# Patient Record
Sex: Female | Born: 1945
Health system: Southern US, Community
[De-identification: ages and names within clinical notes are randomized; demographics above are authoritative.]

## PROBLEM LIST (undated history)

## (undated) DIAGNOSIS — F0390 Unspecified dementia without behavioral disturbance: Secondary | ICD-10-CM

## (undated) DIAGNOSIS — R42 Dizziness and giddiness: Secondary | ICD-10-CM

## (undated) DIAGNOSIS — F419 Anxiety disorder, unspecified: Secondary | ICD-10-CM

## (undated) DIAGNOSIS — E7800 Pure hypercholesterolemia, unspecified: Secondary | ICD-10-CM

## (undated) HISTORY — DX: Unspecified dementia, unspecified severity, without behavioral disturbance, psychotic disturbance, mood disturbance, and anxiety: F03.90

## (undated) HISTORY — PX: TOTAL KNEE ARTHROPLASTY: SHX125

## (undated) HISTORY — PX: OTHER SURGICAL HISTORY: SHX169

## (undated) HISTORY — DX: Anxiety disorder, unspecified: F41.9

---

## 1998-02-16 ENCOUNTER — Emergency Department (HOSPITAL_COMMUNITY): Admission: EM | Admit: 1998-02-16 | Discharge: 1998-02-16 | Payer: Self-pay | Admitting: Emergency Medicine

## 1998-03-20 ENCOUNTER — Emergency Department (HOSPITAL_COMMUNITY): Admission: EM | Admit: 1998-03-20 | Discharge: 1998-03-20 | Payer: Self-pay | Admitting: Emergency Medicine

## 1999-12-11 ENCOUNTER — Emergency Department (HOSPITAL_COMMUNITY): Admission: EM | Admit: 1999-12-11 | Discharge: 1999-12-11 | Payer: Self-pay | Admitting: Emergency Medicine

## 2000-07-17 ENCOUNTER — Encounter: Payer: Self-pay | Admitting: Family Medicine

## 2000-07-17 ENCOUNTER — Encounter: Admission: RE | Admit: 2000-07-17 | Discharge: 2000-07-17 | Payer: Self-pay | Admitting: Family Medicine

## 2001-09-03 ENCOUNTER — Other Ambulatory Visit: Admission: RE | Admit: 2001-09-03 | Discharge: 2001-09-03 | Payer: Self-pay | Admitting: Family Medicine

## 2001-09-11 ENCOUNTER — Encounter: Payer: Self-pay | Admitting: Family Medicine

## 2001-09-11 ENCOUNTER — Encounter: Admission: RE | Admit: 2001-09-11 | Discharge: 2001-09-11 | Payer: Self-pay | Admitting: Family Medicine

## 2002-10-09 ENCOUNTER — Encounter: Admission: RE | Admit: 2002-10-09 | Discharge: 2002-10-09 | Payer: Self-pay | Admitting: Family Medicine

## 2002-10-09 ENCOUNTER — Encounter: Payer: Self-pay | Admitting: Family Medicine

## 2003-08-13 ENCOUNTER — Encounter: Admission: RE | Admit: 2003-08-13 | Discharge: 2003-08-13 | Payer: Self-pay | Admitting: Family Medicine

## 2003-10-17 ENCOUNTER — Encounter: Admission: RE | Admit: 2003-10-17 | Discharge: 2003-10-17 | Payer: Self-pay | Admitting: Family Medicine

## 2004-03-11 ENCOUNTER — Emergency Department (HOSPITAL_COMMUNITY): Admission: EM | Admit: 2004-03-11 | Discharge: 2004-03-11 | Payer: Self-pay | Admitting: Family Medicine

## 2004-10-01 ENCOUNTER — Other Ambulatory Visit: Admission: RE | Admit: 2004-10-01 | Discharge: 2004-10-01 | Payer: Self-pay | Admitting: Family Medicine

## 2004-10-25 ENCOUNTER — Encounter: Admission: RE | Admit: 2004-10-25 | Discharge: 2004-10-25 | Payer: Self-pay | Admitting: Family Medicine

## 2005-10-17 ENCOUNTER — Encounter: Admission: RE | Admit: 2005-10-17 | Discharge: 2005-10-17 | Payer: Self-pay | Admitting: Family Medicine

## 2005-10-31 ENCOUNTER — Encounter: Admission: RE | Admit: 2005-10-31 | Discharge: 2005-10-31 | Payer: Self-pay | Admitting: Family Medicine

## 2005-11-04 ENCOUNTER — Encounter: Admission: RE | Admit: 2005-11-04 | Discharge: 2005-11-04 | Payer: Self-pay | Admitting: Family Medicine

## 2005-11-14 ENCOUNTER — Encounter: Admission: RE | Admit: 2005-11-14 | Discharge: 2005-11-14 | Payer: Self-pay | Admitting: Family Medicine

## 2006-03-23 ENCOUNTER — Emergency Department (HOSPITAL_COMMUNITY): Admission: EM | Admit: 2006-03-23 | Discharge: 2006-03-23 | Payer: Self-pay | Admitting: Emergency Medicine

## 2006-11-13 ENCOUNTER — Encounter: Admission: RE | Admit: 2006-11-13 | Discharge: 2006-11-13 | Payer: Self-pay | Admitting: Family Medicine

## 2007-11-14 ENCOUNTER — Encounter: Admission: RE | Admit: 2007-11-14 | Discharge: 2007-11-14 | Payer: Self-pay | Admitting: Family Medicine

## 2008-05-08 ENCOUNTER — Encounter: Admission: RE | Admit: 2008-05-08 | Discharge: 2008-05-08 | Payer: Self-pay | Admitting: Family Medicine

## 2008-06-11 ENCOUNTER — Ambulatory Visit (HOSPITAL_COMMUNITY): Admission: RE | Admit: 2008-06-11 | Discharge: 2008-06-11 | Payer: Self-pay | Admitting: Gastroenterology

## 2008-10-20 ENCOUNTER — Other Ambulatory Visit: Admission: RE | Admit: 2008-10-20 | Discharge: 2008-10-20 | Payer: Self-pay | Admitting: Family Medicine

## 2008-11-24 ENCOUNTER — Emergency Department (HOSPITAL_COMMUNITY): Admission: EM | Admit: 2008-11-24 | Discharge: 2008-11-24 | Payer: Self-pay | Admitting: Emergency Medicine

## 2008-12-19 ENCOUNTER — Encounter: Admission: RE | Admit: 2008-12-19 | Discharge: 2008-12-19 | Payer: Self-pay | Admitting: Family Medicine

## 2009-03-03 ENCOUNTER — Encounter: Admission: RE | Admit: 2009-03-03 | Discharge: 2009-03-03 | Payer: Self-pay | Admitting: Family Medicine

## 2009-10-18 ENCOUNTER — Emergency Department (HOSPITAL_COMMUNITY): Admission: EM | Admit: 2009-10-18 | Discharge: 2009-10-18 | Payer: Self-pay | Admitting: Family Medicine

## 2009-10-21 ENCOUNTER — Other Ambulatory Visit: Admission: RE | Admit: 2009-10-21 | Discharge: 2009-10-21 | Payer: Self-pay | Admitting: Family Medicine

## 2010-02-05 ENCOUNTER — Inpatient Hospital Stay (HOSPITAL_COMMUNITY): Admission: RE | Admit: 2010-02-05 | Discharge: 2010-02-08 | Payer: Self-pay | Admitting: Orthopaedic Surgery

## 2010-05-19 ENCOUNTER — Encounter: Admission: RE | Admit: 2010-05-19 | Discharge: 2010-05-19 | Payer: Self-pay | Admitting: Family Medicine

## 2010-09-05 ENCOUNTER — Encounter: Payer: Self-pay | Admitting: Family Medicine

## 2010-09-05 ENCOUNTER — Encounter: Payer: Self-pay | Admitting: Gastroenterology

## 2010-10-31 LAB — BASIC METABOLIC PANEL
BUN: 11 mg/dL (ref 6–23)
BUN: 13 mg/dL (ref 6–23)
CO2: 23 mEq/L (ref 19–32)
CO2: 27 mEq/L (ref 19–32)
Calcium: 8.4 mg/dL (ref 8.4–10.5)
Calcium: 9.1 mg/dL (ref 8.4–10.5)
Chloride: 101 mEq/L (ref 96–112)
Chloride: 105 mEq/L (ref 96–112)
Creatinine, Ser: 1.25 mg/dL — ABNORMAL HIGH (ref 0.4–1.2)
Creatinine, Ser: 1.33 mg/dL — ABNORMAL HIGH (ref 0.4–1.2)
GFR calc Af Amer: 49 mL/min — ABNORMAL LOW (ref >60)
GFR calc Af Amer: 52 mL/min — ABNORMAL LOW (ref >60)
GFR calc non Af Amer: 40 mL/min — ABNORMAL LOW (ref >60)
GFR calc non Af Amer: 43 mL/min — ABNORMAL LOW (ref >60)
Glucose, Bld: 122 mg/dL — ABNORMAL HIGH (ref 70–99)
Glucose, Bld: 161 mg/dL — ABNORMAL HIGH (ref 70–99)
Potassium: 4.2 mEq/L (ref 3.5–5.1)
Potassium: 4.2 mEq/L (ref 3.5–5.1)
Sodium: 133 mEq/L — ABNORMAL LOW (ref 135–145)
Sodium: 137 mEq/L (ref 135–145)

## 2010-10-31 LAB — URINALYSIS, ROUTINE W REFLEX MICROSCOPIC
Bilirubin Urine: NEGATIVE
Glucose, UA: NEGATIVE mg/dL
Hgb urine dipstick: NEGATIVE
Ketones, ur: NEGATIVE mg/dL
Nitrite: NEGATIVE
Protein, ur: NEGATIVE mg/dL
Specific Gravity, Urine: 1.007 (ref 1.005–1.030)
Urobilinogen, UA: 0.2 mg/dL (ref 0.0–1.0)
pH: 7 (ref 5.0–8.0)

## 2010-10-31 LAB — COMPREHENSIVE METABOLIC PANEL
ALT: 30 U/L (ref 0–35)
AST: 26 U/L (ref 0–37)
Albumin: 4 g/dL (ref 3.5–5.2)
Alkaline Phosphatase: 80 U/L (ref 39–117)
BUN: 12 mg/dL (ref 6–23)
CO2: 31 mEq/L (ref 19–32)
Calcium: 10 mg/dL (ref 8.4–10.5)
Chloride: 107 mEq/L (ref 96–112)
Creatinine, Ser: 0.81 mg/dL (ref 0.4–1.2)
GFR calc Af Amer: >60 mL/min (ref >60)
GFR calc non Af Amer: >60 mL/min (ref >60)
Glucose, Bld: 94 mg/dL (ref 70–99)
Potassium: 4.1 mEq/L (ref 3.5–5.1)
Sodium: 141 mEq/L (ref 135–145)
Total Bilirubin: 0.6 mg/dL (ref 0.3–1.2)
Total Protein: 7.1 g/dL (ref 6.0–8.3)

## 2010-10-31 LAB — PROTIME-INR
INR: 0.92 (ref 0.00–1.49)
INR: 1.14 (ref 0.00–1.49)
INR: 1.34 (ref 0.00–1.49)
INR: 1.6 — ABNORMAL HIGH (ref 0.00–1.49)
Prothrombin Time: 12.3 seconds (ref 11.6–15.2)
Prothrombin Time: 14.5 seconds (ref 11.6–15.2)
Prothrombin Time: 16.5 seconds — ABNORMAL HIGH (ref 11.6–15.2)
Prothrombin Time: 18.9 seconds — ABNORMAL HIGH (ref 11.6–15.2)

## 2010-10-31 LAB — CBC
HCT: 27.4 % — ABNORMAL LOW (ref 36.0–46.0)
HCT: 32.3 % — ABNORMAL LOW (ref 36.0–46.0)
HCT: 41.5 % (ref 36.0–46.0)
Hemoglobin: 11.1 g/dL — ABNORMAL LOW (ref 12.0–15.0)
Hemoglobin: 14.2 g/dL (ref 12.0–15.0)
Hemoglobin: 9.4 g/dL — ABNORMAL LOW (ref 12.0–15.0)
MCH: 31.9 pg (ref 26.0–34.0)
MCH: 32.1 pg (ref 26.0–34.0)
MCHC: 34.2 g/dL (ref 30.0–36.0)
MCHC: 34.3 g/dL (ref 30.0–36.0)
MCHC: 34.5 g/dL (ref 30.0–36.0)
MCV: 92.2 fL (ref 78.0–100.0)
MCV: 93 fL (ref 78.0–100.0)
MCV: 93.2 fL (ref 78.0–100.0)
Platelets: 174 10*3/uL (ref 150–400)
Platelets: 224 10*3/uL (ref 150–400)
Platelets: 271 10*3/uL (ref 150–400)
RBC: 2.95 MIL/uL — ABNORMAL LOW (ref 3.87–5.11)
RBC: 3.47 MIL/uL — ABNORMAL LOW (ref 3.87–5.11)
RBC: 4.51 MIL/uL (ref 3.87–5.11)
RDW: 13 % (ref 11.5–15.5)
RDW: 13.2 % (ref 11.5–15.5)
RDW: 13.5 % (ref 11.5–15.5)
WBC: 10.4 10*3/uL (ref 4.0–10.5)
WBC: 11.5 10*3/uL — ABNORMAL HIGH (ref 4.0–10.5)
WBC: 5.4 10*3/uL (ref 4.0–10.5)

## 2010-10-31 LAB — DIFFERENTIAL
Basophils Absolute: 0 10*3/uL (ref 0.0–0.1)
Basophils Relative: 0 % (ref 0–1)
Eosinophils Absolute: 0.1 10*3/uL (ref 0.0–0.7)
Eosinophils Relative: 2 % (ref 0–5)
Lymphocytes Relative: 39 % (ref 12–46)
Lymphs Abs: 2.1 10*3/uL (ref 0.7–4.0)
Monocytes Absolute: 0.5 10*3/uL (ref 0.1–1.0)
Monocytes Relative: 9 % (ref 3–12)
Neutro Abs: 2.6 10*3/uL (ref 1.7–7.7)
Neutrophils Relative %: 49 % (ref 43–77)

## 2010-10-31 LAB — URINE MICROSCOPIC-ADD ON

## 2010-10-31 LAB — TYPE AND SCREEN
ABO/RH(D): A POS
Antibody Screen: NEGATIVE

## 2010-10-31 LAB — SURGICAL PCR SCREEN
MRSA, PCR: NEGATIVE
Staphylococcus aureus: POSITIVE — AB

## 2010-10-31 LAB — ABO/RH: ABO/RH(D): A POS

## 2010-11-24 LAB — COMPREHENSIVE METABOLIC PANEL
ALT: 31 U/L (ref 0–35)
AST: 24 U/L (ref 0–37)
Albumin: 3.8 g/dL (ref 3.5–5.2)
Alkaline Phosphatase: 56 U/L (ref 39–117)
BUN: 12 mg/dL (ref 6–23)
CO2: 27 mEq/L (ref 19–32)
Calcium: 9.5 mg/dL (ref 8.4–10.5)
Chloride: 106 mEq/L (ref 96–112)
Creatinine, Ser: 0.79 mg/dL (ref 0.4–1.2)
GFR calc Af Amer: >60 mL/min (ref >60)
GFR calc non Af Amer: >60 mL/min (ref >60)
Glucose, Bld: 104 mg/dL — ABNORMAL HIGH (ref 70–99)
Potassium: 3.3 mEq/L — ABNORMAL LOW (ref 3.5–5.1)
Sodium: 139 mEq/L (ref 135–145)
Total Bilirubin: 0.5 mg/dL (ref 0.3–1.2)
Total Protein: 6.8 g/dL (ref 6.0–8.3)

## 2010-11-24 LAB — URINALYSIS, ROUTINE W REFLEX MICROSCOPIC
Bilirubin Urine: NEGATIVE
Glucose, UA: NEGATIVE mg/dL
Hgb urine dipstick: NEGATIVE
Ketones, ur: NEGATIVE mg/dL
Nitrite: NEGATIVE
Protein, ur: NEGATIVE mg/dL
Specific Gravity, Urine: 1.028 (ref 1.005–1.030)
Urobilinogen, UA: 1 mg/dL (ref 0.0–1.0)
pH: 6 (ref 5.0–8.0)

## 2010-11-24 LAB — CBC
HCT: 38.5 % (ref 36.0–46.0)
Hemoglobin: 13.1 g/dL (ref 12.0–15.0)
MCHC: 34 g/dL (ref 30.0–36.0)
MCV: 93 fL (ref 78.0–100.0)
Platelets: 250 10*3/uL (ref 150–400)
RBC: 4.14 MIL/uL (ref 3.87–5.11)
RDW: 13 % (ref 11.5–15.5)
WBC: 9.2 10*3/uL (ref 4.0–10.5)

## 2010-11-24 LAB — DIFFERENTIAL
Basophils Absolute: 0 10*3/uL (ref 0.0–0.1)
Basophils Relative: 0 % (ref 0–1)
Eosinophils Absolute: 0.1 10*3/uL (ref 0.0–0.7)
Eosinophils Relative: 1 % (ref 0–5)
Lymphocytes Relative: 17 % (ref 12–46)
Lymphs Abs: 1.5 10*3/uL (ref 0.7–4.0)
Monocytes Absolute: 0.6 10*3/uL (ref 0.1–1.0)
Monocytes Relative: 6 % (ref 3–12)
Neutro Abs: 6.9 10*3/uL (ref 1.7–7.7)
Neutrophils Relative %: 76 % (ref 43–77)

## 2010-11-24 LAB — LIPASE, BLOOD: Lipase: 38 U/L (ref 11–59)

## 2011-01-11 ENCOUNTER — Emergency Department (HOSPITAL_COMMUNITY)
Admission: EM | Admit: 2011-01-11 | Discharge: 2011-01-11 | Disposition: A | Discharge status: Home / Self Care | Payer: Medicare Other | Attending: Emergency Medicine | Admitting: Emergency Medicine

## 2011-01-11 DIAGNOSIS — R0989 Other specified symptoms and signs involving the circulatory and respiratory systems: Secondary | ICD-10-CM | POA: Insufficient documentation

## 2011-01-11 DIAGNOSIS — R0609 Other forms of dyspnea: Secondary | ICD-10-CM | POA: Insufficient documentation

## 2011-01-11 DIAGNOSIS — F341 Dysthymic disorder: Secondary | ICD-10-CM | POA: Insufficient documentation

## 2011-01-11 DIAGNOSIS — R42 Dizziness and giddiness: Secondary | ICD-10-CM | POA: Insufficient documentation

## 2011-01-11 DIAGNOSIS — F410 Panic disorder [episodic paroxysmal anxiety] without agoraphobia: Secondary | ICD-10-CM | POA: Insufficient documentation

## 2011-01-11 DIAGNOSIS — K219 Gastro-esophageal reflux disease without esophagitis: Secondary | ICD-10-CM | POA: Insufficient documentation

## 2011-01-11 LAB — CK TOTAL AND CKMB (NOT AT ARMC)
CK, MB: 1.5 ng/mL (ref 0.3–4.0)
Relative Index: INVALID (ref 0.0–2.5)
Total CK: 61 U/L (ref 7–177)

## 2011-01-11 LAB — TROPONIN I: Troponin I: <0.3 ng/mL (ref <0.30)

## 2011-04-29 ENCOUNTER — Other Ambulatory Visit: Payer: Self-pay | Admitting: Family Medicine

## 2011-04-29 DIAGNOSIS — Z1231 Encounter for screening mammogram for malignant neoplasm of breast: Secondary | ICD-10-CM

## 2011-05-23 ENCOUNTER — Ambulatory Visit
Admission: RE | Admit: 2011-05-23 | Discharge: 2011-05-23 | Disposition: A | Discharge status: Home / Self Care | Payer: Medicare Other | Source: Ambulatory Visit | Attending: Family Medicine | Admitting: Family Medicine

## 2011-05-23 DIAGNOSIS — Z1231 Encounter for screening mammogram for malignant neoplasm of breast: Secondary | ICD-10-CM

## 2011-05-31 ENCOUNTER — Other Ambulatory Visit: Payer: Self-pay | Admitting: Family Medicine

## 2011-05-31 DIAGNOSIS — R928 Other abnormal and inconclusive findings on diagnostic imaging of breast: Secondary | ICD-10-CM

## 2011-06-07 ENCOUNTER — Ambulatory Visit
Admission: RE | Admit: 2011-06-07 | Discharge: 2011-06-07 | Disposition: A | Discharge status: Home / Self Care | Payer: Medicare Other | Source: Ambulatory Visit | Attending: Family Medicine | Admitting: Family Medicine

## 2011-06-07 DIAGNOSIS — R928 Other abnormal and inconclusive findings on diagnostic imaging of breast: Secondary | ICD-10-CM

## 2011-06-10 ENCOUNTER — Other Ambulatory Visit: Payer: Medicare Other

## 2012-03-22 ENCOUNTER — Encounter (HOSPITAL_COMMUNITY): Payer: Self-pay | Admitting: Emergency Medicine

## 2012-03-22 ENCOUNTER — Emergency Department (HOSPITAL_COMMUNITY)
Admission: EM | Admit: 2012-03-22 | Discharge: 2012-03-23 | Disposition: A | Discharge status: Home / Self Care | Payer: Medicare Other | Attending: Emergency Medicine | Admitting: Emergency Medicine

## 2012-03-22 DIAGNOSIS — R42 Dizziness and giddiness: Secondary | ICD-10-CM | POA: Insufficient documentation

## 2012-03-22 DIAGNOSIS — R1110 Vomiting, unspecified: Secondary | ICD-10-CM | POA: Insufficient documentation

## 2012-03-22 DIAGNOSIS — Z880 Allergy status to penicillin: Secondary | ICD-10-CM | POA: Insufficient documentation

## 2012-03-22 DIAGNOSIS — Z885 Allergy status to narcotic agent status: Secondary | ICD-10-CM | POA: Insufficient documentation

## 2012-03-22 HISTORY — DX: Dizziness and giddiness: R42

## 2012-03-22 HISTORY — DX: Pure hypercholesterolemia, unspecified: E78.00

## 2012-03-22 MED ORDER — ONDANSETRON HCL 4 MG/2ML IJ SOLN
INTRAMUSCULAR | Status: AC
  Administered 2012-03-22: 4 mg via INTRAVENOUS
  Filled 2012-03-22: qty 2

## 2012-03-22 MED ORDER — ONDANSETRON HCL 4 MG/2ML IJ SOLN
4.0000 mg | Freq: Once | INTRAMUSCULAR | Status: AC
  Administered 2012-03-22: 4 mg via INTRAVENOUS

## 2012-03-22 MED ORDER — ONDANSETRON HCL 4 MG/2ML IJ SOLN
INTRAMUSCULAR | Status: AC
  Administered 2012-03-22
  Filled 2012-03-22: qty 2

## 2012-03-22 NOTE — ED Notes (Signed)
Per EMS pt is c/o dizziness and vomiting  Pt states she woke up that way  Pt has hx of vertigo  Denies pain anywhere

## 2012-03-23 LAB — POCT I-STAT, CHEM 8
BUN: 15 mg/dL (ref 6–23)
Calcium, Ion: 1.29 mmol/L (ref 1.13–1.30)
Chloride: 108 mEq/L (ref 96–112)
Creatinine, Ser: 0.9 mg/dL (ref 0.50–1.10)
Glucose, Bld: 126 mg/dL — ABNORMAL HIGH (ref 70–99)
HCT: 36 % (ref 36.0–46.0)
Hemoglobin: 12.2 g/dL (ref 12.0–15.0)
Potassium: 3.4 mEq/L — ABNORMAL LOW (ref 3.5–5.1)
Sodium: 142 mEq/L (ref 135–145)
TCO2: 21 mmol/L (ref 0–100)

## 2012-03-23 LAB — CBC
HCT: 35.6 % — ABNORMAL LOW (ref 36.0–46.0)
Hemoglobin: 12.2 g/dL (ref 12.0–15.0)
MCH: 30.7 pg (ref 26.0–34.0)
MCHC: 34.3 g/dL (ref 30.0–36.0)
MCV: 89.7 fL (ref 78.0–100.0)
Platelets: 229 10*3/uL (ref 150–400)
RBC: 3.97 MIL/uL (ref 3.87–5.11)
RDW: 13.1 % (ref 11.5–15.5)
WBC: 8 10*3/uL (ref 4.0–10.5)

## 2012-03-23 MED ORDER — MECLIZINE HCL 25 MG PO TABS
25.0000 mg | ORAL_TABLET | Freq: Once | ORAL | Status: AC
  Administered 2012-03-23: 25 mg via ORAL
  Filled 2012-03-23: qty 1

## 2012-03-23 MED ORDER — LORAZEPAM 2 MG/ML IJ SOLN
1.0000 mg | Freq: Once | INTRAMUSCULAR | Status: AC
  Administered 2012-03-23: 1 mg via INTRAVENOUS
  Filled 2012-03-23: qty 1

## 2012-03-23 MED ORDER — POTASSIUM CHLORIDE CRYS ER 20 MEQ PO TBCR
20.0000 meq | EXTENDED_RELEASE_TABLET | Freq: Once | ORAL | Status: AC
  Administered 2012-03-23: 20 meq via ORAL
  Filled 2012-03-23: qty 1

## 2012-03-23 MED ORDER — MECLIZINE HCL 25 MG PO TABS: 25.0000 mg | ORAL_TABLET | Freq: Four times a day (QID) | ORAL | Status: AC

## 2012-03-23 MED ORDER — SODIUM CHLORIDE 0.9 % IV SOLN
INTRAVENOUS | Status: DC
  Administered 2012-03-23 (×2): via INTRAVENOUS

## 2012-03-23 MED ORDER — ONDANSETRON HCL 4 MG/2ML IJ SOLN
4.0000 mg | Freq: Once | INTRAMUSCULAR | Status: AC
  Administered 2012-03-23: 4 mg via INTRAVENOUS
  Filled 2012-03-23: qty 2

## 2012-03-23 NOTE — ED Provider Notes (Signed)
History     CSN: [id number]  Arrival date & time 03/22/12  2316   First MD Initiated Contact with Patient 03/23/12 0004      Chief Complaint  Patient presents with  . Dizziness  . Emesis    (Consider location/radiation/quality/duration/timing/severity/associated sxs/prior treatment) HPI  Hx per PT, got up to use the restroom and had sudden onset severe dizzieness and associated N/V. No HA. No weakness or numbness. No F/C. No recent illness, no neck pain. H/o same a few months ago and Dx with vertigo. Non smoker, no h/o HTN and no h/o CVA. No known alleviating symptoms.  Past Medical History  Diagnosis Date  . Vertigo   . High cholesterol     No past surgical history on file.  No family history on file.  History  Substance Use Topics  . Smoking status: Not on file  . Smokeless tobacco: Not on file  . Alcohol Use:     OB History    Grav Para Term Preterm Abortions TAB SAB Ect Mult Living                  Review of Systems  Constitutional: Negative for fever and chills.  HENT: Negative for neck pain and neck stiffness.   Eyes: Negative for pain.  Respiratory: Negative for shortness of breath.   Cardiovascular: Negative for chest pain.  Gastrointestinal: Positive for nausea and vomiting. Negative for abdominal pain.  Genitourinary: Negative for dysuria.  Musculoskeletal: Negative for back pain.  Skin: Negative for rash.  Neurological: Positive for dizziness. Negative for headaches.  All other systems reviewed and are negative.    Allergies  Azithromycin; Codeine; and Penicillins  Home Medications   Current Outpatient Rx  Name Route Sig Dispense Refill  . ALPRAZOLAM 0.5 MG PO TABS Oral Take 1 mg by mouth at bedtime as needed. For sleep    . LOVASTATIN 40 MG PO TABS Oral Take 80 mg by mouth daily.      BP 128/75  Pulse 74  Temp 98.4 F (36.9 C) (Oral)  Resp 21  SpO2 97%  Physical Exam  Constitutional: She is oriented to person, place, and time. She  appears well-developed and well-nourished.  HENT:  Head: Normocephalic and atraumatic.  Eyes: Conjunctivae and EOM are normal. Pupils are equal, round, and reactive to light.  Neck: Full passive range of motion without pain. Neck supple. No thyromegaly present.       No meningismus  Cardiovascular: Normal rate, regular rhythm, S1 normal, S2 normal and intact distal pulses.   Pulmonary/Chest: Effort normal and breath sounds normal.  Abdominal: Soft. Bowel sounds are normal. There is no tenderness. There is no CVA tenderness.  Musculoskeletal: Normal range of motion.  Neurological: She is alert and oriented to person, place, and time. She has normal strength and normal reflexes. No cranial nerve deficit or sensory deficit. She displays a negative Romberg sign. GCS eye subscore is 4. GCS verbal subscore is 5. GCS motor subscore is 6.       Lateral nystagmus present  Skin: Skin is warm and dry. No rash noted. No cyanosis. Nails show no clubbing.  Psychiatric: She has a normal mood and affect. Her speech is normal and behavior is normal.    ED Course  Procedures (including critical care time)  Results for orders placed during the hospital encounter of 03/22/12  CBC      Component Value Range   WBC 8.0  4.0 - 10.5 K/uL  RBC 3.97  3.87 - 5.11 MIL/uL   Hemoglobin 12.2  12.0 - 15.0 g/dL   HCT 16.1 (*) 09.6 - 04.5 %   MCV 89.7  78.0 - 100.0 fL   MCH 30.7  26.0 - 34.0 pg   MCHC 34.3  30.0 - 36.0 g/dL   RDW 40.9  81.1 - 91.4 %   Platelets 229  150 - 400 K/uL  POCT I-STAT, CHEM 8      Component Value Range   Sodium 142  135 - 145 mEq/L   Potassium 3.4 (*) 3.5 - 5.1 mEq/L   Chloride 108  96 - 112 mEq/L   BUN 15  6 - 23 mg/dL   Creatinine, Ser 7.82  0.50 - 1.10 mg/dL   Glucose, Bld 956 (*) 70 - 99 mg/dL   Calcium, Ion 2.13  0.86 - 1.30 mmol/L   TCO2 21  0 - 100 mmol/L   Hemoglobin 12.2  12.0 - 15.0 g/dL   HCT 57.8  46.9 - 62.9 %   IVFs. IV ativan and zofran.  antivert PO.   2:32 AM  recheck significantly better, tolerates POs, ambulates to the bathroom. No further symptoms, repeat exam normal neuro exam.   PT requesting to be discharged home, feels much better. Given normal neuro exam and symptoms resolved, and no risk factors for stroke, do not feel PT requires admit for the same at this time. She has close PCP follow up, is a reliable historian and agree to all d/c and f/u instructions, as well as, strict return precautions.  MDM   Nursing notes reviewed, VS reviewed. IVFs and medications and improved condition with serial evaluations.         Sunnie Nielsen, MD 03/23/12 [phone]

## 2012-03-23 NOTE — ED Notes (Signed)
Pt ambulated without difficulty, able to tolerate PO fluids.

## 2012-04-14 ENCOUNTER — Emergency Department (HOSPITAL_COMMUNITY): Payer: Medicare Other

## 2012-04-14 ENCOUNTER — Emergency Department (HOSPITAL_COMMUNITY)
Admission: EM | Admit: 2012-04-14 | Discharge: 2012-04-14 | Disposition: A | Discharge status: Home / Self Care | Payer: Medicare Other | Attending: Emergency Medicine | Admitting: Emergency Medicine

## 2012-04-14 ENCOUNTER — Encounter (HOSPITAL_COMMUNITY): Payer: Self-pay | Admitting: *Deleted

## 2012-04-14 DIAGNOSIS — G479 Sleep disorder, unspecified: Secondary | ICD-10-CM | POA: Insufficient documentation

## 2012-04-14 DIAGNOSIS — R0981 Nasal congestion: Secondary | ICD-10-CM

## 2012-04-14 DIAGNOSIS — E7800 Pure hypercholesterolemia, unspecified: Secondary | ICD-10-CM | POA: Insufficient documentation

## 2012-04-14 DIAGNOSIS — J3489 Other specified disorders of nose and nasal sinuses: Secondary | ICD-10-CM | POA: Insufficient documentation

## 2012-04-14 DIAGNOSIS — R1110 Vomiting, unspecified: Secondary | ICD-10-CM | POA: Insufficient documentation

## 2012-04-14 DIAGNOSIS — R0982 Postnasal drip: Secondary | ICD-10-CM | POA: Insufficient documentation

## 2012-04-14 LAB — CBC
HCT: 38.7 % (ref 36.0–46.0)
Hemoglobin: 13.4 g/dL (ref 12.0–15.0)
MCH: 31 pg (ref 26.0–34.0)
MCHC: 34.6 g/dL (ref 30.0–36.0)
MCV: 89.6 fL (ref 78.0–100.0)
Platelets: 310 10*3/uL (ref 150–400)
RBC: 4.32 MIL/uL (ref 3.87–5.11)
RDW: 13 % (ref 11.5–15.5)
WBC: 6.3 10*3/uL (ref 4.0–10.5)

## 2012-04-14 LAB — BASIC METABOLIC PANEL
BUN: 12 mg/dL (ref 6–23)
CO2: 23 mEq/L (ref 19–32)
Calcium: 9.6 mg/dL (ref 8.4–10.5)
Chloride: 106 mEq/L (ref 96–112)
Creatinine, Ser: 0.94 mg/dL (ref 0.50–1.10)
GFR calc Af Amer: 72 mL/min — ABNORMAL LOW (ref >90)
GFR calc non Af Amer: 62 mL/min — ABNORMAL LOW (ref >90)
Glucose, Bld: 102 mg/dL — ABNORMAL HIGH (ref 70–99)
Potassium: 3.6 mEq/L (ref 3.5–5.1)
Sodium: 140 mEq/L (ref 135–145)

## 2012-04-14 MED ORDER — LORAZEPAM 2 MG/ML IJ SOLN
1.0000 mg | Freq: Once | INTRAMUSCULAR | Status: AC
  Administered 2012-04-14: 1 mg via INTRAVENOUS
  Filled 2012-04-14: qty 1

## 2012-04-14 MED ORDER — OXYMETAZOLINE HCL 0.05 % NA SOLN
1.0000 | Freq: Once | NASAL | Status: AC
  Administered 2012-04-14: 1 via NASAL
  Filled 2012-04-14: qty 15

## 2012-04-14 MED ORDER — CETIRIZINE HCL 5 MG/5ML PO SYRP
5.0000 mg | ORAL_SOLUTION | Freq: Once | ORAL | Status: AC
  Administered 2012-04-14: 5 mg via ORAL
  Filled 2012-04-14: qty 5

## 2012-04-14 MED ORDER — MECLIZINE HCL 25 MG PO TABS
ORAL_TABLET | ORAL | Status: AC
  Filled 2012-04-14: qty 1

## 2012-04-14 MED ORDER — CETIRIZINE-PSEUDOEPHEDRINE ER 5-120 MG PO TB12: 1.0000 | ORAL_TABLET | Freq: Every day | ORAL | Status: AC

## 2012-04-14 MED ORDER — MECLIZINE HCL 25 MG PO TABS
50.0000 mg | ORAL_TABLET | Freq: Once | ORAL | Status: AC
  Administered 2012-04-14: 50 mg via ORAL
  Filled 2012-04-14: qty 1

## 2012-04-14 MED ORDER — SODIUM CHLORIDE 0.9 % IV SOLN
INTRAVENOUS | Status: DC
  Administered 2012-04-14 (×2): via INTRAVENOUS

## 2012-04-14 MED ORDER — OXYMETAZOLINE HCL 0.05 % NA SOLN: 2.0000 | Freq: Two times a day (BID) | NASAL | Status: AC

## 2012-04-14 MED ORDER — PROMETHAZINE HCL 25 MG PO TABS: 25.0000 mg | ORAL_TABLET | Freq: Four times a day (QID) | ORAL | Status: DC | PRN

## 2012-04-14 NOTE — ED Provider Notes (Signed)
History     CSN: [id number]  Arrival date & time 04/14/12  0411   First MD Initiated Contact with Patient 04/14/12 0502      Chief Complaint  Patient presents with  . Nausea  . Emesis    (Consider location/radiation/quality/duration/timing/severity/associated sxs/prior treatment) HPI HX per PT. Congestion associated vomiting, worse at night time, wakes up gaging and ends up vomiting.  Evaluated here about 3 weeks ago and at that time having dizzieness and vertigo.  That has resolved and now has these new symptoms, she is taking nasal steroids, was prescribed doxycycline but does not think it is working. No h/o allergies, no sick contacts. No cough or SOB. No weakness or numbness. Having trouble sleeping 2/2 symptoms, is scheduled to see ENT in 4 days.  Past Medical History  Diagnosis Date  . Vertigo   . High cholesterol     No past surgical history on file.  No family history on file.  History  Substance Use Topics  . Smoking status: Never Smoker   . Smokeless tobacco: Never Used  . Alcohol Use: Yes    OB History    Grav Para Term Preterm Abortions TAB SAB Ect Mult Living                  Review of Systems  Constitutional: Negative for fever and chills.  HENT: Positive for congestion and postnasal drip. Negative for sneezing, neck pain, neck stiffness and sinus pressure.   Eyes: Negative for pain.  Respiratory: Negative for shortness of breath.   Cardiovascular: Negative for chest pain.  Gastrointestinal: Positive for vomiting. Negative for abdominal pain.  Genitourinary: Negative for dysuria.  Musculoskeletal: Negative for back pain.  Skin: Negative for rash.  Neurological: Negative for headaches.  All other systems reviewed and are negative.    Allergies  Azithromycin; Codeine; Doxycycline hyclate; Penicillins; and Trazodone and nefazodone  Home Medications   Current Outpatient Rx  Name Route Sig Dispense Refill  . LOVASTATIN 40 MG PO TABS Oral Take 80  mg by mouth daily.    Marland Kitchen ONDANSETRON HCL 8 MG PO TABS Oral Take 8 mg by mouth every 8 (eight) hours as needed. For nausea      BP 138/108  Pulse 101  Temp 98.5 F (36.9 C) (Oral)  Resp 20  SpO2 96%  Physical Exam  Constitutional: She is oriented to person, place, and time. She appears well-developed and well-nourished.  HENT:  Head: Normocephalic and atraumatic.       Nasal congestion and PND. No sinus tenderness.   Eyes: Conjunctivae and EOM are normal. Pupils are equal, round, and reactive to light.  Neck: Neck supple. Tracheal deviation present.  Cardiovascular: Normal rate, regular rhythm, S1 normal, S2 normal and normal pulses.     No systolic murmur is present   No diastolic murmur is present  Pulses:      Radial pulses are 2+ on the right side, and 2+ on the left side.  Pulmonary/Chest: Effort normal and breath sounds normal. No stridor. She has no wheezes. She has no rhonchi. She has no rales. She exhibits no tenderness.  Abdominal: Soft. Normal appearance and bowel sounds are normal. There is no tenderness. There is no CVA tenderness and negative Murphy's sign.  Musculoskeletal:       BLE:s Calves nontender, no cords or erythema, negative Homans sign  Neurological: She is alert and oriented to person, place, and time. She has normal strength. No cranial nerve deficit or sensory  deficit. GCS eye subscore is 4. GCS verbal subscore is 5. GCS motor subscore is 6.  Skin: Skin is warm and dry. No rash noted. She is not diaphoretic.  Psychiatric: Her speech is normal.       Cooperative and appropriate    ED Course  Procedures (including critical care time)  Results for orders placed during the hospital encounter of 04/14/12  CBC      Component Value Range   WBC 6.3  4.0 - 10.5 K/uL   RBC 4.32  3.87 - 5.11 MIL/uL   Hemoglobin 13.4  12.0 - 15.0 g/dL   HCT 16.1  09.6 - 04.5 %   MCV 89.6  78.0 - 100.0 fL   MCH 31.0  26.0 - 34.0 pg   MCHC 34.6  30.0 - 36.0 g/dL   RDW 40.9   81.1 - 91.4 %   Platelets 310  150 - 400 K/uL  BASIC METABOLIC PANEL      Component Value Range   Sodium 140  135 - 145 mEq/L   Potassium 3.6  3.5 - 5.1 mEq/L   Chloride 106  96 - 112 mEq/L   CO2 23  19 - 32 mEq/L   Glucose, Bld 102 (*) 70 - 99 mg/dL   BUN 12  6 - 23 mg/dL   Creatinine, Ser 7.82  0.50 - 1.10 mg/dL   Calcium 9.6  8.4 - 95.6 mg/dL   GFR calc non Af Amer 62 (*) >90 mL/min   GFR calc Af Amer 72 (*) >90 mL/min   Ct Head Wo Contrast  04/14/2012  *RADIOLOGY REPORT*  Clinical Data: Nausea.  Emesis.  CT HEAD WITHOUT CONTRAST  Technique:  Contiguous axial images were obtained from the base of the skull through the vertex without contrast.  Comparison: 12/19/2008  Findings: The brain has a normal appearance without evidence for hemorrhage, infarction, hydrocephalus, or mass lesion.  There is no extra axial fluid collection.  The skull and paranasal sinuses are normal.  IMPRESSION: Negative exam.   Original Report Authenticated By: Rosealee Albee, M.D.      Presentation suggests allergies - initialy had vertigo now congestion, PND and emesis. No longer dizzy. No sinusitis on CT scan. Labs reviewed no leukocytosis or electrolyte abnormality.   Zyrtec and afrin provided - plan ENT follow up as scheduled.   MDM   VS and nursing notes reviewed. Old records reviewed. Labs and imaging reviewed as above. Medications and Rx provided.         Sunnie Nielsen, MD 04/14/12 [phone]

## 2012-04-14 NOTE — ED Notes (Signed)
Patient transported to CT 

## 2012-04-14 NOTE — ED Notes (Signed)
Pt c/o persistent n/v for three and half weeks.

## 2012-05-04 ENCOUNTER — Other Ambulatory Visit: Payer: Self-pay | Admitting: Family Medicine

## 2012-05-04 DIAGNOSIS — R1011 Right upper quadrant pain: Secondary | ICD-10-CM

## 2012-05-08 ENCOUNTER — Other Ambulatory Visit: Payer: Self-pay | Admitting: Family Medicine

## 2012-05-08 DIAGNOSIS — Z1231 Encounter for screening mammogram for malignant neoplasm of breast: Secondary | ICD-10-CM

## 2012-05-14 ENCOUNTER — Ambulatory Visit
Admission: RE | Admit: 2012-05-14 | Discharge: 2012-05-14 | Disposition: A | Discharge status: Home / Self Care | Payer: Medicare Other | Source: Ambulatory Visit | Attending: Family Medicine | Admitting: Family Medicine

## 2012-05-14 DIAGNOSIS — R1011 Right upper quadrant pain: Secondary | ICD-10-CM

## 2012-05-21 ENCOUNTER — Ambulatory Visit: Payer: Medicare Other

## 2012-05-25 ENCOUNTER — Ambulatory Visit: Payer: Medicare Other

## 2012-05-25 ENCOUNTER — Ambulatory Visit
Admission: RE | Admit: 2012-05-25 | Discharge: 2012-05-25 | Disposition: A | Discharge status: Home / Self Care | Payer: Medicare Other | Source: Ambulatory Visit | Attending: Family Medicine | Admitting: Family Medicine

## 2012-05-25 DIAGNOSIS — Z1231 Encounter for screening mammogram for malignant neoplasm of breast: Secondary | ICD-10-CM

## 2013-05-13 ENCOUNTER — Other Ambulatory Visit: Payer: Self-pay

## 2013-05-13 DIAGNOSIS — Z1231 Encounter for screening mammogram for malignant neoplasm of breast: Secondary | ICD-10-CM

## 2013-05-30 ENCOUNTER — Ambulatory Visit
Admission: RE | Admit: 2013-05-30 | Discharge: 2013-05-30 | Disposition: A | Discharge status: Home / Self Care | Payer: Medicare Other | Source: Ambulatory Visit

## 2013-05-30 DIAGNOSIS — Z1231 Encounter for screening mammogram for malignant neoplasm of breast: Secondary | ICD-10-CM

## 2013-06-04 ENCOUNTER — Other Ambulatory Visit: Payer: Self-pay | Admitting: Family Medicine

## 2013-06-04 DIAGNOSIS — R928 Other abnormal and inconclusive findings on diagnostic imaging of breast: Secondary | ICD-10-CM

## 2013-06-12 ENCOUNTER — Other Ambulatory Visit: Payer: Self-pay | Admitting: Gastroenterology

## 2013-06-19 ENCOUNTER — Ambulatory Visit
Admission: RE | Admit: 2013-06-19 | Discharge: 2013-06-19 | Disposition: A | Discharge status: Home / Self Care | Payer: Medicare Other | Source: Ambulatory Visit | Attending: Family Medicine | Admitting: Family Medicine

## 2013-06-19 DIAGNOSIS — R928 Other abnormal and inconclusive findings on diagnostic imaging of breast: Secondary | ICD-10-CM

## 2013-08-04 ENCOUNTER — Encounter (HOSPITAL_COMMUNITY): Payer: Self-pay | Admitting: Emergency Medicine

## 2013-08-04 ENCOUNTER — Emergency Department (HOSPITAL_COMMUNITY)
Admission: EM | Admit: 2013-08-04 | Discharge: 2013-08-04 | Disposition: A | Discharge status: Home / Self Care | Payer: Medicare Other | Attending: Emergency Medicine | Admitting: Emergency Medicine

## 2013-08-04 DIAGNOSIS — H8110 Benign paroxysmal vertigo, unspecified ear: Secondary | ICD-10-CM

## 2013-08-04 DIAGNOSIS — E7800 Pure hypercholesterolemia, unspecified: Secondary | ICD-10-CM | POA: Insufficient documentation

## 2013-08-04 DIAGNOSIS — Z880 Allergy status to penicillin: Secondary | ICD-10-CM | POA: Insufficient documentation

## 2013-08-04 DIAGNOSIS — R110 Nausea: Secondary | ICD-10-CM | POA: Insufficient documentation

## 2013-08-04 DIAGNOSIS — Z79899 Other long term (current) drug therapy: Secondary | ICD-10-CM | POA: Insufficient documentation

## 2013-08-04 DIAGNOSIS — R279 Unspecified lack of coordination: Secondary | ICD-10-CM | POA: Insufficient documentation

## 2013-08-04 LAB — BASIC METABOLIC PANEL
BUN: 13 mg/dL (ref 6–23)
CO2: 25 mEq/L (ref 19–32)
Calcium: 9.6 mg/dL (ref 8.4–10.5)
Chloride: 109 mEq/L (ref 96–112)
Creatinine, Ser: 0.84 mg/dL (ref 0.50–1.10)
GFR calc Af Amer: 82 mL/min — ABNORMAL LOW (ref >90)
GFR calc non Af Amer: 70 mL/min — ABNORMAL LOW (ref >90)
Glucose, Bld: 105 mg/dL — ABNORMAL HIGH (ref 70–99)
Potassium: 3.9 mEq/L (ref 3.5–5.1)
Sodium: 141 mEq/L (ref 135–145)

## 2013-08-04 LAB — CBC WITH DIFFERENTIAL/PLATELET
Basophils Absolute: 0 10*3/uL (ref 0.0–0.1)
Basophils Relative: 0 % (ref 0–1)
Eosinophils Absolute: 0 10*3/uL (ref 0.0–0.7)
Eosinophils Relative: 0 % (ref 0–5)
HCT: 36.6 % (ref 36.0–46.0)
Hemoglobin: 12.4 g/dL (ref 12.0–15.0)
Lymphocytes Relative: 10 % — ABNORMAL LOW (ref 12–46)
Lymphs Abs: 0.9 10*3/uL (ref 0.7–4.0)
MCH: 30.5 pg (ref 26.0–34.0)
MCHC: 33.9 g/dL (ref 30.0–36.0)
MCV: 89.9 fL (ref 78.0–100.0)
Monocytes Absolute: 0.5 10*3/uL (ref 0.1–1.0)
Monocytes Relative: 6 % (ref 3–12)
Neutro Abs: 7.3 10*3/uL (ref 1.7–7.7)
Neutrophils Relative %: 84 % — ABNORMAL HIGH (ref 43–77)
Platelets: 269 10*3/uL (ref 150–400)
RBC: 4.07 MIL/uL (ref 3.87–5.11)
RDW: 13.4 % (ref 11.5–15.5)
WBC: 8.8 10*3/uL (ref 4.0–10.5)

## 2013-08-04 MED ORDER — ONDANSETRON HCL 8 MG PO TABS: 8.0000 mg | ORAL_TABLET | Freq: Three times a day (TID) | ORAL | Status: DC | PRN

## 2013-08-04 MED ORDER — SODIUM CHLORIDE 0.9 % IV BOLUS (SEPSIS)
500.0000 mL | Freq: Once | INTRAVENOUS | Status: AC
  Administered 2013-08-04: 500 mL via INTRAVENOUS

## 2013-08-04 MED ORDER — LORAZEPAM 2 MG/ML IJ SOLN
0.5000 mg | Freq: Once | INTRAMUSCULAR | Status: DC
  Filled 2013-08-04: qty 1

## 2013-08-04 NOTE — ED Provider Notes (Signed)
CSN: [id number]     Arrival date & time 08/04/13  0319 History   First MD Initiated Contact with Patient 08/04/13 0430     Chief Complaint  Patient presents with  . Dizziness  . Nausea   (Consider location/radiation/quality/duration/timing/severity/associated sxs/prior Treatment) HPI History provided by pt.   Pt woke between 1 and 2am today w/ severe, room-spinning dizziness.  Associated w/ ataxia and N/V.  Resolved spontaneously in ~1hr.  Sx occurred at rest.  Has had similar and more severe episodes in the past.  Has medication for dizziness and nausea at home but her husband was unable to find it.  No recent illness.  No recent head trauma.  No recent medication changes.  H/o high cholesterol only.  Past Medical History  Diagnosis Date  . Vertigo   . High cholesterol    Past Surgical History  Procedure Laterality Date  . Total knee arthroplasty Right    No family history on file. History  Substance Use Topics  . Smoking status: Never Smoker   . Smokeless tobacco: Never Used  . Alcohol Use: Yes   OB History   Grav Para Term Preterm Abortions TAB SAB Ect Mult Living                 Review of Systems  All other systems reviewed and are negative.    Allergies  Allegra; Azithromycin; Codeine; Doxycycline hyclate; Penicillins; Trazodone and nefazodone; and Zyrtec  Home Medications   Current Outpatient Rx  Name  Route  Sig  Dispense  Refill  . ALPRAZolam (XANAX) 0.5 MG tablet   Oral   Take 0.5 mg by mouth at bedtime as needed for sleep.         Marland Kitchen lovastatin (MEVACOR) 40 MG tablet   Oral   Take 80 mg by mouth every evening.           BP 144/78  Pulse 79  Temp(Src) 97.7 F (36.5 C) (Oral)  Resp 16  SpO2 96% Physical Exam  Nursing note and vitals reviewed. Constitutional: She is oriented to person, place, and time. She appears well-developed and well-nourished. No distress.  HENT:  Head: Normocephalic and atraumatic.  No cerumen impaction.  Nml TM  bilaterally  Eyes:  Normal appearance  Neck: Normal range of motion.  Cardiovascular: Normal rate, regular rhythm and intact distal pulses.   Pulmonary/Chest: Effort normal and breath sounds normal.  Musculoskeletal: Normal range of motion.  Neurological: She is alert and oriented to person, place, and time. She displays no tremor. No sensory deficit. She displays a negative Romberg sign. Coordination normal.  CN 3-12 intact.  5/5 and equal upper and lower extremity strength.  No past pointing.  No pronator drift.  No nystagmus.  No dizziness reported at rest.  Nml gait but reports dizziness w/ ambulation.  Resolves w/in 20 seconds of sitting down.   Skin: Skin is warm and dry. No rash noted.  Psychiatric: She has a normal mood and affect. Her behavior is normal.    ED Course  Procedures (including critical care time) Labs Review Labs Reviewed  CBC WITH DIFFERENTIAL - Abnormal; Notable for the following:    Neutrophils Relative % 84 (*)    Lymphocytes Relative 10 (*)    All other components within normal limits  BASIC METABOLIC PANEL - Abnormal; Notable for the following:    Glucose, Bld 105 (*)    GFR calc non Af Amer 70 (*)    GFR calc Af Amer 82 (*)  All other components within normal limits   Imaging Review No results found.  EKG Interpretation   None       MDM   1. Benign paroxysmal positional vertigo    67yo F presents w/ constant dizziness, vomiting and ataxia that started upon waking to use restroom this morning and resolved spontaneously in ~1hr.   H/o vertigo and these sx are typical, maybe even less severe.  Currently asx.  On exam, afebrile, NAD, mildly dehydrated, nml ears, no focal neuro deficits.  Pt became dizzy upon standing and it resolved w/in 20 seconds of rest.  History and exam most consistent w/ BPPV.  Most recent episode >51yr ago.  Labs pending.  Pt to receive NS bolus and 0.5mg  IV ativan for nausea and dizziness. 5:49 AM   Pt refused the  ativan because she believes it causes her to hallucinate.  She has received her fluids, has ambulated back and forth to restroom independently, and is requesting discharge.  Labs unremarkable.  Discussed case w/ Dr. Norlene Campbell and she is comfortable with me sending her home.  She has medication for dizziness and I have prescribed zofran.  Return precautions discussed.  6:14 AM    Otilio Miu, PA-C 08/04/13 [phone]

## 2013-08-04 NOTE — ED Provider Notes (Signed)
Medical screening examination/treatment/procedure(s) were performed by non-physician practitioner and as supervising physician I was immediately available for consultation/collaboration.    Dmya Long M Wilberta Dorvil, MD 08/04/13 0714 

## 2013-08-04 NOTE — ED Notes (Signed)
Per EMS pt found sitting in recliner c/o vertigo episode.  Pt stated she has not had any issues w/ vertigo in years and does not take any medications for it.   Denies pain, VSS.  +nausea 4 IV zofran given en route.

## 2013-11-18 ENCOUNTER — Other Ambulatory Visit: Payer: Self-pay | Admitting: Family Medicine

## 2013-11-18 DIAGNOSIS — N6489 Other specified disorders of breast: Secondary | ICD-10-CM

## 2013-11-18 DIAGNOSIS — M810 Age-related osteoporosis without current pathological fracture: Secondary | ICD-10-CM

## 2013-12-25 ENCOUNTER — Encounter (INDEPENDENT_AMBULATORY_CARE_PROVIDER_SITE_OTHER): Payer: Self-pay

## 2013-12-25 ENCOUNTER — Ambulatory Visit
Admission: RE | Admit: 2013-12-25 | Discharge: 2013-12-25 | Disposition: A | Discharge status: Home / Self Care | Payer: Medicare Other | Source: Ambulatory Visit | Attending: Family Medicine | Admitting: Family Medicine

## 2013-12-25 ENCOUNTER — Other Ambulatory Visit: Payer: Self-pay | Admitting: Family Medicine

## 2013-12-25 DIAGNOSIS — M810 Age-related osteoporosis without current pathological fracture: Secondary | ICD-10-CM

## 2013-12-25 DIAGNOSIS — M8580 Other specified disorders of bone density and structure, unspecified site: Secondary | ICD-10-CM

## 2013-12-25 DIAGNOSIS — N6489 Other specified disorders of breast: Secondary | ICD-10-CM

## 2014-03-31 ENCOUNTER — Other Ambulatory Visit: Payer: Self-pay | Admitting: Dermatology

## 2014-05-30 ENCOUNTER — Other Ambulatory Visit: Payer: Self-pay | Admitting: Family Medicine

## 2014-05-30 DIAGNOSIS — N6489 Other specified disorders of breast: Secondary | ICD-10-CM

## 2014-07-03 ENCOUNTER — Encounter (INDEPENDENT_AMBULATORY_CARE_PROVIDER_SITE_OTHER): Payer: Self-pay

## 2014-07-03 ENCOUNTER — Ambulatory Visit
Admission: RE | Admit: 2014-07-03 | Discharge: 2014-07-03 | Disposition: A | Discharge status: Home / Self Care | Payer: Medicare Other | Source: Ambulatory Visit | Attending: Family Medicine | Admitting: Family Medicine

## 2014-07-03 DIAGNOSIS — N6489 Other specified disorders of breast: Secondary | ICD-10-CM

## 2015-06-01 ENCOUNTER — Other Ambulatory Visit: Payer: Self-pay

## 2015-06-01 DIAGNOSIS — Z1231 Encounter for screening mammogram for malignant neoplasm of breast: Secondary | ICD-10-CM

## 2015-07-06 ENCOUNTER — Ambulatory Visit
Admission: RE | Admit: 2015-07-06 | Discharge: 2015-07-06 | Disposition: A | Discharge status: Home / Self Care | Payer: PPO | Source: Ambulatory Visit

## 2015-07-06 DIAGNOSIS — Z1231 Encounter for screening mammogram for malignant neoplasm of breast: Secondary | ICD-10-CM

## 2015-08-15 ENCOUNTER — Emergency Department (HOSPITAL_COMMUNITY)
Admission: EM | Admit: 2015-08-15 | Discharge: 2015-08-15 | Disposition: A | Discharge status: Home / Self Care | Payer: PPO | Attending: Emergency Medicine | Admitting: Emergency Medicine

## 2015-08-15 ENCOUNTER — Encounter (HOSPITAL_COMMUNITY): Payer: Self-pay | Admitting: *Deleted

## 2015-08-15 ENCOUNTER — Emergency Department (HOSPITAL_COMMUNITY): Payer: PPO

## 2015-08-15 DIAGNOSIS — Y9389 Activity, other specified: Secondary | ICD-10-CM | POA: Diagnosis not present

## 2015-08-15 DIAGNOSIS — Z880 Allergy status to penicillin: Secondary | ICD-10-CM | POA: Insufficient documentation

## 2015-08-15 DIAGNOSIS — W228XXA Striking against or struck by other objects, initial encounter: Secondary | ICD-10-CM | POA: Diagnosis not present

## 2015-08-15 DIAGNOSIS — Y92009 Unspecified place in unspecified non-institutional (private) residence as the place of occurrence of the external cause: Secondary | ICD-10-CM | POA: Insufficient documentation

## 2015-08-15 DIAGNOSIS — Z79899 Other long term (current) drug therapy: Secondary | ICD-10-CM | POA: Diagnosis not present

## 2015-08-15 DIAGNOSIS — E7800 Pure hypercholesterolemia, unspecified: Secondary | ICD-10-CM | POA: Insufficient documentation

## 2015-08-15 DIAGNOSIS — S6991XA Unspecified injury of right wrist, hand and finger(s), initial encounter: Secondary | ICD-10-CM | POA: Diagnosis not present

## 2015-08-15 DIAGNOSIS — Y998 Other external cause status: Secondary | ICD-10-CM | POA: Insufficient documentation

## 2015-08-15 DIAGNOSIS — M79641 Pain in right hand: Secondary | ICD-10-CM

## 2015-08-15 NOTE — ED Notes (Signed)
Pt ambulated to room. Pt placed in gown.

## 2015-08-15 NOTE — ED Provider Notes (Signed)
CSN: [id number]     Arrival date & time 08/15/15  1212 History  By signing my name below, I, Wendy Watts, attest that this documentation has been prepared under the direction and in the presence of [facility], PA-C. Electronically Signed: Starleen Watts ED Scribe. 08/15/2015. 12:56 PM.    Chief Complaint  Patient presents with  . Hand Injury   The history is provided by the patient and medical records. No language interpreter was used.   HPI Comments: Wendy Watts is a 69 y.o. female who presents to the Emergency Department complaining of constant, moderate dorsal right hand pain and redness onset yesterday.  The patient states she was telling someone to leave her house and forcefully thrust her hand through the door, impacting the dorsal surface on the hand. Denies numbness/tingling.  Past Medical History  Diagnosis Date  . Vertigo   . High cholesterol    Past Surgical History  Procedure Laterality Date  . Total knee arthroplasty Right    History reviewed. No pertinent family history. Social History  Substance Use Topics  . Smoking status: Never Smoker   . Smokeless tobacco: Never Used  . Alcohol Use: Yes   OB History    No data available     Review of Systems 10 Systems reviewed and all are negative for acute change except as noted in the HPI.   Allergies  Allegra; Ativan; Azithromycin; Codeine; Doxycycline hyclate; Penicillins; Trazodone and nefazodone; and Zyrtec  Home Medications   Prior to Admission medications   Medication Sig Start Date End Date Taking? Authorizing Provider  ALPRAZolam Duanne Moron) 0.5 MG tablet Take 0.5 mg by mouth at bedtime as needed for sleep.    Historical Provider, MD  lovastatin (MEVACOR) 40 MG tablet Take 80 mg by mouth every evening.     Historical Provider, MD  ondansetron (ZOFRAN) 8 MG tablet Take 1 tablet (8 mg total) by mouth every 8 (eight) hours as needed for nausea or vomiting. 08/04/13   Gertha Calkin, PA-C   BP  137/103 mmHg  Pulse 94  Temp(Src) 97.9 F (36.6 C) (Oral)  Resp 18  Ht 5' 5.25" (1.657 m)  Wt 70.761 kg  BMI 25.77 kg/m2  SpO2 100% Physical Exam  Constitutional: She is oriented to person, place, and time. She appears well-developed and well-nourished. No distress.  HENT:  Head: Normocephalic and atraumatic.  Cardiovascular: Normal rate, regular rhythm and normal heart sounds.  Exam reveals no gallop and no friction rub.   No murmur heard. Pulmonary/Chest: Effort normal and breath sounds normal. No respiratory distress. She has no wheezes. She has no rales.  Musculoskeletal:  Hand with erythema over middle (3rd) finger at MCP with notable swelling.  TTP over 3rd MCP and proximal phalanx  Full ROM 5/5 muscle strength  Neurological: She is alert and oriented to person, place, and time.  Sensory and motor of RUE intact.   Skin: Skin is warm and dry.  Psychiatric: She has a normal mood and affect. Her behavior is normal.  Nursing note and vitals reviewed.   ED Course  Procedures (including critical care time)  DIAGNOSTIC STUDIES: Oxygen Saturation is 100% on RA, normal by my interpretation.    COORDINATION OF CARE:  12:59 PM Discussed treatment plan with patient at bedside.  Patient acknowledges and agrees with plan.    Labs Review Labs Reviewed - No data to display  Imaging Review Dg Hand Complete Right  08/15/2015  CLINICAL DATA:  Initial encounter for the  right hand injury last night after hitting it on a door. Swelling in the third MCP joint. EXAM: RIGHT HAND - COMPLETE 3+ VIEW COMPARISON:  None. FINDINGS: No evidence of fracture. No subluxation or dislocation. Degenerative changes are noted in the first carpometacarpal joint. Bones are diffusely demineralized. IMPRESSION: No acute bony findings. Electronically Signed   By: Misty Stanley M.D.   On: 08/15/2015 13:54   I have personally reviewed and evaluated these images and lab results as part of my medical  decision-making.   EKG Interpretation None      MDM   Final diagnoses:  Pain of right hand    Wendy Watts presents with right hand pain, erythema, and swelling.   Imaging: Hand x-ray with no acute abnormalities  A&P: Hand pain  - Finger splint, ice to affected area 3-4x/day  - PCP follow up  Patient seen by and discussed with Dr. Ashok Cordia who agrees with treatment plan.   I personally performed the services described in this documentation, which was scribed in my presence. The recorded information has been reviewed and is accurate.  Ozella Almond Ward, PA-C 08/15/15 1418  Lajean Saver, MD 08/15/15 [phone]

## 2015-08-15 NOTE — Discharge Instructions (Signed)
1. Medications: continue usual home medications, tylenol as needed for pain 2. Treatment: rest, ice affected area (see instructions below)  3. Follow Up: Please follow up with your primary doctor in 3 days for discussion of your diagnoses and further evaluation after today's visit; Please return to the ER for numbness/tingling, new or worsening symptoms, any additional concerns.    COLD THERAPY DIRECTIONS:  Ice or gel packs can be used to reduce both pain and swelling. Ice is the most helpful within the first 24 to 48 hours after an injury or flareup from overusing a muscle or joint.  Ice is effective, has very few side effects, and is safe for most people to use.   If you expose your skin to cold temperatures for too long or without the proper protection, you can damage your skin or nerves. Watch for signs of skin damage due to cold.   HOME CARE INSTRUCTIONS  Follow these tips to use ice and cold packs safely.  Place a dry or damp towel between the ice and skin. A damp towel will cool the skin more quickly, so you may need to shorten the time that the ice is used.  For a more rapid response, add gentle compression to the ice.  Ice for no more than 10 to 20 minutes at a time. The bonier the area you are icing, the less time it will take to get the benefits of ice.  Check your skin after 5 minutes to make sure there are no signs of a poor response to cold or skin damage.  Rest 20 minutes or more in between uses.  Once your skin is numb, you can end your treatment. You can test numbness by very lightly touching your skin. The touch should be so light that you do not see the skin dimple from the pressure of your fingertip. When using ice, most people will feel these normal sensations in this order: cold, burning, aching, and numbness.  Do not use ice on someone who cannot communicate their responses to pain, such as small children or people with dementia.

## 2015-08-15 NOTE — ED Notes (Signed)
T reports hand injury occurred while someone entered her house without permission. Pt has pain and swelling to Middle finger Rt hand that extends to hand. Pt reports she hit the hand on door while trying to get the person out of her house.

## 2015-08-15 NOTE — ED Notes (Signed)
Declined W/C at D/C and was escorted to lobby by RN. 

## 2015-12-28 DIAGNOSIS — H524 Presbyopia: Secondary | ICD-10-CM | POA: Diagnosis not present

## 2015-12-28 DIAGNOSIS — Z961 Presence of intraocular lens: Secondary | ICD-10-CM | POA: Diagnosis not present

## 2015-12-28 DIAGNOSIS — H04123 Dry eye syndrome of bilateral lacrimal glands: Secondary | ICD-10-CM | POA: Diagnosis not present

## 2015-12-28 DIAGNOSIS — H5202 Hypermetropia, left eye: Secondary | ICD-10-CM | POA: Diagnosis not present

## 2015-12-28 DIAGNOSIS — H5211 Myopia, right eye: Secondary | ICD-10-CM | POA: Diagnosis not present

## 2015-12-28 DIAGNOSIS — H52223 Regular astigmatism, bilateral: Secondary | ICD-10-CM | POA: Diagnosis not present

## 2016-03-03 DIAGNOSIS — E559 Vitamin D deficiency, unspecified: Secondary | ICD-10-CM | POA: Diagnosis not present

## 2016-03-03 DIAGNOSIS — Z0000 Encounter for general adult medical examination without abnormal findings: Secondary | ICD-10-CM | POA: Diagnosis not present

## 2016-03-03 DIAGNOSIS — F411 Generalized anxiety disorder: Secondary | ICD-10-CM | POA: Diagnosis not present

## 2016-03-03 DIAGNOSIS — Z79899 Other long term (current) drug therapy: Secondary | ICD-10-CM | POA: Diagnosis not present

## 2016-03-03 DIAGNOSIS — J309 Allergic rhinitis, unspecified: Secondary | ICD-10-CM | POA: Diagnosis not present

## 2016-03-03 DIAGNOSIS — E785 Hyperlipidemia, unspecified: Secondary | ICD-10-CM | POA: Diagnosis not present

## 2016-03-24 DIAGNOSIS — D1801 Hemangioma of skin and subcutaneous tissue: Secondary | ICD-10-CM | POA: Diagnosis not present

## 2016-03-24 DIAGNOSIS — D2261 Melanocytic nevi of right upper limb, including shoulder: Secondary | ICD-10-CM | POA: Diagnosis not present

## 2016-03-24 DIAGNOSIS — C44612 Basal cell carcinoma of skin of right upper limb, including shoulder: Secondary | ICD-10-CM | POA: Diagnosis not present

## 2016-03-24 DIAGNOSIS — D2262 Melanocytic nevi of left upper limb, including shoulder: Secondary | ICD-10-CM | POA: Diagnosis not present

## 2016-03-24 DIAGNOSIS — L821 Other seborrheic keratosis: Secondary | ICD-10-CM | POA: Diagnosis not present

## 2016-03-24 DIAGNOSIS — L814 Other melanin hyperpigmentation: Secondary | ICD-10-CM | POA: Diagnosis not present

## 2016-03-24 DIAGNOSIS — C44519 Basal cell carcinoma of skin of other part of trunk: Secondary | ICD-10-CM | POA: Diagnosis not present

## 2016-03-24 DIAGNOSIS — Z85828 Personal history of other malignant neoplasm of skin: Secondary | ICD-10-CM | POA: Diagnosis not present

## 2016-03-24 DIAGNOSIS — D225 Melanocytic nevi of trunk: Secondary | ICD-10-CM | POA: Diagnosis not present

## 2016-03-24 DIAGNOSIS — L918 Other hypertrophic disorders of the skin: Secondary | ICD-10-CM | POA: Diagnosis not present

## 2016-05-31 DIAGNOSIS — F339 Major depressive disorder, recurrent, unspecified: Secondary | ICD-10-CM | POA: Diagnosis not present

## 2016-05-31 DIAGNOSIS — R42 Dizziness and giddiness: Secondary | ICD-10-CM | POA: Diagnosis not present

## 2016-05-31 DIAGNOSIS — F411 Generalized anxiety disorder: Secondary | ICD-10-CM | POA: Diagnosis not present

## 2016-06-01 ENCOUNTER — Other Ambulatory Visit: Payer: Self-pay | Admitting: Family Medicine

## 2016-06-01 ENCOUNTER — Other Ambulatory Visit: Payer: Self-pay | Admitting: Obstetrics and Gynecology

## 2016-06-01 DIAGNOSIS — Z1231 Encounter for screening mammogram for malignant neoplasm of breast: Secondary | ICD-10-CM

## 2016-07-06 ENCOUNTER — Ambulatory Visit: Payer: PPO

## 2016-07-14 DIAGNOSIS — F411 Generalized anxiety disorder: Secondary | ICD-10-CM | POA: Diagnosis not present

## 2016-07-14 DIAGNOSIS — R6884 Jaw pain: Secondary | ICD-10-CM | POA: Diagnosis not present

## 2016-08-25 ENCOUNTER — Encounter (INDEPENDENT_AMBULATORY_CARE_PROVIDER_SITE_OTHER): Payer: Self-pay

## 2016-08-25 ENCOUNTER — Ambulatory Visit (INDEPENDENT_AMBULATORY_CARE_PROVIDER_SITE_OTHER): Payer: PPO | Admitting: Family

## 2016-08-25 ENCOUNTER — Ambulatory Visit (INDEPENDENT_AMBULATORY_CARE_PROVIDER_SITE_OTHER): Payer: PPO

## 2016-08-25 VITALS — Ht 65.0 in | Wt 156.0 lb

## 2016-08-25 DIAGNOSIS — M25561 Pain in right knee: Secondary | ICD-10-CM

## 2016-08-25 NOTE — Progress Notes (Signed)
   Office Visit Note   Patient: Wendy Watts           Date of Birth: 1946-02-04           MRN: [id number] Visit Date: 08/25/2016              Requested by: [name], MD [place], [place] [address] PCP: Melinda Crutch, MD  No chief complaint on file.   HPI: Patient states that she fell on Tuesday and her knee turned at a 90 degree angle. Patient complains of anterior knee pain. She states that she had a knee replacement about 3-4 years ago and with the injury she is afraid that she caused damage.  She does have tenderness with ambulation. "I like to shag and want to make sure that I can do this" Pamella Pert, RMA  The patient is a 71 year old woman who presents today for evaluation of right knee pain. She states that she fell 2 days ago and asked her knee all the way back. Fell into a squatting position. Did not land on her knee. Complaining of anterior knee pain today. Pain aggravated by ambulation. She is several years out from total knee arthroplasty right knee. States she is concerned she may have damaged her hardware. Seeking reassurance that she will be able to return to activities as previous to the fall.    Assessment & Plan: Visit Diagnoses: No diagnosis found.  Plan: Reassurance provided. Recommended that she use ibuprofen or Aleve as needed for pain. May use ice. Discussed that this will gradually improve. Activities as tolerated. May follow up in the office if symptoms fail to improve.  Follow-Up Instructions: No Follow-up on file.   Exam: Patient is alert and oriented. No adenopathy. Well-dressed. Normal affect. Respirations easy. Antalgic gait.  Right Knee Exam   Tenderness  Right knee tenderness location: Globally tender.  Range of Motion  Extension: normal  Flexion: normal   Muscle Strength   The patient has normal right knee strength.  Tests  McMurray:  Medial - negative Lateral - negative  Other  Swelling: mild Other tests: no  effusion present       Imaging: No results found.  Orders:  No orders of the defined types were placed in this encounter.  No orders of the defined types were placed in this encounter.    Procedures: No procedures performed  Clinical Data: No additional findings.  Subjective: Review of Systems  Constitutional: Negative for chills and fever.  Musculoskeletal: Positive for arthralgias and gait problem. Negative for joint swelling.  Skin: Negative for wound.    Objective: Vital Signs: There were no vitals taken for this visit.  Specialty Comments:  No specialty comments available.  PMFS History: There are no active problems to display for this patient.  Past Medical History:  Diagnosis Date  . High cholesterol   . Vertigo     No family history on file.  Past Surgical History:  Procedure Laterality Date  . TOTAL KNEE ARTHROPLASTY Right    Social History   Occupational History  . Not on file.   Social History Main Topics  . Smoking status: Never Smoker  . Smokeless tobacco: Never Used  . Alcohol use Yes  . Drug use: No  . Sexual activity: Not on file

## 2016-09-07 ENCOUNTER — Ambulatory Visit
Admission: RE | Admit: 2016-09-07 | Discharge: 2016-09-07 | Disposition: A | Discharge status: Home / Self Care | Payer: PPO | Source: Ambulatory Visit | Attending: Internal Medicine | Admitting: Internal Medicine

## 2016-09-07 DIAGNOSIS — F411 Generalized anxiety disorder: Secondary | ICD-10-CM | POA: Diagnosis not present

## 2016-09-07 DIAGNOSIS — Z1231 Encounter for screening mammogram for malignant neoplasm of breast: Secondary | ICD-10-CM

## 2016-09-07 DIAGNOSIS — S63509A Unspecified sprain of unspecified wrist, initial encounter: Secondary | ICD-10-CM | POA: Diagnosis not present

## 2016-09-08 ENCOUNTER — Ambulatory Visit
Admission: RE | Admit: 2016-09-08 | Discharge: 2016-09-08 | Disposition: A | Discharge status: Home / Self Care | Payer: PPO | Source: Ambulatory Visit | Attending: Family Medicine | Admitting: Family Medicine

## 2016-09-08 ENCOUNTER — Other Ambulatory Visit: Payer: Self-pay | Admitting: Family Medicine

## 2016-09-08 DIAGNOSIS — M25531 Pain in right wrist: Secondary | ICD-10-CM | POA: Diagnosis not present

## 2016-09-08 DIAGNOSIS — R52 Pain, unspecified: Secondary | ICD-10-CM

## 2016-09-21 ENCOUNTER — Emergency Department (HOSPITAL_COMMUNITY)
Admission: EM | Admit: 2016-09-21 | Discharge: 2016-09-21 | Disposition: A | Discharge status: Home / Self Care | Payer: PPO | Attending: Emergency Medicine | Admitting: Emergency Medicine

## 2016-09-21 ENCOUNTER — Emergency Department (HOSPITAL_COMMUNITY): Payer: PPO

## 2016-09-21 ENCOUNTER — Encounter (HOSPITAL_COMMUNITY): Payer: Self-pay | Admitting: Emergency Medicine

## 2016-09-21 DIAGNOSIS — Z96651 Presence of right artificial knee joint: Secondary | ICD-10-CM | POA: Insufficient documentation

## 2016-09-21 DIAGNOSIS — R1032 Left lower quadrant pain: Secondary | ICD-10-CM | POA: Diagnosis not present

## 2016-09-21 DIAGNOSIS — R112 Nausea with vomiting, unspecified: Secondary | ICD-10-CM | POA: Diagnosis not present

## 2016-09-21 DIAGNOSIS — Z79899 Other long term (current) drug therapy: Secondary | ICD-10-CM | POA: Diagnosis not present

## 2016-09-21 LAB — CBC WITH DIFFERENTIAL/PLATELET
Basophils Absolute: 0 10*3/uL (ref 0.0–0.1)
Basophils Relative: 1 %
Eosinophils Absolute: 0.1 10*3/uL (ref 0.0–0.7)
Eosinophils Relative: 2 %
HCT: 39.5 % (ref 36.0–46.0)
Hemoglobin: 13.1 g/dL (ref 12.0–15.0)
Lymphocytes Relative: 12 %
Lymphs Abs: 1 10*3/uL (ref 0.7–4.0)
MCH: 29.2 pg (ref 26.0–34.0)
MCHC: 33.2 g/dL (ref 30.0–36.0)
MCV: 88.2 fL (ref 78.0–100.0)
Monocytes Absolute: 0.6 10*3/uL (ref 0.1–1.0)
Monocytes Relative: 7 %
Neutro Abs: 6.2 10*3/uL (ref 1.7–7.7)
Neutrophils Relative %: 78 %
Platelets: 296 10*3/uL (ref 150–400)
RBC: 4.48 MIL/uL (ref 3.87–5.11)
RDW: 13.8 % (ref 11.5–15.5)
WBC: 7.9 10*3/uL (ref 4.0–10.5)

## 2016-09-21 LAB — COMPREHENSIVE METABOLIC PANEL
ALT: 20 U/L (ref 14–54)
AST: 20 U/L (ref 15–41)
Albumin: 4 g/dL (ref 3.5–5.0)
Alkaline Phosphatase: 69 U/L (ref 38–126)
Anion gap: 6 (ref 5–15)
BUN: 13 mg/dL (ref 6–20)
CO2: 25 mmol/L (ref 22–32)
Calcium: 9.8 mg/dL (ref 8.9–10.3)
Chloride: 110 mmol/L (ref 101–111)
Creatinine, Ser: 0.9 mg/dL (ref 0.44–1.00)
GFR calc Af Amer: >60 mL/min (ref >60)
GFR calc non Af Amer: >60 mL/min (ref >60)
Glucose, Bld: 112 mg/dL — ABNORMAL HIGH (ref 65–99)
Potassium: 3.9 mmol/L (ref 3.5–5.1)
Sodium: 141 mmol/L (ref 135–145)
Total Bilirubin: 0.4 mg/dL (ref 0.3–1.2)
Total Protein: 7.4 g/dL (ref 6.5–8.1)

## 2016-09-21 LAB — URINALYSIS, ROUTINE W REFLEX MICROSCOPIC
Bilirubin Urine: NEGATIVE
Glucose, UA: NEGATIVE mg/dL
Ketones, ur: NEGATIVE mg/dL
Nitrite: NEGATIVE
Protein, ur: 100 mg/dL — AB
Specific Gravity, Urine: 1.02 (ref 1.005–1.030)
pH: 5 (ref 5.0–8.0)

## 2016-09-21 LAB — LIPASE, BLOOD: Lipase: 43 U/L (ref 11–51)

## 2016-09-21 MED ORDER — ONDANSETRON 4 MG PO TBDP: 4.0000 mg | ORAL_TABLET | Freq: Three times a day (TID) | ORAL | 0 refills | Status: AC | PRN

## 2016-09-21 MED ORDER — ONDANSETRON 4 MG PO TBDP
4.0000 mg | ORAL_TABLET | Freq: Once | ORAL | Status: AC | PRN
  Administered 2016-09-21: 4 mg via ORAL
  Filled 2016-09-21: qty 1

## 2016-09-21 MED ORDER — IOPAMIDOL (ISOVUE-300) INJECTION 61%
30.0000 mL | Freq: Once | INTRAVENOUS | Status: AC | PRN
  Administered 2016-09-21: 30 mL via ORAL

## 2016-09-21 MED ORDER — CEPHALEXIN 500 MG PO CAPS: 500.0000 mg | ORAL_CAPSULE | Freq: Four times a day (QID) | ORAL | 0 refills | Status: AC

## 2016-09-21 MED ORDER — IOPAMIDOL (ISOVUE-300) INJECTION 61%
INTRAVENOUS | Status: AC
  Administered 2016-09-21: 30 mL via ORAL
  Filled 2016-09-21: qty 30

## 2016-09-21 MED ORDER — CEPHALEXIN 500 MG PO CAPS
500.0000 mg | ORAL_CAPSULE | Freq: Once | ORAL | Status: AC
  Administered 2016-09-21: 500 mg via ORAL
  Filled 2016-09-21: qty 1

## 2016-09-21 MED ORDER — ONDANSETRON HCL 4 MG/2ML IJ SOLN
4.0000 mg | Freq: Once | INTRAMUSCULAR | Status: AC
  Administered 2016-09-21: 4 mg via INTRAVENOUS
  Filled 2016-09-21: qty 2

## 2016-09-21 MED ORDER — SODIUM CHLORIDE 0.9 % IV BOLUS (SEPSIS)
1000.0000 mL | Freq: Once | INTRAVENOUS | Status: AC
  Administered 2016-09-21: 1000 mL via INTRAVENOUS

## 2016-09-21 NOTE — ED Provider Notes (Signed)
[facility] DEPT Provider Note   CSN: [id number] Arrival date & time: 09/21/16  0335     History   Chief Complaint Chief Complaint  Patient presents with  . Emesis  . Abdominal Pain    HPI Wendy Watts is a 71 y.o. female.  The history is provided by the patient.  Abdominal Pain   This is a new problem. The current episode started 3 to 5 hours ago. The problem occurs constantly. The problem has been gradually improving. The pain is located in the LLQ. The quality of the pain is aching. The pain is moderate. Associated symptoms include nausea and vomiting. Nothing aggravates the symptoms. Nothing relieves the symptoms.    Past Medical History:  Diagnosis Date  . High cholesterol   . Vertigo     There are no active problems to display for this patient.   Past Surgical History:  Procedure Laterality Date  . TOTAL KNEE ARTHROPLASTY Right     OB History    No data available       Home Medications    Prior to Admission medications   Medication Sig Start Date End Date Taking? Authorizing Provider  ALPRAZolam Duanne Moron) 0.5 MG tablet Take 0.5 mg by mouth at bedtime as needed for sleep.   Yes Historical Provider, MD  fluticasone (FLONASE) 50 MCG/ACT nasal spray Place 2 sprays into both nostrils daily as needed for allergies or rhinitis.   Yes Historical Provider, MD  lovastatin (MEVACOR) 40 MG tablet Take 80 mg by mouth every evening.    Yes Historical Provider, MD  cephALEXin (KEFLEX) 500 MG capsule Take 1 capsule (500 mg total) by mouth 4 (four) times daily. 09/21/16 09/28/16  Fatima Blank, MD  ondansetron (ZOFRAN ODT) 4 MG disintegrating tablet Take 1 tablet (4 mg total) by mouth every 8 (eight) hours as needed for nausea or vomiting. 09/21/16 09/24/16  Fatima Blank, MD    Family History History reviewed. No pertinent family history.  Social History Social History  Substance Use Topics  . Smoking status: Never Smoker  . Smokeless tobacco: Never Used   . Alcohol use Yes     Allergies   Allegra [fexofenadine hcl]; Ativan [lorazepam]; Azithromycin; Codeine; Doxycycline hyclate; Penicillins; Trazodone and nefazodone; and Zyrtec [cetirizine]   Review of Systems Review of Systems  Gastrointestinal: Positive for abdominal pain, nausea and vomiting.   Ten systems are reviewed and are negative for acute change except as noted in the HPI   Physical Exam Updated Vital Signs BP 133/68   Pulse 76   Temp 98.2 F (36.8 C) (Oral)   Resp 18   Ht 5\' 5"  (1.651 m)   Wt 152 lb (68.9 kg)   SpO2 97%   BMI 25.29 kg/m   Physical Exam  Constitutional: She is oriented to person, place, and time. She appears well-developed and well-nourished. No distress.  HENT:  Head: Normocephalic and atraumatic.  Nose: Nose normal.  Eyes: Conjunctivae and EOM are normal. Pupils are equal, round, and reactive to light. Right eye exhibits no discharge. Left eye exhibits no discharge. No scleral icterus.  Neck: Normal range of motion. Neck supple.  Cardiovascular: Normal rate and regular rhythm.  Exam reveals no gallop and no friction rub.   No murmur heard. Pulmonary/Chest: Effort normal and breath sounds normal. No stridor. No respiratory distress. She has no rales.  Abdominal: Soft. She exhibits no distension. There is tenderness in the left lower quadrant. There is no rigidity, no rebound, no  guarding and no CVA tenderness.  Musculoskeletal: She exhibits no edema or tenderness.  Neurological: She is alert and oriented to person, place, and time.  Skin: Skin is warm and dry. No rash noted. She is not diaphoretic. No erythema.  Psychiatric: She has a normal mood and affect.  Vitals reviewed.    ED Treatments / Results  Labs (all labs ordered are listed, but only abnormal results are displayed) Labs Reviewed  COMPREHENSIVE METABOLIC PANEL - Abnormal; Notable for the following:       Result Value   Glucose, Bld 112 (*)    All other components within  normal limits  URINALYSIS, ROUTINE W REFLEX MICROSCOPIC - Abnormal; Notable for the following:    Color, Urine AMBER (*)    APPearance CLOUDY (*)    Hgb urine dipstick LARGE (*)    Protein, ur 100 (*)    Leukocytes, UA MODERATE (*)    Bacteria, UA RARE (*)    Squamous Epithelial / LPF 6-30 (*)    All other components within normal limits  LIPASE, BLOOD  CBC WITH DIFFERENTIAL/PLATELET    EKG  EKG Interpretation None       Radiology Ct Abdomen Pelvis Wo Contrast  Result Date: 09/21/2016 CLINICAL DATA:  Left lower abdominal pain. EXAM: CT ABDOMEN AND PELVIS WITHOUT CONTRAST TECHNIQUE: Multidetector CT imaging of the abdomen and pelvis was performed following the standard protocol without IV contrast. COMPARISON:  None. FINDINGS: Lower chest: No acute abnormality. Hepatobiliary: No focal liver abnormality is seen. No gallstones, gallbladder wall thickening, or biliary dilatation. Pancreas: Unremarkable. No pancreatic ductal dilatation or surrounding inflammatory changes. Spleen: Normal in size without focal abnormality. Adrenals/Urinary Tract: Adrenal glands are unremarkable. Kidneys are normal, without renal calculi, focal lesion, or hydronephrosis. Incidental note made of an extrarenal pelvis bilaterally. Bladder is unremarkable. Stomach/Bowel: Stomach is within normal limits. Appendix appears normal. No evidence of bowel wall thickening, distention, or inflammatory changes. No pneumoperitoneum, pneumatosis or portal venous gas. Scattered diverticular disease of the descending and sigmoid colon. Vascular/Lymphatic: Normal caliber abdominal aorta. Mild atherosclerosis of the abdominal aorta. No lymphadenopathy. Reproductive: Status post hysterectomy. No adnexal masses. Other: No abdominal wall hernia or abnormality. No abdominopelvic ascites. Musculoskeletal: No acute or significant osseous findings. Small amount of fluid in the right ileus iliopsoas bursa consistent with mild iliopsoas bursitis.  IMPRESSION: 1. No acute abdominal or pelvic pathology. 2. Mild right iliopsoas bursitis. Electronically Signed   By: Kathreen Devoid   On: 09/21/2016 10:34    Procedures Procedures (including critical care time)  Medications Ordered in ED Medications  cephALEXin (KEFLEX) capsule 500 mg (not administered)  ondansetron (ZOFRAN-ODT) disintegrating tablet 4 mg (4 mg Oral Given 09/21/16 0648)  ondansetron (ZOFRAN) injection 4 mg (4 mg Intravenous Given 09/21/16 0946)  sodium chloride 0.9 % bolus 1,000 mL (1,000 mLs Intravenous New Bag/Given 09/21/16 0938)  iopamidol (ISOVUE-300) 61 % injection 30 mL (30 mLs Oral Contrast Given 09/21/16 0845)     Initial Impression / Assessment and Plan / ED Course  I have reviewed the triage vital signs and the nursing notes.  Pertinent labs & imaging results that were available during my care of the patient were reviewed by me and considered in my medical decision making (see chart for details).     Labs grossly reassuring without evidence of leukocytosis however exam was concerning for possible diverticulitis. CT scan revealed no evidence of intra-abdominal inflammation/infectious process. UA did note moderate leukocytes with hematuria. No stones noted on CT scan thus  we'll treat for possible urinary tract infection/pyelonephritis. Patient provided with Zofran in the emergency department and was able to tolerate by mouth. First dose of Keflex given in the emergency department. She was monitored for one hour following administration  (due to her penicillin allergy) without allergic reaction.  The patient is safe for discharge with strict return precautions.   Final Clinical Impressions(s) / ED Diagnoses   Final diagnoses:  Left lower quadrant pain  Non-intractable vomiting with nausea, unspecified vomiting type   Disposition: Discharge  Condition: Good  I have discussed the results, Dx and Tx plan with the patient who expressed understanding and agree(s) with  the plan. Discharge instructions discussed at great length. The patient was given strict return precautions who verbalized understanding of the instructions. No further questions at time of discharge.    New Prescriptions   CEPHALEXIN (KEFLEX) 500 MG CAPSULE    Take 1 capsule (500 mg total) by mouth 4 (four) times daily.   ONDANSETRON (ZOFRAN ODT) 4 MG DISINTEGRATING TABLET    Take 1 tablet (4 mg total) by mouth every 8 (eight) hours as needed for nausea or vomiting.    Follow Up: [name], MD [place] [id number] [phone]  Schedule an appointment as soon as possible for a visit in 2 days For close follow up to assess for possible UTI      Fatima Blank, MD 09/21/16 1148

## 2016-09-21 NOTE — ED Notes (Signed)
Bed: WA01 Expected date:  Expected time:  Means of arrival:  Comments: 

## 2016-09-21 NOTE — ED Triage Notes (Signed)
Patient complaining of nausea, vomiting, and left lower abdominal pain. Patient states the symptoms started around 2 am.

## 2016-09-23 DIAGNOSIS — N3001 Acute cystitis with hematuria: Secondary | ICD-10-CM | POA: Diagnosis not present

## 2016-10-11 DIAGNOSIS — N12 Tubulo-interstitial nephritis, not specified as acute or chronic: Secondary | ICD-10-CM | POA: Diagnosis not present

## 2016-10-11 DIAGNOSIS — Z09 Encounter for follow-up examination after completed treatment for conditions other than malignant neoplasm: Secondary | ICD-10-CM | POA: Diagnosis not present

## 2016-11-24 ENCOUNTER — Ambulatory Visit (INDEPENDENT_AMBULATORY_CARE_PROVIDER_SITE_OTHER): Payer: PPO

## 2016-11-24 ENCOUNTER — Ambulatory Visit (INDEPENDENT_AMBULATORY_CARE_PROVIDER_SITE_OTHER): Payer: PPO | Admitting: Orthopedic Surgery

## 2016-11-24 DIAGNOSIS — M542 Cervicalgia: Secondary | ICD-10-CM | POA: Diagnosis not present

## 2016-11-24 MED ORDER — NABUMETONE 750 MG PO TABS: 750.0000 mg | ORAL_TABLET | Freq: Two times a day (BID) | ORAL | 3 refills | Status: AC | PRN

## 2016-11-24 NOTE — Progress Notes (Signed)
   Office Visit Note   Patient: Wendy Watts           Date of Birth: 28-Jan-1946           MRN: [id number] Visit Date: 11/24/2016              Requested by: [name], MD [place], [place] [address] PCP: Melinda Crutch, MD  No chief complaint on file.     HPI: Patient is a 71 year old woman who recently had a ground-level all and recently has been having increasing pain medial scapular borders bilaterally right worse than left. Patient states that it takes her breath away that it is so painful. Patient denies any weakness in her upper extremities or any radicular pain in her upper extremities.  Assessment & Plan: Visit Diagnoses:  1. Neck pain on right side     Plan: Patient states that prednisone makes her swell we will: A prescription for Relafen twice a day recommended thermal care heating pads for the neck or heating pad.  Follow-Up Instructions: Return in about 4 weeks (around 12/22/2016).   Ortho Exam  Patient is alert, oriented, no adenopathy, well-dressed, normal affect, normal respiratory effort. Examination patient has a normal gait. She is tender to palpation of the medial scapular border bilaterally. She has no pain to palpation of the spinous processes. The thoracic outlet is minimally tender to palpation. She has no focal motor weakness or any motor groups of both upper extremities. Patient does have decreased range of motion of her cervical spine with flexion rotation and extension.  Imaging: Xr Cervical Spine 2 Or 3 Views  Result Date: 11/24/2016 Two-view radiographs of the cervical spine shows advanced arthritis at the level of C3 C4 C5. There is anterior bony spurs with joint space collapse through these 2 levels with lesser involvement of the other levels of the cervical spine.   Labs: No results found for: HGBA1C, ESRSEDRATE, CRP, LABURIC, REPTSTATUS, GRAMSTAIN, CULT, LABORGA  Orders:  Orders Placed This Encounter  Procedures  . XR  Cervical Spine 2 or 3 views   Meds ordered this encounter  Medications  . nabumetone (RELAFEN) 750 MG tablet    Sig: Take 1 tablet (750 mg total) by mouth 2 (two) times daily as needed for mild pain or moderate pain. with food    Dispense:  60 tablet    Refill:  3     Procedures: No procedures performed  Clinical Data: No additional findings.  ROS:  All other systems negative, except as noted in the HPI. Review of Systems  Objective: Vital Signs: There were no vitals taken for this visit.  Specialty Comments:  No specialty comments available.  PMFS History: There are no active problems to display for this patient.  Past Medical History:  Diagnosis Date  . High cholesterol   . Vertigo     No family history on file.  Past Surgical History:  Procedure Laterality Date  . TOTAL KNEE ARTHROPLASTY Right    Social History   Occupational History  . Not on file.   Social History Main Topics  . Smoking status: Never Smoker  . Smokeless tobacco: Never Used  . Alcohol use Yes  . Drug use: No  . Sexual activity: Not on file

## 2016-12-22 ENCOUNTER — Ambulatory Visit (INDEPENDENT_AMBULATORY_CARE_PROVIDER_SITE_OTHER): Payer: PPO | Admitting: Orthopedic Surgery

## 2016-12-29 DIAGNOSIS — R319 Hematuria, unspecified: Secondary | ICD-10-CM | POA: Diagnosis not present

## 2017-01-04 DIAGNOSIS — R319 Hematuria, unspecified: Secondary | ICD-10-CM | POA: Diagnosis not present

## 2017-02-22 DIAGNOSIS — R6884 Jaw pain: Secondary | ICD-10-CM | POA: Diagnosis not present

## 2017-03-13 ENCOUNTER — Ambulatory Visit (INDEPENDENT_AMBULATORY_CARE_PROVIDER_SITE_OTHER): Payer: PPO | Admitting: Orthopedic Surgery

## 2017-03-13 ENCOUNTER — Encounter (INDEPENDENT_AMBULATORY_CARE_PROVIDER_SITE_OTHER): Payer: Self-pay | Admitting: Orthopedic Surgery

## 2017-03-13 ENCOUNTER — Ambulatory Visit (INDEPENDENT_AMBULATORY_CARE_PROVIDER_SITE_OTHER): Payer: PPO

## 2017-03-13 DIAGNOSIS — M79661 Pain in right lower leg: Secondary | ICD-10-CM

## 2017-03-13 DIAGNOSIS — M25571 Pain in right ankle and joints of right foot: Secondary | ICD-10-CM | POA: Diagnosis not present

## 2017-03-13 NOTE — Progress Notes (Signed)
   Office Visit Note   Patient: Wendy Watts           Date of Birth: 1946/04/10           MRN: [id number] Visit Date: 03/13/2017              Requested by: [name], [place], [place] [address] PCP: [name], MD  Chief Complaint  Patient presents with  . Right Foot - Pain  . Right Leg - Pain      HPI: Patient is a 71 year old woman who states that she was walking in flip-flops that she caught her flip-flop on the floor tripped and fell sustaining injury to the right foot and right leg. She complains of bruising swelling over the right leg as well as bruising swelling over the right third toe.  Assessment & Plan: Visit Diagnoses:  1. Pain in right lower leg   2. Pain in right ankle and joints of right foot     Plan: We will place a postoperative shoe to unload pressure on the third toe she will increase her activities as tolerated.  Follow-Up Instructions: Return if symptoms worsen or fail to improve.   Ortho Exam  Patient is alert, oriented, no adenopathy, well-dressed, normal affect, normal respiratory effort. Examination patient has an antalgic gait she has a good dorsalis pedis and posterior tibial pulse. She has blistering dorsally over the third toe due to the tripping episode she has swelling in the foot and ankle. There is no skin breakdown and no signs of infection there is no angular deformity to the toe radiographically and clinically there is no fracture. Examination she has ecchymosis bruising and swelling over the distal third of the right tibia. The tibia is stable no evidence of fracture. There is no evidence of a compartment syndrome. Her calf is soft.  Imaging: Xr Foot Complete Right  Result Date: 03/13/2017 Two-view radiographs the right foot shows no evidence of a fracture no Lisfranc dislocation.  Xr Tibia/fibula Right  Result Date: 03/13/2017 Two-view radiographs of the right tibia and fibula show stable total knee  arthroplasty no evidence of fracture of the tibia or fibula.   Labs: No results found for: HGBA1C, ESRSEDRATE, CRP, LABURIC, REPTSTATUS, GRAMSTAIN, CULT, LABORGA  Orders:  Orders Placed This Encounter  Procedures  . XR Foot Complete Right  . XR Tibia/Fibula Right   No orders of the defined types were placed in this encounter.    Procedures: No procedures performed  Clinical Data: No additional findings.  ROS:  All other systems negative, except as noted in the HPI. Review of Systems  Objective: Vital Signs: There were no vitals taken for this visit.  Specialty Comments:  No specialty comments available.  PMFS History: There are no active problems to display for this patient.  Past Medical History:  Diagnosis Date  . High cholesterol   . Vertigo     History reviewed. No pertinent family history.  Past Surgical History:  Procedure Laterality Date  . TOTAL KNEE ARTHROPLASTY Right    Social History   Occupational History  . Not on file.   Social History Main Topics  . Smoking status: Never Smoker  . Smokeless tobacco: Never Used  . Alcohol use Yes  . Drug use: No  . Sexual activity: Not on file

## 2017-03-28 DIAGNOSIS — Z79899 Other long term (current) drug therapy: Secondary | ICD-10-CM | POA: Diagnosis not present

## 2017-03-28 DIAGNOSIS — E559 Vitamin D deficiency, unspecified: Secondary | ICD-10-CM | POA: Diagnosis not present

## 2017-03-28 DIAGNOSIS — E785 Hyperlipidemia, unspecified: Secondary | ICD-10-CM | POA: Diagnosis not present

## 2017-04-05 DIAGNOSIS — J309 Allergic rhinitis, unspecified: Secondary | ICD-10-CM | POA: Diagnosis not present

## 2017-04-05 DIAGNOSIS — E559 Vitamin D deficiency, unspecified: Secondary | ICD-10-CM | POA: Diagnosis not present

## 2017-04-05 DIAGNOSIS — G43909 Migraine, unspecified, not intractable, without status migrainosus: Secondary | ICD-10-CM | POA: Diagnosis not present

## 2017-04-05 DIAGNOSIS — F411 Generalized anxiety disorder: Secondary | ICD-10-CM | POA: Diagnosis not present

## 2017-04-05 DIAGNOSIS — E785 Hyperlipidemia, unspecified: Secondary | ICD-10-CM | POA: Diagnosis not present

## 2017-04-05 DIAGNOSIS — Z0000 Encounter for general adult medical examination without abnormal findings: Secondary | ICD-10-CM | POA: Diagnosis not present

## 2017-04-05 DIAGNOSIS — F339 Major depressive disorder, recurrent, unspecified: Secondary | ICD-10-CM | POA: Diagnosis not present

## 2017-04-05 DIAGNOSIS — Z23 Encounter for immunization: Secondary | ICD-10-CM | POA: Diagnosis not present

## 2017-04-12 ENCOUNTER — Encounter (INDEPENDENT_AMBULATORY_CARE_PROVIDER_SITE_OTHER): Payer: Self-pay | Admitting: Family

## 2017-04-12 ENCOUNTER — Ambulatory Visit (INDEPENDENT_AMBULATORY_CARE_PROVIDER_SITE_OTHER): Payer: PPO | Admitting: Family

## 2017-04-12 ENCOUNTER — Other Ambulatory Visit (INDEPENDENT_AMBULATORY_CARE_PROVIDER_SITE_OTHER): Payer: Self-pay | Admitting: Family

## 2017-04-12 DIAGNOSIS — M545 Low back pain, unspecified: Secondary | ICD-10-CM

## 2017-04-12 DIAGNOSIS — M461 Sacroiliitis, not elsewhere classified: Secondary | ICD-10-CM

## 2017-04-12 MED ORDER — NAPROXEN 500 MG PO TABS
500.0000 mg | ORAL_TABLET | Freq: Two times a day (BID) | ORAL | 0 refills | Status: DC
Start: 2017-04-12 — End: 2017-04-12

## 2017-04-12 NOTE — Progress Notes (Signed)
Office Visit Note   Patient: Wendy Watts           Date of Birth: [birth year]           MRN: [id number] Visit Date: 04/12/2017              Requested by: [name], [place], [place] [address] PCP: [name], MD  Chief Complaint  Patient presents with  . Lower Back - Pain      HPI: The patient is a 71 year old woman who presents today complaining of right-sided low back pain. She states that this been ongoing for a couple weeks has been worsening. Denies any numbness tingling or shooting pain down either extremity no weakness no reflex symptoms. She has some midline as well as right sided pain. She does have a history of sciatica. An MRI last 2007 as below.  States her pain is not like when she's had sciatica in the past   MRI LUMBAR SPINE WITHOUT CONTRAST:  Technique: Multiplanar and multiecho pulse sequences of the lumbar spine, to include the lower thoracic and upper sacral regions, were obtained according to standard protocol without IV contrast.  Comparison: None.  Findings:  Five lumbar type vertebral bodies are assumed. Lumbar alignment is anatomic. There are no suspicious bone marrow signal abnormalities.   Conus medullaris extends to the L1-2 disk space and appears normal.   T11-12: Mild disk bulge.  T12-L1: Mild disk bulge and tiny central annular rent. No disk protrusion, spinal stenosis, or nerve root encroachment.  L1-2: Normal interspace.  L2-3: Normal interspace.  L3-4: Mild disk bulge with shallow left foraminal disk protrusion. There is possible encroachment on the left L3 nerve root, but no nerve root displacement or compression. Central canal is adequately patent.  L4-5: Mild disk bulge and bilateral facet hypertrophy. There is a small foraminal annular rent on the left without disk protrusion or L4 nerve root encroachment.  L5-S1: Mild facet hypertrophy. No spinal stenosis or nerve root encroachment.  IMPRESSION:  1. Mild  degenerative changes of the lumbar spine, as described, most advanced at L3-4. There is a small foraminal disk protrusion on the left at L3-4 with possible resulting left L3 nerve root encroachment.  2. Central canal is widely patent at all levels, and there is no other evidence of nerve root encroachment.  3. Distal thoracic cord and conus medullaris appear normal.  Assessment & Plan: Visit Diagnoses:  1. Acute right-sided low back pain without sciatica   2. Sacroiliitis, not elsewhere classified ([place])     Plan: Offered prednisone taper patient declined. She states it makes her swell. We'll trial a course of anti-inflammatories if no improvement may consider referral to [facility]. We will likely need to update radiographs prior.  Follow-Up Instructions: Return in about 3 weeks (around 05/03/2017).   Back Exam   Tenderness  The patient is experiencing tenderness in the lumbar and sacroiliac.  Range of Motion  The patient has normal back ROM.  Muscle Strength  The patient has normal back strength.  Tests  Straight leg raise right: negative Straight leg raise left: negative  Other  Erythema: no back redness      Patient is alert, oriented, no adenopathy, well-dressed, normal affect, normal respiratory effort.  Imaging: No results found. No images are attached to the encounter.  Labs: No results found for: HGBA1C, ESRSEDRATE, CRP, LABURIC, REPTSTATUS, GRAMSTAIN, CULT, LABORGA  Orders:  No orders of the defined types were placed  in this encounter.  Meds ordered this encounter  Medications  . naproxen (NAPROSYN) 500 MG tablet    Sig: Take 1 tablet (500 mg total) by mouth 2 (two) times daily with a meal. With meals    Dispense:  60 tablet    Refill:  0     Procedures: No procedures performed  Clinical Data: No additional findings.  ROS:  All other systems negative, except as noted in the HPI. Review of Systems  Constitutional: Negative for chills and fever.    Musculoskeletal: Positive for arthralgias and back pain. Negative for myalgias.  Neurological: Negative for weakness and numbness.    Objective: Vital Signs: There were no vitals taken for this visit.  Specialty Comments:  No specialty comments available.  PMFS History: There are no active problems to display for this patient.  Past Medical History:  Diagnosis Date  . High cholesterol   . Vertigo     No family history on file.  Past Surgical History:  Procedure Laterality Date  . TOTAL KNEE ARTHROPLASTY Right    Social History   Occupational History  . Not on file.   Social History Main Topics  . Smoking status: Never Smoker  . Smokeless tobacco: Never Used  . Alcohol use Yes  . Drug use: No  . Sexual activity: Not on file

## 2017-05-03 ENCOUNTER — Ambulatory Visit (INDEPENDENT_AMBULATORY_CARE_PROVIDER_SITE_OTHER): Payer: PPO | Admitting: Family

## 2017-05-04 ENCOUNTER — Encounter (INDEPENDENT_AMBULATORY_CARE_PROVIDER_SITE_OTHER): Payer: Self-pay | Admitting: Family

## 2017-05-04 ENCOUNTER — Ambulatory Visit (INDEPENDENT_AMBULATORY_CARE_PROVIDER_SITE_OTHER): Payer: PPO

## 2017-05-04 ENCOUNTER — Ambulatory Visit (INDEPENDENT_AMBULATORY_CARE_PROVIDER_SITE_OTHER): Payer: PPO | Admitting: Family

## 2017-05-04 DIAGNOSIS — M546 Pain in thoracic spine: Secondary | ICD-10-CM

## 2017-05-04 NOTE — Progress Notes (Signed)
   Office Visit Note   Patient: Wendy Watts           Date of Birth: Mar 11, 1946           MRN: [id number] Visit Date: 05/04/2017              Requested by: [name], [place], [place] [address] PCP: [name], MD  Chief Complaint  Patient presents with  . Lower Back - Follow-up      HPI: The patient is a 71 year old woman who presents today in follow-up for sciatic nerve pain with radicular symptoms on the left. States this has resolved today complains of some upper mid back pain feels pulling and muscular pain around to her ribs. She has been taking naproxen for her lower back states that she doesn't  feel herself complains of some nausea.  No numbness, tingling, radicular symptoms.   Assessment & Plan: Visit Diagnoses:  1. Pain in thoracic spine     Plan: Recommended physical therapy to work on spine strengthening and postural strengthening patient to go to the physical therapy location of her choice. She'll stop the naproxen. Follow-up in office as needed.  Follow-Up Instructions: No Follow-up on file.   Back Exam   Tenderness  The patient is experiencing tenderness in the thoracic (soft tissue tenderness as well).  Range of Motion  The patient has normal back ROM.  Tests  Straight leg raise right: negative Straight leg raise left: negative  Other  Gait: normal       Patient is alert, oriented, no adenopathy, well-dressed, normal affect, normal respiratory effort.   Imaging: No results found. No images are attached to the encounter.  Labs: No results found for: HGBA1C, ESRSEDRATE, CRP, LABURIC, REPTSTATUS, GRAMSTAIN, CULT, LABORGA  Orders:  Orders Placed This Encounter  Procedures  . XR Thoracic Spine 2 View   No orders of the defined types were placed in this encounter.    Procedures: No procedures performed  Clinical Data: No additional findings.  ROS:  All other systems negative, except as noted in  the HPI. Review of Systems  Constitutional: Negative for chills and fever.  Musculoskeletal: Positive for back pain.  Neurological: Negative for weakness and numbness.    Objective: Vital Signs: There were no vitals taken for this visit.  Specialty Comments:  No specialty comments available.  PMFS History: There are no active problems to display for this patient.  Past Medical History:  Diagnosis Date  . High cholesterol   . Vertigo     History reviewed. No pertinent family history.  Past Surgical History:  Procedure Laterality Date  . TOTAL KNEE ARTHROPLASTY Right    Social History   Occupational History  . Not on file.   Social History Main Topics  . Smoking status: Never Smoker  . Smokeless tobacco: Never Used  . Alcohol use Yes  . Drug use: No  . Sexual activity: Not on file

## 2017-05-07 ENCOUNTER — Emergency Department (HOSPITAL_COMMUNITY)
Admission: EM | Admit: 2017-05-07 | Discharge: 2017-05-07 | Discharge status: Against Medical Advice | Payer: PPO | Attending: Emergency Medicine | Admitting: Emergency Medicine

## 2017-05-07 ENCOUNTER — Encounter (HOSPITAL_COMMUNITY): Payer: Self-pay | Admitting: Emergency Medicine

## 2017-05-07 DIAGNOSIS — R319 Hematuria, unspecified: Secondary | ICD-10-CM | POA: Diagnosis present

## 2017-05-07 DIAGNOSIS — Z5321 Procedure and treatment not carried out due to patient leaving prior to being seen by health care provider: Secondary | ICD-10-CM | POA: Insufficient documentation

## 2017-05-07 LAB — URINALYSIS, ROUTINE W REFLEX MICROSCOPIC
Bilirubin Urine: NEGATIVE
Glucose, UA: NEGATIVE mg/dL
Ketones, ur: NEGATIVE mg/dL
Leukocytes, UA: NEGATIVE
Nitrite: NEGATIVE
Protein, ur: NEGATIVE mg/dL
Specific Gravity, Urine: <1.005 — ABNORMAL LOW (ref 1.005–1.030)
pH: 7 (ref 5.0–8.0)

## 2017-05-07 LAB — URINALYSIS, MICROSCOPIC (REFLEX)
Bacteria, UA: NONE SEEN
WBC, UA: NONE SEEN WBC/hpf (ref 0–5)

## 2017-05-07 NOTE — ED Triage Notes (Signed)
Pt reports she began to have blood in her urine earlier today. Denies any pain or dysuria

## 2017-05-29 DIAGNOSIS — C4441 Basal cell carcinoma of skin of scalp and neck: Secondary | ICD-10-CM | POA: Diagnosis not present

## 2017-05-29 DIAGNOSIS — D225 Melanocytic nevi of trunk: Secondary | ICD-10-CM | POA: Diagnosis not present

## 2017-05-29 DIAGNOSIS — L814 Other melanin hyperpigmentation: Secondary | ICD-10-CM | POA: Diagnosis not present

## 2017-05-29 DIAGNOSIS — D1801 Hemangioma of skin and subcutaneous tissue: Secondary | ICD-10-CM | POA: Diagnosis not present

## 2017-05-29 DIAGNOSIS — Z85828 Personal history of other malignant neoplasm of skin: Secondary | ICD-10-CM | POA: Diagnosis not present

## 2017-05-29 DIAGNOSIS — D692 Other nonthrombocytopenic purpura: Secondary | ICD-10-CM | POA: Diagnosis not present

## 2017-05-29 DIAGNOSIS — L821 Other seborrheic keratosis: Secondary | ICD-10-CM | POA: Diagnosis not present

## 2017-06-05 DIAGNOSIS — L821 Other seborrheic keratosis: Secondary | ICD-10-CM | POA: Diagnosis not present

## 2017-06-05 DIAGNOSIS — Z85828 Personal history of other malignant neoplasm of skin: Secondary | ICD-10-CM | POA: Diagnosis not present

## 2017-06-05 DIAGNOSIS — C4441 Basal cell carcinoma of skin of scalp and neck: Secondary | ICD-10-CM | POA: Diagnosis not present

## 2017-09-15 ENCOUNTER — Other Ambulatory Visit: Payer: Self-pay | Admitting: Family Medicine

## 2017-09-15 ENCOUNTER — Ambulatory Visit (INDEPENDENT_AMBULATORY_CARE_PROVIDER_SITE_OTHER): Payer: PPO | Admitting: Family

## 2017-09-15 DIAGNOSIS — Z1231 Encounter for screening mammogram for malignant neoplasm of breast: Secondary | ICD-10-CM

## 2017-09-20 ENCOUNTER — Ambulatory Visit (INDEPENDENT_AMBULATORY_CARE_PROVIDER_SITE_OTHER): Payer: PPO

## 2017-09-20 ENCOUNTER — Ambulatory Visit (INDEPENDENT_AMBULATORY_CARE_PROVIDER_SITE_OTHER): Payer: PPO | Admitting: Family

## 2017-09-20 ENCOUNTER — Encounter (INDEPENDENT_AMBULATORY_CARE_PROVIDER_SITE_OTHER): Payer: Self-pay | Admitting: Family

## 2017-09-20 VITALS — Ht 65.0 in | Wt 148.0 lb

## 2017-09-20 DIAGNOSIS — M19031 Primary osteoarthritis, right wrist: Secondary | ICD-10-CM | POA: Diagnosis not present

## 2017-09-20 DIAGNOSIS — M25531 Pain in right wrist: Secondary | ICD-10-CM

## 2017-09-20 NOTE — Progress Notes (Signed)
   Office Visit Note   Patient: Wendy Watts           Date of Birth: [birth year]           MRN: [id number] Visit Date: 09/20/2017              Requested by: [name], [place], [place] [address] PCP: [name], MD  Chief Complaint  Patient presents with  . Right Wrist - Pain      HPI: The patient is a 72 year old woman who presented complaining of right wrist pain.  She points to the ulnar styloid as tender area she is concerned for knobbiness to her wrist and some swelling.  She states she fell several months ago is wondering if she broke something back pain.  No swelling no redness no difficulty with activities of daily living.  Does relate she has tried some topical anti-inflammatory cream from a friend which was helpful  Assessment & Plan: Visit Diagnoses:  1. Pain in right wrist     Plan: Reassurance provided.  Did offer a wrist splint.  Patient declined at this time.  Offered anti-inflammatory cream given her a sample of [place].  Discussed that she may get Aspercreme over-the-counter and try this.  Follow-Up Instructions: No Follow-up on file.   Right Hand Exam  Right hand exam is normal.  Tenderness  Right hand tenderness location: Very mild tenderness of her ulnar styloid, most tender at the [facility] joint and to.  Range of Motion  The patient has normal right wrist ROM.   Muscle Strength  The patient has normal right wrist strength.  Other  Erythema: absent Sensation: normal Pulse: present      Patient is alert, oriented, no adenopathy, well-dressed, normal affect, normal respiratory effort. No bony abnormality.  Bilateral wrists are symmetric  Imaging: No results found. No images are attached to the encounter.  Labs: No results found for: HGBA1C, ESRSEDRATE, CRP, LABURIC, REPTSTATUS, GRAMSTAIN, CULT, LABORGA  @LABSALLVALUES (HGBA1)@  Body mass index is 24.63 kg/m.  Orders:  Orders Placed This Encounter    Procedures  . XR Wrist 2 Views Right   No orders of the defined types were placed in this encounter.    Procedures: No procedures performed  Clinical Data: No additional findings.  ROS:  All other systems negative, except as noted in the HPI. Review of Systems  Constitutional: Negative for chills and fever.  Musculoskeletal: Positive for arthralgias. Negative for joint swelling and myalgias.    Objective: Vital Signs: Ht 5\' 5"  (1.651 m)   Wt 148 lb (67.1 kg)   BMI 24.63 kg/m   Specialty Comments:  No specialty comments available.  PMFS History: There are no active problems to display for this patient.  Past Medical History:  Diagnosis Date  . High cholesterol   . Vertigo     No family history on file.  Past Surgical History:  Procedure Laterality Date  . TOTAL KNEE ARTHROPLASTY Right    Social History   Occupational History  . Not on file  Tobacco Use  . Smoking status: Never Smoker  . Smokeless tobacco: Never Used  Substance and Sexual Activity  . Alcohol use: Yes  . Drug use: No  . Sexual activity: Not on file

## 2017-10-10 DIAGNOSIS — F411 Generalized anxiety disorder: Secondary | ICD-10-CM | POA: Diagnosis not present

## 2017-10-16 ENCOUNTER — Ambulatory Visit
Admission: RE | Admit: 2017-10-16 | Discharge: 2017-10-16 | Disposition: A | Discharge status: Home / Self Care | Payer: PPO | Source: Ambulatory Visit | Attending: Family Medicine | Admitting: Family Medicine

## 2017-10-16 DIAGNOSIS — Z1231 Encounter for screening mammogram for malignant neoplasm of breast: Secondary | ICD-10-CM | POA: Diagnosis not present

## 2017-10-16 DIAGNOSIS — L218 Other seborrheic dermatitis: Secondary | ICD-10-CM | POA: Diagnosis not present

## 2017-10-16 DIAGNOSIS — Z85828 Personal history of other malignant neoplasm of skin: Secondary | ICD-10-CM | POA: Diagnosis not present

## 2017-10-16 DIAGNOSIS — L821 Other seborrheic keratosis: Secondary | ICD-10-CM | POA: Diagnosis not present

## 2017-10-16 DIAGNOSIS — L738 Other specified follicular disorders: Secondary | ICD-10-CM | POA: Diagnosis not present

## 2018-01-04 DIAGNOSIS — F339 Major depressive disorder, recurrent, unspecified: Secondary | ICD-10-CM | POA: Diagnosis not present

## 2018-01-04 DIAGNOSIS — R5383 Other fatigue: Secondary | ICD-10-CM | POA: Diagnosis not present

## 2018-03-02 DIAGNOSIS — M26609 Unspecified temporomandibular joint disorder, unspecified side: Secondary | ICD-10-CM | POA: Diagnosis not present

## 2018-03-02 DIAGNOSIS — R5383 Other fatigue: Secondary | ICD-10-CM | POA: Diagnosis not present

## 2018-03-02 DIAGNOSIS — H6983 Other specified disorders of Eustachian tube, bilateral: Secondary | ICD-10-CM | POA: Diagnosis not present

## 2018-03-02 DIAGNOSIS — F411 Generalized anxiety disorder: Secondary | ICD-10-CM | POA: Diagnosis not present

## 2018-03-02 DIAGNOSIS — J309 Allergic rhinitis, unspecified: Secondary | ICD-10-CM | POA: Diagnosis not present

## 2018-04-30 DIAGNOSIS — J309 Allergic rhinitis, unspecified: Secondary | ICD-10-CM | POA: Diagnosis not present

## 2018-04-30 DIAGNOSIS — F411 Generalized anxiety disorder: Secondary | ICD-10-CM | POA: Diagnosis not present

## 2018-04-30 DIAGNOSIS — F339 Major depressive disorder, recurrent, unspecified: Secondary | ICD-10-CM | POA: Diagnosis not present

## 2018-09-06 DIAGNOSIS — Z86010 Personal history of colonic polyps: Secondary | ICD-10-CM | POA: Diagnosis not present

## 2018-09-06 DIAGNOSIS — K5730 Diverticulosis of large intestine without perforation or abscess without bleeding: Secondary | ICD-10-CM | POA: Diagnosis not present

## 2018-09-07 ENCOUNTER — Other Ambulatory Visit: Payer: Self-pay | Admitting: Family Medicine

## 2018-09-07 DIAGNOSIS — Z1231 Encounter for screening mammogram for malignant neoplasm of breast: Secondary | ICD-10-CM

## 2018-11-12 ENCOUNTER — Inpatient Hospital Stay: Admission: RE | Admit: 2018-11-12 | Payer: PPO | Source: Ambulatory Visit

## 2018-11-14 DIAGNOSIS — H6980 Other specified disorders of Eustachian tube, unspecified ear: Secondary | ICD-10-CM | POA: Diagnosis not present

## 2018-11-14 DIAGNOSIS — J309 Allergic rhinitis, unspecified: Secondary | ICD-10-CM | POA: Diagnosis not present

## 2018-12-11 ENCOUNTER — Ambulatory Visit: Payer: PPO

## 2019-01-29 ENCOUNTER — Ambulatory Visit: Payer: PPO

## 2019-04-04 DIAGNOSIS — F0390 Unspecified dementia without behavioral disturbance: Secondary | ICD-10-CM | POA: Diagnosis not present

## 2019-05-02 DIAGNOSIS — F0390 Unspecified dementia without behavioral disturbance: Secondary | ICD-10-CM | POA: Diagnosis not present

## 2019-05-02 DIAGNOSIS — Z131 Encounter for screening for diabetes mellitus: Secondary | ICD-10-CM | POA: Diagnosis not present

## 2019-05-02 DIAGNOSIS — Z0000 Encounter for general adult medical examination without abnormal findings: Secondary | ICD-10-CM | POA: Diagnosis not present

## 2019-05-02 DIAGNOSIS — M899 Disorder of bone, unspecified: Secondary | ICD-10-CM | POA: Diagnosis not present

## 2019-05-02 DIAGNOSIS — R944 Abnormal results of kidney function studies: Secondary | ICD-10-CM | POA: Diagnosis not present

## 2019-05-02 DIAGNOSIS — J309 Allergic rhinitis, unspecified: Secondary | ICD-10-CM | POA: Diagnosis not present

## 2019-05-02 DIAGNOSIS — E559 Vitamin D deficiency, unspecified: Secondary | ICD-10-CM | POA: Diagnosis not present

## 2019-05-02 DIAGNOSIS — E785 Hyperlipidemia, unspecified: Secondary | ICD-10-CM | POA: Diagnosis not present

## 2019-05-08 ENCOUNTER — Encounter: Payer: Self-pay | Admitting: Neurology

## 2019-05-08 ENCOUNTER — Telehealth: Payer: Self-pay | Admitting: Neurology

## 2019-05-08 ENCOUNTER — Other Ambulatory Visit: Payer: Self-pay | Admitting: Neurology

## 2019-05-08 ENCOUNTER — Ambulatory Visit (INDEPENDENT_AMBULATORY_CARE_PROVIDER_SITE_OTHER): Payer: PPO | Admitting: Neurology

## 2019-05-08 ENCOUNTER — Encounter: Payer: Self-pay | Admitting: *Deleted

## 2019-05-08 ENCOUNTER — Other Ambulatory Visit: Payer: Self-pay

## 2019-05-08 VITALS — BP 134/80 | HR 68 | Temp 97.7°F | Ht 64.0 in | Wt 147.0 lb

## 2019-05-08 DIAGNOSIS — R410 Disorientation, unspecified: Secondary | ICD-10-CM

## 2019-05-08 DIAGNOSIS — F0390 Unspecified dementia without behavioral disturbance: Secondary | ICD-10-CM | POA: Insufficient documentation

## 2019-05-08 DIAGNOSIS — F03918 Unspecified dementia, unspecified severity, with other behavioral disturbance: Secondary | ICD-10-CM | POA: Insufficient documentation

## 2019-05-08 DIAGNOSIS — R4189 Other symptoms and signs involving cognitive functions and awareness: Secondary | ICD-10-CM | POA: Diagnosis not present

## 2019-05-08 DIAGNOSIS — G309 Alzheimer's disease, unspecified: Secondary | ICD-10-CM

## 2019-05-08 MED ORDER — DONEPEZIL HCL 5 MG PO TABS: 5.0000 mg | ORAL_TABLET | Freq: Every day | ORAL | 6 refills | Status: DC

## 2019-05-08 MED ORDER — DONEPEZIL HCL 5 MG PO TABS: 5.0000 mg | ORAL_TABLET | Freq: Every day | ORAL | 0 refills | Status: DC

## 2019-05-08 NOTE — Telephone Encounter (Signed)
health team order sent to GI. No auth they will reach out to the patient to schedule.  

## 2019-05-08 NOTE — Patient Instructions (Addendum)
MR of the brain  Blood work    Dementia Dementia is a condition that affects the way the brain functions. It often affects memory and thinking. Usually, dementia gets worse with time and cannot be reversed (progressive dementia). There are many types of dementia, including:  Alzheimer's disease. This type is the most common.  Vascular dementia. This type may happen as the result of a stroke.  Lewy body dementia. This type may happen to people who have Parkinson's disease.  Frontotemporal dementia. This type is caused by damage to nerve cells (neurons) in certain parts of the brain. Some people may be affected by more than one type of dementia. This is called mixed dementia. What are the causes? Dementia is caused by damage to cells in the brain. The area of the brain and the types of cells damaged determine the type of dementia. Usually, this damage is irreversible or cannot be undone. Some examples of irreversible causes include:  Conditions that affect the blood vessels of the brain, such as diabetes, heart disease, or blood vessel disease.  Genetic mutations. In some cases, changes in the brain may be caused by another condition and can be reversed or slowed. Some examples of reversible causes include:  Injury to the brain.  Certain medicines.  Infection, such as meningitis.  Metabolic problems, such as vitamin B12 deficiency or thyroid disease.  Pressure on the brain, such as from a tumor or blood clot. What are the signs or symptoms? Symptoms of dementia depend on the type of dementia. Common signs of dementia include problems with remembering, thinking, problem solving, decision making, and communicating. These signs develop slowly or get worse with time. This may include:  Problems remembering things.  Having trouble taking a bath or putting clothes on.  Forgetting appointments.  Forgetting to pay bills.  Difficulty planning and preparing meals.  Having trouble  speaking.  Getting lost easily. How is this diagnosed? This condition is diagnosed by a specialist (neurologist). It is diagnosed based on the history of your symptoms, your medical history, a physical exam, and tests. Tests may include:  Tests to evaluate brain function, such as memory tests, cognitive tests, and other tests.  Lab tests, such as blood or urine tests.  Imaging tests, such as a CT scan, a PET scan, or an MRI.  Genetic testing. This may be done if other family members have a diagnosis of certain types of dementia. Your health care provider will talk with you and your family, friends, or caregivers about your history and symptoms. How is this treated?  Treatment for this condition depends on the cause of the dementia. Progressive dementias, such as Alzheimer's disease, cannot be cured, but there may be treatments that help to manage symptoms. Treatment might involve taking medicines that may help to:  Control the dementia.  Slow down the progression of the dementia.  Manage symptoms. In some cases, treating the cause of your dementia can improve symptoms, reverse symptoms, or slow down how quickly your dementia becomes worse. Your health care provider can direct you to support groups, organizations, and other health care providers who can help with decisions about your care. Follow these instructions at home: Medicines  Take over-the-counter and prescription medicines only as told by your health care provider.  Use a pill organizer or pill reminder to help you manage your medicines.  Avoid taking medicines that can affect thinking, such as pain medicines or sleeping medicines. Lifestyle  Make healthy lifestyle choices. ? Be physically  active as told by your health care provider. ? Do not use any products that contain nicotine or tobacco, such as cigarettes, e-cigarettes, and chewing tobacco. If you need help quitting, ask your health care provider. ? Do not drink  alcohol. ? Practice stress-management techniques when you get stressed. ? Spend time with other people.  Make sure to get quality sleep. These tips can help you get a good night's rest: ? Avoid napping during the day. ? Keep your sleeping area dark and cool. ? Avoid exercising during the few hours before you go to bed. ? Avoid caffeine products in the evening. Eating and drinking  Drink enough fluid to keep your urine pale yellow.  Eat a healthy diet. General instructions   Work with your health care provider to determine what you need help with and what your safety needs are.  Talk with your health care provider about whether it is safe for you to drive.  If you were given a bracelet that identifies you as a person with memory loss or tracks your location, make sure to wear it at all times.  Work with your family to make important decisions, such as advance directives, medical power of attorney, or a living will.  Keep all follow-up visits as told by your health care provider. This is important. Where to find more information  Alzheimer's Association: CapitalMile.co.nz  National Institute on Aging: DVDEnthusiasts.nl  World Health Organization: RoleLink.com.br Contact a health care provider if:  You have any new or worsening symptoms.  You have problems with choking or swallowing. Get help right away if:  You feel depressed or sad, or feel that you want to harm yourself.  Your family members become concerned for your safety. If you ever feel like you may hurt yourself or others, or have thoughts about taking your own life, get help right away. You can go to your nearest emergency department or call:  Your local emergency services (911 in the [place]  A suicide crisis helpline, such as the [facility] at 346-013-2172. This is open 24 hours a day. Summary  Dementia is a condition that affects the way the brain functions. Dementia often affects  memory and thinking.  Usually, dementia gets worse with time and cannot be reversed (progressive dementia).  Treatment for this condition depends on the cause of the dementia.  Work with your health care provider to determine what you need help with and what your safety needs are.  Your health care provider can direct you to support groups, organizations, and other health care providers who can help with decisions about your care. This information is not intended to replace advice given to you by your health care provider. Make sure you discuss any questions you have with your health care provider. Document Released: 01/25/2001 Document Revised: 10/16/2018 Document Reviewed: 10/16/2018 Elsevier Patient Education  2020 [employer].

## 2019-05-08 NOTE — Progress Notes (Signed)
[id number]    Provider:  Dr Jaynee Eagles Requesting Provider: [name], MD Primary Care Provider:  [name], MD  CC:  Cognitive changes  HPI:  Wendy Watts is a 73 y.o. female here as requested by [name], MD for cognitive changes so significant they are disrupting her financial affairs.  Past medical history migraine, hyperlipidemia, dementia most consistent with Alzheimer's type, anxiety, high risk medication use, osteopenia, depression anxety. Nice patient here today with daughter. Daughter knew memory problems but was not that aware until the car was repossessed after her son took all her money and then discovered many bills not paid or paid incorrectly, check not dated, house bill not paid in eight months, bounced checks due to money missing. MMSE 15/30. She lives in [place], She lives with her husband of 36 years, have lived in the house since the kids were little. No falls at home. Several years of memory/cognotive changes, slowly progressive. Patient says she can cook at home, it takes her longer to cook these days, she fixed chili beans the other night, but daughter says patient doesn't cook a lot and she goes to daughter's house to cook. They are trying to get their house out of foreclosure,  Arrested 2 weeks ago for stealing, house in foreclosure, wife was supposed to be paying the bills and had debits setup, wasn't aware, son hid statements and changed phone numbers. When the daughter found out, son was arrested 2 weeks ago, manipulated mother. Son is back living in the house and they refuse to evict him. No memory problems with husband. Patient does not think she has cognitive issues. Daughter thought it was just mother getting older, she sees them once or twice a week so we didn't notice, she was doing things like forgetting where she was sitting, would forget things, short-term memory progressively getting worse, she doesn't drive, husband  drives. Repetitive conversations, telling same stories int he same day. No known family history.   Reviewed notes, labs and imaging from outside physicians, which showed:  I reviewed Dr. Alan Ripper notes.  Daughters concerned for dementia.  Progressive symptoms, disrupting her life, causing financial difficulties.  Daughter is very concerned about dementia, given history [name] believes this is most concerning for Alzheimer's type.  Last labs were collected Jan 04, 2018 which includes normal CBC, TSH 0.92.  Review of Systems: Patient complains of symptoms per HPI as well as the following symptoms: memory changes. Pertinent negatives and positives per HPI. All others negative.   Social History   Socioeconomic History   Marital status: Married    Spouse name: Not on file   Number of children: 2   Years of education: Not on file   Highest education level: High school graduate  Occupational History   Not on file  Social Needs   Financial resource strain: Not on file   Food insecurity    Worry: Not on file    Inability: Not on file   Transportation needs    Medical: Not on file    Non-medical: Not on file  Tobacco Use   Smoking status: Never Smoker   Smokeless tobacco: Never Used  Substance and Sexual Activity   Alcohol use: Never    Frequency: Never   Drug use: Never   Sexual activity: Not on file  Lifestyle   Physical activity    Days per week: Not on file    Minutes per session: Not on file  Stress: Not on file  Relationships   Social connections    Talks on phone: Not on file    Gets together: Not on file    Attends religious service: Not on file    Active member of club or organization: Not on file    Attends meetings of clubs or organizations: Not on file    Relationship status: Not on file   Intimate partner violence    Fear of current or ex partner: Not on file    Emotionally abused: Not on file    Physically abused: Not on file    Forced  sexual activity: Not on file  Other Topics Concern   Not on file  Social History Narrative   [place] at home with husband and son    Family History  Problem Relation Age of Onset   Dementia Neg Hx    Alzheimer's disease Neg Hx     Past Medical History:  Diagnosis Date   Anxiety    Dementia ([place])    High cholesterol    Vertigo     Patient Active Problem List   Diagnosis Date Noted   Dementia without behavioral disturbance ([place]) 05/08/2019    Past Surgical History:  Procedure Laterality Date   HYSTERECTOMY     TOTAL KNEE ARTHROPLASTY Right     Current Outpatient Medications  Medication Sig Dispense Refill   fluticasone (FLONASE) 50 MCG/ACT nasal spray Place 2 sprays into both nostrils daily as needed for allergies or rhinitis.     Loratadine (CLARITIN PO) Take by mouth.     Multiple Vitamin (MULTIVITAMIN PO) Take by mouth.     ALPRAZolam (XANAX) 0.5 MG tablet Take 0.5 mg by mouth at bedtime as needed for sleep.     buPROPion (WELLBUTRIN XL) 150 MG 24 hr tablet TK 1 T PO ONCE A DAY IN THE MORNING  4   donepezil (ARICEPT) 5 MG tablet Take 1 tablet (5 mg total) by mouth at bedtime. 30 tablet 6   lovastatin (MEVACOR) 40 MG tablet Take 80 mg by mouth every evening.      naproxen (NAPROSYN) 500 MG tablet TAKE 1 TABLET(500 MG) BY MOUTH TWICE DAILY WITH A MEAL (Patient not taking: Reported on 05/08/2019) 180 tablet 0   ondansetron (ZOFRAN-ODT) 4 MG disintegrating tablet DIS 1 T ON THE TONGUE Q 8 H PRF NAUSEA OR VOM  1   No current facility-administered medications for this visit.     Allergies as of 05/08/2019 - Review Complete 05/08/2019  Allergen Reaction Noted   Allegra [fexofenadine hcl] Other (See Comments) 08/04/2013   Ativan [lorazepam] Other (See Comments) 08/04/2013   Azithromycin Nausea And Vomiting 03/22/2012   Codeine Hives 03/22/2012   Doxycycline hyclate Nausea Only 04/14/2012   Penicillins Hives 03/22/2012   Trazodone and nefazodone  Nausea Only 04/14/2012   Zyrtec [cetirizine] Other (See Comments) 08/04/2013    Vitals: BP 134/80 (BP Location: Right Arm, Patient Position: Sitting)    Pulse 68    Temp 97.7 F (36.5 C) Comment: daughter 97.8   Ht 5\' 4"  (1.626 m)    Wt 147 lb (66.7 kg)    BMI 25.23 kg/m  Last Weight:  Wt Readings from Last 1 Encounters:  05/08/19 147 lb (66.7 kg)   Last Height:   Ht Readings from Last 1 Encounters:  05/08/19 5\' 4"  (1.626 m)     Physical exam: Exam: Gen: NAD, conversant, well nourised, well groomed  CV: RRR, no MRG. No Carotid Bruits. No peripheral edema, warm, nontender Eyes: Conjunctivae clear without exudates or hemorrhage  Neuro: Detailed Neurologic Exam  Speech:    Speech is normal; fluent and spontaneous with normal comprehension.  Cognition: MMSE - Mini Mental State Exam 05/08/2019  Orientation to time 1  Orientation to Place 3  Registration 3  Attention/ Calculation 0  Recall 2  Language- name 2 objects 2  Language- repeat 0  Language- follow 3 step command 3  Language- read & follow direction 1  Write a sentence 0  Copy design 0  Total score 15       The patient is oriented to person, place    recent and remote memory impaired;     language fluent;     Impaired attention, concentration, fund of knowledge Cranial Nerves:    The pupils are equal, round, and reactive to light. The fundi are flat. Visual fields are full to finger confrontation. Extraocular movements are intact. Trigeminal sensation is intact and the muscles of mastication are normal. The face is symmetric. The palate elevates in the midline. Hearing intact. Voice is normal. Shoulder shrug is normal. The tongue has normal motion without fasciculations.   Coordination:    Normal finger to nose and heel to shin.   Gait:    Normal Native Gait  Motor Observation:    No asymmetry, no atrophy, and no involuntary movements noted. Tone:    Normal muscle tone.    Posture:     Posture is normal. normal erect    Strength:    Strength is V/V in the upper and lower limbs.      Sensation: intact to LT     Reflex Exam:  DTR's:    Deep tendon reflexes in the upper and lower extremities are symmetrical bilaterally.   Toes:    The toes are equivocal bilaterally.   Clonus:    Clonus is absent.    Assessment/Plan:  Really lovely 73 year old with dementia, likely of the Alzheimer's type. Discussed with daughter at length, disease is progressive, sill start Aricept at low dose, f/u 6 months. Wrote her a letter for bank, house is in default. Will evaluate for other causes of dementia.   Orders Placed This Encounter  Procedures   MR BRAIN W WO CONTRAST   B12 and Folate Panel   Methylmalonic acid, serum   CBC   Comprehensive metabolic panel   TSH   Vitamin B1   RPR   Homocysteine   Meds ordered this encounter  Medications   DISCONTD: donepezil (ARICEPT) 5 MG tablet    Sig: Take 1 tablet (5 mg total) by mouth at bedtime.    Dispense:  30 tablet    Refill:  0   donepezil (ARICEPT) 5 MG tablet    Sig: Take 1 tablet (5 mg total) by mouth at bedtime.    Dispense:  30 tablet    Refill:  6   Today's history and physical demonstrated very substantial and measurable cognitive losses consistent with dementia. Based on the prior experiences in the community and the substantial degree of impairment it is clear that she does not have the capacity to make informed and appropriate decisions on her healthcare and finances. I do recommend that she lives in a structured setting or with someone caring for her. It is also clear that patient does not comprehend the degree of cognitive losses or the risks that she has been in with her cognitive  deficits. She needs someone to manage her financial affairs and someone who can look out for the best interests of patient who is suffering from substantial cognitive impairment due to dementia.   Cc: [name],  MD  Sarina Ill, MD  [facility] [address] [place], [place] [address]  Phone [phone] Fax [phone]

## 2019-05-19 LAB — CBC
Hematocrit: 41.4 % (ref 34.0–46.6)
Hemoglobin: 13.7 g/dL (ref 11.1–15.9)
MCH: 30.4 pg (ref 26.6–33.0)
MCHC: 33.1 g/dL (ref 31.5–35.7)
MCV: 92 fL (ref 79–97)
Platelets: 357 10*3/uL (ref 150–450)
RBC: 4.51 x10E6/uL (ref 3.77–5.28)
RDW: 12.5 % (ref 11.7–15.4)
WBC: 6.1 10*3/uL (ref 3.4–10.8)

## 2019-05-19 LAB — METHYLMALONIC ACID, SERUM: Methylmalonic Acid: 288 nmol/L (ref 0–378)

## 2019-05-19 LAB — COMPREHENSIVE METABOLIC PANEL
ALT: 9 IU/L (ref 0–32)
AST: 13 IU/L (ref 0–40)
Albumin/Globulin Ratio: 1.5 (ref 1.2–2.2)
Albumin: 4.1 g/dL (ref 3.7–4.7)
Alkaline Phosphatase: 91 IU/L (ref 39–117)
BUN/Creatinine Ratio: 12 (ref 12–28)
BUN: 11 mg/dL (ref 8–27)
Bilirubin Total: 0.6 mg/dL (ref 0.0–1.2)
CO2: 24 mmol/L (ref 20–29)
Calcium: 10.3 mg/dL (ref 8.7–10.3)
Chloride: 106 mmol/L (ref 96–106)
Creatinine, Ser: 0.91 mg/dL (ref 0.57–1.00)
GFR calc Af Amer: 72 mL/min/{1.73_m2} (ref >59)
GFR calc non Af Amer: 63 mL/min/{1.73_m2} (ref >59)
Globulin, Total: 2.7 g/dL (ref 1.5–4.5)
Glucose: 97 mg/dL (ref 65–99)
Potassium: 4.5 mmol/L (ref 3.5–5.2)
Sodium: 142 mmol/L (ref 134–144)
Total Protein: 6.8 g/dL (ref 6.0–8.5)

## 2019-05-19 LAB — HOMOCYSTEINE: Homocysteine: 14.8 umol/L (ref 0.0–19.2)

## 2019-05-19 LAB — B12 AND FOLATE PANEL
Folate: 12.9 ng/mL (ref >3.0)
Vitamin B-12: 287 pg/mL (ref 232–1245)

## 2019-05-19 LAB — VITAMIN B1: Thiamine: 118.7 nmol/L (ref 66.5–200.0)

## 2019-05-19 LAB — TSH: TSH: 0.92 u[IU]/mL (ref 0.450–4.500)

## 2019-05-19 LAB — RPR: RPR Ser Ql: NONREACTIVE

## 2019-05-21 ENCOUNTER — Telehealth: Payer: Self-pay | Admitting: *Deleted

## 2019-05-21 NOTE — Telephone Encounter (Signed)
Noted thanks °

## 2019-05-21 NOTE — Telephone Encounter (Signed)
Pt called with her husband, there message from [place] was relayed to pt.  Pt was encouraged to come to the office and complete a DPR so that whoever she would like for [name] to be able to relay information to could be spoken to.  Pt okayed and her appointment information re: her MRI was confirmed with husband

## 2019-05-21 NOTE — Telephone Encounter (Signed)
-----   Message from Melvenia Beam, MD sent at 05/19/2019  1:56 PM EDT ----- Lab work is normal thanks

## 2019-05-21 NOTE — Telephone Encounter (Signed)
Called pt on home #, vm not setup. Called pt on mobile #, LVM asking for call back.   When the pt calls back, please let her know her lab work is normal.

## 2019-05-22 ENCOUNTER — Telehealth: Payer: Self-pay | Admitting: *Deleted

## 2019-05-22 NOTE — Telephone Encounter (Signed)
-----   Message from Melvenia Beam, MD sent at 05/19/2019  1:56 PM EDT ----- Lab work is normal thanks

## 2019-05-22 NOTE — Telephone Encounter (Signed)
Error

## 2019-05-29 ENCOUNTER — Ambulatory Visit
Admission: RE | Admit: 2019-05-29 | Discharge: 2019-05-29 | Disposition: A | Discharge status: Home / Self Care | Payer: PPO | Source: Ambulatory Visit | Attending: Neurology | Admitting: Neurology

## 2019-05-29 ENCOUNTER — Other Ambulatory Visit: Payer: Self-pay

## 2019-05-29 DIAGNOSIS — R4189 Other symptoms and signs involving cognitive functions and awareness: Secondary | ICD-10-CM

## 2019-05-29 DIAGNOSIS — F0390 Unspecified dementia without behavioral disturbance: Secondary | ICD-10-CM | POA: Diagnosis not present

## 2019-05-29 DIAGNOSIS — R410 Disorientation, unspecified: Secondary | ICD-10-CM | POA: Diagnosis not present

## 2019-05-29 DIAGNOSIS — G309 Alzheimer's disease, unspecified: Secondary | ICD-10-CM

## 2019-05-29 MED ORDER — GADOBENATE DIMEGLUMINE 529 MG/ML IV SOLN
13.0000 mL | Freq: Once | INTRAVENOUS | Status: AC | PRN
  Administered 2019-05-29: 13 mL via INTRAVENOUS

## 2019-06-02 ENCOUNTER — Telehealth: Payer: Self-pay | Admitting: Neurology

## 2019-06-02 NOTE — Telephone Encounter (Signed)
See result note, MRI with medial temporal atrophy suspicious for alzheimer dementia. If she can tolerate, may try increasing aricept

## 2019-06-04 NOTE — Telephone Encounter (Signed)
I spoke with pt's husband Abe People and discuss MRI brain results and recommendation to increase Aricept to 10 mg QHS if tolerating well with no s/e. He stated he hasn't seen pt take any medications. She takes no regular medications, only a vitamin and stated other meds were stopped by her doctor. He will call [facility] to pickup the Aricept. I asked for him to call us back in 2 weeks to discuss the aricept dose, sooner if needed. He verbalized ppreciation. He understands pt is to take 5 mg daily at bedtime.

## 2019-06-04 NOTE — Telephone Encounter (Signed)
Notes recorded by Melvenia Beam, MD on 06/02/2019 at 7:22 PM EDT  MRI of the brain shows atrophy very suspicious for Alzheimer's dementia. I would suggest increasing Aricept to 10mg  if she is not having any side effects to the 5mg  dose thanks

## 2019-06-04 NOTE — Telephone Encounter (Signed)
I spoke with Jefferey Pica (on DPR), pt's husband. He was on his way home. I told him I would call back later today to discuss results. He verbalized appreciation. He also said he brought an updated DPR form last week that has the daughter on it.

## 2019-06-06 NOTE — Telephone Encounter (Signed)
Pt's daughter Isaias Sakai, not on DPR called stating that her father does not remember what was said to him and she is calling to get the results. Please advise.

## 2019-06-10 NOTE — Telephone Encounter (Signed)
I spoke with pt's husband and advised unfortunately we do not have a DPR on file allowing [name] to discuss with Wendy Watts. He stated he had brought a DPR that pt signed. I was not able to locate it at the time of the call. I advised him I would check with other staff tomorrow and call him back. He offered to bring pt to sign another DPR if needed. He stated he had no questions at this time. He confirmed pt started the Aricept on Wed 10/21 and has been taking it every night. He verbalized appreciation for the call.

## 2019-06-11 ENCOUNTER — Other Ambulatory Visit: Payer: Self-pay | Admitting: *Deleted

## 2019-06-11 MED ORDER — DONEPEZIL HCL 10 MG PO TABS: 10.0000 mg | ORAL_TABLET | Freq: Every day | ORAL | 5 refills | Status: DC

## 2019-06-11 NOTE — Telephone Encounter (Signed)
I spoke with Abe People (on DPR) and advised the new DPR had been scanned into the chart. He denied pt having any side effects to the  I also spoke with Dr. Jaynee Eagles and she advised pt can go ahead and increase the Donepezil to 10 mg daily at bedtime and we will send in new script. He understands to have the pt take two 5 mg tablets until next refill which will be a 10 mg tablet. He was advised to call back if he has any concerns or pt develops s/e. He stated the daughter purchased some vitamins for the pt. He is not home to provide details but will call back later. He verbalized appreciation for the call.

## 2019-06-11 NOTE — Telephone Encounter (Signed)
Updated DPR is in the chart.

## 2019-06-11 NOTE — Telephone Encounter (Signed)
Order for Donepezil 10 mg QHS #30 refills 5 placed per v.o. Dr. Jaynee Eagles.

## 2019-09-23 ENCOUNTER — Telehealth: Payer: Self-pay

## 2019-09-23 NOTE — Telephone Encounter (Signed)
Patient daughter called stating that the patient is more agitated/mood swings and believe it could be linked to the donepezil and would like to FU with someone.  Please follow up

## 2019-09-24 NOTE — Telephone Encounter (Signed)
I returned Shea's (on [place]) call. She stated the patient seems to be doing well on aricept, no s/e, however she notes patient is basically either angry or in tears. Each morning she calls [name] and is angry at pt's husband. [name] says it's the same story every morning and also reports the pt's husband takes good care of her. Pt is more alert in the mornings but as the day goes on she gets worse. I discussed with [name] that behavior changes can certainly be seen in dementia however if her mother has an acute confusion or change in behavior to call PCP to r/o things like UTI or other changes in lab values. I advised if she has any stroke-like symptoms including but not limited to weakness, slurred speech, severe headache, balance issue, etc to call 911. She asked about medication and I let her know there may be medications needed for behavior and this can be discussed with provider at office visit. She noted patient has h/o antidepressant use. I offered a sooner appointment if she would like. She prefers to discuss with pt's husband, keep a record of the mood swings and keep appt as is for now. We discussed she could try to divert patients attention when she gets fixated on something. [name] will call for sooner appt if needed.

## 2019-09-25 NOTE — Telephone Encounter (Signed)
Called Shea back and LVM on mobile (ok per DPR) advising of message from Amy NP. I asked for a call back if she would like to have a sooner appt or she can see PCP to consider the Escitalopram or Sertraline. Otherwise will plan to see her at the next scheduled appt. Left office number in message.

## 2019-09-25 NOTE — Telephone Encounter (Signed)
Please let her know that anxiety and depression is very common with dementia patients. I would suggest that they talk to PCP regarding possibility of starting a medication to treat anxiety/depression. Escitalopram or sertraline are both good options to consider. I am happy to see her back in our office too!

## 2019-10-15 ENCOUNTER — Other Ambulatory Visit: Payer: Self-pay

## 2019-10-15 ENCOUNTER — Ambulatory Visit
Admission: RE | Admit: 2019-10-15 | Discharge: 2019-10-15 | Disposition: A | Discharge status: Home / Self Care | Payer: PPO | Source: Ambulatory Visit | Attending: Family Medicine | Admitting: Family Medicine

## 2019-10-15 DIAGNOSIS — Z1231 Encounter for screening mammogram for malignant neoplasm of breast: Secondary | ICD-10-CM

## 2019-10-21 ENCOUNTER — Ambulatory Visit
Admission: RE | Admit: 2019-10-21 | Discharge: 2019-10-21 | Disposition: A | Discharge status: Home / Self Care | Payer: PPO | Source: Ambulatory Visit | Attending: Family Medicine | Admitting: Family Medicine

## 2019-10-21 ENCOUNTER — Other Ambulatory Visit: Payer: Self-pay

## 2019-10-21 DIAGNOSIS — Z1231 Encounter for screening mammogram for malignant neoplasm of breast: Secondary | ICD-10-CM | POA: Diagnosis not present

## 2019-11-05 NOTE — Progress Notes (Addendum)
Wendy Watts: Wendy Watts DOB: 1946-03-23  REASON FOR VISIT: follow up HISTORY FROM: Wendy Watts  Chief Complaint  Wendy Watts presents with  . Follow-up    Rm1. with daughter. 80mo f/u. Doing well     HISTORY OF PRESENT ILLNESS: Today 11/06/19 Wendy Watts is a 74 y.o. female here today for follow up for dementia. Wendy Watts has tolerated Aricept 10mg  daily. Wendy Watts feels that it has helped. Her daughter is with her today and is not certain. Wendy Watts requires assistance with ADL's.  Her daughter will lay out her close but Wendy Watts is able to put garments on herself.  Her husband assist her with getting in and out of the bathtub.  Wendy Watts is able to wash herself.  Her husband helps with medications. Husband and daughter help with finances. Wendy Watts does not drive. Wendy Watts did have a fall in October.. Wendy Watts stumbled up set of stairs. Wendy Watts did hit her head but reports only bruising. Wendy Watts has been followed closely by PCP. Wendy Watts was recently started on Buspar. Wendy Watts does feel that it has helped some.  Her son is currently awaiting trial for theft.  Her daughter mentions today that Wendy Watts may have history of bipolar disorder.  There is been a strained relationship between mother and daughter for years.  HISTORY: (copied from [name] note on 05/08/2019)  HPI:  Wendy Watts is a 74 y.o. female here as requested by [name], MD for cognitive changes so significant they are disrupting her financial affairs.  Past medical history migraine, hyperlipidemia, dementia most consistent with Alzheimer's type, anxiety, high risk medication use, osteopenia, depression anxety. Wendy Watts Wendy Watts here today with daughter. Daughter knew memory problems but was not that aware until the car was repossessed after her son took all her money and then discovered many bills not paid or paid incorrectly, check not dated, house bill not paid in eight months, bounced checks due to money missing. MMSE 15/30. Wendy Watts lives in [place], Wendy Watts lives with her husband of 30  years, have lived in the house since the kids were little. No falls at home. Several years of memory/cognotive changes, slowly progressive. Wendy Watts says Wendy Watts can cook at home, it takes her longer to cook these days, Wendy Watts fixed chili beans the other night, but daughter says Wendy Watts doesn't cook a lot and Wendy Watts goes to daughter's house to cook. They are trying to get their house out of foreclosure,  Arrested 2 weeks ago for stealing, house in foreclosure, wife was supposed to be paying the bills and had debits setup, wasn't aware, son hid statements and changed phone numbers. When the daughter found out, son was arrested 2 weeks ago, manipulated mother. Son is back living in the house and they refuse to evict him. No memory problems with husband. Wendy Watts does not think Wendy Watts has cognitive issues. Daughter thought it was just mother getting older, Wendy Watts sees them once or twice a week so we didn't notice, Wendy Watts was doing things like forgetting where Wendy Watts was sitting, would forget things, short-term memory progressively getting worse, Wendy Watts doesn't drive, husband drives. Repetitive conversations, telling same stories int he same day. No known family history.   Reviewed notes, labs and imaging from outside physicians, which showed:  I reviewed Dr. Alan Ripper notes.  Daughters concerned for dementia.  Progressive symptoms, disrupting her life, causing financial difficulties.  Daughter is very concerned about dementia, given history [name] believes this is most concerning for Alzheimer's type.  Last labs were collected Jan 04, 2018 which includes normal CBC, TSH 0.92.   REVIEW OF SYSTEMS: Out of a complete 14 system review of symptoms, the Wendy Watts complains only of the following symptoms, memory loss, anxiety and all other reviewed systems are negative.    ALLERGIES: Allergies  Allergen Reactions  . Allegra [Fexofenadine Hcl] Other (See Comments)    "takes me to another world"  . Ativan [Lorazepam] Other (See  Comments)    Hallucinations  . Azithromycin Nausea And Vomiting  . Codeine Hives  . Doxycycline Hyclate Nausea Only  . Penicillins Hives    Has Wendy Watts had a PCN reaction causing immediate rash, facial/tongue/throat swelling, SOB or lightheadedness with hypotension: no Has Wendy Watts had a PCN reaction causing severe rash involving mucus membranes or skin necrosis: no Has Wendy Watts had a PCN reaction that required hospitalization: no Has Wendy Watts had a PCN reaction occurring within the last 10 years: no If all of the above answers are "NO", then may proceed with Cephalosporin use.   . Trazodone And Nefazodone Nausea Only  . Zyrtec [Cetirizine] Other (See Comments)    "takes me to another world"    HOME MEDICATIONS: Outpatient Medications Prior to Visit  Medication Sig Dispense Refill  . busPIRone (BUSPAR) 5 MG tablet Take 5 mg by mouth 2 (two) times daily.    Marland Kitchen donepezil (ARICEPT) 10 MG tablet Take 1 tablet (10 mg total) by mouth at bedtime. 30 tablet 5  . fluticasone (FLONASE) 50 MCG/ACT nasal spray Place 2 sprays into both nostrils daily as needed for allergies or rhinitis.    . Loratadine (CLARITIN PO) Take by mouth.    . lovastatin (MEVACOR) 40 MG tablet Take 80 mg by mouth every evening.     . Multiple Vitamin (MULTIVITAMIN PO) Take by mouth.    . naproxen (NAPROSYN) 500 MG tablet TAKE 1 TABLET(500 MG) BY MOUTH TWICE DAILY WITH A MEAL (Wendy Watts not taking: Reported on 05/08/2019) 180 tablet 0  . ondansetron (ZOFRAN-ODT) 4 MG disintegrating tablet DIS 1 T ON THE TONGUE Q 8 H PRF NAUSEA OR VOM  1  . ALPRAZolam (XANAX) 0.5 MG tablet Take 0.5 mg by mouth at bedtime as needed for sleep.    Marland Kitchen buPROPion (WELLBUTRIN XL) 150 MG 24 hr tablet TK 1 T PO ONCE A DAY IN THE MORNING  4   No facility-administered medications prior to visit.    PAST MEDICAL HISTORY: Past Medical History:  Diagnosis Date  . Anxiety   . Dementia ([place])   . High cholesterol   . Vertigo     PAST SURGICAL  HISTORY: Past Surgical History:  Procedure Laterality Date  . HYSTERECTOMY    . TOTAL KNEE ARTHROPLASTY Right     FAMILY HISTORY: Family History  Problem Relation Age of Onset  . Dementia Neg Hx   . Alzheimer's disease Neg Hx     SOCIAL HISTORY: Social History   Socioeconomic History  . Marital status: Married    Spouse name: Not on file  . Number of children: 2  . Years of education: Not on file  . Highest education level: High school graduate  Occupational History  . Not on file  Tobacco Use  . Smoking status: Never Smoker  . Smokeless tobacco: Never Used  Substance and Sexual Activity  . Alcohol use: Never  . Drug use: Never  . Sexual activity: Not on file  Other Topics Concern  . Not on file  Social History Narrative   LIves at home with husband and  son   Social Determinants of Radio broadcast assistant Strain:   . Difficulty of Paying Living Expenses:   Food Insecurity:   . Worried About Charity fundraiser in the Last Year:   . Arboriculturist in the Last Year:   Transportation Needs:   . Film/video editor (Medical):   Marland Kitchen Lack of Transportation (Non-Medical):   Physical Activity:   . Days of Exercise per Week:   . Minutes of Exercise per Session:   Stress:   . Feeling of Stress :   Social Connections:   . Frequency of Communication with Friends and Family:   . Frequency of Social Gatherings with Friends and Family:   . Attends Religious Services:   . Active Member of Clubs or Organizations:   . Attends Archivist Meetings:   Marland Kitchen Marital Status:   Intimate Partner Violence:   . Fear of Current or Ex-Partner:   . Emotionally Abused:   Marland Kitchen Physically Abused:   . Sexually Abused:       PHYSICAL EXAM  Vitals:   11/06/19 1014  BP: 136/78  Pulse: 68  Temp: (!) 96.9 F (36.1 C)  SpO2: 98%  Weight: 150 lb 12.8 oz (68.4 kg)   Body mass index is 25.88 kg/m.  Generalized: Well developed, in no acute distress  Cardiology: normal  rate and rhythm, no murmur noted Neurological examination  Mentation: Alert, emotional throughout visit, easily distracted. Thoughts are random around during conversation.  Wendy Watts is oriented to time, place, some history taking. Wendy Watts is easily distracted. Follows all commands speech and language fluent Cranial nerve II-XII: Pupils were equal round reactive to light. Extraocular movements were full, visual field were full  Motor: The motor testing reveals 5 over 5 strength of all 4 extremities. Good symmetric motor tone is noted throughout.  Gait and station: Gait is normal.    DIAGNOSTIC DATA (LABS, IMAGING, TESTING) - I reviewed Wendy Watts records, labs, notes, testing and imaging myself where available.  MMSE - Mini Mental State Exam 11/06/2019 05/08/2019  Orientation to time 3 1  Orientation to Place 4 3  Registration 3 3  Attention/ Calculation 0 0  Recall 1 2  Language- name 2 objects 2 2  Language- repeat 1 0  Language- follow 3 step command 3 3  Language- read & follow direction 1 1  Write a sentence 1 0  Copy design 0 0  Total score 19 15     Lab Results  Component Value Date   WBC 6.1 05/08/2019   HGB 13.7 05/08/2019   HCT 41.4 05/08/2019   MCV 92 05/08/2019   PLT 357 05/08/2019      Component Value Date/Time   NA 142 05/08/2019 1156   K 4.5 05/08/2019 1156   CL 106 05/08/2019 1156   CO2 24 05/08/2019 1156   GLUCOSE 97 05/08/2019 1156   GLUCOSE 112 (H) 09/21/2016 0424   BUN 11 05/08/2019 1156   CREATININE 0.91 05/08/2019 1156   CALCIUM 10.3 05/08/2019 1156   PROT 6.8 05/08/2019 1156   ALBUMIN 4.1 05/08/2019 1156   AST 13 05/08/2019 1156   ALT 9 05/08/2019 1156   ALKPHOS 91 05/08/2019 1156   BILITOT 0.6 05/08/2019 1156   GFRNONAA 63 05/08/2019 1156   GFRAA 72 05/08/2019 1156   No results found for: CHOL, HDL, LDLCALC, LDLDIRECT, TRIG, CHOLHDL No results found for: HGBA1C Lab Results  Component Value Date   VITAMINB12 287 05/08/2019   Lab  Results   Component Value Date   TSH 0.920 05/08/2019    ASSESSMENT AND PLAN 74 y.o. year old female  has a past medical history of Anxiety, Dementia ([place]), High cholesterol, and Vertigo. here with     ICD-10-CM   1. Dementia without behavioral disturbance, unspecified dementia type ([place])  F03.90     Amelie has tolerated Aricept 10 mg daily without obvious adverse effects.  It is uncertain whether there have been any significant changes in memory since last being seen in September.  We will add Namenda 5 mg twice daily.  Her daughter was instructed to start medication once daily for about a week and then increase to twice daily dosing.  Wendy Watts will continue close follow-up with [name], PCP, for anxiety management.  I have discussed with her daughter the option of considering a geriatric psychiatrist referral in the future if needed.  Fall precautions reviewed.  Wendy Watts was advised to stay well-hydrated.  Regular exercise encouraged.  Wendy Watts will follow-up with me in 3 months, sooner if needed.  Wendy Watts and her daughter verbalize understanding and agreement with this plan.   No orders of the defined types were placed in this encounter.    Meds ordered this encounter  Medications  . memantine (NAMENDA) 5 MG tablet    Sig: Take 1 tablet (5 mg total) by mouth 2 (two) times daily.    Dispense:  180 tablet    Refill:  3    Order Specific Question:   Supervising Provider    Answer:   Melvenia Beam [id number]      I spent 30 minutes with the Wendy Watts. 50% of this time was spent counseling and educating Wendy Watts on plan of care and medications.    Debbora Presto, FNP-C 11/06/2019, 10:30 AM [facility] [address], [place], [place] [address] [phone]  Made any corrections needed, and agree with history, physical, neuro exam,assessment and plan as stated.     Sarina Ill, MD [facility]

## 2019-11-05 NOTE — Patient Instructions (Addendum)
Continue Aricept 10mg  daily   We will add Namenda (memantine) 5mg  twice daily. Start with 5mg  daily for 1 week then increase to twice daily dosing.   Stay active. Stay well hydrated. Follow up closely with PCP   Follow up in 3 months    Memantine Tablets What is this medicine? MEMANTINE (MEM an teen) is used to treat dementia caused by Alzheimer's disease. This medicine may be used for other purposes; ask your health care provider or pharmacist if you have questions. COMMON BRAND NAME(S): Namenda What should I tell my health care provider before I take this medicine? They need to know if you have any of these conditions:  difficulty passing urine  kidney disease  liver disease  seizures  an unusual or allergic reaction to memantine, other medicines, foods, dyes, or preservatives  pregnant or trying to get pregnant  breast-feeding How should I use this medicine? Take this medicine by mouth with a glass of water. Follow the directions on the prescription label. You may take this medicine with or without food. Take your doses at regular intervals. Do not take your medicine more often than directed. Continue to take your medicine even if you feel better. Do not stop taking except on the advice of your doctor or health care professional. Talk to your pediatrician regarding the use of this medicine in children. Special care may be needed. Overdosage: If you think you have taken too much of this medicine contact a poison control center or emergency room at once. NOTE: This medicine is only for you. Do not share this medicine with others. What if I miss a dose? If you miss a dose, take it as soon as you can. If it is almost time for your next dose, take only that dose. Do not take double or extra doses. If you do not take your medicine for several days, contact your health care provider. Your dose may need to be changed. What may interact with this  medicine?  acetazolamide  amantadine  cimetidine  dextromethorphan  dofetilide  hydrochlorothiazide  ketamine  metformin  methazolamide  quinidine  ranitidine  sodium bicarbonate  triamterene This list may not describe all possible interactions. Give your health care provider a list of all the medicines, herbs, non-prescription drugs, or dietary supplements you use. Also tell them if you smoke, drink alcohol, or use illegal drugs. Some items may interact with your medicine. What should I watch for while using this medicine? Visit your doctor or health care professional for regular checks on your progress. Check with your doctor or health care professional if there is no improvement in your symptoms or if they get worse. You may get drowsy or dizzy. Do not drive, use machinery, or do anything that needs mental alertness until you know how this drug affects you. Do not stand or sit up quickly, especially if you are an older patient. This reduces the risk of dizzy or fainting spells. Alcohol can make you more drowsy and dizzy. Avoid alcoholic drinks. What side effects may I notice from receiving this medicine? Side effects that you should report to your doctor or health care professional as soon as possible:  allergic reactions like skin rash, itching or hives, swelling of the face, lips, or tongue  agitation or a feeling of restlessness  depressed mood  dizziness  hallucinations  redness, blistering, peeling or loosening of the skin, including inside the mouth  seizures  vomiting Side effects that usually do not require medical  attention (report to your doctor or health care professional if they continue or are bothersome):  constipation  diarrhea  headache  nausea  trouble sleeping This list may not describe all possible side effects. Call your doctor for medical advice about side effects. You may report side effects to FDA at 1-800-FDA-1088. Where should I  keep my medicine? Keep out of the reach of children. Store at room temperature between 15 degrees and 30 degrees C (59 degrees and 86 degrees F). Throw away any unused medicine after the expiration date. NOTE: This sheet is a summary. It may not cover all possible information. If you have questions about this medicine, talk to your doctor, pharmacist, or health care provider.  2020 Elsevier/Gold Standard (2013-05-20 14:10:42)   Memory Compensation Strategies  1. Use "WARM" strategy.  W= write it down  A= associate it  R= repeat it  M= make a mental note  2.   You can keep a Social worker.  Use a 3-ring notebook with sections for the following: calendar, important names and phone numbers,  medications, doctors' names/phone numbers, lists/reminders, and a section to journal what you did  each day.   3.    Use a calendar to write appointments down.  4.    Write yourself a schedule for the day.  This can be placed on the calendar or in a separate section of the Memory Notebook.  Keeping a  regular schedule can help memory.  5.    Use medication organizer with sections for each day or morning/evening pills.  You may need help loading it  6.    Keep a basket, or pegboard by the door.  Place items that you need to take out with you in the basket or on the pegboard.  You may also want to  include a message board for reminders.  7.    Use sticky notes.  Place sticky notes with reminders in a place where the task is performed.  For example: " turn off the  stove" placed by the stove, "lock the door" placed on the door at eye level, " take your medications" on  the bathroom mirror or by the place where you normally take your medications.  8.    Use alarms/timers.  Use while cooking to remind yourself to check on food or as a reminder to take your medicine, or as a  reminder to make a call, or as a reminder to perform another task, etc.   Dementia Caregiver Guide Dementia is a term used  to describe a number of symptoms that affect memory and thinking. The most common symptoms include:  Memory loss.  Trouble with language and communication.  Trouble concentrating.  Poor judgment.  Problems with reasoning.  Child-like behavior and language.  Extreme anxiety.  Angry outbursts.  Wandering from home or public places. Dementia usually gets worse slowly over time. In the early stages, people with dementia can stay independent and safe with some help. In later stages, they need help with daily tasks such as dressing, grooming, and using the bathroom. How to help the person with dementia cope Dementia can be frightening and confusing. Here are some tips to help the person with dementia cope with changes caused by the disease. General tips  Keep the person on track with his or her routine.  Try to identify areas where the person may need help.  Be supportive, patient, calm, and encouraging.  Gently remind the person that adjusting to changes  takes time.  Help with the tasks that the person has asked for help with.  Keep the person involved in daily tasks and decisions as much as possible.  Encourage conversation, but try not to get frustrated or harried if the person struggles to find words or does not seem to appreciate your help. Communication tips  When the person is talking or seems frustrated, make eye contact and hold the person's hand.  Ask specific questions that need yes or no answers.  Use simple words, short sentences, and a calm voice. Only give one direction at a time.  When offering choices, limit them to just 1 or 2.  Avoid correcting the person in a negative way.  If the person is struggling to find the right words, gently try to help him or her. How to recognize symptoms of stress Symptoms of stress in caregivers include:  Feeling frustrated or angry with the person with dementia.  Denying that the person has dementia or that his or her  symptoms will not improve.  Feeling hopeless and unappreciated.  Difficulty sleeping.  Difficulty concentrating.  Feeling anxious, irritable, or depressed.  Developing stress-related health problems.  Feeling like you have too little time for your own life. Follow these instructions at home:   Make sure that you and the person you are caring for: ? Get regular sleep. ? Exercise regularly. ? Eat regular, nutritious meals. ? Drink enough fluid to keep your urine clear or pale yellow. ? Take over-the-counter and prescription medicines only as told by your health care providers. ? Attend all scheduled health care appointments.  Join a support group with others who are caregivers.  Ask about respite care resources so that you can have a regular break from the stress of caregiving.  Look for signs of stress in yourself and in the person you are caring for. If you notice signs of stress, take steps to manage it.  Consider any safety risks and take steps to avoid them.  Organize medications in a pill box for each day of the week.  Create a plan to handle any legal or financial matters. Get legal or financial advice if needed.  Keep a calendar in a central location to remind the person of appointments or other activities. Tips for reducing the risk of injury  Keep floors clear of clutter. Remove rugs, magazine racks, and floor lamps.  Keep hallways well lit, especially at night.  Put a handrail and nonslip mat in the bathtub or shower.  Put childproof locks on cabinets that contain dangerous items, such as medicines, alcohol, guns, toxic cleaning items, sharp tools or utensils, matches, and lighters.  Put the locks in places where the person cannot see or reach them easily. This will help ensure that the person does not wander out of the house and get lost.  Be prepared for emergencies. Keep a list of emergency phone numbers and addresses in a convenient area.  Remove car  keys and lock garage doors so that the person does not try to get in the car and drive.  Have the person wear a bracelet that tracks locations and identifies the person as having memory problems. This should be worn at all times for safety. Where to find support: Many individuals and organizations offer support. These include:  Support groups for people with dementia and for caregivers.  Counselors or therapists.  Home health care services.  Adult day care centers. Where to find more information Alzheimer's Association: [employer] Contact  a health care provider if:  The person's health is rapidly getting worse.  You are no longer able to care for the person.  Caring for the person is affecting your physical and emotional health.  The person threatens himself or herself, you, or anyone else. Summary  Dementia is a term used to describe a number of symptoms that affect memory and thinking.  Dementia usually gets worse slowly over time.  Take steps to reduce the person's risk of injury, and to plan for future care.  Caregivers need support, relief from caregiving, and time for their own lives. This information is not intended to replace advice given to you by your health care provider. Make sure you discuss any questions you have with your health care provider. Document Revised: 07/14/2017 Document Reviewed: 07/05/2016 Elsevier Patient Education  2020 [employer].

## 2019-11-06 ENCOUNTER — Encounter: Payer: Self-pay | Admitting: Family Medicine

## 2019-11-06 ENCOUNTER — Other Ambulatory Visit: Payer: Self-pay

## 2019-11-06 ENCOUNTER — Ambulatory Visit: Payer: PPO | Admitting: Family Medicine

## 2019-11-06 VITALS — BP 136/78 | HR 68 | Temp 96.9°F | Wt 150.8 lb

## 2019-11-06 DIAGNOSIS — F0390 Unspecified dementia without behavioral disturbance: Secondary | ICD-10-CM | POA: Diagnosis not present

## 2019-11-06 DIAGNOSIS — E559 Vitamin D deficiency, unspecified: Secondary | ICD-10-CM | POA: Diagnosis not present

## 2019-11-06 DIAGNOSIS — R451 Restlessness and agitation: Secondary | ICD-10-CM | POA: Diagnosis not present

## 2019-11-06 DIAGNOSIS — R410 Disorientation, unspecified: Secondary | ICD-10-CM | POA: Diagnosis not present

## 2019-11-06 DIAGNOSIS — J309 Allergic rhinitis, unspecified: Secondary | ICD-10-CM | POA: Diagnosis not present

## 2019-11-06 DIAGNOSIS — F411 Generalized anxiety disorder: Secondary | ICD-10-CM | POA: Diagnosis not present

## 2019-11-06 MED ORDER — MEMANTINE HCL 5 MG PO TABS: 5.0000 mg | ORAL_TABLET | Freq: Two times a day (BID) | ORAL | 3 refills | Status: DC

## 2019-11-08 ENCOUNTER — Ambulatory Visit: Payer: PPO | Attending: Internal Medicine

## 2019-11-08 DIAGNOSIS — Z23 Encounter for immunization: Secondary | ICD-10-CM

## 2019-11-08 NOTE — Progress Notes (Signed)
   Covid-19 Vaccination Clinic  Name:  Wendy Watts    MRN: [id number] DOB: 1946-07-21  11/08/2019  [name] was observed post Covid-19 immunization for 30 minutes based on pre-vaccination screening without incident. She was provided with Vaccine Information Sheet and instruction to access the V-Safe system.   [name] was instructed to call 911 with any severe reactions post vaccine: Marland Kitchen Difficulty breathing  . Swelling of face and throat  . A fast heartbeat  . A bad rash all over body  . Dizziness and weakness   Immunizations Administered    Name Date Dose VIS Date Route   Pfizer COVID-19 Vaccine 11/08/2019  2:10 PM 0.3 mL 07/26/2019 Intramuscular   Manufacturer: [place]   Lot: [id number]   [place]: [id number]

## 2019-12-03 ENCOUNTER — Ambulatory Visit: Payer: PPO | Attending: Internal Medicine

## 2019-12-03 DIAGNOSIS — Z23 Encounter for immunization: Secondary | ICD-10-CM

## 2019-12-03 NOTE — Progress Notes (Signed)
   Covid-19 Vaccination Clinic  Name:  Wendy Watts    MRN: [id number] DOB: [birth year]  12/03/2019  [name] was observed post Covid-19 immunization for 15 minutes without incident. She was provided with Vaccine Information Sheet and instruction to access the V-Safe system.   [name] was instructed to call 911 with any severe reactions post vaccine: Marland Kitchen Difficulty breathing  . Swelling of face and throat  . A fast heartbeat  . A bad rash all over body  . Dizziness and weakness   Immunizations Administered    Name Date Dose VIS Date Route   Pfizer COVID-19 Vaccine 12/03/2019  1:57 PM 0.3 mL 10/09/2018 Intramuscular   Manufacturer: [place]   Lot: [id number]   [place]: [id number]

## 2019-12-05 ENCOUNTER — Other Ambulatory Visit: Payer: Self-pay | Admitting: Neurology

## 2020-01-06 ENCOUNTER — Other Ambulatory Visit: Payer: Self-pay | Admitting: Neurology

## 2020-02-06 ENCOUNTER — Ambulatory Visit: Payer: PPO | Admitting: Family Medicine

## 2020-02-06 ENCOUNTER — Encounter: Payer: Self-pay | Admitting: Family Medicine

## 2020-02-06 ENCOUNTER — Other Ambulatory Visit: Payer: Self-pay

## 2020-02-06 VITALS — BP 127/71 | HR 73 | Wt 151.0 lb

## 2020-02-06 DIAGNOSIS — G309 Alzheimer's disease, unspecified: Secondary | ICD-10-CM | POA: Diagnosis not present

## 2020-02-06 MED ORDER — DONEPEZIL HCL 10 MG PO TABS: ORAL_TABLET | ORAL | 3 refills | Status: DC

## 2020-02-06 MED ORDER — MEMANTINE HCL 10 MG PO TABS: 10.0000 mg | ORAL_TABLET | Freq: Two times a day (BID) | ORAL | 3 refills | Status: DC

## 2020-02-06 NOTE — Patient Instructions (Signed)
We will increase Namenda to 10mg  twice daily. Continue Aricept 10mg  daily.   Stay active. Eat health and drink plenty of water.   Continue close follow up with Dr Harrington Challenger as well.   We will see you back in 6 months   Memory Compensation Strategies  1. Use "WARM" strategy.  W= write it down  A= associate it  R= repeat it  M= make a mental note  2.   You can keep a Social worker.  Use a 3-ring notebook with sections for the following: calendar, important names and phone numbers,  medications, doctors' names/phone numbers, lists/reminders, and a section to journal what you did  each day.   3.    Use a calendar to write appointments down.  4.    Write yourself a schedule for the day.  This can be placed on the calendar or in a separate section of the Memory Notebook.  Keeping a  regular schedule can help memory.  5.    Use medication organizer with sections for each day or morning/evening pills.  You may need help loading it  6.    Keep a basket, or pegboard by the door.  Place items that you need to take out with you in the basket or on the pegboard.  You may also want to  include a message board for reminders.  7.    Use sticky notes.  Place sticky notes with reminders in a place where the task is performed.  For example: " turn off the  stove" placed by the stove, "lock the door" placed on the door at eye level, " take your medications" on  the bathroom mirror or by the place where you normally take your medications.  8.    Use alarms/timers.  Use while cooking to remind yourself to check on food or as a reminder to take your medicine, or as a  reminder to make a call, or as a reminder to perform another task, etc.   Dementia Caregiver Guide Dementia is a term used to describe a number of symptoms that affect memory and thinking. The most common symptoms include:  Memory loss.  Trouble with language and communication.  Trouble concentrating.  Poor judgment.  Problems  with reasoning.  Child-like behavior and language.  Extreme anxiety.  Angry outbursts.  Wandering from home or public places. Dementia usually gets worse slowly over time. In the early stages, people with dementia can stay independent and safe with some help. In later stages, they need help with daily tasks such as dressing, grooming, and using the bathroom. How to help the person with dementia cope Dementia can be frightening and confusing. Here are some tips to help the person with dementia cope with changes caused by the disease. General tips  Keep the person on track with his or her routine.  Try to identify areas where the person may need help.  Be supportive, patient, calm, and encouraging.  Gently remind the person that adjusting to changes takes time.  Help with the tasks that the person has asked for help with.  Keep the person involved in daily tasks and decisions as much as possible.  Encourage conversation, but try not to get frustrated or harried if the person struggles to find words or does not seem to appreciate your help. Communication tips  When the person is talking or seems frustrated, make eye contact and hold the person's hand.  Ask specific questions that need yes or no  answers.  Use simple words, short sentences, and a calm voice. Only give one direction at a time.  When offering choices, limit them to just 1 or 2.  Avoid correcting the person in a negative way.  If the person is struggling to find the right words, gently try to help him or her. How to recognize symptoms of stress Symptoms of stress in caregivers include:  Feeling frustrated or angry with the person with dementia.  Denying that the person has dementia or that his or her symptoms will not improve.  Feeling hopeless and unappreciated.  Difficulty sleeping.  Difficulty concentrating.  Feeling anxious, irritable, or depressed.  Developing stress-related health  problems.  Feeling like you have too little time for your own life. Follow these instructions at home:   Make sure that you and the person you are caring for: ? Get regular sleep. ? Exercise regularly. ? Eat regular, nutritious meals. ? Drink enough fluid to keep your urine clear or pale yellow. ? Take over-the-counter and prescription medicines only as told by your health care providers. ? Attend all scheduled health care appointments.  Join a support group with others who are caregivers.  Ask about respite care resources so that you can have a regular break from the stress of caregiving.  Look for signs of stress in yourself and in the person you are caring for. If you notice signs of stress, take steps to manage it.  Consider any safety risks and take steps to avoid them.  Organize medications in a pill box for each day of the week.  Create a plan to handle any legal or financial matters. Get legal or financial advice if needed.  Keep a calendar in a central location to remind the person of appointments or other activities. Tips for reducing the risk of injury  Keep floors clear of clutter. Remove rugs, magazine racks, and floor lamps.  Keep hallways well lit, especially at night.  Put a handrail and nonslip mat in the bathtub or shower.  Put childproof locks on cabinets that contain dangerous items, such as medicines, alcohol, guns, toxic cleaning items, sharp tools or utensils, matches, and lighters.  Put the locks in places where the person cannot see or reach them easily. This will help ensure that the person does not wander out of the house and get lost.  Be prepared for emergencies. Keep a list of emergency phone numbers and addresses in a convenient area.  Remove car keys and lock garage doors so that the person does not try to get in the car and drive.  Have the person wear a bracelet that tracks locations and identifies the person as having memory problems. This  should be worn at all times for safety. Where to find support: Many individuals and organizations offer support. These include:  Support groups for people with dementia and for caregivers.  Counselors or therapists.  Home health care services.  Adult day care centers. Where to find more information Alzheimer's Association: [employer] Contact a health care provider if:  The person's health is rapidly getting worse.  You are no longer able to care for the person.  Caring for the person is affecting your physical and emotional health.  The person threatens himself or herself, you, or anyone else. Summary  Dementia is a term used to describe a number of symptoms that affect memory and thinking.  Dementia usually gets worse slowly over time.  Take steps to reduce the person's risk of injury,  and to plan for future care.  Caregivers need support, relief from caregiving, and time for their own lives. This information is not intended to replace advice given to you by your health care provider. Make sure you discuss any questions you have with your health care provider. Document Revised: 07/14/2017 Document Reviewed: 07/05/2016 Elsevier Patient Education  2020 [employer].

## 2020-02-06 NOTE — Progress Notes (Addendum)
PATIENT: Wendy Watts DOB: [birth year]  REASON FOR VISIT: follow up HISTORY FROM: patient  Chief Complaint  Patient presents with  . Follow-up    Rm 1 here for on memory.     HISTORY OF PRESENT ILLNESS: Today 02/06/20 Wendy Watts is a 74 y.o. female here today for follow up. She has continued Aricept 10mg  daily. Namenda 5mg  BID was added in 10/2019. She has tolerated it well. Memory seems stable. Her husband is with her today and aids in history. She continues to perform ADL's independently. She may need some assistance with getting in and out of the shower. Her daughter lays out her clothes. She does not drive. She reports being active. She likes to dance. No falls. Her husband reports that they are active and get out of the house every day.    HISTORY: (copied from my note on 11/06/2019)  Wendy Watts is a 74 y.o. female here today for follow up for dementia. She has tolerated Aricept 10mg  daily. Wendy Watts feels that it has helped. Her daughter is with her today and is not certain. She requires assistance with ADL's.  Her daughter will lay out her close but she is able to put garments on herself.  Her husband assist her with getting in and out of the bathtub.  Wendy Watts is able to wash herself.  Her husband helps with medications. Husband and daughter help with finances. She does not drive. She did have a fall in October.. She stumbled up set of stairs. She did hit her head but reports only bruising. She has been followed closely by PCP. She was recently started on Buspar. She does feel that it has helped some.  Her son is currently awaiting trial for theft.  Her daughter mentions today that patient may have history of bipolar disorder.  There is been a strained relationship between mother and daughter for years.  HISTORY: (copied from [name] note on 05/08/2019)  FFM:Wendy Watts a 74 y.o.femalehere as requested by Wendy Watts, MDfor cognitive changes so significant they  are disrupting her financial affairs. Past medical history migraine, hyperlipidemia, dementia most consistent with Alzheimer's type, anxiety, high risk medication use, osteopenia, depression anxety.Nice patient here today with daughter. Daughter knew memory problems but was not that aware until the car was repossessed after her son took all her money and then discovered many bills not paid or paid incorrectly, check not dated, house bill not paid in eight months, bounced checks due to money missing. MMSE 15/30. She lives in [place], She lives with her husband of 81 years, have lived in the house since the kids were little. No falls at home. Several years of memory/cognotive changes, slowly progressive. Patient says she can cook at home, it takes her longer to cook these days, she fixed chili beans the other night, but daughter says patient doesn't cook a lot and she goes to daughter's house to cook. They are trying to get their house out of foreclosure, Arrested 2 weeks ago for stealing, house in foreclosure, wife was supposed to be paying the bills and had debits setup, wasn't aware, son hid statements and changed phone numbers. When the daughter found out, son was arrested 2 weeks ago, manipulated mother. Son is back living in the house and they refuse to evict him. No memory problems with husband. Patient does not think she has cognitive issues. Daughter thought it was just mother getting older, she sees them once or twice  a week so we didn't notice, she was doing things like forgetting where she was sitting, would forget things, short-term memory progressively getting worse, she doesn't drive, husband drives. Repetitive conversations, telling same stories int he same day. No known family history.   Reviewed notes, labs and imaging from outside physicians, which showed:  I reviewed Dr. Alan Ripper notes. Daughters concerned for dementia. Progressive symptoms, disrupting her life, causing financial  difficulties. Daughter is very concerned about dementia, given history [name] believes this is most concerning for Alzheimer's type.  Last labs were collected Jan 04, 2018 which includes normal CBC, TSH 0.92.   REVIEW OF SYSTEMS: Out of a complete 14 system review of symptoms, the patient complains only of the following symptoms, memory loss, right knee pain and all other reviewed systems are negative.  ALLERGIES: Allergies  Allergen Reactions  . Allegra [Fexofenadine Hcl] Other (See Comments)    "takes me to another world"  . Ativan [Lorazepam] Other (See Comments)    Hallucinations  . Azithromycin Nausea And Vomiting  . Codeine Hives  . Doxycycline Hyclate Nausea Only  . Penicillins Hives    Has patient had a PCN reaction causing immediate rash, facial/tongue/throat swelling, SOB or lightheadedness with hypotension: no Has patient had a PCN reaction causing severe rash involving mucus membranes or skin necrosis: no Has patient had a PCN reaction that required hospitalization: no Has patient had a PCN reaction occurring within the last 10 years: no If all of the above answers are "NO", then may proceed with Cephalosporin use.   . Trazodone And Nefazodone Nausea Only  . Zyrtec [Cetirizine] Other (See Comments)    "takes me to another world"    HOME MEDICATIONS: Outpatient Medications Prior to Visit  Medication Sig Dispense Refill  . busPIRone (BUSPAR) 5 MG tablet Take 5 mg by mouth 2 (two) times daily.    . fluticasone (FLONASE) 50 MCG/ACT nasal spray Place 2 sprays into both nostrils daily as needed for allergies or rhinitis.    . Loratadine (CLARITIN PO) Take by mouth.    . lovastatin (MEVACOR) 40 MG tablet Take 80 mg by mouth every evening.     . Multiple Vitamin (MULTIVITAMIN PO) Take by mouth.    . naproxen (NAPROSYN) 500 MG tablet TAKE 1 TABLET(500 MG) BY MOUTH TWICE DAILY WITH A MEAL 180 tablet 0  . ondansetron (ZOFRAN-ODT) 4 MG disintegrating tablet DIS 1 T ON THE  TONGUE Q 8 H PRF NAUSEA OR VOM  1  . donepezil (ARICEPT) 10 MG tablet TAKE 1 TABLET(10 MG) BY MOUTH AT BEDTIME 30 tablet 1  . memantine (NAMENDA) 5 MG tablet Take 1 tablet (5 mg total) by mouth 2 (two) times daily. 180 tablet 3   No facility-administered medications prior to visit.    PAST MEDICAL HISTORY: Past Medical History:  Diagnosis Date  . Anxiety   . Dementia ([place])   . High cholesterol   . Vertigo     PAST SURGICAL HISTORY: Past Surgical History:  Procedure Laterality Date  . HYSTERECTOMY    . TOTAL KNEE ARTHROPLASTY Right     FAMILY HISTORY: Family History  Problem Relation Age of Onset  . Dementia Neg Hx   . Alzheimer's disease Neg Hx     SOCIAL HISTORY: Social History   Socioeconomic History  . Marital status: Married    Spouse name: Not on file  . Number of children: 2  . Years of education: Not on file  . Highest education level: High  school graduate  Occupational History  . Not on file  Tobacco Use  . Smoking status: Never Smoker  . Smokeless tobacco: Never Used  Substance and Sexual Activity  . Alcohol use: Never  . Drug use: Never  . Sexual activity: Not on file  Other Topics Concern  . Not on file  Social History Narrative   LIves at home with husband and son   Social Determinants of Health   Financial Resource Strain:   . Difficulty of Paying Living Expenses:   Food Insecurity:   . Worried About Charity fundraiser in the Last Year:   . Arboriculturist in the Last Year:   Transportation Needs:   . Film/video editor (Medical):   Marland Kitchen Lack of Transportation (Non-Medical):   Physical Activity:   . Days of Exercise per Week:   . Minutes of Exercise per Session:   Stress:   . Feeling of Stress :   Social Connections:   . Frequency of Communication with Friends and Family:   . Frequency of Social Gatherings with Friends and Family:   . Attends Religious Services:   . Active Member of Clubs or Organizations:   . Attends Theatre manager Meetings:   Marland Kitchen Marital Status:   Intimate Partner Violence:   . Fear of Current or Ex-Partner:   . Emotionally Abused:   Marland Kitchen Physically Abused:   . Sexually Abused:       PHYSICAL EXAM  Vitals:   02/06/20 0951  BP: 127/71  Pulse: 73  Weight: 151 lb (68.5 kg)   Body mass index is 25.92 kg/m.  Generalized: Well developed, in no acute distress  Cardiology: normal rate and rhythm, no murmur noted Respiratory: clear to auscultation bilaterally  Neurological examination  Mentation: Alert, not oriented to time, but is oriented to place and some history taking. Follows all commands speech and language fluent Cranial nerve II-XII: Pupils were equal round reactive to light. Extraocular movements were full, visual field were full  Motor: The motor testing reveals 5 over 5 strength of all 4 extremities. Good symmetric motor tone is noted throughout. .  Coordination: not tested today   Gait and station: Gait is normal.  DIAGNOSTIC DATA (LABS, IMAGING, TESTING) - I reviewed patient records, labs, notes, testing and imaging myself where available.  MMSE - Mini Mental State Exam 02/06/2020 11/06/2019 05/08/2019  Orientation to time 0 3 1  Orientation to Place 3 4 3   Registration 3 3 3   Attention/ Calculation 0 0 0  Recall 2 1 2   Language- name 2 objects 2 2 2   Language- repeat 0 1 0  Language- follow 3 step command 3 3 3   Language- read & follow direction 1 1 1   Write a sentence 1 1 0  Copy design 1 0 0  Total score 16 19 15      Lab Results  Component Value Date   WBC 6.1 05/08/2019   HGB 13.7 05/08/2019   HCT 41.4 05/08/2019   MCV 92 05/08/2019   PLT 357 05/08/2019      Component Value Date/Time   NA 142 05/08/2019 1156   K 4.5 05/08/2019 1156   CL 106 05/08/2019 1156   CO2 24 05/08/2019 1156   GLUCOSE 97 05/08/2019 1156   GLUCOSE 112 (H) 09/21/2016 0424   BUN 11 05/08/2019 1156   CREATININE 0.91 05/08/2019 1156   CALCIUM 10.3 05/08/2019 1156   PROT 6.8  05/08/2019 1156   ALBUMIN 4.1  05/08/2019 1156   AST 13 05/08/2019 1156   ALT 9 05/08/2019 1156   ALKPHOS 91 05/08/2019 1156   BILITOT 0.6 05/08/2019 1156   GFRNONAA 63 05/08/2019 1156   GFRAA 72 05/08/2019 1156   No results found for: CHOL, HDL, LDLCALC, LDLDIRECT, TRIG, CHOLHDL No results found for: HGBA1C Lab Results  Component Value Date   VITAMINB12 287 05/08/2019   Lab Results  Component Value Date   TSH 0.920 05/08/2019     ASSESSMENT AND PLAN 74 y.o. year old female  has a past medical history of Anxiety, Dementia ([place]), High cholesterol, and Vertigo. here with     ICD-10-CM   1. Alzheimer's disease, unspecified (CODE) ([place])  G30.9     Rosebud Poles is doing well today. Her husband feels that memory is stable. She has tolerated Namenda. We will increase dose to 10mg  BID. She will continue Aricept. She was encouraged to continue an active lifestyle. Healthy diet and regular exercise encouraged. Safety precautions reviewed. She will continue close follow up with Dr Harrington Challenger. She will return to see [name] in 6 months. She and her husband verbalize understanding and agreement with this plan.    No orders of the defined types were placed in this encounter.    Meds ordered this encounter  Medications  . donepezil (ARICEPT) 10 MG tablet    Sig: TAKE 1 TABLET(10 MG) BY MOUTH AT BEDTIME    Dispense:  90 tablet    Refill:  3    Order Specific Question:   Supervising Provider    Answer:   Melvenia Beam [id number]  . memantine (NAMENDA) 10 MG tablet    Sig: Take 1 tablet (10 mg total) by mouth 2 (two) times daily.    Dispense:  180 tablet    Refill:  3    Order Specific Question:   Supervising Provider    Answer:   Melvenia Beam [id number]      I spent 15 minutes with the patient. 50% of this time was spent counseling and educating patient on plan of care and medications.    Debbora Presto, FNP-C 02/06/2020, 10:27 AM [facility] [address] [place],  [place] [address] [phone]  Made any corrections needed, and agree with history, physical, neuro exam,assessment and plan as stated.     Sarina Ill, MD [facility]

## 2020-05-04 DIAGNOSIS — F411 Generalized anxiety disorder: Secondary | ICD-10-CM | POA: Diagnosis not present

## 2020-05-04 DIAGNOSIS — Z0000 Encounter for general adult medical examination without abnormal findings: Secondary | ICD-10-CM | POA: Diagnosis not present

## 2020-05-04 DIAGNOSIS — Z79899 Other long term (current) drug therapy: Secondary | ICD-10-CM | POA: Diagnosis not present

## 2020-05-04 DIAGNOSIS — E559 Vitamin D deficiency, unspecified: Secondary | ICD-10-CM | POA: Diagnosis not present

## 2020-05-04 DIAGNOSIS — Z23 Encounter for immunization: Secondary | ICD-10-CM | POA: Diagnosis not present

## 2020-05-04 DIAGNOSIS — R634 Abnormal weight loss: Secondary | ICD-10-CM | POA: Diagnosis not present

## 2020-05-04 DIAGNOSIS — E785 Hyperlipidemia, unspecified: Secondary | ICD-10-CM | POA: Diagnosis not present

## 2020-07-28 ENCOUNTER — Other Ambulatory Visit: Payer: Self-pay

## 2020-07-28 ENCOUNTER — Ambulatory Visit: Payer: PPO | Admitting: Family Medicine

## 2020-07-28 ENCOUNTER — Encounter: Payer: Self-pay | Admitting: Family Medicine

## 2020-07-28 VITALS — BP 124/69 | HR 69 | Ht 64.0 in | Wt 136.0 lb

## 2020-07-28 DIAGNOSIS — G309 Alzheimer's disease, unspecified: Secondary | ICD-10-CM

## 2020-07-28 NOTE — Patient Instructions (Addendum)
Below is our plan:  We will continue Aricept 10mg  daily and Namenda 10mg  twice daily. Continue to be active.   Please make sure you are staying well hydrated. I recommend 50-60 ounces daily. Well balanced diet and regular exercise encouraged.    Please continue follow up with care team as directed.   Follow up in 6-12 months   You may receive a survey regarding today's visit. I encourage you to leave honest feed back as I do use this information to improve patient care. Thank you for seeing me today!     Alzheimer Disease Caregiver Guide  Alzheimer disease causes a person to lose the ability to remember things and make decisions. A person who has Alzheimer disease may not be able to take care of himself or herself. He or she may need help with simple tasks. The tips below can help you care for the person. What kind of changes does this condition cause? This condition makes a person:  Forget things.  Feel confused.  Act differently.  Have different moods. These things get worse with time. Tips to help with symptoms  Be calm and patient.  Respond with a simple, short answer.  Avoid correcting the person in a negative way.  Try not to take things personally, even if the person forgets your name.  Do not argue with the person. This may make the person more upset. Tips to lessen frustration  Make appointments and do daily tasks when the person is at his or her best.  Take your time. Simple tasks may take longer. Allow plenty of time to complete tasks.  Limit choices for the person.  Involve the person in what you are doing.  Keep a daily routine.  Avoid new or crowded places, if possible.  Use simple words, short sentences, and a calm voice. Only give one direction at a time.  Buy clothes and shoes that are easy to put on and take off.  Organize medicines in a pillbox for each day of the week.  Keep a calendar in a central location to remind the person of  meetings or other activities.  Let people help if they offer. Take a break when needed. Tips to prevent injury  Keep floors clear. Remove rugs, magazine racks, and floor lamps.  Keep hallways well-lit.  Put a handrail and non-slip mat in the bathtub or shower.  Put childproof locks on cabinets that have dangerous items in them. These items include medicine, alcohol, guns, toxic cleaning items, sharp tools, matches, and lighters.  Put locks on doors where the person cannot see or reach them. This helps the person to not wander out of the house and get lost.  Be prepared for emergencies. Keep a list of emergency phone numbers and addresses close by.  Bracelets may be worn that track location and identify the person as having memory problems. This should be worn at all times for safety. Tips for the future  Discuss financial and legal planning early. People with this disease have trouble managing their money as the disease gets worse. Get help from a professional.  Talk about advance directives, safety, and daily care. Take these steps: ? Create a living will and choose a power of attorney. This is someone who can make decisions for the person with Alzheimer disease when he or she can no longer do so. ? Discuss driving safety and when to stop driving. The person's doctor can help with this. ? If the person lives alone,  make sure he or she is safe. Some people need extra help at home. Other people need more care at a nursing home or care center. Where to find support You can find support by joining a support group near you. Some benefits of joining a support group include:  Learning ways to manage stress.  Sharing experiences with others.  Getting emotional comfort and support.  Learning about caregiving as the disease progresses.  Knowing what community resources are available and making use of them. Where to find more information  Alzheimer's Association: [employer] Contact a  doctor if:  The person has a fever.  The person has a sudden behavior change that does not get better with calming strategies.  The person is not able to take care of himself or herself at home.  The person threatens you or anyone else, including himself or herself.  You are no longer able to care for the person. Summary  Alzheimer disease causes a person to forget things and to be confused.  A person who has this condition may not be able to take care of himself or herself.  Take steps to keep the person from getting hurt. Plan for future care.  You can find support by joining a support group near you. This information is not intended to replace advice given to you by your health care provider. Make sure you discuss any questions you have with your health care provider. Document Revised: 11/20/2018 Document Reviewed: 07/27/2017 Elsevier Patient Education  [place].   Memory Compensation Strategies  1. Use "WARM" strategy.  W= write it down  A= associate it  R= repeat it  M= make a mental note  2.   You can keep a Social worker.  Use a 3-ring notebook with sections for the following: calendar, important names and phone numbers,  medications, doctors' names/phone numbers, lists/reminders, and a section to journal what you did  each day.   3.    Use a calendar to write appointments down.  4.    Write yourself a schedule for the day.  This can be placed on the calendar or in a separate section of the Memory Notebook.  Keeping a  regular schedule can help memory.  5.    Use medication organizer with sections for each day or morning/evening pills.  You may need help loading it  6.    Keep a basket, or pegboard by the door.  Place items that you need to take out with you in the basket or on the pegboard.  You may also want to  include a message board for reminders.  7.    Use sticky notes.  Place sticky notes with reminders in a place where the task is performed.   For example: " turn off the  stove" placed by the stove, "lock the door" placed on the door at eye level, " take your medications" on  the bathroom mirror or by the place where you normally take your medications.  8.    Use alarms/timers.  Use while cooking to remind yourself to check on food or as a reminder to take your medicine, or as a  reminder to make a call, or as a reminder to perform another task, etc.

## 2020-07-28 NOTE — Progress Notes (Addendum)
Chief Complaint  Patient presents with  . Follow-up    RM 2  . Alzheimer's     HISTORY OF PRESENT ILLNESS: Today 07/28/20  Wendy Watts is a 74 y.o. female here today for follow up for AD. We increased memantine to 10mg  BID and continued Aricept at last visit.  She has tolerated medications well.  No obvious adverse effects.  She feels that memory is fairly stable.  Her husband is with her today and agrees.  No significant changes.  No behavioral changes.  She continues to perform ADLs with minimal assistance.  She does not drive.   HISTORY (copied from previous note)  Wendy Watts is a 74 y.o. female here today for follow up. She has continued Aricept 10mg  daily. Namenda 5mg  BID was added in 10/2019. She has tolerated it well. Memory seems stable. Her husband is with her today and aids in history. She continues to perform ADL's independently. She may need some assistance with getting in and out of the shower. Her daughter lays out her clothes. She does not drive. She reports being active. She likes to dance. No falls. Her husband reports that they are active and get out of the house every day.    HISTORY: (copied from my note on 11/06/2019)  Wendy Watts a 74 y.o.femalehere today for follow up for dementia. She has tolerated Aricept 10mg  daily.Mrs Giebler feels that it has helped. Her daughter is with her today and is not certain. She requires assistance with ADL's.Her daughter will lay out her close but she is able to put garments on herself. Her husband assist her with getting in and out of the bathtub. [name] is able to wash herself. Her husband helps with medications. Husband and daughter help with finances. She does not drive. She did have a fall in October.. She stumbled up set of stairs. She did hit her head but reports only bruising. She has been followed closely by PCP. She was recently started on Buspar. She does feel that it has helped some.Her son is currently  awaiting trial for theft. Her daughter mentions today that patient may have history of bipolar disorder. There is been a strained relationship between mother and daughter for years.   REVIEW OF SYSTEMS: Out of a complete 14 system review of symptoms, the patient complains only of the following symptoms, memory loss and all other reviewed systems are negative.   ALLERGIES: Allergies  Allergen Reactions  . Allegra [Fexofenadine Hcl] Other (See Comments)    "takes me to another world"  . Ativan [Lorazepam] Other (See Comments)    Hallucinations  . Azithromycin Nausea And Vomiting  . Codeine Hives  . Doxycycline Hyclate Nausea Only  . Penicillins Hives    Has patient had a PCN reaction causing immediate rash, facial/tongue/throat swelling, SOB or lightheadedness with hypotension: no Has patient had a PCN reaction causing severe rash involving mucus membranes or skin necrosis: no Has patient had a PCN reaction that required hospitalization: no Has patient had a PCN reaction occurring within the last 10 years: no If all of the above answers are "NO", then may proceed with Cephalosporin use.   . Trazodone And Nefazodone Nausea Only  . Zyrtec [Cetirizine] Other (See Comments)    "takes me to another world"     HOME MEDICATIONS: Outpatient Medications Prior to Visit  Medication Sig Dispense Refill  . busPIRone (BUSPAR) 5 MG tablet Take 5 mg by mouth 2 (two) times daily.    Marland Kitchen  donepezil (ARICEPT) 10 MG tablet TAKE 1 TABLET(10 MG) BY MOUTH AT BEDTIME 90 tablet 3  . fluticasone (FLONASE) 50 MCG/ACT nasal spray Place 2 sprays into both nostrils daily as needed for allergies or rhinitis.    . memantine (NAMENDA) 10 MG tablet Take 1 tablet (10 mg total) by mouth 2 (two) times daily. 180 tablet 3  . Loratadine (CLARITIN PO) Take by mouth.    . lovastatin (MEVACOR) 40 MG tablet Take 80 mg by mouth every evening.     . Multiple Vitamin (MULTIVITAMIN PO) Take by mouth.    . naproxen (NAPROSYN)  500 MG tablet TAKE 1 TABLET(500 MG) BY MOUTH TWICE DAILY WITH A MEAL 180 tablet 0  . ondansetron (ZOFRAN-ODT) 4 MG disintegrating tablet DIS 1 T ON THE TONGUE Q 8 H PRF NAUSEA OR VOM  1   No facility-administered medications prior to visit.     PAST MEDICAL HISTORY: Past Medical History:  Diagnosis Date  . Anxiety   . Dementia ([place])   . High cholesterol   . Vertigo      PAST SURGICAL HISTORY: Past Surgical History:  Procedure Laterality Date  . HYSTERECTOMY    . TOTAL KNEE ARTHROPLASTY Right      FAMILY HISTORY: Family History  Problem Relation Age of Onset  . Dementia Neg Hx   . Alzheimer's disease Neg Hx      SOCIAL HISTORY: Social History   Socioeconomic History  . Marital status: Married    Spouse name: Not on file  . Number of children: 2  . Years of education: Not on file  . Highest education level: High school graduate  Occupational History  . Not on file  Tobacco Use  . Smoking status: Never Smoker  . Smokeless tobacco: Never Used  Substance and Sexual Activity  . Alcohol use: Never  . Drug use: Never  . Sexual activity: Not on file  Other Topics Concern  . Not on file  Social History Narrative   [place] at home with husband and son   Social Determinants of Health   Financial Resource Strain: Not on file  Food Insecurity: Not on file  Transportation Needs: Not on file  Physical Activity: Not on file  Stress: Not on file  Social Connections: Not on file  Intimate Partner Violence: Not on file      PHYSICAL EXAM  Vitals:   07/28/20 1045  BP: 124/69  Pulse: 69  Weight: 136 lb (61.7 kg)  Height: 5\' 4"  (1.626 m)   Body mass index is 23.34 kg/m.   Generalized: Well developed, in no acute distress  Cardiology: normal rate and rhythm, no murmur auscultated  Respiratory: clear to auscultation bilaterally    Neurological examination  Mentation: Alert, she is not oriented to time, but able to tell me place, aid in some history taking.  Follows all commands speech and language fluent Cranial nerve II-XII: Pupils were equal round reactive to light. Extraocular movements were full, visual field were full on confrontational test. Facial sensation and strength were normal. Uvula tongue midline. Head turning and shoulder shrug  were normal and symmetric. Motor: The motor testing reveals 5 over 5 strength of all 4 extremities. Good symmetric motor tone is noted throughout.  Coordination: Cerebellar testing reveals good finger-nose-finger and heel-to-shin bilaterally.  Gait and station: Gait is normal.     DIAGNOSTIC DATA (LABS, IMAGING, TESTING) - I reviewed patient records, labs, notes, testing and imaging myself where available.  Lab Results  Component  Value Date   WBC 6.1 05/08/2019   HGB 13.7 05/08/2019   HCT 41.4 05/08/2019   MCV 92 05/08/2019   PLT 357 05/08/2019      Component Value Date/Time   NA 142 05/08/2019 1156   K 4.5 05/08/2019 1156   CL 106 05/08/2019 1156   CO2 24 05/08/2019 1156   GLUCOSE 97 05/08/2019 1156   GLUCOSE 112 (H) 09/21/2016 0424   BUN 11 05/08/2019 1156   CREATININE 0.91 05/08/2019 1156   CALCIUM 10.3 05/08/2019 1156   PROT 6.8 05/08/2019 1156   ALBUMIN 4.1 05/08/2019 1156   AST 13 05/08/2019 1156   ALT 9 05/08/2019 1156   ALKPHOS 91 05/08/2019 1156   BILITOT 0.6 05/08/2019 1156   GFRNONAA 63 05/08/2019 1156   GFRAA 72 05/08/2019 1156   No results found for: CHOL, HDL, LDLCALC, LDLDIRECT, TRIG, CHOLHDL No results found for: HGBA1C Lab Results  Component Value Date   VITAMINB12 287 05/08/2019   Lab Results  Component Value Date   TSH 0.920 05/08/2019    MMSE - Mini Mental State Exam 07/28/2020 02/06/2020 11/06/2019  Orientation to time 0 0 3  Orientation to Place 4 3 4   Registration 3 3 3   Attention/ Calculation 0 0 0  Recall 1 2 1   Language- name 2 objects 2 2 2   Language- repeat 1 0 1  Language- follow 3 step command 2 3 3   Language- read & follow direction 1 1 1    Write a sentence 1 1 1   Copy design 0 1 0  Total score 15 16 19      ASSESSMENT AND PLAN  74 y.o. year old female  has a past medical history of Anxiety, Dementia ([place]), High cholesterol, and Vertigo. here with   Alzheimer's disease, unspecified (CODE) ([place])  Dystany is doing well, today.  Memory seems to be fairly stable.  MMSE 16 of 30, last 15 of 30. She has tolerated Aricept and Namenda.  We will continue current treatment plan.  She was encouraged to continue regular physical and mental activity.  Memory compensation strategies reviewed.  She will follow-up with me in 6 to 12 months.  She and her husband both verbalized understanding and agreement with this plan.  No orders of the defined types were placed in this encounter.     I spent 20 minutes of face-to-face and non-face-to-face time with patient.  This included previsit chart review, lab review, study review, order entry, electronic health record documentation, patient education.    Debbora Presto, MSN, FNP-C 07/28/2020, 11:11 AM  [facility] [address] [place], [place] [address] [phone]  Made any corrections needed, and agree with history, physical, neuro exam,assessment and plan as stated.     Sarina Ill, MD [facility]

## 2020-09-15 ENCOUNTER — Other Ambulatory Visit: Payer: Self-pay | Admitting: Family Medicine

## 2020-09-15 DIAGNOSIS — Z1231 Encounter for screening mammogram for malignant neoplasm of breast: Secondary | ICD-10-CM

## 2020-11-05 ENCOUNTER — Ambulatory Visit: Payer: PPO

## 2020-12-25 ENCOUNTER — Ambulatory Visit
Admission: RE | Admit: 2020-12-25 | Discharge: 2020-12-25 | Disposition: A | Discharge status: Home / Self Care | Payer: PPO | Source: Ambulatory Visit | Attending: Family Medicine | Admitting: Family Medicine

## 2020-12-25 ENCOUNTER — Other Ambulatory Visit: Payer: Self-pay

## 2020-12-25 DIAGNOSIS — Z1231 Encounter for screening mammogram for malignant neoplasm of breast: Secondary | ICD-10-CM | POA: Diagnosis not present

## 2021-01-25 DIAGNOSIS — K648 Other hemorrhoids: Secondary | ICD-10-CM | POA: Diagnosis not present

## 2021-01-25 DIAGNOSIS — R350 Frequency of micturition: Secondary | ICD-10-CM | POA: Diagnosis not present

## 2021-02-11 ENCOUNTER — Telehealth: Payer: Self-pay | Admitting: Family Medicine

## 2021-02-11 NOTE — Telephone Encounter (Signed)
Pt's husband, [facility] request refill donepezil (ARICEPT) 10 MG tablet at [facility] #[id number]

## 2021-02-12 ENCOUNTER — Telehealth: Payer: Self-pay | Admitting: Family Medicine

## 2021-02-12 ENCOUNTER — Other Ambulatory Visit: Payer: Self-pay | Admitting: Neurology

## 2021-02-12 NOTE — Telephone Encounter (Signed)
Pt's husband, [facility] request refill memantine (NAMENDA) 10 MG tablet at [facility] #[id number]

## 2021-02-16 MED ORDER — MEMANTINE HCL 10 MG PO TABS: 10.0000 mg | ORAL_TABLET | Freq: Two times a day (BID) | ORAL | 3 refills | Status: DC

## 2021-02-16 NOTE — Telephone Encounter (Signed)
Pt's husband came in today to get a refill on his wife's donepezil (Adonepezil (ARICEPT) 10 MG tabletRICEPT) 10 MG tablet. He has called a few times about this but the pharmacy states they haven't received it.

## 2021-02-16 NOTE — Addendum Note (Signed)
Addended by: Brandon Melnick on: 02/16/2021 09:06 AM   Modules accepted: Orders

## 2021-02-17 MED ORDER — DONEPEZIL HCL 10 MG PO TABS: ORAL_TABLET | ORAL | 3 refills | Status: DC

## 2021-02-17 NOTE — Addendum Note (Signed)
Addended by: Roberts Gaudy L on: 02/17/2021 11:12 AM   Modules accepted: Orders

## 2021-02-17 NOTE — Telephone Encounter (Signed)
Husband stopped in office. Requesting refill be sent. Pharmacy does not have. I e-scribed. Ubaldo Glassing let husband know I sent in refill.

## 2021-03-28 ENCOUNTER — Inpatient Hospital Stay (HOSPITAL_COMMUNITY): Payer: PPO | Admitting: Certified Registered Nurse Anesthetist

## 2021-03-28 ENCOUNTER — Encounter (HOSPITAL_COMMUNITY)
Admission: EM | Disposition: A | Discharge status: Skilled Nursing Facility | Payer: Self-pay | Source: Home / Self Care | Attending: Internal Medicine

## 2021-03-28 ENCOUNTER — Inpatient Hospital Stay (HOSPITAL_COMMUNITY): Payer: PPO

## 2021-03-28 ENCOUNTER — Encounter (HOSPITAL_COMMUNITY): Payer: Self-pay | Admitting: Internal Medicine

## 2021-03-28 ENCOUNTER — Inpatient Hospital Stay (HOSPITAL_COMMUNITY)
Admission: EM | Admit: 2021-03-28 | Discharge: 2021-03-30 | DRG: 481 | Disposition: A | Discharge status: Skilled Nursing Facility | Payer: PPO | Attending: Internal Medicine | Admitting: Internal Medicine

## 2021-03-28 ENCOUNTER — Emergency Department (HOSPITAL_COMMUNITY): Payer: PPO

## 2021-03-28 DIAGNOSIS — R2689 Other abnormalities of gait and mobility: Secondary | ICD-10-CM | POA: Diagnosis not present

## 2021-03-28 DIAGNOSIS — D62 Acute posthemorrhagic anemia: Secondary | ICD-10-CM | POA: Diagnosis not present

## 2021-03-28 DIAGNOSIS — M6281 Muscle weakness (generalized): Secondary | ICD-10-CM | POA: Diagnosis not present

## 2021-03-28 DIAGNOSIS — Z20822 Contact with and (suspected) exposure to covid-19: Secondary | ICD-10-CM | POA: Diagnosis present

## 2021-03-28 DIAGNOSIS — Z888 Allergy status to other drugs, medicaments and biological substances status: Secondary | ICD-10-CM | POA: Diagnosis not present

## 2021-03-28 DIAGNOSIS — E7800 Pure hypercholesterolemia, unspecified: Secondary | ICD-10-CM | POA: Diagnosis not present

## 2021-03-28 DIAGNOSIS — R41841 Cognitive communication deficit: Secondary | ICD-10-CM | POA: Diagnosis not present

## 2021-03-28 DIAGNOSIS — Z9889 Other specified postprocedural states: Secondary | ICD-10-CM | POA: Diagnosis not present

## 2021-03-28 DIAGNOSIS — Z886 Allergy status to analgesic agent status: Secondary | ICD-10-CM | POA: Diagnosis not present

## 2021-03-28 DIAGNOSIS — D72829 Elevated white blood cell count, unspecified: Secondary | ICD-10-CM | POA: Diagnosis present

## 2021-03-28 DIAGNOSIS — S72001A Fracture of unspecified part of neck of right femur, initial encounter for closed fracture: Secondary | ICD-10-CM | POA: Diagnosis not present

## 2021-03-28 DIAGNOSIS — Z880 Allergy status to penicillin: Secondary | ICD-10-CM | POA: Diagnosis not present

## 2021-03-28 DIAGNOSIS — F419 Anxiety disorder, unspecified: Secondary | ICD-10-CM | POA: Diagnosis not present

## 2021-03-28 DIAGNOSIS — Z743 Need for continuous supervision: Secondary | ICD-10-CM | POA: Diagnosis not present

## 2021-03-28 DIAGNOSIS — R278 Other lack of coordination: Secondary | ICD-10-CM | POA: Diagnosis not present

## 2021-03-28 DIAGNOSIS — M25572 Pain in left ankle and joints of left foot: Secondary | ICD-10-CM | POA: Diagnosis not present

## 2021-03-28 DIAGNOSIS — S7290XA Unspecified fracture of unspecified femur, initial encounter for closed fracture: Secondary | ICD-10-CM

## 2021-03-28 DIAGNOSIS — Z9181 History of falling: Secondary | ICD-10-CM | POA: Diagnosis not present

## 2021-03-28 DIAGNOSIS — W1809XA Striking against other object with subsequent fall, initial encounter: Secondary | ICD-10-CM | POA: Diagnosis present

## 2021-03-28 DIAGNOSIS — Z96651 Presence of right artificial knee joint: Secondary | ICD-10-CM | POA: Diagnosis not present

## 2021-03-28 DIAGNOSIS — R457 State of emotional shock and stress, unspecified: Secondary | ICD-10-CM | POA: Diagnosis not present

## 2021-03-28 DIAGNOSIS — R9431 Abnormal electrocardiogram [ECG] [EKG]: Secondary | ICD-10-CM | POA: Diagnosis not present

## 2021-03-28 DIAGNOSIS — S72141A Displaced intertrochanteric fracture of right femur, initial encounter for closed fracture: Secondary | ICD-10-CM | POA: Diagnosis not present

## 2021-03-28 DIAGNOSIS — Z881 Allergy status to other antibiotic agents status: Secondary | ICD-10-CM | POA: Diagnosis not present

## 2021-03-28 DIAGNOSIS — R5381 Other malaise: Secondary | ICD-10-CM | POA: Diagnosis not present

## 2021-03-28 DIAGNOSIS — Z885 Allergy status to narcotic agent status: Secondary | ICD-10-CM

## 2021-03-28 DIAGNOSIS — Z79899 Other long term (current) drug therapy: Secondary | ICD-10-CM

## 2021-03-28 DIAGNOSIS — M25551 Pain in right hip: Secondary | ICD-10-CM | POA: Diagnosis not present

## 2021-03-28 DIAGNOSIS — W19XXXA Unspecified fall, initial encounter: Secondary | ICD-10-CM | POA: Diagnosis not present

## 2021-03-28 DIAGNOSIS — S72009A Fracture of unspecified part of neck of unspecified femur, initial encounter for closed fracture: Secondary | ICD-10-CM | POA: Diagnosis present

## 2021-03-28 DIAGNOSIS — Z471 Aftercare following joint replacement surgery: Secondary | ICD-10-CM | POA: Diagnosis not present

## 2021-03-28 DIAGNOSIS — F0390 Unspecified dementia without behavioral disturbance: Secondary | ICD-10-CM | POA: Diagnosis not present

## 2021-03-28 DIAGNOSIS — I10 Essential (primary) hypertension: Secondary | ICD-10-CM | POA: Diagnosis not present

## 2021-03-28 DIAGNOSIS — R4182 Altered mental status, unspecified: Secondary | ICD-10-CM | POA: Diagnosis not present

## 2021-03-28 DIAGNOSIS — Z8781 Personal history of (healed) traumatic fracture: Secondary | ICD-10-CM

## 2021-03-28 DIAGNOSIS — S72001S Fracture of unspecified part of neck of right femur, sequela: Secondary | ICD-10-CM | POA: Diagnosis not present

## 2021-03-28 DIAGNOSIS — R2681 Unsteadiness on feet: Secondary | ICD-10-CM | POA: Diagnosis not present

## 2021-03-28 DIAGNOSIS — F03918 Unspecified dementia, unspecified severity, with other behavioral disturbance: Secondary | ICD-10-CM | POA: Diagnosis present

## 2021-03-28 HISTORY — PX: INTRAMEDULLARY (IM) NAIL INTERTROCHANTERIC: SHX5875

## 2021-03-28 LAB — COMPREHENSIVE METABOLIC PANEL
ALT: 15 U/L (ref 0–44)
AST: 16 U/L (ref 15–41)
Albumin: 3.4 g/dL — ABNORMAL LOW (ref 3.5–5.0)
Alkaline Phosphatase: 66 U/L (ref 38–126)
Anion gap: 9 (ref 5–15)
BUN: 15 mg/dL (ref 8–23)
CO2: 24 mmol/L (ref 22–32)
Calcium: 9.3 mg/dL (ref 8.9–10.3)
Chloride: 108 mmol/L (ref 98–111)
Creatinine, Ser: 0.85 mg/dL (ref 0.44–1.00)
GFR, Estimated: >60 mL/min (ref >60)
Glucose, Bld: 158 mg/dL — ABNORMAL HIGH (ref 70–99)
Potassium: 3.7 mmol/L (ref 3.5–5.1)
Sodium: 141 mmol/L (ref 135–145)
Total Bilirubin: 0.9 mg/dL (ref 0.3–1.2)
Total Protein: 6.3 g/dL — ABNORMAL LOW (ref 6.5–8.1)

## 2021-03-28 LAB — CBC WITH DIFFERENTIAL/PLATELET
Abs Immature Granulocytes: 0.05 10*3/uL (ref 0.00–0.07)
Basophils Absolute: 0.1 10*3/uL (ref 0.0–0.1)
Basophils Relative: 0 %
Eosinophils Absolute: 0.1 10*3/uL (ref 0.0–0.5)
Eosinophils Relative: 0 %
HCT: 40.5 % (ref 36.0–46.0)
Hemoglobin: 13.3 g/dL (ref 12.0–15.0)
Immature Granulocytes: 0 %
Lymphocytes Relative: 7 %
Lymphs Abs: 1 10*3/uL (ref 0.7–4.0)
MCH: 31.1 pg (ref 26.0–34.0)
MCHC: 32.8 g/dL (ref 30.0–36.0)
MCV: 94.6 fL (ref 80.0–100.0)
Monocytes Absolute: 0.6 10*3/uL (ref 0.1–1.0)
Monocytes Relative: 4 %
Neutro Abs: 13.6 10*3/uL — ABNORMAL HIGH (ref 1.7–7.7)
Neutrophils Relative %: 89 %
Platelets: 324 10*3/uL (ref 150–400)
RBC: 4.28 MIL/uL (ref 3.87–5.11)
RDW: 13.2 % (ref 11.5–15.5)
WBC: 15.3 10*3/uL — ABNORMAL HIGH (ref 4.0–10.5)
nRBC: 0 % (ref 0.0–0.2)

## 2021-03-28 LAB — RESP PANEL BY RT-PCR (FLU A&B, COVID) ARPGX2
Influenza A by PCR: NEGATIVE
Influenza B by PCR: NEGATIVE
SARS Coronavirus 2 by RT PCR: NEGATIVE

## 2021-03-28 LAB — TYPE AND SCREEN
ABO/RH(D): A POS
Antibody Screen: NEGATIVE

## 2021-03-28 LAB — TSH: TSH: 0.562 u[IU]/mL (ref 0.350–4.500)

## 2021-03-28 SURGERY — FIXATION, FRACTURE, INTERTROCHANTERIC, WITH INTRAMEDULLARY ROD
Anesthesia: Spinal | Site: Hip | Laterality: Right

## 2021-03-28 MED ORDER — ACETAMINOPHEN 10 MG/ML IV SOLN
INTRAVENOUS | Status: DC | PRN
  Administered 2021-03-28: 1000 mg via INTRAVENOUS

## 2021-03-28 MED ORDER — MORPHINE SULFATE (PF) 4 MG/ML IV SOLN
INTRAVENOUS | Status: DC | PRN
  Administered 2021-03-28: 8 mg

## 2021-03-28 MED ORDER — PHENYLEPHRINE HCL-NACL 20-0.9 MG/250ML-% IV SOLN
INTRAVENOUS | Status: DC | PRN
  Administered 2021-03-28: 40 ug/min via INTRAVENOUS

## 2021-03-28 MED ORDER — TRANEXAMIC ACID-NACL 1000-0.7 MG/100ML-% IV SOLN
1000.0000 mg | INTRAVENOUS | Status: AC
  Administered 2021-03-28: 1000 mg via INTRAVENOUS

## 2021-03-28 MED ORDER — CEFAZOLIN SODIUM-DEXTROSE 2-3 GM-%(50ML) IV SOLR
INTRAVENOUS | Status: DC | PRN
  Administered 2021-03-28: 2 g via INTRAVENOUS

## 2021-03-28 MED ORDER — ISOPROPYL ALCOHOL 70 % SOLN
Status: AC
  Filled 2021-03-28: qty 480

## 2021-03-28 MED ORDER — SODIUM CHLORIDE 0.9 % IV SOLN: INTRAVENOUS | Status: DC

## 2021-03-28 MED ORDER — LACTATED RINGERS IV SOLN: INTRAVENOUS | Status: DC | PRN

## 2021-03-28 MED ORDER — TRANEXAMIC ACID-NACL 1000-0.7 MG/100ML-% IV SOLN
INTRAVENOUS | Status: AC
  Filled 2021-03-28: qty 100

## 2021-03-28 MED ORDER — METHOCARBAMOL 500 MG PO TABS
500.0000 mg | ORAL_TABLET | Freq: Four times a day (QID) | ORAL | Status: DC | PRN
  Administered 2021-03-29 (×2): 500 mg via ORAL

## 2021-03-28 MED ORDER — POVIDONE-IODINE 10 % EX SWAB: 2.0000 "application " | Freq: Once | CUTANEOUS | Status: DC

## 2021-03-28 MED ORDER — POLYETHYLENE GLYCOL 3350 17 G PO PACK: 17.0000 g | PACK | Freq: Every day | ORAL | Status: DC | PRN

## 2021-03-28 MED ORDER — ONDANSETRON HCL 4 MG/2ML IJ SOLN
INTRAMUSCULAR | Status: DC | PRN
  Administered 2021-03-28: 4 mg via INTRAVENOUS

## 2021-03-28 MED ORDER — KETAMINE HCL 10 MG/ML IJ SOLN
INTRAMUSCULAR | Status: AC
  Filled 2021-03-28: qty 1

## 2021-03-28 MED ORDER — PROPOFOL 10 MG/ML IV BOLUS
INTRAVENOUS | Status: AC
  Filled 2021-03-28: qty 20

## 2021-03-28 MED ORDER — ONDANSETRON HCL 4 MG/2ML IJ SOLN
4.0000 mg | Freq: Once | INTRAMUSCULAR | Status: AC
  Administered 2021-03-28: 4 mg via INTRAVENOUS
  Filled 2021-03-28: qty 2

## 2021-03-28 MED ORDER — CHLORHEXIDINE GLUCONATE 4 % EX LIQD: 60.0000 mL | Freq: Once | CUTANEOUS | Status: DC

## 2021-03-28 MED ORDER — PROPOFOL 10 MG/ML IV BOLUS
INTRAVENOUS | Status: DC | PRN
  Administered 2021-03-28: 30 mg via INTRAVENOUS
  Administered 2021-03-28: 20 mg via INTRAVENOUS

## 2021-03-28 MED ORDER — TRAMADOL HCL 50 MG PO TABS
50.0000 mg | ORAL_TABLET | Freq: Four times a day (QID) | ORAL | Status: DC | PRN
Start: 2021-03-28 — End: 2021-03-31
  Administered 2021-03-29 – 2021-03-30 (×4): 50 mg via ORAL
  Filled 2021-03-28 (×4): qty 1

## 2021-03-28 MED ORDER — DOCUSATE SODIUM 100 MG PO CAPS
100.0000 mg | ORAL_CAPSULE | Freq: Two times a day (BID) | ORAL | Status: DC
  Administered 2021-03-30: 100 mg via ORAL
  Filled 2021-03-28 (×4): qty 1

## 2021-03-28 MED ORDER — PROPOFOL 500 MG/50ML IV EMUL
INTRAVENOUS | Status: DC | PRN
  Administered 2021-03-28: 75 ug/kg/min via INTRAVENOUS

## 2021-03-28 MED ORDER — ENOXAPARIN SODIUM 40 MG/0.4ML IJ SOSY
40.0000 mg | PREFILLED_SYRINGE | INTRAMUSCULAR | Status: DC
  Administered 2021-03-29 – 2021-03-30 (×2): 40 mg via SUBCUTANEOUS
  Filled 2021-03-28 (×2): qty 0.4

## 2021-03-28 MED ORDER — KETAMINE HCL 10 MG/ML IJ SOLN
INTRAMUSCULAR | Status: DC | PRN
  Administered 2021-03-28: 20 mg via INTRAVENOUS

## 2021-03-28 MED ORDER — EPHEDRINE SULFATE-NACL 50-0.9 MG/10ML-% IV SOSY
PREFILLED_SYRINGE | INTRAVENOUS | Status: DC | PRN
  Administered 2021-03-28 (×2): 10 mg via INTRAVENOUS
  Administered 2021-03-28: 5 mg via INTRAVENOUS

## 2021-03-28 MED ORDER — VANCOMYCIN HCL IN DEXTROSE 1-5 GM/200ML-% IV SOLN: 1000.0000 mg | INTRAVENOUS | Status: AC

## 2021-03-28 MED ORDER — ALBUMIN HUMAN 5 % IV SOLN
INTRAVENOUS | Status: AC
  Filled 2021-03-28: qty 250

## 2021-03-28 MED ORDER — FENTANYL CITRATE (PF) 100 MCG/2ML IJ SOLN
50.0000 ug | Freq: Once | INTRAMUSCULAR | Status: AC
  Administered 2021-03-28: 50 ug via INTRAVENOUS
  Filled 2021-03-28: qty 2

## 2021-03-28 MED ORDER — PHENYLEPHRINE HCL-NACL 20-0.9 MG/250ML-% IV SOLN
INTRAVENOUS | Status: AC
  Filled 2021-03-28: qty 250

## 2021-03-28 MED ORDER — ISOPROPYL ALCOHOL 70 % SOLN
Status: DC | PRN
  Administered 2021-03-28: 1 via TOPICAL

## 2021-03-28 MED ORDER — FENTANYL CITRATE (PF) 100 MCG/2ML IJ SOLN
INTRAMUSCULAR | Status: DC | PRN
  Administered 2021-03-28 (×2): 50 ug via INTRAVENOUS

## 2021-03-28 MED ORDER — HYDROMORPHONE HCL 1 MG/ML IJ SOLN
0.5000 mg | INTRAMUSCULAR | Status: DC | PRN
  Administered 2021-03-28 – 2021-03-29 (×2): 0.5 mg via INTRAVENOUS
  Filled 2021-03-28: qty 1
  Filled 2021-03-28: qty 0.5

## 2021-03-28 MED ORDER — METHOCARBAMOL 500 MG PO TABS
500.0000 mg | ORAL_TABLET | Freq: Three times a day (TID) | ORAL | Status: DC | PRN
  Administered 2021-03-30: 500 mg via ORAL
  Filled 2021-03-28 (×3): qty 1

## 2021-03-28 MED ORDER — PHENYLEPHRINE HCL (PRESSORS) 10 MG/ML IV SOLN
INTRAVENOUS | Status: AC
  Filled 2021-03-28: qty 1

## 2021-03-28 MED ORDER — FENTANYL CITRATE (PF) 100 MCG/2ML IJ SOLN
INTRAMUSCULAR | Status: AC
  Filled 2021-03-28: qty 2

## 2021-03-28 MED ORDER — CEFAZOLIN SODIUM-DEXTROSE 2-4 GM/100ML-% IV SOLN
INTRAVENOUS | Status: AC
  Filled 2021-03-28: qty 100

## 2021-03-28 MED ORDER — BUPIVACAINE HCL (PF) 0.25 % IJ SOLN
INTRAMUSCULAR | Status: DC | PRN
  Administered 2021-03-28: 30 mL

## 2021-03-28 MED ORDER — METHOCARBAMOL 500 MG IVPB - SIMPLE MED
500.0000 mg | Freq: Four times a day (QID) | INTRAVENOUS | Status: DC | PRN
  Administered 2021-03-29: 500 mg via INTRAVENOUS
  Filled 2021-03-28: qty 50

## 2021-03-28 MED ORDER — VANCOMYCIN HCL IN DEXTROSE 1-5 GM/200ML-% IV SOLN
INTRAVENOUS | Status: AC
  Filled 2021-03-28: qty 200

## 2021-03-28 MED ORDER — BUPIVACAINE IN DEXTROSE 0.75-8.25 % IT SOLN
INTRATHECAL | Status: DC | PRN
  Administered 2021-03-28: 1.6 mL via INTRATHECAL

## 2021-03-28 MED ORDER — DEXAMETHASONE SODIUM PHOSPHATE 10 MG/ML IJ SOLN
INTRAMUSCULAR | Status: DC | PRN
  Administered 2021-03-28: 5 mg via INTRAVENOUS

## 2021-03-28 MED ORDER — MORPHINE SULFATE (PF) 4 MG/ML IV SOLN
INTRAVENOUS | Status: AC
  Filled 2021-03-28: qty 2

## 2021-03-28 MED ORDER — PHENYLEPHRINE 40 MCG/ML (10ML) SYRINGE FOR IV PUSH (FOR BLOOD PRESSURE SUPPORT)
PREFILLED_SYRINGE | INTRAVENOUS | Status: DC | PRN
  Administered 2021-03-28 (×2): 120 ug via INTRAVENOUS
  Administered 2021-03-28: 80 ug via INTRAVENOUS
  Administered 2021-03-28: 160 ug via INTRAVENOUS
  Administered 2021-03-28: 120 ug via INTRAVENOUS

## 2021-03-28 MED ORDER — ACETAMINOPHEN 10 MG/ML IV SOLN
INTRAVENOUS | Status: AC
  Filled 2021-03-28: qty 100

## 2021-03-28 MED ORDER — TRANEXAMIC ACID-NACL 1000-0.7 MG/100ML-% IV SOLN
1000.0000 mg | Freq: Once | INTRAVENOUS | Status: AC
  Administered 2021-03-29: 1000 mg via INTRAVENOUS

## 2021-03-28 MED ORDER — METHOCARBAMOL 1000 MG/10ML IJ SOLN
500.0000 mg | Freq: Four times a day (QID) | INTRAVENOUS | Status: DC | PRN
  Filled 2021-03-28: qty 5

## 2021-03-28 MED ORDER — ALBUMIN HUMAN 5 % IV SOLN
12.5000 g | Freq: Once | INTRAVENOUS | Status: AC
  Administered 2021-03-28: 12.5 g via INTRAVENOUS

## 2021-03-28 MED ORDER — CLONIDINE HCL (ANALGESIA) 100 MCG/ML EP SOLN
EPIDURAL | Status: DC | PRN
  Administered 2021-03-28: 1 mL via INTRA_ARTICULAR

## 2021-03-28 MED ORDER — BUPIVACAINE HCL 0.25 % IJ SOLN
INTRAMUSCULAR | Status: AC
  Filled 2021-03-28: qty 1

## 2021-03-28 SURGICAL SUPPLY — 45 items
BAG COUNTER SPONGE SURGICOUNT (BAG) IMPLANT
BAG SURGICOUNT SPONGE COUNTING (BAG)
BIT DRILL INTERTAN LAG SCREW (BIT) ×3 IMPLANT
BLADE SURG 15 STRL LF DISP TIS (BLADE) ×1 IMPLANT
BLADE SURG 15 STRL SS (BLADE) ×3
BNDG GAUZE ELAST 4 BULKY (GAUZE/BANDAGES/DRESSINGS) ×3 IMPLANT
COVER PERINEAL POST (MISCELLANEOUS) ×3 IMPLANT
COVER SURGICAL LIGHT HANDLE (MISCELLANEOUS) ×3 IMPLANT
DRAPE INCISE IOBAN 66X45 STRL (DRAPES) ×3 IMPLANT
DRAPE STERI IOBAN 125X83 (DRAPES) ×3 IMPLANT
DRESSING AQUACEL AG SP 3.5X4 (GAUZE/BANDAGES/DRESSINGS) ×2 IMPLANT
DRESSING AQUACEL AG SP 3.5X6 (GAUZE/BANDAGES/DRESSINGS) ×1 IMPLANT
DRSG AQUACEL AG ADV 3.5X 4 (GAUZE/BANDAGES/DRESSINGS) ×6 IMPLANT
DRSG AQUACEL AG SP 3.5X4 (GAUZE/BANDAGES/DRESSINGS) ×6
DRSG AQUACEL AG SP 3.5X6 (GAUZE/BANDAGES/DRESSINGS) ×3
DURAPREP 26ML APPLICATOR (WOUND CARE) ×3 IMPLANT
ELECT REM PT RETURN 15FT ADLT (MISCELLANEOUS) ×3 IMPLANT
GAUZE SPONGE 4X4 12PLY STRL (GAUZE/BANDAGES/DRESSINGS) ×3 IMPLANT
GAUZE XEROFORM 1X8 LF (GAUZE/BANDAGES/DRESSINGS) ×3 IMPLANT
GLOVE SRG 8 PF TXTR STRL LF DI (GLOVE) ×1 IMPLANT
GLOVE SURG ORTHO LTX SZ8 (GLOVE) ×3 IMPLANT
GLOVE SURG UNDER POLY LF SZ8 (GLOVE) ×3
GOWN STRL REUS W/TWL LRG LVL3 (GOWN DISPOSABLE) ×3 IMPLANT
GUIDE PIN 3.2X343 (PIN) ×2
GUIDE PIN 3.2X343MM (PIN) ×6
KIT BASIN OR (CUSTOM PROCEDURE TRAY) ×3 IMPLANT
KIT TURNOVER KIT A (KITS) ×3 IMPLANT
MANIFOLD NEPTUNE II (INSTRUMENTS) ×3 IMPLANT
MARKER SKIN DUAL TIP RULER LAB (MISCELLANEOUS) ×3 IMPLANT
NAIL TRIGEN 10MMX36CM-125 RT (Nail) ×3 IMPLANT
NS IRRIG 1000ML POUR BTL (IV SOLUTION) ×3 IMPLANT
PACK GENERAL/GYN (CUSTOM PROCEDURE TRAY) ×3 IMPLANT
PENCIL SMOKE EVACUATOR (MISCELLANEOUS) IMPLANT
PIN GUIDE 3.2X343MM (PIN) ×2 IMPLANT
SCREW LAG COMPR KIT 95/90 (Screw) ×3 IMPLANT
SOL PREP POV-IOD 4OZ 10% (MISCELLANEOUS) ×3 IMPLANT
SPONGE T-LAP 4X18 ~~LOC~~+RFID (SPONGE) ×3 IMPLANT
STAPLER VISISTAT 35W (STAPLE) ×3 IMPLANT
SUT ETHILON 3 0 PS 1 (SUTURE) ×6 IMPLANT
SUT VIC AB 1 CT1 27 (SUTURE) ×6
SUT VIC AB 1 CT1 27XBRD ANBCTR (SUTURE) ×2 IMPLANT
SUT VIC AB 2-0 CT1 27 (SUTURE) ×6
SUT VIC AB 2-0 CT1 TAPERPNT 27 (SUTURE) ×2 IMPLANT
SUT VICRYL 0 UR6 27IN ABS (SUTURE) ×6 IMPLANT
TOWEL OR 17X26 10 PK STRL BLUE (TOWEL DISPOSABLE) ×3 IMPLANT

## 2021-03-28 NOTE — ED Triage Notes (Signed)
Pt to ED via EMS from home c/o fall with right hip pian. Obvious shortening and rotation right leg. No LOC. Pt did not hit head. Mechanical fall, tripped on rug. Not on blood thinners. Hx dementia, per family at the home pt orientation at baseline. 100 Fentanyl given by EMS. #18 R hand. Last VS: 160/74, hr 76, RR 24, CBG 123. 98%ra, cbg 123.

## 2021-03-28 NOTE — Transfer of Care (Signed)
Immediate Anesthesia Transfer of Care Note  Patient: Wendy Watts  Procedure(s) Performed: INTRAMEDULLARY (IM) NAIL INTERTROCHANTRIC (Right: Hip)  Patient Location: PACU  Anesthesia Type:Spinal  Level of Consciousness: sedated, patient cooperative and responds to stimulation  Airway & Oxygen Therapy: Patient Spontanous Breathing and Patient connected to face mask oxygen  Post-op Assessment: Report given to RN, Post -op Vital signs reviewed and stable and Phenylprine drip restated MDA at bedside  Post vital signs: Reviewed and stable  Last Vitals:  Vitals Value Taken Time  BP    Temp    Pulse    Resp    SpO2      Last Pain:  Vitals:   03/28/21 1346  TempSrc:   PainSc: 10-Worst pain ever         Complications: No notable events documented.

## 2021-03-28 NOTE — ED Notes (Signed)
Pt transported to xray 

## 2021-03-28 NOTE — Anesthesia Postprocedure Evaluation (Signed)
Anesthesia Post Note  Patient: Gisell Nuernberger Petronio  Procedure(s) Performed: INTRAMEDULLARY (IM) NAIL INTERTROCHANTRIC (Right: Hip)     Patient location during evaluation: PACU Anesthesia Type: Spinal Level of consciousness: oriented, awake and alert and awake Pain management: pain level controlled Vital Signs Assessment: post-procedure vital signs reviewed and stable Respiratory status: spontaneous breathing, respiratory function stable and nonlabored ventilation Cardiovascular status: blood pressure returned to baseline, stable and bradycardic Postop Assessment: no headache, no backache, no apparent nausea or vomiting, spinal receding and patient able to bend at knees Anesthetic complications: no   No notable events documented.  Last Vitals:  Vitals:   03/28/21 2315 03/28/21 2320  BP: (!) 98/57 (!) 105/51  Pulse: 82 (!) 57  Resp: 15 10  Temp:    SpO2: 98% 98%    Last Pain:  Vitals:   03/28/21 2230  TempSrc:   PainSc: 0-No pain                 Catalina Gravel

## 2021-03-28 NOTE — ED Notes (Signed)
Pt transported to PACU 

## 2021-03-28 NOTE — Anesthesia Procedure Notes (Signed)
Spinal  Patient location during procedure: OR Start time: 03/28/2021 7:48 PM End time: 03/28/2021 7:54 PM Reason for block: surgical anesthesia Staffing Performed: anesthesiologist  Anesthesiologist: Catalina Gravel, MD Preanesthetic Checklist Completed: patient identified, IV checked, risks and benefits discussed, surgical consent, monitors and equipment checked, pre-op evaluation and timeout performed Spinal Block Patient position: right lateral decubitus Prep: DuraPrep and site prepped and draped Patient monitoring: continuous pulse ox and blood pressure Approach: midline Location: L3-4 Injection technique: single-shot Needle Needle type: Pencan  Needle gauge: 24 G Assessment Sensory level: T8 Events: CSF return Additional Notes Functioning IV was confirmed and monitors were applied. Sterile prep and drape, including hand hygiene, mask and sterile gloves were used. The patient was positioned and the spine was prepped. The skin was anesthetized with lidocaine.  Free flow of clear CSF was obtained prior to injecting local anesthetic into the CSF.  The spinal needle aspirated freely following injection.  The needle was carefully withdrawn.  The patient tolerated the procedure well. Consent was obtained prior to procedure with all questions answered and concerns addressed. Risks including but not limited to bleeding, infection, nerve damage, paralysis, failed block, inadequate analgesia, allergic reaction, high spinal, itching and headache were discussed and the patient wished to proceed.   Hoy Morn, MD

## 2021-03-28 NOTE — ED Provider Notes (Signed)
[facility] DEPT Provider Note   CSN: [id number] Arrival date & time: 03/28/21  1109     History Chief Complaint  Patient presents with   Hip Pain    right   Fall    Wendy Watts is a 75 y.o. female.  HPI She presents for evaluation of injury that occurred this morning.  She came by EMS from her home.  She is unable to specify what happened.  EMS report a mechanical fall, after she tripped on a rug.  He apparently got this information from a family member who was at the home.  Patient was treated with fentanyl during transport.  They placed a cervical collar on her.  Level 5 caveat-altered mental status    Past Medical History:  Diagnosis Date   Anxiety    Dementia ([place])    High cholesterol    Vertigo     Patient Active Problem List   Diagnosis Date Noted   Dementia without behavioral disturbance ([place]) 05/08/2019    Past Surgical History:  Procedure Laterality Date   HYSTERECTOMY     TOTAL KNEE ARTHROPLASTY Right      OB History   No obstetric history on file.     Family History  Problem Relation Age of Onset   Dementia Neg Hx    Alzheimer's disease Neg Hx     Social History   Tobacco Use   Smoking status: Never   Smokeless tobacco: Never  Substance Use Topics   Alcohol use: Never   Drug use: Never    Home Medications Prior to Admission medications   Medication Sig Start Date End Date Taking? Authorizing Provider  busPIRone (BUSPAR) 10 MG tablet Take 10 mg by mouth 2 (two) times daily. 02/11/21  Yes [provider]  donepezil (ARICEPT) 10 MG tablet TAKE 1 TABLET(10 MG) BY MOUTH AT BEDTIME Patient taking differently: Take 10 mg by mouth at bedtime. 02/17/21  Yes Lomax, Amy, NP  fluticasone (FLONASE) 50 MCG/ACT nasal spray Place 2 sprays into both nostrils daily as needed for allergies or rhinitis.   Yes [provider]  memantine (NAMENDA) 10 MG tablet Take 1 tablet (10 mg total) by mouth 2 (two) times  daily. 02/16/21  Yes Lomax, Amy, NP  VITAMIN D PO Take 1 tablet by mouth daily.   Yes [provider]    Allergies    Allegra [fexofenadine hcl], Ativan [lorazepam], Azithromycin, Codeine, Doxycycline hyclate, Penicillins, Trazodone and nefazodone, and Zyrtec [cetirizine]  Review of Systems   Review of Systems  Unable to perform ROS: Mental status change   Physical Exam Updated Vital Signs BP 100/74   Pulse 65   Temp 98.2 F (36.8 C) (Oral)   Resp 16   Ht '5\' 5"'$  (1.651 m)   Wt 64.4 kg   SpO2 99%   BMI 23.63 kg/m   Physical Exam Vitals and nursing note reviewed.  Constitutional:      Appearance: She is well-developed. She is not ill-appearing.  HENT:     Head: Normocephalic and atraumatic.     Right Ear: External ear normal.     Left Ear: External ear normal.  Eyes:     Conjunctiva/sclera: Conjunctivae normal.     Pupils: Pupils are equal, round, and reactive to light.  Neck:     Trachea: Phonation normal.  Cardiovascular:     Rate and Rhythm: Normal rate and regular rhythm.     Heart sounds: Normal heart sounds.  Pulmonary:  Effort: Pulmonary effort is normal.     Breath sounds: Normal breath sounds.  Abdominal:     Palpations: Abdomen is soft.     Tenderness: There is no abdominal tenderness.  Musculoskeletal:        General: Normal range of motion.     Cervical back: Normal range of motion and neck supple.  Skin:    General: Skin is warm and dry.  Neurological:     Mental Status: She is alert and oriented to person, place, and time.     Cranial Nerves: No cranial nerve deficit.     Sensory: No sensory deficit.     Motor: No abnormal muscle tone.     Coordination: Coordination normal.  Psychiatric:        Behavior: Behavior normal.        Thought Content: Thought content normal.        Judgment: Judgment normal.    ED Results / Procedures / Treatments   Labs (all labs ordered are listed, but only abnormal results are displayed) Labs Reviewed   COMPREHENSIVE METABOLIC PANEL - Abnormal; Notable for the following components:      Result Value   Glucose, Bld 158 (*)    Total Protein 6.3 (*)    Albumin 3.4 (*)    All other components within normal limits  CBC WITH DIFFERENTIAL/PLATELET - Abnormal; Notable for the following components:   WBC 15.3 (*)    Neutro Abs 13.6 (*)    All other components within normal limits  SARS CORONAVIRUS 2 (TAT 6-24 HRS)  URINALYSIS, ROUTINE W REFLEX MICROSCOPIC    EKG EKG Interpretation  Date/Time:  Sunday March 28 2021 12:15:20 EDT Ventricular Rate:  64 PR Interval:  174 QRS Duration: 98 QT Interval:  438 QTC Calculation: 452 R Axis:   50 Text Interpretation: Sinus rhythm Low voltage, precordial leads Nonspecific T abnormalities, diffuse leads since last tracing no significant change Confirmed by Daleen Bo [phone]) on 03/28/2021 2:26:50 PM  Radiology DG Chest 1 View  Result Date: 03/28/2021 CLINICAL DATA:  Fall with RIGHT hip pain. EXAM: CHEST  1 VIEW COMPARISON:  Chest x-ray dated 03/20/2014. FINDINGS: The heart size and mediastinal contours are within normal limits. Both lungs are clear. The visualized skeletal structures are unremarkable. IMPRESSION: No active disease. Electronically Signed   By: Franki Cabot M.D.   On: 03/28/2021 13:52   DG Hip Unilat With Pelvis 2-3 Views Right  Result Date: 03/28/2021 CLINICAL DATA:  Fall with RIGHT hip pain. EXAM: DG HIP (WITH OR WITHOUT PELVIS) 2-3V RIGHT COMPARISON:  None. FINDINGS: Markedly displaced/comminuted fracture of the RIGHT femoral neck, centered at the trochanters, with 90 degree angulation deformity at the fracture site. Femoral head remains grossly well positioned relative to the acetabulum. Remainder of the osseous pelvis appears intact and normally aligned. IMPRESSION: Markedly displaced/comminuted fracture of the RIGHT femoral neck, intertrochanteric, with 90 degree angulation deformity at the fracture site. Electronically Signed    By: Franki Cabot M.D.   On: 03/28/2021 13:51    Procedures Procedures   Medications Ordered in ED Medications  fentaNYL (SUBLIMAZE) injection 50 mcg (50 mcg Intravenous Given 03/28/21 1257)  ondansetron (ZOFRAN) injection 4 mg (4 mg Intravenous Given 03/28/21 1253)    ED Course  I have reviewed the triage vital signs and the nursing notes.  Pertinent labs & imaging results that were available during my care of the patient were reviewed by me and considered in my medical decision making (see chart  for details).  Clinical Course as of 03/28/21 1509  Sun Mar 28, 2021  1442 Case discussed with Dr. Marlou Sa, on-call for orthopedics who plans on surgical repair later this evening around 7 PM.  He states patient can be admitted to [facility]. [EW]    Clinical Course User Index [EW] Daleen Bo, MD   MDM Rules/Calculators/A&P                            Patient Vitals for the past 24 hrs:  BP Temp Temp src Pulse Resp SpO2 Height Weight  03/28/21 1436 100/74 -- -- 65 16 99 % -- --  03/28/21 1415 118/62 -- -- 66 (!) 21 99 % -- --  03/28/21 1358 (!) 118/56 -- -- 66 14 98 % -- --  03/28/21 1300 137/70 -- -- 62 14 99 % -- --  03/28/21 1245 138/80 -- -- 74 11 100 % -- --  03/28/21 1215 134/71 -- -- 68 13 99 % -- --  03/28/21 1126 -- -- -- -- -- -- '5\' 5"'$  (1.651 m) 64.4 kg  03/28/21 1124 (!) 150/73 98.2 F (36.8 C) Oral 70 16 97 % -- --    2:45 PM-she is comfortable has no further complaints- reevaluation with update and discussion. After initial assessment and treatment, an updated evaluation reveals questions answered. Daleen Bo   Medical Decision Making:  This patient is presenting for evaluation of mechanical fall, which does require a range of treatment options, and is a complaint that involves a high risk of morbidity and mortality. The differential diagnoses include fracture, contusion. I decided to review old records, and in summary elderly female with dementia who  fell after tripping on a rug.  I received additional historical information from husband at bedside.  Clinical Laboratory Tests Ordered, included CBC, Metabolic panel, and COVID test . Review indicates COVID pending, white count slightly elevated, glucose high, total protein low, albumin low. Radiologic Tests Ordered, included right hip and chest x-ray.  I independently Visualized: Radiographic images, which show normal except right hip intertrochanteric fracture with angulation   Critical Interventions-clinical evaluation, laboratory testing, medication treatment, radiography, observation reassessment  After These Interventions, the Patient was reevaluated and was found with hip fracture requiring surgical intervention.  Consulted orthopedics and hospitalist.  CRITICAL CARE-no Performed by: Daleen Bo  Nursing Notes Reviewed/ Care Coordinated Applicable Imaging Reviewed Interpretation of Laboratory Data incorporated into ED treatment   3:00 PM-Consult complete with hospitalist. Patient case explained and discussed.  She agrees to admit patient for further evaluation and treatment. Call ended at 3:08 PM  Final Clinical Impression(s) / ED Diagnoses Final diagnoses:  Fall, initial encounter  Closed fracture of right hip, initial encounter [facility])    Rx / DC Orders ED Discharge Orders     None        Daleen Bo, MD 03/28/21 1511

## 2021-03-28 NOTE — H&P (Signed)
PREOPERATIVE H&P  Chief Complaint: "My right hip hurts"  HPI: Wendy Watts is a 75 y.o. female who presents for evaluation of right hip pain. It has been present for 1 day and has been worsening. She has failed conservative measures. Pain is rated as moderate.  She sustained a fall this morning after tripping over a rug.  Sustained a right hip intertrochanteric fracture.  No other joint complaints.  She does have history of right knee arthroplasty.  No prior hip surgery.  Walks with no walker or cane at baseline.  Does have history of dementia.  No blood thinners.  Past Medical History:  Diagnosis Date   Anxiety    Dementia ([place])    High cholesterol    Vertigo    Past Surgical History:  Procedure Laterality Date   HYSTERECTOMY     TOTAL KNEE ARTHROPLASTY Right    Social History   Socioeconomic History   Marital status: Married    Spouse name: Not on file   Number of children: 2   Years of education: Not on file   Highest education level: High school graduate  Occupational History   Not on file  Tobacco Use   Smoking status: Never   Smokeless tobacco: Never  Substance and Sexual Activity   Alcohol use: Never   Drug use: Never   Sexual activity: Not on file  Other Topics Concern   Not on file  Social History Narrative   LIves at home with husband and son   Social Determinants of Health   Financial Resource Strain: Not on file  Food Insecurity: Not on file  Transportation Needs: Not on file  Physical Activity: Not on file  Stress: Not on file  Social Connections: Not on file   Family History  Problem Relation Age of Onset   Dementia Neg Hx    Alzheimer's disease Neg Hx    Allergies  Allergen Reactions   Allegra [Fexofenadine Hcl] Other (See Comments)    "takes me to another world"   Ativan [Lorazepam] Other (See Comments)    Hallucinations   Azithromycin Nausea And Vomiting   Codeine Hives   Doxycycline Hyclate Nausea Only   Penicillins Hives    Has  patient had a PCN reaction causing immediate rash, facial/tongue/throat swelling, SOB or lightheadedness with hypotension: no Has patient had a PCN reaction causing severe rash involving mucus membranes or skin necrosis: no Has patient had a PCN reaction that required hospitalization: no Has patient had a PCN reaction occurring within the last 10 years: no If all of the above answers are "NO", then may proceed with Cephalosporin use.    Trazodone And Nefazodone Nausea Only   Zyrtec [Cetirizine] Other (See Comments)    "takes me to another world"   Prior to Admission medications   Medication Sig Start Date End Date Taking? Authorizing Provider  busPIRone (BUSPAR) 10 MG tablet Take 10 mg by mouth 2 (two) times daily. 02/11/21  Yes [provider]  donepezil (ARICEPT) 10 MG tablet TAKE 1 TABLET(10 MG) BY MOUTH AT BEDTIME Patient taking differently: Take 10 mg by mouth at bedtime. 02/17/21  Yes Lomax, Amy, NP  fluticasone (FLONASE) 50 MCG/ACT nasal spray Place 2 sprays into both nostrils daily as needed for allergies or rhinitis.   Yes [provider]  memantine (NAMENDA) 10 MG tablet Take 1 tablet (10 mg total) by mouth 2 (two) times daily. 02/16/21  Yes Lomax, Amy, NP  VITAMIN D PO Take 1 tablet  by mouth daily.   Yes [provider]     Positive ROS: Right hip pain  All other systems have been reviewed and were otherwise negative with the exception of those mentioned in the HPI and as above.  Physical Exam: Vitals:   03/28/21 1815 03/28/21 1930  BP: 99/66 137/79  Pulse: 78 78  Resp: 18 12  Temp:  98.5 F (36.9 C)  SpO2: 97% 99%    General: Alert, no acute distress Cardiovascular: No pedal edema Respiratory: No cyanosis, no use of accessory musculature GI: No organomegaly, abdomen is soft and non-tender Skin: No lesions in the area of chief complaint Neurologic: Sensation intact distally Psychiatric: Patient is competent for consent with normal mood and  affect Lymphatic: No axillary or cervical lymphadenopathy  MUSCULOSKELETAL: Ortho exam demonstrates right lower extremity with external rotation and shortening.  Palpable DP pulse of bilateral lower extremities.  No calf or tibial shaft tenderness bilaterally.  No knee effusion noted.  No pain with ankle range of motion or tenderness through the bilateral ankle joints.  She has increased pain with logroll of the right hip but no pain with logroll of the left hip.  Localizes all of her pain to the right groin.  No pain with range of motion or palpation to bilateral hands, wrist, elbow, shoulders.  Assessment/Plan: RIGHT HIP FRACTURE Plan for Procedure(s): INTRAMEDULLARY (IM) NAIL INTERTROCHANTRIC  The risks benefits and alternatives were discussed with the patient including but not limited to the risks of nonoperative treatment, versus surgical intervention including infection, bleeding, nerve injury, malunion, nonunion, hardware prominence, hardware failure, need for hardware removal, blood clots, cardiopulmonary complications, morbidity, mortality, among others, and they were willing to proceed.  Predicted outcome is good, although there may be at least a six to nine month expected recovery.  Anticipate initial restricted weightbearing period of 2 to 4 weeks with progression based on radiographs.  She will follow-up with Dr. Marlou Sa in clinic but likely will need discharge to skilled nursing facility after she is medically stable.  She will work with PT in the meantime.  Donella Stade, PA-C 03/28/2021 7:40 PM

## 2021-03-28 NOTE — Consult Note (Signed)
ORTHOPAEDIC CONSULTATION  REQUESTING PHYSICIAN: Clance Boll, MD  Chief Complaint: "my right hip hurts"  HPI: Wendy Watts is a 75 y.o. female who presents with right hip pain. Tripped on rug at home and fell.  was transported via EMS.  Walks with no cane or walker at baseline. No blood thinner medications.  Lives at home with husband.  No history of prior hip surgery but she does report prior right knee arthroplasty..  She does have some baseline dementia.  Past Medical History:  Diagnosis Date   Anxiety    Dementia ([place])    High cholesterol    Vertigo    Past Surgical History:  Procedure Laterality Date   HYSTERECTOMY     TOTAL KNEE ARTHROPLASTY Right    Social History   Socioeconomic History   Marital status: Married    Spouse name: Not on file   Number of children: 2   Years of education: Not on file   Highest education level: High school graduate  Occupational History   Not on file  Tobacco Use   Smoking status: Never   Smokeless tobacco: Never  Substance and Sexual Activity   Alcohol use: Never   Drug use: Never   Sexual activity: Not on file  Other Topics Concern   Not on file  Social History Narrative   LIves at home with husband and son   Social Determinants of Health   Financial Resource Strain: Not on file  Food Insecurity: Not on file  Transportation Needs: Not on file  Physical Activity: Not on file  Stress: Not on file  Social Connections: Not on file   Family History  Problem Relation Age of Onset   Dementia Neg Hx    Alzheimer's disease Neg Hx    - negative except otherwise stated in the family history section Allergies  Allergen Reactions   Allegra [Fexofenadine Hcl] Other (See Comments)    "takes me to another world"   Ativan [Lorazepam] Other (See Comments)    Hallucinations   Azithromycin Nausea And Vomiting   Codeine Hives   Doxycycline Hyclate Nausea Only   Penicillins Hives    Has patient had a PCN reaction causing  immediate rash, facial/tongue/throat swelling, SOB or lightheadedness with hypotension: no Has patient had a PCN reaction causing severe rash involving mucus membranes or skin necrosis: no Has patient had a PCN reaction that required hospitalization: no Has patient had a PCN reaction occurring within the last 10 years: no If all of the above answers are "NO", then may proceed with Cephalosporin use.    Trazodone And Nefazodone Nausea Only   Zyrtec [Cetirizine] Other (See Comments)    "takes me to another world"   Prior to Admission medications   Medication Sig Start Date End Date Taking? Authorizing Provider  busPIRone (BUSPAR) 10 MG tablet Take 10 mg by mouth 2 (two) times daily. 02/11/21  Yes [provider]  donepezil (ARICEPT) 10 MG tablet TAKE 1 TABLET(10 MG) BY MOUTH AT BEDTIME Patient taking differently: Take 10 mg by mouth at bedtime. 02/17/21  Yes Lomax, Amy, NP  fluticasone (FLONASE) 50 MCG/ACT nasal spray Place 2 sprays into both nostrils daily as needed for allergies or rhinitis.   Yes [provider]  memantine (NAMENDA) 10 MG tablet Take 1 tablet (10 mg total) by mouth 2 (two) times daily. 02/16/21  Yes Lomax, Amy, NP  VITAMIN D PO Take 1 tablet by mouth daily.   Yes [provider]  DG Chest 1 View  Result Date: 03/28/2021 CLINICAL DATA:  Fall with RIGHT hip pain. EXAM: CHEST  1 VIEW COMPARISON:  Chest x-ray dated 03/20/2014. FINDINGS: The heart size and mediastinal contours are within normal limits. Both lungs are clear. The visualized skeletal structures are unremarkable. IMPRESSION: No active disease. Electronically Signed   By: Franki Cabot M.D.   On: 03/28/2021 13:52   DG Hip Unilat With Pelvis 2-3 Views Right  Result Date: 03/28/2021 CLINICAL DATA:  Fall with RIGHT hip pain. EXAM: DG HIP (WITH OR WITHOUT PELVIS) 2-3V RIGHT COMPARISON:  None. FINDINGS: Markedly displaced/comminuted fracture of the RIGHT femoral neck, centered at the trochanters,  with 90 degree angulation deformity at the fracture site. Femoral head remains grossly well positioned relative to the acetabulum. Remainder of the osseous pelvis appears intact and normally aligned. IMPRESSION: Markedly displaced/comminuted fracture of the RIGHT femoral neck, intertrochanteric, with 90 degree angulation deformity at the fracture site. Electronically Signed   By: Franki Cabot M.D.   On: 03/28/2021 13:51   - pertinent xrays, CT, MRI studies were reviewed and independently interpreted  Positive ROS: All other systems have been reviewed and were otherwise negative with the exception of those mentioned in the HPI and as above.  Physical Exam: General: Alert, no acute distress Psychiatric: Patient is competent for consent with normal mood and affect Lymphatic: No axillary or cervical lymphadenopathy Cardiovascular: No pedal edema Respiratory: No cyanosis, no use of accessory musculature GI: No organomegaly, abdomen is soft and non-tender    Images:  '@ENCIMAGES'$ @  Labs:  No results found for: HGBA1C, ESRSEDRATE, CRP, LABURIC, REPTSTATUS, GRAMSTAIN, CULT, LABORGA  Lab Results  Component Value Date   ALBUMIN 3.4 (L) 03/28/2021   ALBUMIN 4.1 05/08/2019   ALBUMIN 4.0 09/21/2016    Neurologic: Patient does not have protective sensation bilateral lower extremities.   MUSCULOSKELETAL:   Ortho exam demonstrates right lower extremity with external rotation and shortening.  Palpable DP pulse of bilateral lower extremities.  No calf or tibial shaft tenderness bilaterally.  No knee effusion noted.  No pain with ankle range of motion or tenderness through the bilateral ankle joints.  She has increased pain with logroll of the right hip but no pain with logroll of the left hip.  Localizes all of her pain to the right groin.  No pain with range of motion or palpation to bilateral hands, wrist, elbow, shoulders.  Assessment: Right hip intertrochanteric fracture  Plan: Plan for  intramedullary fixation for right hip intertrochanteric fracture.  Her weightbearing status will be restricted for the initial 2 weeks and then her weightbearing status will be progress at that time based on radiographs obtained when she sees Dr. Marlou Sa in clinic.  Discussed the risks and benefits of the procedure with the patient and her husband who is agreeable to surgery.  Primarily the patient and her husband discussed it with Dr. Marlou Sa.  With her baseline dementia, anticipate need for discharge to skilled nursing facility and then return home after stay at [facility].  She will follow-up with Dr. Marlou Sa in 10 to 14 days after procedure.  Thank you for the consult and the opportunity to see Wendy Watts, [place] [phone] 7:22 PM

## 2021-03-28 NOTE — ED Notes (Signed)
Pure wick in place will give sample when able

## 2021-03-28 NOTE — H&P (Addendum)
History and Physical    Wendy Watts [id number] DOB: 27-Mar-1946 DOA: 03/28/2021  PCP: [name], MD  Patient coming from: home  I have personally briefly reviewed patient's old medical records in [place]  Chief Complaint: right hip pain s/p fall  HPI: Wendy Watts is a 75 y.o. female with medical history significant of Dementia, anxiety  HLD managed by diet, who presents to ed s/p mechanical fall with right leg and groin pain s/p fall.  Patient on evaluation was found to have "Markedly displaced/comminuted fracture of the RIGHT femoral neck, intertrochanteric, with 90 degree angulation deformity at the fracture site." Per husband patient while attempting to turn around got her feet twisted and tripped on a rug.  Patient notes she had significant pain in right hip after fall. She note no head trauma, and was in her normal state of health prior to fall. On ros she denies any fever/chills/ abdominal pain, sob/ chest pain/ cough / dysuria or diarrhea.  She states she is active and had no difficulty with climbing stairs  and has never any any cardiac issues, hx of cva, lung or kidney disease.   ED Course: Afeb, bp 150/73, hr 70, sat 97% on ra    Xray: IMPRESSION: Markedly displaced/comminuted fracture of the RIGHT femoral neck, intertrochanteric, with 90 degree angulation deformity at the fracture site  Covid pending Labs Wbc 15.3, hgb 13.3, plt324 Na:141, K 3.7, glu 158, cr 0.85,  Cxr:NAd Ekg: nsr nonspecific t wave flattening no sig change from prior [id number] , zofran  Review of Systems: As per HPI otherwise 10 point review of systems negative.   Past Medical History:  Diagnosis Date   Anxiety    Dementia ([place])    High cholesterol    Vertigo     Past Surgical History:  Procedure Laterality Date   HYSTERECTOMY     TOTAL KNEE ARTHROPLASTY Right      reports that she has never smoked. She has never used smokeless tobacco. She reports that she does  not drink alcohol and does not use drugs.  Allergies  Allergen Reactions   Allegra [Fexofenadine Hcl] Other (See Comments)    "takes me to another world"   Ativan [Lorazepam] Other (See Comments)    Hallucinations   Azithromycin Nausea And Vomiting   Codeine Hives   Doxycycline Hyclate Nausea Only   Penicillins Hives    Has patient had a PCN reaction causing immediate rash, facial/tongue/throat swelling, SOB or lightheadedness with hypotension: no Has patient had a PCN reaction causing severe rash involving mucus membranes or skin necrosis: no Has patient had a PCN reaction that required hospitalization: no Has patient had a PCN reaction occurring within the last 10 years: no If all of the above answers are "NO", then may proceed with Cephalosporin use.    Trazodone And Nefazodone Nausea Only   Zyrtec [Cetirizine] Other (See Comments)    "takes me to another world"    Family History  Problem Relation Age of Onset   Dementia Neg Hx    Alzheimer's disease Neg Hx     Prior to Admission medications   Medication Sig Start Date End Date Taking? Authorizing Provider  busPIRone (BUSPAR) 10 MG tablet Take 10 mg by mouth 2 (two) times daily. 02/11/21  Yes [provider]  donepezil (ARICEPT) 10 MG tablet TAKE 1 TABLET(10 MG) BY MOUTH AT BEDTIME Patient taking differently: Take 10 mg by mouth at bedtime. 02/17/21  Yes Lomax, Amy, NP  fluticasone (FLONASE) 50 MCG/ACT nasal spray Place 2 sprays into both nostrils daily as needed for allergies or rhinitis.   Yes [provider]  memantine (NAMENDA) 10 MG tablet Take 1 tablet (10 mg total) by mouth 2 (two) times daily. 02/16/21  Yes Lomax, Amy, NP  VITAMIN D PO Take 1 tablet by mouth daily.   Yes [provider]    Physical Exam: Vitals:   03/28/21 1436 03/28/21 1500 03/28/21 1530 03/28/21 1545  BP: 100/74 136/63 (!) 142/74 127/64  Pulse: 65 73 82 81  Resp: '16 12 11 17  '$ Temp:      TempSrc:      SpO2: 99% 99% 99%  98%  Weight:      Height:         Vitals:   03/28/21 1436 03/28/21 1500 03/28/21 1530 03/28/21 1545  BP: 100/74 136/63 (!) 142/74 127/64  Pulse: 65 73 82 81  Resp: '16 12 11 17  '$ Temp:      TempSrc:      SpO2: 99% 99% 99% 98%  Weight:      Height:      Constitutional: NAD, calm, comfortable Eyes: PERRL, lids and conjunctivae normal ENMT: Mucous membranes are dry. Posterior pharynx clear of any exudate or lesions.Normal dentition.  Neck: normal, supple, no masses, no thyromegaly Respiratory: clear to auscultation bilaterally, no wheezing, no crackles. Normal respiratory effort. No accessory muscle use.  Cardiovascular: Regular rate and rhythm, no murmurs / rubs / gallops. No extremity edema. 2+ pedal pulses. No carotid bruits.  Abdomen: no tenderness, no masses palpated. No hepatosplenomegaly. Bowel sounds positive.  Musculoskeletal: no clubbing / cyanosis. Right lower ext angulated and shorter than left, tender palpation lateral/anterior thigh on left.  Skin: no rashes, lesions, ulcers. No induration Neurologic: CN 2-12 grossly intact. Sensation intact,  MAE other than right LE due to pain  Psychiatric: Normal judgment and insight. Alert to place and self. Normal mood.    Labs on Admission: I have personally reviewed following labs and imaging studies  CBC: Recent Labs  Lab 03/28/21 1209  WBC 15.3*  NEUTROABS 13.6*  HGB 13.3  HCT 40.5  MCV 94.6  PLT 0000000   Basic Metabolic Panel: Recent Labs  Lab 03/28/21 1209  NA 141  K 3.7  CL 108  CO2 24  GLUCOSE 158*  BUN 15  CREATININE 0.85  CALCIUM 9.3   GFR: Estimated Creatinine Clearance: 51.5 mL/min (by C-G formula based on SCr of 0.85 mg/dL). Liver Function Tests: Recent Labs  Lab 03/28/21 1209  AST 16  ALT 15  ALKPHOS 66  BILITOT 0.9  PROT 6.3*  ALBUMIN 3.4*   No results for input(s): LIPASE, AMYLASE in the last 168 hours. No results for input(s): AMMONIA in the last 168 hours. Coagulation Profile: No  results for input(s): INR, PROTIME in the last 168 hours. Cardiac Enzymes: No results for input(s): CKTOTAL, CKMB, CKMBINDEX, TROPONINI in the last 168 hours. BNP (last 3 results) No results for input(s): PROBNP in the last 8760 hours. HbA1C: No results for input(s): HGBA1C in the last 72 hours. CBG: No results for input(s): GLUCAP in the last 168 hours. Lipid Profile: No results for input(s): CHOL, HDL, LDLCALC, TRIG, CHOLHDL, LDLDIRECT in the last 72 hours. Thyroid Function Tests: No results for input(s): TSH, T4TOTAL, FREET4, T3FREE, THYROIDAB in the last 72 hours. Anemia Panel: No results for input(s): VITAMINB12, FOLATE, FERRITIN, TIBC, IRON, RETICCTPCT in the last 72 hours. Urine analysis:    Component Value  Date/Time   COLORURINE YELLOW 05/07/2017 1650   APPEARANCEUR CLEAR 05/07/2017 1650   LABSPEC <1.005 (L) 05/07/2017 1650   PHURINE 7.0 05/07/2017 1650   GLUCOSEU NEGATIVE 05/07/2017 1650   HGBUR SMALL (A) 05/07/2017 1650   BILIRUBINUR NEGATIVE 05/07/2017 1650   KETONESUR NEGATIVE 05/07/2017 1650   PROTEINUR NEGATIVE 05/07/2017 1650   UROBILINOGEN 0.2 02/01/2010 1201   NITRITE NEGATIVE 05/07/2017 1650   LEUKOCYTESUR NEGATIVE 05/07/2017 1650    Radiological Exams on Admission: DG Chest 1 View  Result Date: 03/28/2021 CLINICAL DATA:  Fall with RIGHT hip pain. EXAM: CHEST  1 VIEW COMPARISON:  Chest x-ray dated 03/20/2014. FINDINGS: The heart size and mediastinal contours are within normal limits. Both lungs are clear. The visualized skeletal structures are unremarkable. IMPRESSION: No active disease. Electronically Signed   By: Franki Cabot M.D.   On: 03/28/2021 13:52   DG Hip Unilat With Pelvis 2-3 Views Right  Result Date: 03/28/2021 CLINICAL DATA:  Fall with RIGHT hip pain. EXAM: DG HIP (WITH OR WITHOUT PELVIS) 2-3V RIGHT COMPARISON:  None. FINDINGS: Markedly displaced/comminuted fracture of the RIGHT femoral neck, centered at the trochanters, with 90 degree angulation  deformity at the fracture site. Femoral head remains grossly well positioned relative to the acetabulum. Remainder of the osseous pelvis appears intact and normally aligned. IMPRESSION: Markedly displaced/comminuted fracture of the RIGHT femoral neck, intertrochanteric, with 90 degree angulation deformity at the fracture site. Electronically Signed   By: Franki Cabot M.D.   On: 03/28/2021 13:51    EKG: Independently reviewed. See above    Assessment/Plan Right intertrochanteric hip fx  -npo  -admit to med /surg  -supportive pain medication /nausea medication  -ortho consulted Dr Alphonzo Severance with plan for surgery this evening  -PT/OT/Case management consult  -Pre-op clearance: patient has no cardiac or pulmonary history, she as no cardiac symptoms and performs  Daily activities such as ALDS as well as going up stairs w/o difficulty. Patient is patient is low risk for low-mod procedure and is medically cleared for surgery w/o need for further evaluation  Leukocytosis -no fever  -cxr negative -ua pending  -monitor trend  -most likely stress response   Dementia -resume aricept, nameda    Anxiety -resume once med  rec completed    HLD -diet controlled per husband     DVT prophylaxis: scd left/per ortho  Code Status: FULL Family Communication:  discussed with husband at bedside  Disposition Plan:patient  expected to be admitted greater than 2 midnights  Consults called: Alphonzo Severance MD Admission status: inpt   Clance Boll MD [facility] If 7PM-7AM, please contact night-coverage [email or web address] Password [facility]  03/28/2021, 4:15 PM

## 2021-03-28 NOTE — Brief Op Note (Signed)
   03/28/2021  9:20 PM  PATIENT:  Wendy Watts  75 y.o. female  PRE-OPERATIVE DIAGNOSIS:  RIGHT HIP FRACTURE  POST-OPERATIVE DIAGNOSIS:  RIGHT HIP FRACTURE  PROCEDURE:  Procedure(s): INTRAMEDULLARY (IM) NAIL INTERTROCHANTRIC  SURGEON:  Surgeon(s): Marlou Sa, Tonna Corner, MD  ASSISTANT: magnant pa  ANESTHESIA:   spinal  EBL: 50 ml    Total I/O In: 750 [I.V.:500; IV Piggyback:250] Out: 50 [Blood:50]  BLOOD ADMINISTERED: none  DRAINS: none   LOCAL MEDICATIONS USED:  marcaine mso4 clonidine  SPECIMEN:  No Specimen  COUNTS:  YES  TOURNIQUET:  * No tourniquets in log *  DICTATION: .Other Dictation: Dictation Number done  PLAN OF CARE: Admit to inpatient   PATIENT DISPOSITION:  PACU - hemodynamically stable

## 2021-03-28 NOTE — Progress Notes (Signed)
Radiographs reviewed Right hip intertrochanteric fracture present Plan medical admission with surgery tonight I will see the patient around 630 or 7

## 2021-03-28 NOTE — Anesthesia Preprocedure Evaluation (Addendum)
Anesthesia Evaluation  Patient identified by MRN, date of birth, ID band Patient awake and Patient confused    Reviewed: Allergy & Precautions, NPO status , Patient's Chart, lab work & pertinent test results  Airway Mallampati: II  TM Distance: >3 FB Neck ROM: Full    Dental  (+) Teeth Intact, Dental Advisory Given   Pulmonary neg pulmonary ROS,    Pulmonary exam normal breath sounds clear to auscultation       Cardiovascular negative cardio ROS Normal cardiovascular exam Rhythm:Regular Rate:Normal     Neuro/Psych PSYCHIATRIC DISORDERS Anxiety Dementia negative neurological ROS     GI/Hepatic negative GI ROS, Neg liver ROS,   Endo/Other  negative endocrine ROS  Renal/GU negative Renal ROS     Musculoskeletal RIGHT HIP FRACTURE   Abdominal   Peds  Hematology negative hematology ROS (+) Plt 324k   Anesthesia Other Findings Day of surgery medications reviewed with the patient.  Reproductive/Obstetrics                            Anesthesia Physical Anesthesia Plan  ASA: 3 and emergent  Anesthesia Plan: Spinal   Post-op Pain Management:    Induction:   PONV Risk Score and Plan: 2 and Propofol infusion and Ondansetron  Airway Management Planned: Natural Airway and Nasal Cannula  Additional Equipment:   Intra-op Plan:   Post-operative Plan:   Informed Consent: I have reviewed the patients History and Physical, chart, labs and discussed the procedure including the risks, benefits and alternatives for the proposed anesthesia with the patient or authorized representative who has indicated his/her understanding and acceptance.     Dental advisory given and Consent reviewed with POA  Plan Discussed with: CRNA  Anesthesia Plan Comments: (Consent with husband )      Anesthesia Quick Evaluation

## 2021-03-28 NOTE — Progress Notes (Signed)
Verify admition orders  with [facility]. No need tele bed .

## 2021-03-29 ENCOUNTER — Other Ambulatory Visit: Payer: Self-pay

## 2021-03-29 LAB — BASIC METABOLIC PANEL
Anion gap: 5 (ref 5–15)
BUN: 14 mg/dL (ref 8–23)
CO2: 25 mmol/L (ref 22–32)
Calcium: 9.2 mg/dL (ref 8.9–10.3)
Chloride: 110 mmol/L (ref 98–111)
Creatinine, Ser: 0.88 mg/dL (ref 0.44–1.00)
GFR, Estimated: >60 mL/min (ref >60)
Glucose, Bld: 182 mg/dL — ABNORMAL HIGH (ref 70–99)
Potassium: 3.7 mmol/L (ref 3.5–5.1)
Sodium: 140 mmol/L (ref 135–145)

## 2021-03-29 LAB — CBC
HCT: 34 % — ABNORMAL LOW (ref 36.0–46.0)
Hemoglobin: 10.9 g/dL — ABNORMAL LOW (ref 12.0–15.0)
MCH: 31.1 pg (ref 26.0–34.0)
MCHC: 32.1 g/dL (ref 30.0–36.0)
MCV: 96.9 fL (ref 80.0–100.0)
Platelets: 277 10*3/uL (ref 150–400)
RBC: 3.51 MIL/uL — ABNORMAL LOW (ref 3.87–5.11)
RDW: 13.4 % (ref 11.5–15.5)
WBC: 15.2 10*3/uL — ABNORMAL HIGH (ref 4.0–10.5)
nRBC: 0 % (ref 0.0–0.2)

## 2021-03-29 LAB — URINALYSIS, COMPLETE (UACMP) WITH MICROSCOPIC
Bacteria, UA: NONE SEEN
Bilirubin Urine: NEGATIVE
Glucose, UA: 50 mg/dL — AB
Hgb urine dipstick: NEGATIVE
Ketones, ur: NEGATIVE mg/dL
Leukocytes,Ua: NEGATIVE
Nitrite: NEGATIVE
Protein, ur: NEGATIVE mg/dL
Specific Gravity, Urine: 1.015 (ref 1.005–1.030)
pH: 5 (ref 5.0–8.0)

## 2021-03-29 LAB — HEMOGLOBIN A1C
Hgb A1c MFr Bld: 5.4 % (ref 4.8–5.6)
Mean Plasma Glucose: 108.28 mg/dL

## 2021-03-29 LAB — SARS CORONAVIRUS 2 (TAT 6-24 HRS): SARS Coronavirus 2: NEGATIVE

## 2021-03-29 LAB — VITAMIN D 25 HYDROXY (VIT D DEFICIENCY, FRACTURES): Vit D, 25-Hydroxy: 53.48 ng/mL (ref 30–100)

## 2021-03-29 MED ORDER — PHENOL 1.4 % MT LIQD: 1.0000 | OROMUCOSAL | Status: DC | PRN

## 2021-03-29 MED ORDER — ASPIRIN EC 81 MG PO TBEC
81.0000 mg | DELAYED_RELEASE_TABLET | Freq: Two times a day (BID) | ORAL | Status: DC
  Administered 2021-03-29: 81 mg via ORAL
  Filled 2021-03-29 (×3): qty 1

## 2021-03-29 MED ORDER — CEFAZOLIN SODIUM-DEXTROSE 2-4 GM/100ML-% IV SOLN
INTRAVENOUS | Status: AC
  Filled 2021-03-29: qty 100

## 2021-03-29 MED ORDER — TRANEXAMIC ACID-NACL 1000-0.7 MG/100ML-% IV SOLN
INTRAVENOUS | Status: AC
  Filled 2021-03-29: qty 100

## 2021-03-29 MED ORDER — MORPHINE SULFATE (PF) 4 MG/ML IV SOLN
INTRAVENOUS | Status: AC
  Filled 2021-03-29: qty 1

## 2021-03-29 MED ORDER — ACETAMINOPHEN 500 MG PO TABS
500.0000 mg | ORAL_TABLET | Freq: Four times a day (QID) | ORAL | Status: AC
  Administered 2021-03-29 (×4): 500 mg via ORAL
  Filled 2021-03-29 (×2): qty 1

## 2021-03-29 MED ORDER — ONDANSETRON HCL 4 MG PO TABS
4.0000 mg | ORAL_TABLET | Freq: Four times a day (QID) | ORAL | Status: DC | PRN
  Filled 2021-03-29: qty 1

## 2021-03-29 MED ORDER — ACETAMINOPHEN 500 MG PO TABS
ORAL_TABLET | ORAL | Status: AC
  Filled 2021-03-29: qty 2

## 2021-03-29 MED ORDER — METHOCARBAMOL 500 MG IVPB - SIMPLE MED
INTRAVENOUS | Status: AC
  Filled 2021-03-29: qty 50

## 2021-03-29 MED ORDER — METOCLOPRAMIDE HCL 5 MG PO TABS
5.0000 mg | ORAL_TABLET | Freq: Three times a day (TID) | ORAL | Status: DC | PRN
  Filled 2021-03-29: qty 2

## 2021-03-29 MED ORDER — CEFAZOLIN SODIUM-DEXTROSE 2-4 GM/100ML-% IV SOLN
2.0000 g | Freq: Three times a day (TID) | INTRAVENOUS | Status: AC
  Administered 2021-03-29 (×2): 2 g via INTRAVENOUS
  Filled 2021-03-29: qty 100

## 2021-03-29 MED ORDER — DOCUSATE SODIUM 100 MG PO CAPS
100.0000 mg | ORAL_CAPSULE | Freq: Two times a day (BID) | ORAL | Status: DC
  Administered 2021-03-29 – 2021-03-30 (×4): 100 mg via ORAL
  Filled 2021-03-29 (×4): qty 1

## 2021-03-29 MED ORDER — METOCLOPRAMIDE HCL 5 MG/ML IJ SOLN: 5.0000 mg | Freq: Three times a day (TID) | INTRAMUSCULAR | Status: DC | PRN

## 2021-03-29 MED ORDER — MORPHINE SULFATE (PF) 4 MG/ML IV SOLN
0.5000 mg | INTRAVENOUS | Status: DC | PRN
  Administered 2021-03-29 (×2): 1 mg via INTRAVENOUS

## 2021-03-29 MED ORDER — ACETAMINOPHEN 325 MG PO TABS
325.0000 mg | ORAL_TABLET | Freq: Four times a day (QID) | ORAL | Status: DC | PRN
Start: 2021-03-30 — End: 2021-03-31
  Administered 2021-03-30: 650 mg via ORAL
  Filled 2021-03-29: qty 2

## 2021-03-29 MED ORDER — BUSPIRONE HCL 10 MG PO TABS
10.0000 mg | ORAL_TABLET | Freq: Two times a day (BID) | ORAL | Status: DC
  Administered 2021-03-29 – 2021-03-30 (×3): 10 mg via ORAL
  Filled 2021-03-29 (×3): qty 1

## 2021-03-29 MED ORDER — DONEPEZIL HCL 10 MG PO TABS
10.0000 mg | ORAL_TABLET | Freq: Every day | ORAL | Status: DC
  Administered 2021-03-29 – 2021-03-30 (×2): 10 mg via ORAL
  Filled 2021-03-29 (×2): qty 1

## 2021-03-29 MED ORDER — MEMANTINE HCL 10 MG PO TABS
10.0000 mg | ORAL_TABLET | Freq: Two times a day (BID) | ORAL | Status: DC
  Administered 2021-03-29 – 2021-03-30 (×3): 10 mg via ORAL
  Filled 2021-03-29 (×3): qty 1

## 2021-03-29 MED ORDER — NAPROXEN 250 MG PO TABS
250.0000 mg | ORAL_TABLET | Freq: Two times a day (BID) | ORAL | Status: DC
  Administered 2021-03-29 – 2021-03-30 (×3): 250 mg via ORAL
  Filled 2021-03-29 (×5): qty 1

## 2021-03-29 MED ORDER — MENTHOL 3 MG MT LOZG: 1.0000 | LOZENGE | OROMUCOSAL | Status: DC | PRN

## 2021-03-29 MED ORDER — ONDANSETRON HCL 4 MG/2ML IJ SOLN
4.0000 mg | Freq: Four times a day (QID) | INTRAMUSCULAR | Status: DC | PRN
  Administered 2021-03-29: 4 mg via INTRAVENOUS
  Filled 2021-03-29: qty 2

## 2021-03-29 NOTE — Plan of Care (Signed)
  Problem: Nutrition: Goal: Adequate nutrition will be maintained Outcome: Progressing   

## 2021-03-29 NOTE — Evaluation (Signed)
Physical Therapy Evaluation Patient Details Name: Wendy Watts MRN: [id number] DOB: [birth year] Today's Date: 03/29/2021   History of Present Illness  Pt s/p fall with R hip fx and now s/p IM nailing.  Pt with hx of dementia, vertigo, R TKR and HOH  Clinical Impression  Pt admitted as above and presenting with functional mobility limitations 2* decreased R LE strength/ROM, post-op pain, NWB on R LE, and dementia related cognitive deficits.  Pt is currently requiring significant assist of 2 for performance of all mobility tasks and would benefit from follow up rehab at SNF level to maximize IND and safety prior to return home.    Follow Up Recommendations SNF (unless family decides they can provide level of assist required)    Equipment Recommendations  None recommended by PT    Recommendations for Other Services       Precautions / Restrictions Precautions Precautions: Fall Restrictions Weight Bearing Restrictions: Yes RLE Weight Bearing: Non weight bearing Other Position/Activity Restrictions: can be TDWB for transfers only      Mobility  Bed Mobility Overal bed mobility: Needs Assistance Bed Mobility: Supine to Sit;Sit to Supine     Supine to sit: Total assist Sit to supine: Total assist   General bed mobility comments: Pt ltd by pain/anxiety    Transfers                 General transfer comment: NT 2* to pt pain and anxiety with move to EOB sitting.  Pt balanced with supervision only x 10 min  Ambulation/Gait                Stairs            Wheelchair Mobility    Modified Rankin (Stroke Patients Only)       Balance Overall balance assessment: Needs assistance Sitting-balance support: Feet supported;Single extremity supported Sitting balance-Leahy Scale: Fair                                       Pertinent Vitals/Pain Pain Assessment: Faces Faces Pain Scale: Hurts whole lot Pain Location: R hip with movement Pain  Descriptors / Indicators: Grimacing;Guarding;Moaning Pain Intervention(s): Limited activity within patient's tolerance;Monitored during session;Premedicated before session;RN gave pain meds during session;Patient requesting pain meds-RN notified;Ice applied    Home Living Family/patient expects to be discharged to:: Unsure                      Prior Function Level of Independence: Independent               Hand Dominance        Extremity/Trunk Assessment   Upper Extremity Assessment Upper Extremity Assessment: Overall WFL for tasks assessed    Lower Extremity Assessment Lower Extremity Assessment: RLE deficits/detail RLE: Unable to fully assess due to pain       Communication   Communication: HOH  Cognition Arousal/Alertness: Awake/alert Behavior During Therapy: Impulsive;Anxious Overall Cognitive Status: History of cognitive impairments - at baseline                                 General Comments: Pt with hx of dementia      General Comments      Exercises     Assessment/Plan    PT Assessment Patient needs continued PT services  PT Problem List Decreased strength;Decreased range of motion;Decreased activity tolerance;Decreased balance;Decreased mobility;Decreased cognition;Decreased knowledge of use of DME;Pain       PT Treatment Interventions DME instruction;Gait training;Functional mobility training;Therapeutic activities;Therapeutic exercise;Patient/family education    PT Goals (Current goals can be found in the Care Plan section)  Acute Rehab PT Goals Patient Stated Goal: rest PT Goal Formulation: Patient unable to participate in goal setting Time For Goal Achievement: 04/12/21 Potential to Achieve Goals: Fair    Frequency Min 3X/week   Barriers to discharge        Co-evaluation               AM-PAC PT "6 Clicks" Mobility  Outcome Measure Help needed turning from your back to your side while in a flat bed  without using bedrails?: Total Help needed moving from lying on your back to sitting on the side of a flat bed without using bedrails?: Total Help needed moving to and from a bed to a chair (including a wheelchair)?: Total Help needed standing up from a chair using your arms (e.g., wheelchair or bedside chair)?: Total Help needed to walk in hospital room?: Total Help needed climbing 3-5 steps with a railing? : Total 6 Click Score: 6    End of Session   Activity Tolerance: Patient limited by pain;Other (comment) (anxiety) Patient left: in bed;with call bell/phone within reach;with bed alarm set;with family/visitor present   PT Visit Diagnosis: Difficulty in walking, not elsewhere classified (R26.2);History of falling (Z91.81);Pain Pain - Right/Left: Right Pain - part of body: Hip    Time: [id number] PT Time Calculation (min) (ACUTE ONLY): 26 min   Charges:   PT Evaluation $PT Eval Low Complexity: 1 Low PT Treatments $Therapeutic Activity: 8-22 mins        Debe Coder PT Acute Rehabilitation Services Pager [phone] Office [phone]   [facility] 03/29/2021, 12:27 PM

## 2021-03-29 NOTE — NC FL2 (Signed)
[place] LEVEL OF CARE SCREENING TOOL     IDENTIFICATION  Patient Name: Wendy Watts Birthdate: Apr 23, 1946 Sex: female Admission Date (Current Location): 03/28/2021  [facility] and [place] Number:  Herbalist and Address:  [facility],  [address] [place], [place]      Provider Number: [id number]  Attending Physician Name and Address:  Clance Boll, MD  Relative Name and Phone Number:  Triston Blakey (spouse) Ph: [phone]    Current Level of Care: Hospital Recommended Level of Care: [place] Prior Approval Number:    Date Approved/Denied:   PASRR Number: [id number]  Discharge Plan: SNF    Current Diagnoses: Patient Active Problem List   Diagnosis Date Noted   Hip fx ([place]) 03/28/2021   Dementia without behavioral disturbance ([place]) 05/08/2019    Orientation RESPIRATION BLADDER Height & Weight     Self, Place  O2 (2L/min) Continent Weight: 142 lb (64.4 kg) Height:  '5\' 5"'$  (165.1 cm)  BEHAVIORAL SYMPTOMS/MOOD NEUROLOGICAL BOWEL NUTRITION STATUS      Continent Diet (Regular)  AMBULATORY STATUS COMMUNICATION OF NEEDS Skin   Extensive Assist Verbally Surgical wounds                       Personal Care Assistance Level of Assistance  Bathing, Feeding, Dressing Bathing Assistance: Maximum assistance Feeding assistance: Limited assistance Dressing Assistance: Maximum assistance     Functional Limitations Info  Sight, Hearing, Speech Sight Info: Adequate Hearing Info: Adequate Speech Info: Adequate    SPECIAL CARE FACTORS FREQUENCY  PT (By licensed PT), OT (By licensed OT)     PT Frequency: 5x's/week OT Frequency: 5x's/week            Contractures Contractures Info: Not present    Additional Factors Info  Code Status, Allergies Code Status Info: Full Allergies Info: Allegra (Fexofenadine Hcl), Ativan (Lorazepam), Azithromycin, Codeine, Doxycycline Hyclate, Penicillins, Trazodone And  Nefazodone, Zyrtec (Cetirizine)           Current Medications (03/29/2021):  This is the current hospital active medication list Current Facility-Administered Medications  Medication Dose Route Frequency Provider Last Rate Last Admin   0.9 %  sodium chloride infusion   Intravenous Continuous Clance Boll, MD 75 mL/hr at 03/29/21 0905 New Bag at 03/29/21 0905   [START ON 03/30/2021] acetaminophen (TYLENOL) tablet 325-650 mg  325-650 mg Oral Q6H PRN Magnant, Charles L, PA-C       acetaminophen (TYLENOL) tablet 500 mg  500 mg Oral Q6H Magnant, Charles L, PA-C   500 mg at 03/29/21 1112   docusate sodium (COLACE) capsule 100 mg  100 mg Oral BID Myles Rosenthal A, MD       docusate sodium (COLACE) capsule 100 mg  100 mg Oral BID Magnant, Charles L, PA-C   100 mg at 03/29/21 0906   enoxaparin (LOVENOX) injection 40 mg  40 mg Subcutaneous Q24H Myles Rosenthal A, MD       HYDROmorphone (DILAUDID) injection 0.5 mg  0.5 mg Intravenous Q2H PRN Myles Rosenthal A, MD   0.5 mg at 03/29/21 1157   menthol-cetylpyridinium (CEPACOL) lozenge 3 mg  1 lozenge Oral PRN Magnant, Charles L, PA-C       Or   phenol (CHLORASEPTIC) mouth spray 1 spray  1 spray Mouth/Throat PRN Magnant, Charles L, PA-C       methocarbamol (ROBAXIN) tablet 500 mg  500 mg Oral Q8H PRN Clance Boll, MD  Or   methocarbamol (ROBAXIN) 500 mg in dextrose 5 % 50 mL IVPB  500 mg Intravenous Q6H PRN Clance Boll, MD       methocarbamol (ROBAXIN) tablet 500 mg  500 mg Oral Q6H PRN Magnant, Charles L, PA-C   500 mg at 03/29/21 1345   Or   methocarbamol (ROBAXIN) 500 mg in dextrose 5 % 50 mL IVPB  500 mg Intravenous Q6H PRN Magnant, Charles L, PA-C   Stopped at 03/29/21 0110   metoCLOPramide (REGLAN) tablet 5-10 mg  5-10 mg Oral Q8H PRN Magnant, Charles L, PA-C       Or   metoCLOPramide (REGLAN) injection 5-10 mg  5-10 mg Intravenous Q8H PRN Magnant, Charles L, PA-C       morphine 4 MG/ML injection 0.52-1 mg  0.52-1  mg Intravenous Q2H PRN Magnant, Charles L, PA-C   1 mg at 03/29/21 [id number]   morphine 4 MG/ML injection            naproxen (NAPROSYN) tablet 250 mg  250 mg Oral BID WC Magnant, Charles L, PA-C       ondansetron (ZOFRAN) tablet 4 mg  4 mg Oral Q6H PRN Magnant, Charles L, PA-C       Or   ondansetron (ZOFRAN) injection 4 mg  4 mg Intravenous Q6H PRN Magnant, Charles L, PA-C   4 mg at 03/29/21 1112   polyethylene glycol (MIRALAX / GLYCOLAX) packet 17 g  17 g Oral Daily PRN Clance Boll, MD       traMADol Veatrice Bourbon) tablet 50 mg  50 mg Oral Q6H PRN Clance Boll, MD   50 mg at 03/29/21 1345     Discharge Medications: Please see discharge summary for a list of discharge medications.  Relevant Imaging Results:  Relevant Lab Results:   Additional Information SSN: [id number]  Sherie Don, LCSW

## 2021-03-29 NOTE — TOC Initial Note (Signed)
Transition of Care [facility]) - Initial/Assessment Note   Patient Details  Name: Wendy Watts MRN: [id number] Date of Birth: Nov 07, 1945  Transition of Care [facility]) CM/SW Contact:    Wendy Don, LCSW Phone Number: 03/29/2021, 1:51 PM  Clinical Narrative: PT evaluation recommended SNF. CSW spoke with patient's husband, Wendy Watts, as patient has dementia and is oriented x2. CSW discussed PT recommendations for SNF and husband is agreeable. Patient is vaccinated, but not boosted for COVID.  FL2 done; PASRR received. Initial referral faxed out. TOC awaiting bed offers.  Expected Discharge Plan: Skilled Nursing Facility Barriers to Discharge: Continued Medical Work up, SNF Pending bed offer  Patient Goals and CMS Choice Patient states their goals for this hospitalization and ongoing recovery are:: Go to short-term rehab before returning home CMS Medicare.gov Compare Post Acute Care list provided to:: Patient Represenative (must comment) Choice offered to / list presented to : Spouse  Expected Discharge Plan and Services Expected Discharge Plan: [place] In-house Referral: Clinical Social Work Post Acute Care Choice: [place] Living arrangements for the past 2 months: Single Family Home              DME Arranged: N/A DME Agency: NA  Prior Living Arrangements/Services Living arrangements for the past 2 months: Single Family Home Lives with:: Spouse Patient language and need for interpreter reviewed:: Yes Do you feel safe going back to the place where you live?: Yes      Need for Family Participation in Patient Care: Yes (Comment) (Patient has dementia) Care giver support system in place?: Yes (comment) Criminal Activity/Legal Involvement Pertinent to Current Situation/Hospitalization: No - Comment as needed  Activities of Daily Living ADL Screening (condition at time of admission) Patient's cognitive ability adequate to safely complete daily activities?:  No Is the patient deaf or have difficulty hearing?: Yes Does the patient have difficulty seeing, even when wearing glasses/contacts?: No Does the patient have difficulty concentrating, remembering, or making decisions?: No Patient able to express need for assistance with ADLs?: Yes Does the patient have difficulty dressing or bathing?: Yes Independently performs ADLs?: No Dressing (OT): Needs assistance Is this a change from baseline?: Change from baseline, expected to last >3 days (Pt just had surgery) Grooming: Independent Feeding: Independent Bathing: Needs assistance (Pt just had surgery) Is this a change from baseline?: Change from baseline, expected to last >3 days Toileting: Needs assistance (Pt just had surgery) Is this a change from baseline?: Change from baseline, expected to last >3days In/Out Bed: Needs assistance (Pt just had surgery) Is this a change from baseline?: Change from baseline, expected to last >3 days  Permission Sought/Granted Permission sought to share information with : Facility Art therapist granted to share information with : Yes, Verbal Permission Granted Permission granted to share info w AGENCY: SNFs  Emotional Assessment Appearance:: Appears stated age Attitude/Demeanor/Rapport: Unable to Assess Affect (typically observed): Unable to Assess Orientation: : Oriented to Self, Oriented to Place Alcohol / Substance Use: Not Applicable Psych Involvement: No (comment)  Admission diagnosis:  Fracture, femur ([place]) [S72.90XA] Hip fx ([place]) [S72.009A] Fall [W19.XXXA] Fall, initial encounter [id number]] Closed fracture of right hip, initial encounter [facility]) [S72.001A] Patient Active Problem List   Diagnosis Date Noted   Hip fx ([place]) 03/28/2021   Dementia without behavioral disturbance ([place]) 05/08/2019   PCP:  [name], MD Pharmacy:   [facility] [id number] - [place], [place]  [address] [place] [address] Phone: [phone] Fax: [phone]  Readmission Risk Interventions No flowsheet data found.

## 2021-03-29 NOTE — Progress Notes (Signed)
OT Cancellation Note  Patient Details Name: CODY SHAVE MRN: [id number] DOB: [birth year]   Cancelled Treatment:    Reason Eval/Treat Not Completed: Patient at procedure or test/ unavailable patient is off unit for surgery. Will check back tomorrow.  Jackelyn Poling OTR/L, [place] Acute Rehabilitation Department Office# [phone] Pager# [phone]   [place] 03/29/2021, 7:37 AM

## 2021-03-29 NOTE — Progress Notes (Signed)
PROGRESS NOTE    Wendy Watts  [id number] DOB: Jan 02, 1946 DOA: 03/28/2021 PCP: [name], MD    Brief Narrative:  Wendy Watts is a 75 year old female with past medical history significant for dementia, anxiety, HLD who presented to [facility] ED on 03/28/2021 from home following mechanical fall with subsequent right lower extremity and groin pain.  In the ED, patient is afebrile, BP 150/73, HR 70, SPO2 97% on room air.  Sodium 141, potassium 3.7, chloride 108, CO2 24, glucose 158, BUN 15, creatinine 0.85, AST 16, ALT 15, total bilirubin 0.9.  WBC 15.3, hemoglobin 13.3, platelets 324.  Covid-19 PCR negative.  Influenza A/B PCR negative.  Chest x-ray negative for acute cardiopulmonary disease process.  Right hip x-ray with markedly displaced/comminuted fracture right femoral neck, intertrochanteric with 90 degree angulation deformity at the fracture site.  Orthopedics was consulted.  TRH consulted for further evaluation management of right intertrochanteric hip fracture.   Assessment & Plan:   Active Problems:   Hip fx ([place])   Right displaced/comminuted/angulated intertrochanteric hip fracture, POA Patient presenting from home following mechanical fall with subsequent acute pain and inability to ambulate.  On arrival to the ED, x-rays notable for markedly displaced/comminuted/angulated intertrochanteric right hip fracture.  Orthopedics, Dr. Marlou Sa was consulted and patient underwent ORIF on 8/14 2022. --Orthopedics following, appreciate assistance --Nonweightbearing RLE, okay for touchdown weightbearing RLE for transfers only --Postoperative DVT prophylaxis with Lovenox per orthopedics --Robaxin 500 mg every 6 hours as needed muscle spasms --Tramadol 50 mg every 6 hours as needed moderate pain --Morphine IV as needed severe breakthrough pain --PT/OT recommending SNF --TOC for placement  Dementia --Continue Aricept and Namenda  Anxiety: Continue BuSpar 10 mg p.o. twice  daily   DVT prophylaxis: enoxaparin (LOVENOX) injection 40 mg Start: 03/29/21 1800 SCDs Start: 03/28/21 2158 Place TED hose Start: 03/28/21 2158 SCDs Start: 03/28/21 1632   Code Status: Full Code Family Communication: No family present at bedside this morning.  Disposition Plan:  Level of care: Med-Surg Status is: Inpatient  Remains inpatient appropriate because:Unsafe d/c plan  Dispo: The patient is from: Home              Anticipated d/c is to: SNF              Patient currently is medically stable to d/c.   Difficult to place patient No   Consultants:  Orthopedics, Dr. Marlou Sa  Procedures:  ORIF, Dr. Marlou Sa 03/28/2021  Antimicrobials:  Perioperative vancomycin   Subjective: Patient seen examined at bedside, resting comfortably.  Pleasantly confused.  Continues in PACU holding area awaiting for bed.  No family present.  No specific complaints.  States pain is controlled.  Denies headache, no chest pain, no palpitations, no shortness of breath, no abdominal pain.  No acute events overnight per nursing staff.  Objective: Vitals:   03/29/21 0754 03/29/21 0854 03/29/21 1100 03/29/21 1443  BP: (!) 107/49 (!) 101/55 120/79 (!) 102/56  Pulse: 76 70 73 78  Resp: '14 12 15 15  '$ Temp:  98.4 F (36.9 C) 98.1 F (36.7 C) 98.2 F (36.8 C)  TempSrc:  Oral Oral   SpO2: 95% 100% 100% 100%  Weight:      Height:        Intake/Output Summary (Last 24 hours) at 03/29/2021 1557 Last data filed at 03/29/2021 1530 Gross per 24 hour  Intake 2340 ml  Output 1325 ml  Net 1015 ml   Filed Weights   03/28/21 1126  Weight:  64.4 kg    Examination:  General exam: Appears calm and comfortable, pleasantly confused Respiratory system: Clear to auscultation. Respiratory effort normal.  On 2 L nasal cannula with SPO2 100% at rest Cardiovascular system: S1 & S2 heard, RRR. No JVD, murmurs, rubs, gallops or clicks. No pedal edema. Gastrointestinal system: Abdomen is nondistended, soft and  nontender. No organomegaly or masses felt. Normal bowel sounds heard. Central nervous system: Alert. No focal neurological deficits. Extremities: Symmetric 5 x 5 power. Skin: No rashes, lesions or ulcers, surgical dressing noted, clean/dry/intact Psychiatry: Judgement and insight appear normal. Mood & affect appropriate.     Data Reviewed: I have personally reviewed following labs and imaging studies  CBC: Recent Labs  Lab 03/28/21 1209 03/29/21 0137  WBC 15.3* 15.2*  NEUTROABS 13.6*  --   HGB 13.3 10.9*  HCT 40.5 34.0*  MCV 94.6 96.9  PLT 324 [id number]   Basic Metabolic Panel: Recent Labs  Lab 03/28/21 1209 03/29/21 0137  NA 141 140  K 3.7 3.7  CL 108 110  CO2 24 25  GLUCOSE 158* 182*  BUN 15 14  CREATININE 0.85 0.88  CALCIUM 9.3 9.2   GFR: Estimated Creatinine Clearance: 49.7 mL/min (by C-G formula based on SCr of 0.88 mg/dL). Liver Function Tests: Recent Labs  Lab 03/28/21 1209  AST 16  ALT 15  ALKPHOS 66  BILITOT 0.9  PROT 6.3*  ALBUMIN 3.4*   No results for input(s): LIPASE, AMYLASE in the last 168 hours. No results for input(s): AMMONIA in the last 168 hours. Coagulation Profile: No results for input(s): INR, PROTIME in the last 168 hours. Cardiac Enzymes: No results for input(s): CKTOTAL, CKMB, CKMBINDEX, TROPONINI in the last 168 hours. BNP (last 3 results) No results for input(s): PROBNP in the last 8760 hours. HbA1C: Recent Labs    03/29/21 0137  HGBA1C 5.4   CBG: No results for input(s): GLUCAP in the last 168 hours. Lipid Profile: No results for input(s): CHOL, HDL, LDLCALC, TRIG, CHOLHDL, LDLDIRECT in the last 72 hours. Thyroid Function Tests: Recent Labs    03/28/21 1823  TSH 0.562   Anemia Panel: No results for input(s): VITAMINB12, FOLATE, FERRITIN, TIBC, IRON, RETICCTPCT in the last 72 hours. Sepsis Labs: No results for input(s): PROCALCITON, LATICACIDVEN in the last 168 hours.  Recent Results (from the past 240 hour(s))  SARS  CORONAVIRUS 2 (TAT 6-24 HRS) Nasopharyngeal Nasopharyngeal Swab     Status: None   Collection Time: 03/28/21  1:01 PM   Specimen: Nasopharyngeal Swab  Result Value Ref Range Status   SARS Coronavirus 2 NEGATIVE NEGATIVE Final    Comment: (NOTE) SARS-CoV-2 target nucleic acids are NOT DETECTED.  The SARS-CoV-2 RNA is generally detectable in upper and lower respiratory specimens during the acute phase of infection. Negative results do not preclude SARS-CoV-2 infection, do not rule out co-infections with other pathogens, and should not be used as the sole basis for treatment or other patient management decisions. Negative results must be combined with clinical observations, patient history, and epidemiological information. The expected result is Negative.  Fact Sheet for Patients: SugarRoll.be  Fact Sheet for Healthcare Providers: https://www.woods-mathews.com/  This test is not yet approved or cleared by the [place] FDA and  has been authorized for detection and/or diagnosis of SARS-CoV-2 by FDA under an Emergency Use Authorization (EUA). This EUA will remain  in effect (meaning this test can be used) for the duration of the COVID-19 declaration under Se ction 564(b)(1) of the Act, 21  U.S.C. section 360bbb-3(b)(1), unless the authorization is terminated or revoked sooner.  Performed at [facility], [place] [address], [place], [place] [address]   Resp Panel by RT-PCR (Flu A&B, Covid) Nasopharyngeal Swab     Status: None   Collection Time: 03/28/21  6:26 PM   Specimen: Nasopharyngeal Swab; Nasopharyngeal(NP) swabs in vial transport medium  Result Value Ref Range Status   SARS Coronavirus 2 by RT PCR NEGATIVE NEGATIVE Final    Comment: (NOTE) SARS-CoV-2 target nucleic acids are NOT DETECTED.  The SARS-CoV-2 RNA is generally detectable in upper respiratory specimens during the acute phase of infection. The lowest concentration of  SARS-CoV-2 viral copies this assay can detect is 138 copies/mL. A negative result does not preclude SARS-Cov-2 infection and should not be used as the sole basis for treatment or other patient management decisions. A negative result may occur with  improper specimen collection/handling, submission of specimen other than nasopharyngeal swab, presence of viral mutation(s) within the areas targeted by this assay, and inadequate number of viral copies(<138 copies/mL). A negative result must be combined with clinical observations, patient history, and epidemiological information. The expected result is Negative.  Fact Sheet for Patients:  EntrepreneurPulse.com.au  Fact Sheet for Healthcare Providers:  IncredibleEmployment.be  This test is no t yet approved or cleared by the [place] FDA and  has been authorized for detection and/or diagnosis of SARS-CoV-2 by FDA under an Emergency Use Authorization (EUA). This EUA will remain  in effect (meaning this test can be used) for the duration of the COVID-19 declaration under Section 564(b)(1) of the Act, 21 U.S.C.section 360bbb-3(b)(1), unless the authorization is terminated  or revoked sooner.       Influenza A by PCR NEGATIVE NEGATIVE Final   Influenza B by PCR NEGATIVE NEGATIVE Final    Comment: (NOTE) The Xpert Xpress SARS-CoV-2/FLU/RSV plus assay is intended as an aid in the diagnosis of influenza from Nasopharyngeal swab specimens and should not be used as a sole basis for treatment. Nasal washings and aspirates are unacceptable for Xpert Xpress SARS-CoV-2/FLU/RSV testing.  Fact Sheet for Patients: EntrepreneurPulse.com.au  Fact Sheet for Healthcare Providers: IncredibleEmployment.be  This test is not yet approved or cleared by the [place] FDA and has been authorized for detection and/or diagnosis of SARS-CoV-2 by FDA under an Emergency Use  Authorization (EUA). This EUA will remain in effect (meaning this test can be used) for the duration of the COVID-19 declaration under Section 564(b)(1) of the Act, 21 U.S.C. section 360bbb-3(b)(1), unless the authorization is terminated or revoked.  Performed at [facility], [place] [address] [place], [place] [address]          Radiology Studies: DG Chest 1 View  Result Date: 03/28/2021 CLINICAL DATA:  Fall with RIGHT hip pain. EXAM: CHEST  1 VIEW COMPARISON:  Chest x-ray dated 03/20/2014. FINDINGS: The heart size and mediastinal contours are within normal limits. Both lungs are clear. The visualized skeletal structures are unremarkable. IMPRESSION: No active disease. Electronically Signed   By: Franki Cabot M.D.   On: 03/28/2021 13:52   Pelvis Portable  Result Date: 03/28/2021 CLINICAL DATA:  Status post ORIF of proximal right femoral fracture EXAM: PORTABLE PELVIS 2 VIEWS COMPARISON:  Intraoperative films from earlier in the same day. FINDINGS: Postsurgical changes are noted in the proximal right femur. Fracture fragments are in near anatomic alignment. Pelvic ring appears intact. No other focal abnormality is noted. IMPRESSION: Status post ORIF of proximal right femoral fracture. Electronically  Signed   By: Inez Catalina M.D.   On: 03/28/2021 22:34   DG C-Arm 1-60 Min-No Report  Result Date: 03/28/2021 Fluoroscopy was utilized by the requesting physician.  No radiographic interpretation.   DG Hip Unilat With Pelvis 2-3 Views Right  Result Date: 03/28/2021 CLINICAL DATA:  Fall with RIGHT hip pain. EXAM: DG HIP (WITH OR WITHOUT PELVIS) 2-3V RIGHT COMPARISON:  None. FINDINGS: Markedly displaced/comminuted fracture of the RIGHT femoral neck, centered at the trochanters, with 90 degree angulation deformity at the fracture site. Femoral head remains grossly well positioned relative to the acetabulum. Remainder of the osseous pelvis appears intact and normally aligned.  IMPRESSION: Markedly displaced/comminuted fracture of the RIGHT femoral neck, intertrochanteric, with 90 degree angulation deformity at the fracture site. Electronically Signed   By: Franki Cabot M.D.   On: 03/28/2021 13:51   DG FEMUR, MIN 2 VIEWS RIGHT  Result Date: 03/28/2021 CLINICAL DATA:  Known right femoral fracture EXAM: RIGHT FEMUR 2 VIEWS COMPARISON:  Film from earlier in the same day. FLUOROSCOPY TIME:  Radiation Exposure Index (as provided by the fluoroscopic device): 17.07 mGy If the device does not provide the exposure index: Fluoroscopy Time:  48 seconds Number of Acquired Images:  4 FINDINGS: Medullary rod and fixation screws are noted in the proximal right femur. Fracture fragments are in near anatomic alignment. IMPRESSION: Status post ORIF of proximal right femoral fracture. Electronically Signed   By: Inez Catalina M.D.   On: 03/28/2021 21:20        Scheduled Meds:  acetaminophen  500 mg Oral Q6H   docusate sodium  100 mg Oral BID   docusate sodium  100 mg Oral BID   enoxaparin (LOVENOX) injection  40 mg Subcutaneous Q24H   morphine       naproxen  250 mg Oral BID WC   Continuous Infusions:  sodium chloride 75 mL/hr at 03/29/21 0905   methocarbamol (ROBAXIN) IV     methocarbamol (ROBAXIN) IV Stopped (03/29/21 0110)     LOS: 1 day    Time spent: 39 minutes spent on chart review, discussion with nursing staff, consultants, updating family and interview/physical exam; more than 50% of that time was spent in counseling and/or coordination of care.    Zephyra Bernardi J [place] DO [facility] Available via Epic secure chat 7am-7pm After these hours, please refer to coverage provider listed on [email or web address] 03/29/2021, 3:57 PM

## 2021-03-29 NOTE — Op Note (Signed)
NAMECHENEA, Wendy Watts MEDICAL RECORD NO: [id number] ACCOUNT NO: [id number] DATE OF BIRTH: Aug 03, 1946 FACILITY: [facility] LOCATION: [facility] PHYSICIAN: Yetta Barre. Marlou Sa, MD  Operative Report   PREOPERATIVE DIAGNOSIS:  Right hip intertrochanteric fracture.  POSTOPERATIVE DIAGNOSIS:  Right hip intertrochanteric fracture.  PROCEDURE:  Right hip intertrochanteric fracture open reduction internal fixation with Smith and Nephew IMHS 10 x 36 with one compression screw and 1 lag screw.  SURGEON:  Yetta Barre. Marlou Sa, MD  ASSISTANT:  Annie Main, PA  INDICATIONS:  The patient is a 75 year old patient with right hip pain following a fall this morning, presents for operative management.  She ambulates without assistive devices, but does have dementia.  PROCEDURE IN DETAIL:  The patient was brought to the operating room where spinal anesthetic was induced.  Preoperative antibiotics administered.  Timeout was called.  The patient was placed on the Hana bed with the left leg in the lithotomy position with  the peroneal nerve well padded.  Right leg was placed under some traction and mild internal rotation.  Fracture reduction confirmed in the AP and lateral planes under fluoroscopy.  A nail size was confirmed preoperatively.  A timeout was called.  Right  hip region was pre-scrubbed with alcohol, Betadine, allowed the air to dry, prepped with DuraPrep solution and draped in sterile manner.  [place] wall drape used to cover the operative field.  Incision made 4 fingerbreadths proximal to the tip of the  trochanter.  Skin and subcutaneous tissue were sharply divided.  Guidepin placed across the tip of the trochanter and into the femur.  Through a separate incision, the relative apex anterior angulation of the femoral neck and shaft was reduced with a  Cobb elevator.  Next, the pin was placed across the femur from the trochanter entry site.  Proximal reaming performed.  The nail was placed.  Correct location confirmed in  the AP and lateral planes under fluoroscopy guide.  The pin was then placed in the  appropriate version into the femoral head with the neck reduced.  This gave [name] good stable reduction.  Reaming was performed and a lag screw and compression screw was placed and the traction was released when the compression screw was tightened.  This  gave about 7 mm of compression, which reduced the fracture nicely.  The compression and lag screw were both in the bottom half of the femoral neck and optimal bone quality.  Following this, a thorough irrigation was performed in both incisions.  Both  incisions were numbed using a Marcaine, morphine, clonidine.  The incisions were then closed using #1 Vicryl suture followed by interrupted inverted 2-0 Vicryl suture, 3-0 nylon sutures and Aquacel.  Fluoroscopy did demonstrate correct placement of the  nail distally and proximally.  The patient tolerated the procedure well without immediate complication and was transferred to the recovery room in stable condition.  Luke's assistance was required at all times for retraction, opening, closing,  mobilization of tissue.  His assistance was a medical necessity.   NIK D: 03/28/2021 9:19:31 pm T: 03/29/2021 4:47:00 am  JOB: [id number]

## 2021-03-29 NOTE — Progress Notes (Signed)
  Subjective: ELHAM SEBASTIANI is a 75 y.o. female s/p right hip IM nail.  They are POD 1.  Pt's pain is controlled.  Denies any complaint of hip pain.  She has not ambulated.  Denies any chest pain, shortness of breath, calf pain.  Objective: Vital signs in last 24 hours: Temp:  [97.4 F (36.3 C)-98.5 F (36.9 C)] 98.4 F (36.9 C) (08/15 0854) Pulse Rate:  [45-104] 70 (08/15 0854) Resp:  [10-23] 12 (08/15 0854) BP: (70-150)/(46-123) 101/55 (08/15 0854) SpO2:  [92 %-100 %] 100 % (08/15 0854) Weight:  [64.4 kg] 64.4 kg (08/14 1126)  Intake/Output from previous day: 08/14 0701 - 08/15 0700 In: 2340 [P.O.:350; I.V.:940; IV Piggyback:750] Out: [id number] [Urine:825; Blood:50] Intake/Output this shift: No intake/output data recorded.  Exam:  No gross blood or drainage overlying the dressing 1+ DP pulse Sensation intact distally in the right foot Able to dorsiflex and plantarflex the right foot No calf tenderness bilaterally   Labs: Recent Labs    03/28/21 1209 03/29/21 0137  HGB 13.3 10.9*   Recent Labs    03/28/21 1209 03/29/21 0137  WBC 15.3* 15.2*  RBC 4.28 3.51*  HCT 40.5 34.0*  PLT 324 277   Recent Labs    03/28/21 1209 03/29/21 0137  NA 141 140  K 3.7 3.7  CL 108 110  CO2 24 25  BUN 15 14  CREATININE 0.85 0.88  GLUCOSE 158* 182*  CALCIUM 9.3 9.2   No results for input(s): LABPT, INR in the last 72 hours.  Assessment/Plan: Pt is POD 1 s/p right hip IM nail.    -Disposition pending medical team clearance and decision; anticipate discharge to skilled nursing facility given patient's nonweightbearing status and baseline dementia  -Nonweightbearing right lower extremity; okay for touchdown weight bearing to right lower extremity for transfers only  -DVT Prophylaxis: Aspirin 81 mg this morning and Lovenox 40 mg this evening with his Lovenox after that.     Analeah Brame L Verda Mehta 03/29/2021, 9:52 AM

## 2021-03-30 ENCOUNTER — Encounter (HOSPITAL_COMMUNITY): Payer: Self-pay | Admitting: Orthopedic Surgery

## 2021-03-30 DIAGNOSIS — Z743 Need for continuous supervision: Secondary | ICD-10-CM | POA: Diagnosis not present

## 2021-03-30 DIAGNOSIS — R41841 Cognitive communication deficit: Secondary | ICD-10-CM | POA: Diagnosis not present

## 2021-03-30 DIAGNOSIS — W19XXXA Unspecified fall, initial encounter: Secondary | ICD-10-CM | POA: Diagnosis not present

## 2021-03-30 DIAGNOSIS — R278 Other lack of coordination: Secondary | ICD-10-CM | POA: Diagnosis not present

## 2021-03-30 DIAGNOSIS — D500 Iron deficiency anemia secondary to blood loss (chronic): Secondary | ICD-10-CM | POA: Diagnosis not present

## 2021-03-30 DIAGNOSIS — F0390 Unspecified dementia without behavioral disturbance: Secondary | ICD-10-CM | POA: Diagnosis not present

## 2021-03-30 DIAGNOSIS — M6281 Muscle weakness (generalized): Secondary | ICD-10-CM | POA: Diagnosis not present

## 2021-03-30 DIAGNOSIS — M25551 Pain in right hip: Secondary | ICD-10-CM | POA: Diagnosis not present

## 2021-03-30 DIAGNOSIS — R2681 Unsteadiness on feet: Secondary | ICD-10-CM | POA: Diagnosis not present

## 2021-03-30 DIAGNOSIS — M62838 Other muscle spasm: Secondary | ICD-10-CM | POA: Diagnosis not present

## 2021-03-30 DIAGNOSIS — S72001A Fracture of unspecified part of neck of right femur, initial encounter for closed fracture: Secondary | ICD-10-CM | POA: Diagnosis not present

## 2021-03-30 DIAGNOSIS — E559 Vitamin D deficiency, unspecified: Secondary | ICD-10-CM | POA: Diagnosis not present

## 2021-03-30 DIAGNOSIS — Z471 Aftercare following joint replacement surgery: Secondary | ICD-10-CM | POA: Diagnosis not present

## 2021-03-30 DIAGNOSIS — S72091D Other fracture of head and neck of right femur, subsequent encounter for closed fracture with routine healing: Secondary | ICD-10-CM | POA: Diagnosis not present

## 2021-03-30 DIAGNOSIS — K5901 Slow transit constipation: Secondary | ICD-10-CM | POA: Diagnosis not present

## 2021-03-30 DIAGNOSIS — Z9181 History of falling: Secondary | ICD-10-CM | POA: Diagnosis not present

## 2021-03-30 DIAGNOSIS — F411 Generalized anxiety disorder: Secondary | ICD-10-CM | POA: Diagnosis not present

## 2021-03-30 DIAGNOSIS — S72001S Fracture of unspecified part of neck of right femur, sequela: Secondary | ICD-10-CM | POA: Diagnosis not present

## 2021-03-30 DIAGNOSIS — E782 Mixed hyperlipidemia: Secondary | ICD-10-CM | POA: Diagnosis not present

## 2021-03-30 DIAGNOSIS — F0150 Vascular dementia without behavioral disturbance: Secondary | ICD-10-CM | POA: Diagnosis not present

## 2021-03-30 DIAGNOSIS — J302 Other seasonal allergic rhinitis: Secondary | ICD-10-CM | POA: Diagnosis not present

## 2021-03-30 DIAGNOSIS — R5381 Other malaise: Secondary | ICD-10-CM | POA: Diagnosis not present

## 2021-03-30 DIAGNOSIS — M9701XA Periprosthetic fracture around internal prosthetic right hip joint, initial encounter: Secondary | ICD-10-CM | POA: Diagnosis not present

## 2021-03-30 DIAGNOSIS — R4182 Altered mental status, unspecified: Secondary | ICD-10-CM | POA: Diagnosis not present

## 2021-03-30 DIAGNOSIS — R2689 Other abnormalities of gait and mobility: Secondary | ICD-10-CM | POA: Diagnosis not present

## 2021-03-30 DIAGNOSIS — Z4789 Encounter for other orthopedic aftercare: Secondary | ICD-10-CM | POA: Diagnosis not present

## 2021-03-30 LAB — CBC
HCT: 27.1 % — ABNORMAL LOW (ref 36.0–46.0)
Hemoglobin: 8.7 g/dL — ABNORMAL LOW (ref 12.0–15.0)
MCH: 31.1 pg (ref 26.0–34.0)
MCHC: 32.1 g/dL (ref 30.0–36.0)
MCV: 96.8 fL (ref 80.0–100.0)
Platelets: 216 10*3/uL (ref 150–400)
RBC: 2.8 MIL/uL — ABNORMAL LOW (ref 3.87–5.11)
RDW: 13.6 % (ref 11.5–15.5)
WBC: 11 10*3/uL — ABNORMAL HIGH (ref 4.0–10.5)
nRBC: 0 % (ref 0.0–0.2)

## 2021-03-30 MED ORDER — DOCUSATE SODIUM 100 MG PO CAPS: 100.0000 mg | ORAL_CAPSULE | Freq: Two times a day (BID) | ORAL | 0 refills | Status: DC

## 2021-03-30 MED ORDER — ADULT MULTIVITAMIN W/MINERALS CH
1.0000 | ORAL_TABLET | Freq: Every day | ORAL | Status: DC
  Administered 2021-03-30: 1 via ORAL
  Filled 2021-03-30: qty 1

## 2021-03-30 MED ORDER — TRAMADOL HCL 50 MG PO TABS: 50.0000 mg | ORAL_TABLET | Freq: Four times a day (QID) | ORAL | 0 refills | Status: DC | PRN

## 2021-03-30 MED ORDER — METHOCARBAMOL 500 MG PO TABS
500.0000 mg | ORAL_TABLET | Freq: Three times a day (TID) | ORAL | Status: DC | PRN
Start: 2021-03-30 — End: 2021-05-12

## 2021-03-30 MED ORDER — ENOXAPARIN SODIUM 40 MG/0.4ML IJ SOSY: 40.0000 mg | PREFILLED_SYRINGE | INTRAMUSCULAR | 0 refills | Status: DC

## 2021-03-30 MED ORDER — ENSURE SURGERY PO LIQD: 237.0000 mL | ORAL | Status: DC

## 2021-03-30 NOTE — Progress Notes (Signed)
Initial Nutrition Assessment  DOCUMENTATION CODES:   Not applicable  INTERVENTION:  - will order Ensure Surgery once/day, each supplement provides 330 kcal and 18 grams protein. - will order Magic Cup with dinner meals, each supplement provides 290 kcal and 9 grams of protein. - will order 1 tablet multivitamin with minerals/day.   NUTRITION DIAGNOSIS:   Increased nutrient needs related to hip fracture, post-op healing as evidenced by estimated needs.  GOAL:   Patient will meet greater than or equal to 90% of their needs  MONITOR:   PO intake, Supplement acceptance, Labs, Weight trends  REASON FOR ASSESSMENT:   Consult Hip fracture protocol  ASSESSMENT:   75 year old female with medical history of dementia, anxiety, HLD, and vertigo. She presented to the ED from home on 8/14 following a mechanical fall with subsequent RLE and groin pain. R hip x-ray showed displaced/comminuted fracture of R femoral neck.  Patient noted to be a/o to self. Diet advanced from NPO to Regular on 8/14 after meal time had ended. The only documented meal intakes were 0% of dinner yesterday and 50% of breakfast today (325 kcal and 2.5 grams protein).   She has not been seen by a [place] RD at any time in the past.   Weight on 8/14 was 142 lb and weight is up compared to 136 lb on 07/28/20. Non-pitting edema to RLE documented in the edema section of flow sheet. She is noted to be +1.4 L since admission.   Patient is POD #2 R hip intertrochanteric fx open reduction with internal fixation. MD note states patient is stable for d/c pending SNF placement.     Labs reviewed. Medications reviewed; 100 mg colace BID.    NUTRITION - FOCUSED PHYSICAL EXAM:  Unable to complete at this time.  Diet Order:   Diet Order             Diet regular Room service appropriate? Yes; Fluid consistency: Thin  Diet effective now                   EDUCATION NEEDS:   No education needs have been  identified at this time  Skin:  Skin Assessment: Skin Integrity Issues: Skin Integrity Issues:: Incisions Incisions: R hip (8/14)  Last BM:  8/13 (day PTA)  Height:   Ht Readings from Last 1 Encounters:  03/28/21 '5\' 5"'$  (1.651 m)    Weight:   Wt Readings from Last 1 Encounters:  03/28/21 64.4 kg      Estimated Nutritional Needs:  Kcal:  1610-1805 kcal Protein:  80-90 grams Fluid:  >/= 1.7 L/day     Jarome Matin, MS, RD, LDN, CNSC Inpatient Clinical Dietitian RD pager # available in AMION  After hours/weekend pager # available in [facility]

## 2021-03-30 NOTE — Progress Notes (Signed)
Patient left via transport.

## 2021-03-30 NOTE — Discharge Summary (Signed)
Physician Discharge Summary  HAIFA STIGLER [id number] DOB: 12-09-1945 DOA: 03/28/2021  PCP: [name], MD  Admit date: 03/28/2021 Discharge date: 03/30/2021  Admitted From: Home Disposition: SNF  Recommendations for Outpatient Follow-up:  Follow up with PCP in 1-2 weeks Follow-up with orthopedics, Dr. Marlou Sa 2 weeks Nonweightbearing right hip, only touchdown weightbearing on transfers Continue postoperative DVT prophylaxis with Lovenox Please obtain BMP/CBC in one week   Discharge Condition: Stable CODE STATUS: Full code Diet recommendation: Regular diet  History of present illness:  Wendy Watts is a 75 year old female with past medical history significant for dementia, anxiety, HLD who presented to [facility] ED on 03/28/2021 from home following mechanical fall with subsequent right lower extremity and groin pain.   In the ED, patient is afebrile, BP 150/73, HR 70, SPO2 97% on room air.  Sodium 141, potassium 3.7, chloride 108, CO2 24, glucose 158, BUN 15, creatinine 0.85, AST 16, ALT 15, total bilirubin 0.9.  WBC 15.3, hemoglobin 13.3, platelets 324.  Covid-19 PCR negative.  Influenza A/B PCR negative.  Chest x-ray negative for acute cardiopulmonary disease process.  Right hip x-ray with markedly displaced/comminuted fracture right femoral neck, intertrochanteric with 90 degree angulation deformity at the fracture site.  Orthopedics was consulted.  TRH consulted for further evaluation management of right intertrochanteric hip fracture.  Hospital course:  Right displaced/comminuted/angulated intertrochanteric hip fracture, POA Patient presenting from home following mechanical fall with subsequent acute pain and inability to ambulate.  On arrival to the ED, x-rays notable for markedly displaced/comminuted/angulated intertrochanteric right hip fracture.  Orthopedics, Dr. Marlou Sa was consulted and patient underwent ORIF on 8/14 2022. Nonweightbearing RLE, okay for touchdown weightbearing  RLE for transfers only. Postoperative DVT prophylaxis with Lovenox per orthopedics. Robaxin 500 mg every 6 hours as needed muscle spasms, Tramadol 50 mg every 6 hours as needed moderate pain.  Discharging to SNF for further rehabilitation.  Postoperative blood loss anemia Hemoglobin trended down from 13.3-8.7 at time of discharge.  Recommend repeat CBC 1 week.   Dementia Continue Aricept and Namenda   Anxiety: Continue BuSpar 10 mg p.o. twice daily  Discharge Diagnoses:  Active Problems:   Dementia without behavioral disturbance [facility])    Discharge Instructions  Discharge Instructions     Call MD for:  difficulty breathing, headache or visual disturbances   Complete by: As directed    Call MD for:  extreme fatigue   Complete by: As directed    Call MD for:  persistant dizziness or light-headedness   Complete by: As directed    Call MD for:  persistant nausea and vomiting   Complete by: As directed    Call MD for:  redness, tenderness, or signs of infection (pain, swelling, redness, odor or green/yellow discharge around incision site)   Complete by: As directed    Call MD for:  severe uncontrolled pain   Complete by: As directed    Call MD for:  temperature >100.4   Complete by: As directed    Diet - low sodium heart healthy   Complete by: As directed    Increase activity slowly   Complete by: As directed    No wound care   Complete by: As directed       Allergies as of 03/30/2021       Reactions   Allegra [fexofenadine Hcl] Other (See Comments)   "takes me to another world"   Ativan [lorazepam] Other (See Comments)   Hallucinations   Azithromycin Nausea And Vomiting  Codeine Hives   Doxycycline Hyclate Nausea Only   Penicillins Hives   Has patient had a PCN reaction causing immediate rash, facial/tongue/throat swelling, SOB or lightheadedness with hypotension: no Has patient had a PCN reaction causing severe rash involving mucus membranes or skin necrosis:  no Has patient had a PCN reaction that required hospitalization: no Has patient had a PCN reaction occurring within the last 10 years: no If all of the above answers are "NO", then may proceed with Cephalosporin use.   Trazodone And Nefazodone Nausea Only   Zyrtec [cetirizine] Other (See Comments)   "takes me to another world"        Medication List     TAKE these medications    busPIRone 10 MG tablet Commonly known as: BUSPAR Take 10 mg by mouth 2 (two) times daily.   docusate sodium 100 MG capsule Commonly known as: COLACE Take 1 capsule (100 mg total) by mouth 2 (two) times daily.   donepezil 10 MG tablet Commonly known as: ARICEPT TAKE 1 TABLET(10 MG) BY MOUTH AT BEDTIME What changed:  how much to take how to take this when to take this additional instructions   enoxaparin 40 MG/0.4ML injection Commonly known as: LOVENOX Inject 0.4 mLs (40 mg total) into the skin daily.   fluticasone 50 MCG/ACT nasal spray Commonly known as: FLONASE Place 2 sprays into both nostrils daily as needed for allergies or rhinitis.   memantine 10 MG tablet Commonly known as: Namenda Take 1 tablet (10 mg total) by mouth 2 (two) times daily.   methocarbamol 500 MG tablet Commonly known as: ROBAXIN Take 1 tablet (500 mg total) by mouth every 8 (eight) hours as needed for muscle spasms.   traMADol 50 MG tablet Commonly known as: ULTRAM Take 1 tablet (50 mg total) by mouth every 6 (six) hours as needed for moderate pain.   VITAMIN D PO Take 1 tablet by mouth daily.        Contact information for follow-up providers     Marlou Sa, Tonna Corner, MD. Schedule an appointment as soon as possible for a visit in 2 week(s).   Specialty: Orthopedic Surgery Contact information: [place] [address] [phone]              Contact information for after-discharge care     Destination     [facility] .   Service: Skilled Nursing Contact  information: [address] [place] [phone]                    Allergies  Allergen Reactions   Allegra [Fexofenadine Hcl] Other (See Comments)    "takes me to another world"   Ativan [Lorazepam] Other (See Comments)    Hallucinations   Azithromycin Nausea And Vomiting   Codeine Hives   Doxycycline Hyclate Nausea Only   Penicillins Hives    Has patient had a PCN reaction causing immediate rash, facial/tongue/throat swelling, SOB or lightheadedness with hypotension: no Has patient had a PCN reaction causing severe rash involving mucus membranes or skin necrosis: no Has patient had a PCN reaction that required hospitalization: no Has patient had a PCN reaction occurring within the last 10 years: no If all of the above answers are "NO", then may proceed with Cephalosporin use.    Trazodone And Nefazodone Nausea Only   Zyrtec [Cetirizine] Other (See Comments)    "takes me to another world"    Consultations: Orthopedics, Dr. Marlou Sa  Procedures/Studies: DG Chest 1 View  Result Date: 03/28/2021 CLINICAL DATA:  Fall with RIGHT hip pain. EXAM: CHEST  1 VIEW COMPARISON:  Chest x-ray dated 03/20/2014. FINDINGS: The heart size and mediastinal contours are within normal limits. Both lungs are clear. The visualized skeletal structures are unremarkable. IMPRESSION: No active disease. Electronically Signed   By: Franki Cabot M.D.   On: 03/28/2021 13:52   Pelvis Portable  Result Date: 03/28/2021 CLINICAL DATA:  Status post ORIF of proximal right femoral fracture EXAM: PORTABLE PELVIS 2 VIEWS COMPARISON:  Intraoperative films from earlier in the same day. FINDINGS: Postsurgical changes are noted in the proximal right femur. Fracture fragments are in near anatomic alignment. Pelvic ring appears intact. No other focal abnormality is noted. IMPRESSION: Status post ORIF of proximal right femoral fracture. Electronically Signed   By: Inez Catalina M.D.    On: 03/28/2021 22:34   DG C-Arm 1-60 Min-No Report  Result Date: 03/28/2021 Fluoroscopy was utilized by the requesting physician.  No radiographic interpretation.   DG Hip Unilat With Pelvis 2-3 Views Right  Result Date: 03/28/2021 CLINICAL DATA:  Fall with RIGHT hip pain. EXAM: DG HIP (WITH OR WITHOUT PELVIS) 2-3V RIGHT COMPARISON:  None. FINDINGS: Markedly displaced/comminuted fracture of the RIGHT femoral neck, centered at the trochanters, with 90 degree angulation deformity at the fracture site. Femoral head remains grossly well positioned relative to the acetabulum. Remainder of the osseous pelvis appears intact and normally aligned. IMPRESSION: Markedly displaced/comminuted fracture of the RIGHT femoral neck, intertrochanteric, with 90 degree angulation deformity at the fracture site. Electronically Signed   By: Franki Cabot M.D.   On: 03/28/2021 13:51   DG FEMUR, MIN 2 VIEWS RIGHT  Result Date: 03/28/2021 CLINICAL DATA:  Known right femoral fracture EXAM: RIGHT FEMUR 2 VIEWS COMPARISON:  Film from earlier in the same day. FLUOROSCOPY TIME:  Radiation Exposure Index (as provided by the fluoroscopic device): 17.07 mGy If the device does not provide the exposure index: Fluoroscopy Time:  48 seconds Number of Acquired Images:  4 FINDINGS: Medullary rod and fixation screws are noted in the proximal right femur. Fracture fragments are in near anatomic alignment. IMPRESSION: Status post ORIF of proximal right femoral fracture. Electronically Signed   By: Inez Catalina M.D.   On: 03/28/2021 21:20     Subjective: Patient seen examined at bedside, resting comfortably.  Pleasantly confused.  States pain is controlled.  No family present.  No other questions or concerns at this time.  Discharging to SNF today.  Denies headache, no chest pain, no palpitations, no shortness of breath, no abdominal pain.  No acute events overnight per nursing staff.  Discharge Exam: Vitals:   03/30/21 0952 03/30/21 1412   BP: 122/64 (!) 113/102  Pulse: 78 86  Resp: 16 17  Temp: 98.3 F (36.8 C) 98.2 F (36.8 C)  SpO2: 100% 97%   Vitals:   03/30/21 0113 03/30/21 0550 03/30/21 0952 03/30/21 1412  BP: (!) 96/43 113/63 122/64 (!) 113/102  Pulse: 72 75 78 86  Resp: '18 18 16 17  '$ Temp: 98.2 F (36.8 C) 98.1 F (36.7 C) 98.3 F (36.8 C) 98.2 F (36.8 C)  TempSrc: Oral Oral  Oral  SpO2: 100% 100% 100% 97%  Weight:      Height:        General: Pt is alert, awake, not in acute distress Cardiovascular: RRR, S1/S2 +, no rubs, no gallops Respiratory: CTA bilaterally, no wheezing, no rhonchi, on room air Abdominal: Soft, NT,  ND, bowel sounds + Extremities: no edema, no cyanosis, dressings noted in place, clean/dry/intact    The results of significant diagnostics from this hospitalization (including imaging, microbiology, ancillary and laboratory) are listed below for reference.     Microbiology: Recent Results (from the past 240 hour(s))  SARS CORONAVIRUS 2 (TAT 6-24 HRS) Nasopharyngeal Nasopharyngeal Swab     Status: None   Collection Time: 03/28/21  1:01 PM   Specimen: Nasopharyngeal Swab  Result Value Ref Range Status   SARS Coronavirus 2 NEGATIVE NEGATIVE Final    Comment: (NOTE) SARS-CoV-2 target nucleic acids are NOT DETECTED.  The SARS-CoV-2 RNA is generally detectable in upper and lower respiratory specimens during the acute phase of infection. Negative results do not preclude SARS-CoV-2 infection, do not rule out co-infections with other pathogens, and should not be used as the sole basis for treatment or other patient management decisions. Negative results must be combined with clinical observations, patient history, and epidemiological information. The expected result is Negative.  Fact Sheet for Patients: SugarRoll.be  Fact Sheet for Healthcare Providers: https://www.woods-mathews.com/  This test is not yet approved or cleared by the  [place] FDA and  has been authorized for detection and/or diagnosis of SARS-CoV-2 by FDA under an Emergency Use Authorization (EUA). This EUA will remain  in effect (meaning this test can be used) for the duration of the COVID-19 declaration under Se ction 564(b)(1) of the Act, 21 U.S.C. section 360bbb-3(b)(1), unless the authorization is terminated or revoked sooner.  Performed at [facility], [place] [address], [place], [place] [address]   Resp Panel by RT-PCR (Flu A&B, Covid) Nasopharyngeal Swab     Status: None   Collection Time: 03/28/21  6:26 PM   Specimen: Nasopharyngeal Swab; Nasopharyngeal(NP) swabs in vial transport medium  Result Value Ref Range Status   SARS Coronavirus 2 by RT PCR NEGATIVE NEGATIVE Final    Comment: (NOTE) SARS-CoV-2 target nucleic acids are NOT DETECTED.  The SARS-CoV-2 RNA is generally detectable in upper respiratory specimens during the acute phase of infection. The lowest concentration of SARS-CoV-2 viral copies this assay can detect is 138 copies/mL. A negative result does not preclude SARS-Cov-2 infection and should not be used as the sole basis for treatment or other patient management decisions. A negative result may occur with  improper specimen collection/handling, submission of specimen other than nasopharyngeal swab, presence of viral mutation(s) within the areas targeted by this assay, and inadequate number of viral copies(<138 copies/mL). A negative result must be combined with clinical observations, patient history, and epidemiological information. The expected result is Negative.  Fact Sheet for Patients:  EntrepreneurPulse.com.au  Fact Sheet for Healthcare Providers:  IncredibleEmployment.be  This test is no t yet approved or cleared by the [place] FDA and  has been authorized for detection and/or diagnosis of SARS-CoV-2 by FDA under an Emergency Use Authorization (EUA). This EUA  will remain  in effect (meaning this test can be used) for the duration of the COVID-19 declaration under Section 564(b)(1) of the Act, 21 U.S.C.section 360bbb-3(b)(1), unless the authorization is terminated  or revoked sooner.       Influenza A by PCR NEGATIVE NEGATIVE Final   Influenza B by PCR NEGATIVE NEGATIVE Final    Comment: (NOTE) The Xpert Xpress SARS-CoV-2/FLU/RSV plus assay is intended as an aid in the diagnosis of influenza from Nasopharyngeal swab specimens and should not be used as a sole basis for treatment. Nasal washings and aspirates are unacceptable for Xpert Xpress  SARS-CoV-2/FLU/RSV testing.  Fact Sheet for Patients: EntrepreneurPulse.com.au  Fact Sheet for Healthcare Providers: IncredibleEmployment.be  This test is not yet approved or cleared by the [place] FDA and has been authorized for detection and/or diagnosis of SARS-CoV-2 by FDA under an Emergency Use Authorization (EUA). This EUA will remain in effect (meaning this test can be used) for the duration of the COVID-19 declaration under Section 564(b)(1) of the Act, 21 U.S.C. section 360bbb-3(b)(1), unless the authorization is terminated or revoked.  Performed at [facility], [place] [address] [place], [place] [address]      Labs: BNP (last 3 results) No results for input(s): BNP in the last 8760 hours. Basic Metabolic Panel: Recent Labs  Lab 03/28/21 1209 03/29/21 0137  NA 141 140  K 3.7 3.7  CL 108 110  CO2 24 25  GLUCOSE 158* 182*  BUN 15 14  CREATININE 0.85 0.88  CALCIUM 9.3 9.2   Liver Function Tests: Recent Labs  Lab 03/28/21 1209  AST 16  ALT 15  ALKPHOS 66  BILITOT 0.9  PROT 6.3*  ALBUMIN 3.4*   No results for input(s): LIPASE, AMYLASE in the last 168 hours. No results for input(s): AMMONIA in the last 168 hours. CBC: Recent Labs  Lab 03/28/21 1209 03/29/21 0137 03/30/21 0304  WBC 15.3* 15.2* 11.0*   NEUTROABS 13.6*  --   --   HGB 13.3 10.9* 8.7*  HCT 40.5 34.0* 27.1*  MCV 94.6 96.9 96.8  PLT 324 277 216   Cardiac Enzymes: No results for input(s): CKTOTAL, CKMB, CKMBINDEX, TROPONINI in the last 168 hours. BNP: Invalid input(s): POCBNP CBG: No results for input(s): GLUCAP in the last 168 hours. D-Dimer No results for input(s): DDIMER in the last 72 hours. Hgb A1c Recent Labs    03/29/21 0137  HGBA1C 5.4   Lipid Profile No results for input(s): CHOL, HDL, LDLCALC, TRIG, CHOLHDL, LDLDIRECT in the last 72 hours. Thyroid function studies Recent Labs    03/28/21 1823  TSH 0.562   Anemia work up No results for input(s): VITAMINB12, FOLATE, FERRITIN, TIBC, IRON, RETICCTPCT in the last 72 hours. Urinalysis    Component Value Date/Time   COLORURINE YELLOW 03/29/2021 1518   APPEARANCEUR CLEAR 03/29/2021 1518   LABSPEC 1.015 03/29/2021 1518   PHURINE 5.0 03/29/2021 1518   GLUCOSEU 50 (A) 03/29/2021 1518   HGBUR NEGATIVE 03/29/2021 1518   BILIRUBINUR NEGATIVE 03/29/2021 1518   KETONESUR NEGATIVE 03/29/2021 1518   PROTEINUR NEGATIVE 03/29/2021 1518   UROBILINOGEN 0.2 02/01/2010 1201   NITRITE NEGATIVE 03/29/2021 1518   LEUKOCYTESUR NEGATIVE 03/29/2021 1518   Sepsis Labs Invalid input(s): PROCALCITONIN,  WBC,  LACTICIDVEN Microbiology Recent Results (from the past 240 hour(s))  SARS CORONAVIRUS 2 (TAT 6-24 HRS) Nasopharyngeal Nasopharyngeal Swab     Status: None   Collection Time: 03/28/21  1:01 PM   Specimen: Nasopharyngeal Swab  Result Value Ref Range Status   SARS Coronavirus 2 NEGATIVE NEGATIVE Final    Comment: (NOTE) SARS-CoV-2 target nucleic acids are NOT DETECTED.  The SARS-CoV-2 RNA is generally detectable in upper and lower respiratory specimens during the acute phase of infection. Negative results do not preclude SARS-CoV-2 infection, do not rule out co-infections with other pathogens, and should not be used as the sole basis for treatment or other  patient management decisions. Negative results must be combined with clinical observations, patient history, and epidemiological information. The expected result is Negative.  Fact Sheet for Patients: SugarRoll.be  Fact Sheet for Healthcare Providers:  https://www.woods-mathews.com/  This test is not yet approved or cleared by the [place] and  has been authorized for detection and/or diagnosis of SARS-CoV-2 by FDA under an Emergency Use Authorization (EUA). This EUA will remain  in effect (meaning this test can be used) for the duration of the COVID-19 declaration under Se ction 564(b)(1) of the Act, 21 U.S.C. section 360bbb-3(b)(1), unless the authorization is terminated or revoked sooner.  Performed at [facility], [place] [address] [place], [place] [address]   Resp Panel by RT-PCR (Flu A&B, Covid) Nasopharyngeal Swab     Status: None   Collection Time: 03/28/21  6:26 PM   Specimen: Nasopharyngeal Swab; Nasopharyngeal(NP) swabs in vial transport medium  Result Value Ref Range Status   SARS Coronavirus 2 by RT PCR NEGATIVE NEGATIVE Final    Comment: (NOTE) SARS-CoV-2 target nucleic acids are NOT DETECTED.  The SARS-CoV-2 RNA is generally detectable in upper respiratory specimens during the acute phase of infection. The lowest concentration of SARS-CoV-2 viral copies this assay can detect is 138 copies/mL. A negative result does not preclude SARS-Cov-2 infection and should not be used as the sole basis for treatment or other patient management decisions. A negative result may occur with  improper specimen collection/handling, submission of specimen other than nasopharyngeal swab, presence of viral mutation(s) within the areas targeted by this assay, and inadequate number of viral copies(<138 copies/mL). A negative result must be combined with clinical observations, patient history, and epidemiological information. The  expected result is Negative.  Fact Sheet for Patients:  EntrepreneurPulse.com.au  Fact Sheet for Healthcare Providers:  IncredibleEmployment.be  This test is no t yet approved or cleared by the [place] FDA and  has been authorized for detection and/or diagnosis of SARS-CoV-2 by FDA under an Emergency Use Authorization (EUA). This EUA will remain  in effect (meaning this test can be used) for the duration of the COVID-19 declaration under Section 564(b)(1) of the Act, 21 U.S.C.section 360bbb-3(b)(1), unless the authorization is terminated  or revoked sooner.       Influenza A by PCR NEGATIVE NEGATIVE Final   Influenza B by PCR NEGATIVE NEGATIVE Final    Comment: (NOTE) The Xpert Xpress SARS-CoV-2/FLU/RSV plus assay is intended as an aid in the diagnosis of influenza from Nasopharyngeal swab specimens and should not be used as a sole basis for treatment. Nasal washings and aspirates are unacceptable for Xpert Xpress SARS-CoV-2/FLU/RSV testing.  Fact Sheet for Patients: EntrepreneurPulse.com.au  Fact Sheet for Healthcare Providers: IncredibleEmployment.be  This test is not yet approved or cleared by the [place] FDA and has been authorized for detection and/or diagnosis of SARS-CoV-2 by FDA under an Emergency Use Authorization (EUA). This EUA will remain in effect (meaning this test can be used) for the duration of the COVID-19 declaration under Section 564(b)(1) of the Act, 21 U.S.C. section 360bbb-3(b)(1), unless the authorization is terminated or revoked.  Performed at [facility], [place] [address] [place], [place] [address]      Time coordinating discharge: Over 30 minutes  SIGNED:   Christabelle Hanzlik J [place] DO  [facility] 03/30/2021, 3:18 PM

## 2021-03-30 NOTE — Progress Notes (Signed)
PROGRESS NOTE    Wendy Watts  [id number] DOB: 08/02/1946 DOA: 03/28/2021 PCP: [name], MD    Brief Narrative:  Wendy Watts is a 75 year old female with past medical history significant for dementia, anxiety, HLD who presented to [facility] ED on 03/28/2021 from home following mechanical fall with subsequent right lower extremity and groin pain.  In the ED, patient is afebrile, BP 150/73, HR 70, SPO2 97% on room air.  Sodium 141, potassium 3.7, chloride 108, CO2 24, glucose 158, BUN 15, creatinine 0.85, AST 16, ALT 15, total bilirubin 0.9.  WBC 15.3, hemoglobin 13.3, platelets 324.  Covid-19 PCR negative.  Influenza A/B PCR negative.  Chest x-ray negative for acute cardiopulmonary disease process.  Right hip x-ray with markedly displaced/comminuted fracture right femoral neck, intertrochanteric with 90 degree angulation deformity at the fracture site.  Orthopedics was consulted.  TRH consulted for further evaluation management of right intertrochanteric hip fracture.   Assessment & Plan:   Principal Problem:   Hip fx ([place]) Active Problems:   Dementia without behavioral disturbance ([place])   Right displaced/comminuted/angulated intertrochanteric hip fracture, POA Patient presenting from home following mechanical fall with subsequent acute pain and inability to ambulate.  On arrival to the ED, x-rays notable for markedly displaced/comminuted/angulated intertrochanteric right hip fracture.  Orthopedics, Dr. Marlou Sa was consulted and patient underwent ORIF on 8/14 2022. --Orthopedics following, appreciate assistance --Nonweightbearing RLE, okay for touchdown weightbearing RLE for transfers only --Postoperative DVT prophylaxis with Lovenox per orthopedics --Robaxin 500 mg every 6 hours as needed muscle spasms --Tramadol 50 mg every 6 hours as needed moderate pain --Morphine IV as needed severe breakthrough pain --PT/OT recommending SNF --TOC for placement  Dementia --Continue Aricept  and Namenda  Anxiety: Continue BuSpar 10 mg p.o. twice daily   DVT prophylaxis: enoxaparin (LOVENOX) injection 40 mg Start: 03/29/21 1800 SCDs Start: 03/28/21 2158 Place TED hose Start: 03/28/21 2158 SCDs Start: 03/28/21 1632   Code Status: Full Code Family Communication: No family present at bedside this morning.  Disposition Plan:  Level of care: Med-Surg Status is: Inpatient  Remains inpatient appropriate because:Unsafe d/c plan  Dispo: The patient is from: Home              Anticipated d/c is to: SNF              Patient currently is medically stable to d/c.   Difficult to place patient No   Consultants:  Orthopedics, Dr. Marlou Sa  Procedures:  ORIF, Dr. Marlou Sa 03/28/2021  Antimicrobials:  Perioperative vancomycin   Subjective: Patient seen examined at bedside, resting comfortably.  Pleasantly confused.   No family present.  No specific complaints.  States pain is controlled.  Denies headache, no chest pain, no palpitations, no shortness of breath, no abdominal pain.  No acute events overnight per nursing staff.  Medically stable for discharge to SNF, awaiting TOC for placement.  Objective: Vitals:   03/29/21 2056 03/30/21 0113 03/30/21 0550 03/30/21 0952  BP: 103/71 (!) 96/43 113/63 122/64  Pulse: 70 72 75 78  Resp: '18 18 18 16  '$ Temp: 98.5 F (36.9 C) 98.2 F (36.8 C) 98.1 F (36.7 C) 98.3 F (36.8 C)  TempSrc: Oral Oral Oral   SpO2: 100% 100% 100% 100%  Weight:      Height:        Intake/Output Summary (Last 24 hours) at 03/30/2021 1316 Last data filed at 03/30/2021 1230 Gross per 24 hour  Intake 651.81 ml  Output 1050 ml  Net -398.19 ml   Filed Weights   03/28/21 1126  Weight: 64.4 kg    Examination:  General exam: Appears calm and comfortable, pleasantly confused Respiratory system: Clear to auscultation. Respiratory effort normal.  On 2 L nasal cannula with SPO2 100% at rest Cardiovascular system: S1 & S2 heard, RRR. No JVD, murmurs, rubs, gallops  or clicks. No pedal edema. Gastrointestinal system: Abdomen is nondistended, soft and nontender. No organomegaly or masses felt. Normal bowel sounds heard. Central nervous system: Alert. No focal neurological deficits. Extremities: Symmetric 5 x 5 power. Skin: No rashes, lesions or ulcers, surgical dressing noted, clean/dry/intact Psychiatry: Judgement and insight appear normal. Mood & affect appropriate.     Data Reviewed: I have personally reviewed following labs and imaging studies  CBC: Recent Labs  Lab 03/28/21 1209 03/29/21 0137 03/30/21 0304  WBC 15.3* 15.2* 11.0*  NEUTROABS 13.6*  --   --   HGB 13.3 10.9* 8.7*  HCT 40.5 34.0* 27.1*  MCV 94.6 96.9 96.8  PLT 324 277 [id number]   Basic Metabolic Panel: Recent Labs  Lab 03/28/21 1209 03/29/21 0137  NA 141 140  K 3.7 3.7  CL 108 110  CO2 24 25  GLUCOSE 158* 182*  BUN 15 14  CREATININE 0.85 0.88  CALCIUM 9.3 9.2   GFR: Estimated Creatinine Clearance: 49.7 mL/min (by C-G formula based on SCr of 0.88 mg/dL). Liver Function Tests: Recent Labs  Lab 03/28/21 1209  AST 16  ALT 15  ALKPHOS 66  BILITOT 0.9  PROT 6.3*  ALBUMIN 3.4*   No results for input(s): LIPASE, AMYLASE in the last 168 hours. No results for input(s): AMMONIA in the last 168 hours. Coagulation Profile: No results for input(s): INR, PROTIME in the last 168 hours. Cardiac Enzymes: No results for input(s): CKTOTAL, CKMB, CKMBINDEX, TROPONINI in the last 168 hours. BNP (last 3 results) No results for input(s): PROBNP in the last 8760 hours. HbA1C: Recent Labs    03/29/21 0137  HGBA1C 5.4   CBG: No results for input(s): GLUCAP in the last 168 hours. Lipid Profile: No results for input(s): CHOL, HDL, LDLCALC, TRIG, CHOLHDL, LDLDIRECT in the last 72 hours. Thyroid Function Tests: Recent Labs    03/28/21 1823  TSH 0.562   Anemia Panel: No results for input(s): VITAMINB12, FOLATE, FERRITIN, TIBC, IRON, RETICCTPCT in the last 72 hours. Sepsis  Labs: No results for input(s): PROCALCITON, LATICACIDVEN in the last 168 hours.  Recent Results (from the past 240 hour(s))  SARS CORONAVIRUS 2 (TAT 6-24 HRS) Nasopharyngeal Nasopharyngeal Swab     Status: None   Collection Time: 03/28/21  1:01 PM   Specimen: Nasopharyngeal Swab  Result Value Ref Range Status   SARS Coronavirus 2 NEGATIVE NEGATIVE Final    Comment: (NOTE) SARS-CoV-2 target nucleic acids are NOT DETECTED.  The SARS-CoV-2 RNA is generally detectable in upper and lower respiratory specimens during the acute phase of infection. Negative results do not preclude SARS-CoV-2 infection, do not rule out co-infections with other pathogens, and should not be used as the sole basis for treatment or other patient management decisions. Negative results must be combined with clinical observations, patient history, and epidemiological information. The expected result is Negative.  Fact Sheet for Patients: SugarRoll.be  Fact Sheet for Healthcare Providers: https://www.woods-mathews.com/  This test is not yet approved or cleared by the [place] FDA and  has been authorized for detection and/or diagnosis of SARS-CoV-2 by FDA under an Emergency Use Authorization (EUA). This EUA will remain  in effect (meaning this test can be used) for the duration of the COVID-19 declaration under Se ction 564(b)(1) of the Act, 21 U.S.C. section 360bbb-3(b)(1), unless the authorization is terminated or revoked sooner.  Performed at [facility], [place] [address] [place], [place] [address]   Resp Panel by RT-PCR (Flu A&B, Covid) Nasopharyngeal Swab     Status: None   Collection Time: 03/28/21  6:26 PM   Specimen: Nasopharyngeal Swab; Nasopharyngeal(NP) swabs in vial transport medium  Result Value Ref Range Status   SARS Coronavirus 2 by RT PCR NEGATIVE NEGATIVE Final    Comment: (NOTE) SARS-CoV-2 target nucleic acids are NOT DETECTED.  The  SARS-CoV-2 RNA is generally detectable in upper respiratory specimens during the acute phase of infection. The lowest concentration of SARS-CoV-2 viral copies this assay can detect is 138 copies/mL. A negative result does not preclude SARS-Cov-2 infection and should not be used as the sole basis for treatment or other patient management decisions. A negative result may occur with  improper specimen collection/handling, submission of specimen other than nasopharyngeal swab, presence of viral mutation(s) within the areas targeted by this assay, and inadequate number of viral copies(<138 copies/mL). A negative result must be combined with clinical observations, patient history, and epidemiological information. The expected result is Negative.  Fact Sheet for Patients:  EntrepreneurPulse.com.au  Fact Sheet for Healthcare Providers:  IncredibleEmployment.be  This test is no t yet approved or cleared by the [place] FDA and  has been authorized for detection and/or diagnosis of SARS-CoV-2 by FDA under an Emergency Use Authorization (EUA). This EUA will remain  in effect (meaning this test can be used) for the duration of the COVID-19 declaration under Section 564(b)(1) of the Act, 21 U.S.C.section 360bbb-3(b)(1), unless the authorization is terminated  or revoked sooner.       Influenza A by PCR NEGATIVE NEGATIVE Final   Influenza B by PCR NEGATIVE NEGATIVE Final    Comment: (NOTE) The Xpert Xpress SARS-CoV-2/FLU/RSV plus assay is intended as an aid in the diagnosis of influenza from Nasopharyngeal swab specimens and should not be used as a sole basis for treatment. Nasal washings and aspirates are unacceptable for Xpert Xpress SARS-CoV-2/FLU/RSV testing.  Fact Sheet for Patients: EntrepreneurPulse.com.au  Fact Sheet for Healthcare Providers: IncredibleEmployment.be  This test is not yet approved or  cleared by the [place] FDA and has been authorized for detection and/or diagnosis of SARS-CoV-2 by FDA under an Emergency Use Authorization (EUA). This EUA will remain in effect (meaning this test can be used) for the duration of the COVID-19 declaration under Section 564(b)(1) of the Act, 21 U.S.C. section 360bbb-3(b)(1), unless the authorization is terminated or revoked.  Performed at [facility], [place] [address], [place], [place] [address]          Radiology Studies: DG Chest 1 View  Result Date: 03/28/2021 CLINICAL DATA:  Fall with RIGHT hip pain. EXAM: CHEST  1 VIEW COMPARISON:  Chest x-ray dated 03/20/2014. FINDINGS: The heart size and mediastinal contours are within normal limits. Both lungs are clear. The visualized skeletal structures are unremarkable. IMPRESSION: No active disease. Electronically Signed   By: Franki Cabot M.D.   On: 03/28/2021 13:52   Pelvis Portable  Result Date: 03/28/2021 CLINICAL DATA:  Status post ORIF of proximal right femoral fracture EXAM: PORTABLE PELVIS 2 VIEWS COMPARISON:  Intraoperative films from earlier in the same day. FINDINGS: Postsurgical changes are noted in the proximal right femur. Fracture fragments are in  near anatomic alignment. Pelvic ring appears intact. No other focal abnormality is noted. IMPRESSION: Status post ORIF of proximal right femoral fracture. Electronically Signed   By: Inez Catalina M.D.   On: 03/28/2021 22:34   DG C-Arm 1-60 Min-No Report  Result Date: 03/28/2021 Fluoroscopy was utilized by the requesting physician.  No radiographic interpretation.   DG Hip Unilat With Pelvis 2-3 Views Right  Result Date: 03/28/2021 CLINICAL DATA:  Fall with RIGHT hip pain. EXAM: DG HIP (WITH OR WITHOUT PELVIS) 2-3V RIGHT COMPARISON:  None. FINDINGS: Markedly displaced/comminuted fracture of the RIGHT femoral neck, centered at the trochanters, with 90 degree angulation deformity at the fracture site. Femoral  head remains grossly well positioned relative to the acetabulum. Remainder of the osseous pelvis appears intact and normally aligned. IMPRESSION: Markedly displaced/comminuted fracture of the RIGHT femoral neck, intertrochanteric, with 90 degree angulation deformity at the fracture site. Electronically Signed   By: Franki Cabot M.D.   On: 03/28/2021 13:51   DG FEMUR, MIN 2 VIEWS RIGHT  Result Date: 03/28/2021 CLINICAL DATA:  Known right femoral fracture EXAM: RIGHT FEMUR 2 VIEWS COMPARISON:  Film from earlier in the same day. FLUOROSCOPY TIME:  Radiation Exposure Index (as provided by the fluoroscopic device): 17.07 mGy If the device does not provide the exposure index: Fluoroscopy Time:  48 seconds Number of Acquired Images:  4 FINDINGS: Medullary rod and fixation screws are noted in the proximal right femur. Fracture fragments are in near anatomic alignment. IMPRESSION: Status post ORIF of proximal right femoral fracture. Electronically Signed   By: Inez Catalina M.D.   On: 03/28/2021 21:20        Scheduled Meds:  busPIRone  10 mg Oral BID   docusate sodium  100 mg Oral BID   docusate sodium  100 mg Oral BID   donepezil  10 mg Oral QHS   enoxaparin (LOVENOX) injection  40 mg Subcutaneous Q24H   memantine  10 mg Oral BID   naproxen  250 mg Oral BID WC   Continuous Infusions:  methocarbamol (ROBAXIN) IV     methocarbamol (ROBAXIN) IV Stopped (03/29/21 0110)     LOS: 2 days    Time spent: 39 minutes spent on chart review, discussion with nursing staff, consultants, updating family and interview/physical exam; more than 50% of that time was spent in counseling and/or coordination of care.    Deago Burruss J [place] DO [facility] Available via Epic secure chat 7am-7pm After these hours, please refer to coverage provider listed on [email or web address] 03/30/2021, 1:16 PM

## 2021-03-30 NOTE — Progress Notes (Signed)
Pt voided 200 ml around 7 pm, post voidal bladder scan done its 30m. Pt not in distress.

## 2021-03-30 NOTE — Progress Notes (Signed)
Discharge report given to [facility] in [place] [phone]) Room [address] Waiting PTAR for transportation.

## 2021-03-30 NOTE — Evaluation (Signed)
Occupational Therapy Evaluation Patient Details Name: Wendy Watts MRN: [id number] DOB: Feb 08, 1946 Today's Date: 03/30/2021    History of Present Illness Pt s/p fall with R hip fx and now s/p IM nailing.  Pt with hx of dementia, vertigo, R TKR and HOH   Clinical Impression   Patient with dementia, unable to obtain PLOF/home set up, no family present. Patient needing significant assistance for lower body self care, functional transfers and mobility due to weight bearing restrictions, pain, increased anxiety with mobility and cognitive deficits. Provided max multimodal cues for sit to stand at edge of bed with walker, patient having difficulty with follow through. Max x2 to power up to standing from elevated bed height with OT having to hold R LE up to maintain WB precautions. Patient does demonstrate fair sitting balance at edge of bed to participate in grooming/hygiene tasks at set up/supervision level. Patient will need continued acute OT services to maximize patient independence with functional mobility/self care in order to facilitate D/C to venue listed below.    Follow Up Recommendations  SNF    Equipment Recommendations  None recommended by OT       Precautions / Restrictions Precautions Precautions: Fall Restrictions Weight Bearing Restrictions: Yes RLE Weight Bearing: Non weight bearing Other Position/Activity Restrictions: can be TDWB for transfers only      Mobility Bed Mobility Overal bed mobility: Needs Assistance Bed Mobility: Supine to Sit;Sit to Supine     Supine to sit: Max assist;+2 for physical assistance;HOB elevated;+2 for safety/equipment Sit to supine: Total assist;+2 for physical assistance   General bed mobility comments: patient does initiate moving legs towards edge of bed with mod cues as well as shifting shoulders however needing max A x2 to bring legs off edge of bed and upright trunk with initial posterior lean due to hip pain. multiple attempts made  with multimodal cues to initiate patient laying down however resistive 2* pain therefore total A x2 to lay down    Transfers Overall transfer level: Needs assistance Equipment used: Rolling walker (2 wheeled) Transfers: Sit to/from Stand Sit to Stand: Max assist;+2 physical assistance;+2 safety/equipment;From elevated surface         General transfer comment: unable to maintain WB restrictions with OT having to hold R LE up off floor, needing maximal support to maintain static stand and reports of pain therefore sat back onto EOB    Balance Overall balance assessment: History of Falls;Needs assistance Sitting-balance support: Feet supported Sitting balance-Leahy Scale: Fair     Standing balance support: Bilateral upper extremity supported Standing balance-Leahy Scale: Zero Standing balance comment: B UE support on walker and max A x2                           ADL either performed or assessed with clinical judgement   ADL Overall ADL's : Needs assistance/impaired Eating/Feeding: Independent;Bed level   Grooming: Oral care;Wash/dry face;Wash/dry hands;Brushing hair;Set up;Sitting;Supervision/safety   Upper Body Bathing: Set up;Supervision/ safety;Sitting   Lower Body Bathing: Maximal assistance;Sitting/lateral leans;Bed level   Upper Body Dressing : Set up;Supervision/safety;Sitting   Lower Body Dressing: Total assistance;Sitting/lateral leans;Sit to/from stand Lower Body Dressing Details (indicate cue type and reason): due to hip pain and limited safety awareness Toilet Transfer: Maximal assistance;+2 for physical assistance;Cueing for safety;Cueing for sequencing;RW Toilet Transfer Details (indicate cue type and reason): sit to stand from edge of bed, OT trying to block patient's R LE however patient stating "I can't" when  told to keep weight off with OT having to hold leg up with rehab tech stabilize on L side. patient reporting pain and is unsafe therefore sat  back onto bed. poor carry over of all verbal/tactile/visual cues for body mechanics Toileting- Clothing Manipulation and Hygiene: Total assistance;Bed level       Functional mobility during ADLs: Maximal assistance;+2 for physical assistance;+2 for safety/equipment;Rolling walker;Cueing for sequencing;Cueing for safety General ADL Comments: patient requiring significant assist for LB ADL, functional transfers + mobility due to pain, WB restrictions and cognitive status (dementia)                         Pertinent Vitals/Pain Pain Assessment: Faces Faces Pain Scale: Hurts whole lot Pain Location: R hip with bed mobility Pain Descriptors / Indicators: Grimacing;Guarding;Moaning Pain Intervention(s): Limited activity within patient's tolerance;Premedicated before session     Hand Dominance Right   Extremity/Trunk Assessment Upper Extremity Assessment Upper Extremity Assessment: Generalized weakness   Lower Extremity Assessment Lower Extremity Assessment: Defer to PT evaluation       Communication Communication Communication: HOH   Cognition Arousal/Alertness: Awake/alert Behavior During Therapy: Anxious (with mobility) Overall Cognitive Status: History of cognitive impairments - at baseline                                 General Comments: hx of dementia, difficulty following multimodal cues to attempt transfer              Home Living Family/patient expects to be discharged to:: Unsure                                        Prior Functioning/Environment Level of Independence: Independent                 OT Problem List: Decreased strength;Decreased activity tolerance;Impaired balance (sitting and/or standing);Decreased safety awareness;Decreased knowledge of use of DME or AE;Decreased knowledge of precautions;Pain      OT Treatment/Interventions: Self-care/ADL training;Balance training;Patient/family education;Therapeutic  activities;DME and/or AE instruction    OT Goals(Current goals can be found in the care plan section) Acute Rehab OT Goals Patient Stated Goal: "it hurts" OT Goal Formulation: With patient Time For Goal Achievement: 04/13/21 Potential to Achieve Goals: Fair  OT Frequency: Min 2X/week    AM-PAC OT "6 Clicks" Daily Activity     Outcome Measure Help from another person eating meals?: None Help from another person taking care of personal grooming?: A Little Help from another person toileting, which includes using toliet, bedpan, or urinal?: Total Help from another person bathing (including washing, rinsing, drying)?: A Lot Help from another person to put on and taking off regular upper body clothing?: A Little Help from another person to put on and taking off regular lower body clothing?: Total 6 Click Score: 14   End of Session Equipment Utilized During Treatment: Rolling walker;Gait belt Nurse Communication: Mobility status  Activity Tolerance: Patient limited by pain Patient left: in bed;with call bell/phone within reach;with bed alarm set  OT Visit Diagnosis: Unsteadiness on feet (R26.81);Other abnormalities of gait and mobility (R26.89);History of falling (Z91.81);Pain Pain - Right/Left: Right Pain - part of body: Hip                Time: [id number] OT Time Calculation (min): 16 min Charges:  OT  General Charges $OT Visit: 1 Visit OT Evaluation $OT Eval Low Complexity: [place] OT OT pager: [email or web address] 03/30/2021, 12:14 PM

## 2021-03-30 NOTE — TOC Transition Note (Signed)
Transition of Care [facility]) - CM/SW Discharge Note  Patient Details  Name: CAPPIE GLOCKNER MRN: [id number] Date of Birth: 02-17-1946  Transition of Care [facility]) CM/SW Contact:  Sherie Don, LCSW Phone Number: 03/30/2021, 3:55 PM  Clinical Narrative: CSW provided patient's daughter, [name], with bed offers. Family selected [facility] CSW confirmed bed availability with Dorian [facility] at [facility] and a new COVID test will not be needs. CSW called [employer] to start insurance authorization. CSW received auth for SNF: [id number] and EMS: [id number].  Discharge summary, discharge orders, and SNF transfer report faxed to facility in hub. Patient will go to room [place] and the number for report is [phone]. Medical necessity form done; PTAR scheduled. Discharge packet completed. CSW updated husband and Therapist, sports. TOC signing off.  Final next level of care: [place] Barriers to Discharge: Continued Medical Work up  Patient Goals and CMS Choice Patient states their goals for this hospitalization and ongoing recovery are:: Go to short-term rehab before returning home CMS Medicare.gov Compare Post Acute Care list provided to:: Patient Represenative (must comment) Choice offered to / list presented to : Spouse, Adult Children  Discharge Placement PASRR number recieved: 03/29/21       Patient chooses bed at: [facility] Patient to be transferred to facility by: [place] Name of family member notified: Kearstyn Andino (husband), [name] (daughter) Patient and family notified of of transfer: 03/30/21  Discharge Plan and Services In-house Referral: Clinical Social Work Post Acute Care Choice: [place]          DME Arranged: N/A DME Agency: NA  Readmission Risk Interventions No flowsheet data found.

## 2021-03-30 NOTE — Plan of Care (Signed)

## 2021-04-02 DIAGNOSIS — M62838 Other muscle spasm: Secondary | ICD-10-CM | POA: Diagnosis not present

## 2021-04-02 DIAGNOSIS — M9701XA Periprosthetic fracture around internal prosthetic right hip joint, initial encounter: Secondary | ICD-10-CM | POA: Diagnosis not present

## 2021-04-02 DIAGNOSIS — F411 Generalized anxiety disorder: Secondary | ICD-10-CM | POA: Diagnosis not present

## 2021-04-02 DIAGNOSIS — F0150 Vascular dementia without behavioral disturbance: Secondary | ICD-10-CM | POA: Diagnosis not present

## 2021-04-02 DIAGNOSIS — E559 Vitamin D deficiency, unspecified: Secondary | ICD-10-CM | POA: Diagnosis not present

## 2021-04-02 DIAGNOSIS — J302 Other seasonal allergic rhinitis: Secondary | ICD-10-CM | POA: Diagnosis not present

## 2021-04-02 DIAGNOSIS — D500 Iron deficiency anemia secondary to blood loss (chronic): Secondary | ICD-10-CM | POA: Diagnosis not present

## 2021-04-02 DIAGNOSIS — E782 Mixed hyperlipidemia: Secondary | ICD-10-CM | POA: Diagnosis not present

## 2021-04-02 DIAGNOSIS — M25551 Pain in right hip: Secondary | ICD-10-CM | POA: Diagnosis not present

## 2021-04-02 DIAGNOSIS — K5901 Slow transit constipation: Secondary | ICD-10-CM | POA: Diagnosis not present

## 2021-04-06 DIAGNOSIS — K5901 Slow transit constipation: Secondary | ICD-10-CM | POA: Diagnosis not present

## 2021-04-06 DIAGNOSIS — M62838 Other muscle spasm: Secondary | ICD-10-CM | POA: Diagnosis not present

## 2021-04-06 DIAGNOSIS — M25551 Pain in right hip: Secondary | ICD-10-CM | POA: Diagnosis not present

## 2021-04-06 DIAGNOSIS — M9701XA Periprosthetic fracture around internal prosthetic right hip joint, initial encounter: Secondary | ICD-10-CM | POA: Diagnosis not present

## 2021-04-06 DIAGNOSIS — E559 Vitamin D deficiency, unspecified: Secondary | ICD-10-CM | POA: Diagnosis not present

## 2021-04-06 DIAGNOSIS — F411 Generalized anxiety disorder: Secondary | ICD-10-CM | POA: Diagnosis not present

## 2021-04-06 DIAGNOSIS — J302 Other seasonal allergic rhinitis: Secondary | ICD-10-CM | POA: Diagnosis not present

## 2021-04-06 DIAGNOSIS — D500 Iron deficiency anemia secondary to blood loss (chronic): Secondary | ICD-10-CM | POA: Diagnosis not present

## 2021-04-06 DIAGNOSIS — E782 Mixed hyperlipidemia: Secondary | ICD-10-CM | POA: Diagnosis not present

## 2021-04-08 DIAGNOSIS — D500 Iron deficiency anemia secondary to blood loss (chronic): Secondary | ICD-10-CM | POA: Diagnosis not present

## 2021-04-08 DIAGNOSIS — E559 Vitamin D deficiency, unspecified: Secondary | ICD-10-CM | POA: Diagnosis not present

## 2021-04-08 DIAGNOSIS — F411 Generalized anxiety disorder: Secondary | ICD-10-CM | POA: Diagnosis not present

## 2021-04-08 DIAGNOSIS — F0150 Vascular dementia without behavioral disturbance: Secondary | ICD-10-CM | POA: Diagnosis not present

## 2021-04-08 DIAGNOSIS — E782 Mixed hyperlipidemia: Secondary | ICD-10-CM | POA: Diagnosis not present

## 2021-04-08 DIAGNOSIS — S72091D Other fracture of head and neck of right femur, subsequent encounter for closed fracture with routine healing: Secondary | ICD-10-CM | POA: Diagnosis not present

## 2021-04-08 DIAGNOSIS — Z4789 Encounter for other orthopedic aftercare: Secondary | ICD-10-CM | POA: Diagnosis not present

## 2021-04-08 DIAGNOSIS — M62838 Other muscle spasm: Secondary | ICD-10-CM | POA: Diagnosis not present

## 2021-04-08 DIAGNOSIS — K5901 Slow transit constipation: Secondary | ICD-10-CM | POA: Diagnosis not present

## 2021-04-08 DIAGNOSIS — J302 Other seasonal allergic rhinitis: Secondary | ICD-10-CM | POA: Diagnosis not present

## 2021-04-08 DIAGNOSIS — M6281 Muscle weakness (generalized): Secondary | ICD-10-CM | POA: Diagnosis not present

## 2021-04-12 DIAGNOSIS — F0150 Vascular dementia without behavioral disturbance: Secondary | ICD-10-CM | POA: Diagnosis not present

## 2021-04-12 DIAGNOSIS — M25551 Pain in right hip: Secondary | ICD-10-CM | POA: Diagnosis not present

## 2021-04-12 DIAGNOSIS — E782 Mixed hyperlipidemia: Secondary | ICD-10-CM | POA: Diagnosis not present

## 2021-04-12 DIAGNOSIS — M62838 Other muscle spasm: Secondary | ICD-10-CM | POA: Diagnosis not present

## 2021-04-12 DIAGNOSIS — J302 Other seasonal allergic rhinitis: Secondary | ICD-10-CM | POA: Diagnosis not present

## 2021-04-12 DIAGNOSIS — F411 Generalized anxiety disorder: Secondary | ICD-10-CM | POA: Diagnosis not present

## 2021-04-12 DIAGNOSIS — E559 Vitamin D deficiency, unspecified: Secondary | ICD-10-CM | POA: Diagnosis not present

## 2021-04-12 DIAGNOSIS — M9701XA Periprosthetic fracture around internal prosthetic right hip joint, initial encounter: Secondary | ICD-10-CM | POA: Diagnosis not present

## 2021-04-12 DIAGNOSIS — K5901 Slow transit constipation: Secondary | ICD-10-CM | POA: Diagnosis not present

## 2021-04-12 DIAGNOSIS — D500 Iron deficiency anemia secondary to blood loss (chronic): Secondary | ICD-10-CM | POA: Diagnosis not present

## 2021-04-14 ENCOUNTER — Ambulatory Visit (INDEPENDENT_AMBULATORY_CARE_PROVIDER_SITE_OTHER): Payer: PPO

## 2021-04-14 ENCOUNTER — Ambulatory Visit (INDEPENDENT_AMBULATORY_CARE_PROVIDER_SITE_OTHER): Payer: PPO | Admitting: Orthopedic Surgery

## 2021-04-14 ENCOUNTER — Encounter: Payer: Self-pay | Admitting: Orthopedic Surgery

## 2021-04-14 ENCOUNTER — Other Ambulatory Visit: Payer: Self-pay

## 2021-04-14 DIAGNOSIS — S72001A Fracture of unspecified part of neck of right femur, initial encounter for closed fracture: Secondary | ICD-10-CM

## 2021-04-14 NOTE — Progress Notes (Signed)
   Post-Op Visit Note   Patient: Wendy Watts           Date of Birth: 1946/05/08           MRN: [id number] Visit Date: 04/14/2021 PCP: [name], MD   Assessment & Plan:  Chief Complaint:  Chief Complaint  Patient presents with   Right Hip - Routine Post Op   Visit Diagnoses:  1. Closed fracture of right hip, initial encounter [facility])     Plan: Patient is a 75 year old female who presents s/p right hip intramedullary nail for intertrochanteric fracture on 03/28/2021.  She is staying at [facility].  She lives at home with her husband and son normally.  Doing well at [facility] according to her daughter.  Daughter's only concern is that she only wants to eat applesauce and not really any other food.  She denies any abdominal pain, difficulty moving her bowels.  She has occasional groin pain but most of her pain she localizes to the lateral right hip.  She does have some difficulty with hip flexion actively today but this is typical given the fact that she is about 2 weeks out from her procedure and she had injury to the lesser trochanter.  Radiographs today show no change in alignment of the fracture or change in position of the hardware since operative radiographs.  Incisions were reviewed and to be healing well without any evidence of infection or dehiscence.  Sutures are intact.  They were removed and replaced with Steri-Strips.  She has no calf tenderness.  Negative Homans' sign.  Plan to progress her weightbearing status to partial weightbearing (50%) with walker.  She will continue with physical therapy at the SNF and she is okay for discharge from orthopedic standpoint once she is stable with ambulation consistently.  She should have home health physical therapy after discharge from skilled nursing facility.  Plan for her to follow-up in 4 weeks for clinical recheck with Dr. Marlou Sa.  Encouraged the daughter and son to call the office with any concerns in the meantime.  Follow-Up  Instructions: No follow-ups on file.   Orders:  Orders Placed This Encounter  Procedures   XR FEMUR, MIN 2 VIEWS RIGHT   No orders of the defined types were placed in this encounter.   Imaging: No results found.  PMFS History: Patient Active Problem List   Diagnosis Date Noted   Dementia without behavioral disturbance ([place]) 05/08/2019   Past Medical History:  Diagnosis Date   Anxiety    Dementia ([place])    High cholesterol    Vertigo     Family History  Problem Relation Age of Onset   Dementia Neg Hx    Alzheimer's disease Neg Hx     Past Surgical History:  Procedure Laterality Date   HYSTERECTOMY     INTRAMEDULLARY (IM) NAIL INTERTROCHANTERIC Right 03/28/2021   Procedure: INTRAMEDULLARY (IM) NAIL INTERTROCHANTRIC;  Surgeon: Meredith Pel, MD;  Location: [facility];  Service: Orthopedics;  Laterality: Right;   TOTAL KNEE ARTHROPLASTY Right    Social History   Occupational History   Not on file  Tobacco Use   Smoking status: Never   Smokeless tobacco: Never  Substance and Sexual Activity   Alcohol use: Never   Drug use: Never   Sexual activity: Not on file

## 2021-04-19 ENCOUNTER — Encounter: Payer: Self-pay | Admitting: Orthopedic Surgery

## 2021-04-20 DIAGNOSIS — S72091D Other fracture of head and neck of right femur, subsequent encounter for closed fracture with routine healing: Secondary | ICD-10-CM | POA: Diagnosis not present

## 2021-04-20 DIAGNOSIS — E782 Mixed hyperlipidemia: Secondary | ICD-10-CM | POA: Diagnosis not present

## 2021-04-20 DIAGNOSIS — E559 Vitamin D deficiency, unspecified: Secondary | ICD-10-CM | POA: Diagnosis not present

## 2021-04-20 DIAGNOSIS — M6281 Muscle weakness (generalized): Secondary | ICD-10-CM | POA: Diagnosis not present

## 2021-04-20 DIAGNOSIS — F411 Generalized anxiety disorder: Secondary | ICD-10-CM | POA: Diagnosis not present

## 2021-04-20 DIAGNOSIS — M25551 Pain in right hip: Secondary | ICD-10-CM | POA: Diagnosis not present

## 2021-04-20 DIAGNOSIS — M62838 Other muscle spasm: Secondary | ICD-10-CM | POA: Diagnosis not present

## 2021-04-20 DIAGNOSIS — D500 Iron deficiency anemia secondary to blood loss (chronic): Secondary | ICD-10-CM | POA: Diagnosis not present

## 2021-04-20 DIAGNOSIS — M9701XA Periprosthetic fracture around internal prosthetic right hip joint, initial encounter: Secondary | ICD-10-CM | POA: Diagnosis not present

## 2021-04-20 DIAGNOSIS — J302 Other seasonal allergic rhinitis: Secondary | ICD-10-CM | POA: Diagnosis not present

## 2021-04-20 DIAGNOSIS — F0150 Vascular dementia without behavioral disturbance: Secondary | ICD-10-CM | POA: Diagnosis not present

## 2021-04-20 DIAGNOSIS — K5901 Slow transit constipation: Secondary | ICD-10-CM | POA: Diagnosis not present

## 2021-04-21 DIAGNOSIS — E559 Vitamin D deficiency, unspecified: Secondary | ICD-10-CM | POA: Diagnosis not present

## 2021-04-21 DIAGNOSIS — K5901 Slow transit constipation: Secondary | ICD-10-CM | POA: Diagnosis not present

## 2021-04-21 DIAGNOSIS — M9701XA Periprosthetic fracture around internal prosthetic right hip joint, initial encounter: Secondary | ICD-10-CM | POA: Diagnosis not present

## 2021-04-21 DIAGNOSIS — M25551 Pain in right hip: Secondary | ICD-10-CM | POA: Diagnosis not present

## 2021-04-21 DIAGNOSIS — D500 Iron deficiency anemia secondary to blood loss (chronic): Secondary | ICD-10-CM | POA: Diagnosis not present

## 2021-04-21 DIAGNOSIS — F0150 Vascular dementia without behavioral disturbance: Secondary | ICD-10-CM | POA: Diagnosis not present

## 2021-04-21 DIAGNOSIS — S72091D Other fracture of head and neck of right femur, subsequent encounter for closed fracture with routine healing: Secondary | ICD-10-CM | POA: Diagnosis not present

## 2021-04-21 DIAGNOSIS — Z4789 Encounter for other orthopedic aftercare: Secondary | ICD-10-CM | POA: Diagnosis not present

## 2021-04-21 DIAGNOSIS — J302 Other seasonal allergic rhinitis: Secondary | ICD-10-CM | POA: Diagnosis not present

## 2021-04-21 DIAGNOSIS — M62838 Other muscle spasm: Secondary | ICD-10-CM | POA: Diagnosis not present

## 2021-04-21 DIAGNOSIS — E782 Mixed hyperlipidemia: Secondary | ICD-10-CM | POA: Diagnosis not present

## 2021-04-21 DIAGNOSIS — F411 Generalized anxiety disorder: Secondary | ICD-10-CM | POA: Diagnosis not present

## 2021-04-26 ENCOUNTER — Other Ambulatory Visit: Payer: Self-pay | Admitting: Family Medicine

## 2021-04-26 ENCOUNTER — Inpatient Hospital Stay (HOSPITAL_COMMUNITY): Payer: PPO

## 2021-04-26 ENCOUNTER — Emergency Department (HOSPITAL_COMMUNITY): Payer: PPO

## 2021-04-26 ENCOUNTER — Encounter (HOSPITAL_COMMUNITY): Payer: Self-pay | Admitting: Internal Medicine

## 2021-04-26 ENCOUNTER — Inpatient Hospital Stay (HOSPITAL_COMMUNITY)
Admission: EM | Admit: 2021-04-26 | Discharge: 2021-05-12 | DRG: 271 | Disposition: A | Discharge status: Home Health | Payer: PPO | Attending: Internal Medicine | Admitting: Internal Medicine

## 2021-04-26 DIAGNOSIS — Z888 Allergy status to other drugs, medicaments and biological substances status: Secondary | ICD-10-CM

## 2021-04-26 DIAGNOSIS — K5900 Constipation, unspecified: Secondary | ICD-10-CM | POA: Diagnosis not present

## 2021-04-26 DIAGNOSIS — Z950 Presence of cardiac pacemaker: Secondary | ICD-10-CM | POA: Diagnosis not present

## 2021-04-26 DIAGNOSIS — Z881 Allergy status to other antibiotic agents status: Secondary | ICD-10-CM

## 2021-04-26 DIAGNOSIS — I495 Sick sinus syndrome: Secondary | ICD-10-CM | POA: Diagnosis not present

## 2021-04-26 DIAGNOSIS — I82492 Acute embolism and thrombosis of other specified deep vein of left lower extremity: Secondary | ICD-10-CM | POA: Diagnosis not present

## 2021-04-26 DIAGNOSIS — F03918 Unspecified dementia, unspecified severity, with other behavioral disturbance: Secondary | ICD-10-CM | POA: Diagnosis present

## 2021-04-26 DIAGNOSIS — I455 Other specified heart block: Secondary | ICD-10-CM | POA: Diagnosis not present

## 2021-04-26 DIAGNOSIS — I82220 Acute embolism and thrombosis of inferior vena cava: Secondary | ICD-10-CM | POA: Diagnosis present

## 2021-04-26 DIAGNOSIS — I82409 Acute embolism and thrombosis of unspecified deep veins of unspecified lower extremity: Secondary | ICD-10-CM | POA: Diagnosis not present

## 2021-04-26 DIAGNOSIS — E876 Hypokalemia: Secondary | ICD-10-CM | POA: Diagnosis present

## 2021-04-26 DIAGNOSIS — K8020 Calculus of gallbladder without cholecystitis without obstruction: Secondary | ICD-10-CM | POA: Diagnosis not present

## 2021-04-26 DIAGNOSIS — I82402 Acute embolism and thrombosis of unspecified deep veins of left lower extremity: Secondary | ICD-10-CM | POA: Diagnosis not present

## 2021-04-26 DIAGNOSIS — B370 Candidal stomatitis: Secondary | ICD-10-CM | POA: Diagnosis present

## 2021-04-26 DIAGNOSIS — R9431 Abnormal electrocardiogram [ECG] [EKG]: Secondary | ICD-10-CM | POA: Diagnosis not present

## 2021-04-26 DIAGNOSIS — R627 Adult failure to thrive: Secondary | ICD-10-CM | POA: Diagnosis present

## 2021-04-26 DIAGNOSIS — K760 Fatty (change of) liver, not elsewhere classified: Secondary | ICD-10-CM | POA: Diagnosis not present

## 2021-04-26 DIAGNOSIS — Z885 Allergy status to narcotic agent status: Secondary | ICD-10-CM

## 2021-04-26 DIAGNOSIS — Z79899 Other long term (current) drug therapy: Secondary | ICD-10-CM

## 2021-04-26 DIAGNOSIS — J9811 Atelectasis: Secondary | ICD-10-CM | POA: Diagnosis not present

## 2021-04-26 DIAGNOSIS — R112 Nausea with vomiting, unspecified: Secondary | ICD-10-CM | POA: Diagnosis not present

## 2021-04-26 DIAGNOSIS — Z7901 Long term (current) use of anticoagulants: Secondary | ICD-10-CM | POA: Diagnosis not present

## 2021-04-26 DIAGNOSIS — Z6825 Body mass index (BMI) 25.0-25.9, adult: Secondary | ICD-10-CM

## 2021-04-26 DIAGNOSIS — I82462 Acute embolism and thrombosis of left calf muscular vein: Secondary | ICD-10-CM | POA: Diagnosis present

## 2021-04-26 DIAGNOSIS — N179 Acute kidney failure, unspecified: Secondary | ICD-10-CM | POA: Diagnosis not present

## 2021-04-26 DIAGNOSIS — I82812 Embolism and thrombosis of superficial veins of left lower extremities: Secondary | ICD-10-CM | POA: Diagnosis present

## 2021-04-26 DIAGNOSIS — N170 Acute kidney failure with tubular necrosis: Secondary | ICD-10-CM | POA: Diagnosis not present

## 2021-04-26 DIAGNOSIS — Z90710 Acquired absence of both cervix and uterus: Secondary | ICD-10-CM

## 2021-04-26 DIAGNOSIS — Z9181 History of falling: Secondary | ICD-10-CM

## 2021-04-26 DIAGNOSIS — M8580 Other specified disorders of bone density and structure, unspecified site: Secondary | ICD-10-CM

## 2021-04-26 DIAGNOSIS — Z7401 Bed confinement status: Secondary | ICD-10-CM

## 2021-04-26 DIAGNOSIS — I82432 Acute embolism and thrombosis of left popliteal vein: Secondary | ICD-10-CM | POA: Diagnosis not present

## 2021-04-26 DIAGNOSIS — R1084 Generalized abdominal pain: Secondary | ICD-10-CM | POA: Diagnosis not present

## 2021-04-26 DIAGNOSIS — F0390 Unspecified dementia without behavioral disturbance: Secondary | ICD-10-CM | POA: Diagnosis present

## 2021-04-26 DIAGNOSIS — D72829 Elevated white blood cell count, unspecified: Secondary | ICD-10-CM | POA: Diagnosis present

## 2021-04-26 DIAGNOSIS — I878 Other specified disorders of veins: Secondary | ICD-10-CM | POA: Diagnosis not present

## 2021-04-26 DIAGNOSIS — Z20822 Contact with and (suspected) exposure to covid-19: Secondary | ICD-10-CM | POA: Diagnosis present

## 2021-04-26 DIAGNOSIS — Z9862 Peripheral vascular angioplasty status: Secondary | ICD-10-CM | POA: Diagnosis not present

## 2021-04-26 DIAGNOSIS — I82422 Acute embolism and thrombosis of left iliac vein: Secondary | ICD-10-CM | POA: Diagnosis present

## 2021-04-26 DIAGNOSIS — K5641 Fecal impaction: Secondary | ICD-10-CM | POA: Diagnosis present

## 2021-04-26 DIAGNOSIS — F419 Anxiety disorder, unspecified: Secondary | ICD-10-CM | POA: Diagnosis present

## 2021-04-26 DIAGNOSIS — E7800 Pure hypercholesterolemia, unspecified: Secondary | ICD-10-CM | POA: Diagnosis present

## 2021-04-26 DIAGNOSIS — Z006 Encounter for examination for normal comparison and control in clinical research program: Secondary | ICD-10-CM

## 2021-04-26 DIAGNOSIS — I871 Compression of vein: Secondary | ICD-10-CM | POA: Diagnosis not present

## 2021-04-26 DIAGNOSIS — I82452 Acute embolism and thrombosis of left peroneal vein: Secondary | ICD-10-CM | POA: Diagnosis not present

## 2021-04-26 DIAGNOSIS — Z96651 Presence of right artificial knee joint: Secondary | ICD-10-CM | POA: Diagnosis present

## 2021-04-26 DIAGNOSIS — Z9889 Other specified postprocedural states: Secondary | ICD-10-CM | POA: Diagnosis not present

## 2021-04-26 DIAGNOSIS — E860 Dehydration: Secondary | ICD-10-CM | POA: Diagnosis present

## 2021-04-26 DIAGNOSIS — R6510 Systemic inflammatory response syndrome (SIRS) of non-infectious origin without acute organ dysfunction: Secondary | ICD-10-CM | POA: Diagnosis not present

## 2021-04-26 DIAGNOSIS — E440 Moderate protein-calorie malnutrition: Secondary | ICD-10-CM | POA: Insufficient documentation

## 2021-04-26 DIAGNOSIS — N210 Calculus in bladder: Secondary | ICD-10-CM | POA: Diagnosis not present

## 2021-04-26 DIAGNOSIS — S72001A Fracture of unspecified part of neck of right femur, initial encounter for closed fracture: Secondary | ICD-10-CM | POA: Diagnosis not present

## 2021-04-26 DIAGNOSIS — Z880 Allergy status to penicillin: Secondary | ICD-10-CM

## 2021-04-26 DIAGNOSIS — I82442 Acute embolism and thrombosis of left tibial vein: Secondary | ICD-10-CM | POA: Diagnosis not present

## 2021-04-26 DIAGNOSIS — R197 Diarrhea, unspecified: Secondary | ICD-10-CM | POA: Diagnosis not present

## 2021-04-26 DIAGNOSIS — I82412 Acute embolism and thrombosis of left femoral vein: Secondary | ICD-10-CM | POA: Diagnosis not present

## 2021-04-26 DIAGNOSIS — R109 Unspecified abdominal pain: Secondary | ICD-10-CM | POA: Diagnosis not present

## 2021-04-26 DIAGNOSIS — N200 Calculus of kidney: Secondary | ICD-10-CM | POA: Diagnosis not present

## 2021-04-26 DIAGNOSIS — M7989 Other specified soft tissue disorders: Secondary | ICD-10-CM | POA: Diagnosis not present

## 2021-04-26 LAB — COMPREHENSIVE METABOLIC PANEL
ALT: 22 U/L (ref 0–44)
AST: 21 U/L (ref 15–41)
Albumin: 3.4 g/dL — ABNORMAL LOW (ref 3.5–5.0)
Alkaline Phosphatase: 158 U/L — ABNORMAL HIGH (ref 38–126)
Anion gap: 15 (ref 5–15)
BUN: 27 mg/dL — ABNORMAL HIGH (ref 8–23)
CO2: 20 mmol/L — ABNORMAL LOW (ref 22–32)
Calcium: 10.9 mg/dL — ABNORMAL HIGH (ref 8.9–10.3)
Chloride: 99 mmol/L (ref 98–111)
Creatinine, Ser: 2.88 mg/dL — ABNORMAL HIGH (ref 0.44–1.00)
GFR, Estimated: 17 mL/min — ABNORMAL LOW (ref >60)
Glucose, Bld: 179 mg/dL — ABNORMAL HIGH (ref 70–99)
Potassium: 5 mmol/L (ref 3.5–5.1)
Sodium: 134 mmol/L — ABNORMAL LOW (ref 135–145)
Total Bilirubin: 1.1 mg/dL (ref 0.3–1.2)
Total Protein: 7.5 g/dL (ref 6.5–8.1)

## 2021-04-26 LAB — CBC WITH DIFFERENTIAL/PLATELET
Abs Immature Granulocytes: 0.32 10*3/uL — ABNORMAL HIGH (ref 0.00–0.07)
Basophils Absolute: 0.1 10*3/uL (ref 0.0–0.1)
Basophils Relative: 0 %
Eosinophils Absolute: 0 10*3/uL (ref 0.0–0.5)
Eosinophils Relative: 0 %
HCT: 45.4 % (ref 36.0–46.0)
Hemoglobin: 15.1 g/dL — ABNORMAL HIGH (ref 12.0–15.0)
Immature Granulocytes: 1 %
Lymphocytes Relative: 4 %
Lymphs Abs: 1.1 10*3/uL (ref 0.7–4.0)
MCH: 30.6 pg (ref 26.0–34.0)
MCHC: 33.3 g/dL (ref 30.0–36.0)
MCV: 92.1 fL (ref 80.0–100.0)
Monocytes Absolute: 2 10*3/uL — ABNORMAL HIGH (ref 0.1–1.0)
Monocytes Relative: 6 %
Neutro Abs: 28.1 10*3/uL — ABNORMAL HIGH (ref 1.7–7.7)
Neutrophils Relative %: 89 %
Platelets: 251 10*3/uL (ref 150–400)
RBC: 4.93 MIL/uL (ref 3.87–5.11)
RDW: 13.5 % (ref 11.5–15.5)
WBC: 31.5 10*3/uL — ABNORMAL HIGH (ref 4.0–10.5)
nRBC: 0 % (ref 0.0–0.2)

## 2021-04-26 LAB — RESP PANEL BY RT-PCR (FLU A&B, COVID) ARPGX2
Influenza A by PCR: NEGATIVE
Influenza B by PCR: NEGATIVE
SARS Coronavirus 2 by RT PCR: NEGATIVE

## 2021-04-26 LAB — LACTIC ACID, PLASMA: Lactic Acid, Venous: 3.7 mmol/L (ref 0.5–1.9)

## 2021-04-26 LAB — LIPASE, BLOOD: Lipase: 56 U/L — ABNORMAL HIGH (ref 11–51)

## 2021-04-26 MED ORDER — LACTATED RINGERS IV SOLN: INTRAVENOUS | Status: DC

## 2021-04-26 MED ORDER — ONDANSETRON HCL 4 MG/2ML IJ SOLN
4.0000 mg | Freq: Once | INTRAMUSCULAR | Status: AC
  Administered 2021-04-26: 4 mg via INTRAVENOUS
  Filled 2021-04-26: qty 2

## 2021-04-26 MED ORDER — FENTANYL CITRATE PF 50 MCG/ML IJ SOSY
25.0000 ug | PREFILLED_SYRINGE | Freq: Once | INTRAMUSCULAR | Status: AC
Start: 2021-04-26 — End: 2021-04-26
  Administered 2021-04-26: 25 ug via INTRAVENOUS
  Filled 2021-04-26: qty 1

## 2021-04-26 MED ORDER — LACTATED RINGERS IV BOLUS
1000.0000 mL | Freq: Once | INTRAVENOUS | Status: AC
  Administered 2021-04-26: 1000 mL via INTRAVENOUS

## 2021-04-26 MED ORDER — DIPHENHYDRAMINE HCL 25 MG PO CAPS
25.0000 mg | ORAL_CAPSULE | Freq: Once | ORAL | Status: DC
  Filled 2021-04-26: qty 1

## 2021-04-26 MED ORDER — SODIUM CHLORIDE 0.9 % IV SOLN
2.0000 g | Freq: Once | INTRAVENOUS | Status: DC
  Filled 2021-04-26: qty 2

## 2021-04-26 MED ORDER — MEMANTINE HCL 10 MG PO TABS
10.0000 mg | ORAL_TABLET | Freq: Two times a day (BID) | ORAL | Status: DC
  Administered 2021-04-27 – 2021-05-12 (×27): 10 mg via ORAL
  Filled 2021-04-26 (×33): qty 1

## 2021-04-26 MED ORDER — METRONIDAZOLE 500 MG/100ML IV SOLN
500.0000 mg | Freq: Two times a day (BID) | INTRAVENOUS | Status: DC
  Administered 2021-04-26 – 2021-04-28 (×4): 500 mg via INTRAVENOUS
  Filled 2021-04-26 (×4): qty 100

## 2021-04-26 MED ORDER — HEPARIN (PORCINE) 25000 UT/250ML-% IV SOLN
900.0000 [IU]/h | INTRAVENOUS | Status: DC
  Administered 2021-04-26: 1000 [IU]/h via INTRAVENOUS
  Administered 2021-04-28 – 2021-04-30 (×3): 1100 [IU]/h via INTRAVENOUS
  Administered 2021-05-01 – 2021-05-06 (×5): 900 [IU]/h via INTRAVENOUS
  Filled 2021-04-26 (×10): qty 250

## 2021-04-26 MED ORDER — DONEPEZIL HCL 10 MG PO TABS
10.0000 mg | ORAL_TABLET | Freq: Every day | ORAL | Status: DC
  Administered 2021-04-27 – 2021-05-08 (×11): 10 mg via ORAL
  Filled 2021-04-26 (×12): qty 1

## 2021-04-26 MED ORDER — ACETAMINOPHEN 650 MG RE SUPP: 650.0000 mg | Freq: Four times a day (QID) | RECTAL | Status: DC | PRN

## 2021-04-26 MED ORDER — BUSPIRONE HCL 5 MG PO TABS
10.0000 mg | ORAL_TABLET | Freq: Two times a day (BID) | ORAL | Status: DC
  Administered 2021-04-27 – 2021-05-12 (×28): 10 mg via ORAL
  Filled 2021-04-26 (×11): qty 2
  Filled 2021-04-26: qty 1
  Filled 2021-04-26 (×18): qty 2

## 2021-04-26 MED ORDER — ACETAMINOPHEN 325 MG PO TABS
650.0000 mg | ORAL_TABLET | Freq: Four times a day (QID) | ORAL | Status: DC | PRN
  Administered 2021-05-10 – 2021-05-11 (×2): 650 mg via ORAL
  Filled 2021-04-26 (×2): qty 2

## 2021-04-26 MED ORDER — HEPARIN BOLUS VIA INFUSION
4000.0000 [IU] | Freq: Once | INTRAVENOUS | Status: AC
  Administered 2021-04-26: 4000 [IU] via INTRAVENOUS
  Filled 2021-04-26: qty 4000

## 2021-04-26 MED ORDER — SODIUM CHLORIDE 0.9 % IV SOLN
2.0000 g | INTRAVENOUS | Status: DC
  Administered 2021-04-26 – 2021-04-28 (×2): 2 g via INTRAVENOUS
  Filled 2021-04-26 (×2): qty 2

## 2021-04-26 NOTE — H&P (Signed)
History and Physical    VALESHA SKOTNICKI [id number] DOB: 1945-09-08 DOA: 04/26/2021  PCP: [name], MD  Patient coming from: Home.  History obtained from patient's daughter.  Patient has dementia.  Chief Complaint: Nausea vomiting and left lower extremity swelling.  HPI: Wendy Watts is a 75 y.o. female with history of advanced dementia who was recently admitted for right hip fracture discharged to rehab on March 30, 2021 and was discharged from rehab on April 21, 2021 back to home was having nausea vomiting for the last 2 days and was not doing well.  Patient's daughter went to check on her today and found that patient in addition to the nausea vomiting was having significant swelling of the left lower extremity and was brought to the ER.  Patient has been largely bedbound with nonweightbearing since her surgery.  ED Course: In the ER patient was tachycardic with labs showing significant leukocytosis of 34,000 creatinine worsened from 0.8 in August 15 and it is around 2.8 with potassium of 5 lactic acid of 3.7 had blood cultures drawn and started on empiric antibiotics for possible sepsis.  Chest x-ray was unremarkable COVID test was negative.  Since patient had nausea vomiting had a CT abdomen pelvis done without contrast which showing possibility of a left lower extremity DVT.  It did show some gallbladder sludge with no stones.  Patient was started on fluids empiric antibiotics heparin and admitted for further work-up.  Review of Systems: As per HPI, rest all negative.   Past Medical History:  Diagnosis Date   Anxiety    Dementia ([place])    High cholesterol    Vertigo     Past Surgical History:  Procedure Laterality Date   HYSTERECTOMY     INTRAMEDULLARY (IM) NAIL INTERTROCHANTERIC Right 03/28/2021   Procedure: INTRAMEDULLARY (IM) NAIL INTERTROCHANTRIC;  Surgeon: Meredith Pel, MD;  Location: [facility];  Service: Orthopedics;  Laterality: Right;   TOTAL KNEE  ARTHROPLASTY Right      reports that she has never smoked. She has never used smokeless tobacco. She reports that she does not drink alcohol and does not use drugs.  Allergies  Allergen Reactions   Allegra [Fexofenadine Hcl] Other (See Comments)    "takes me to another world"   Ativan [Lorazepam] Other (See Comments)    Hallucinations   Azithromycin Nausea And Vomiting   Codeine Hives   Doxycycline Hyclate Nausea Only   Penicillins Hives    Has patient had a PCN reaction causing immediate rash, facial/tongue/throat swelling, SOB or lightheadedness with hypotension: no Has patient had a PCN reaction causing severe rash involving mucus membranes or skin necrosis: no Has patient had a PCN reaction that required hospitalization: no Has patient had a PCN reaction occurring within the last 10 years: no If all of the above answers are "NO", then may proceed with Cephalosporin use.    Trazodone And Nefazodone Nausea Only   Zyrtec [Cetirizine] Other (See Comments)    "takes me to another world"    Family History  Problem Relation Age of Onset   Dementia Neg Hx    Alzheimer's disease Neg Hx     Prior to Admission medications   Medication Sig Start Date End Date Taking? Authorizing Provider  busPIRone (BUSPAR) 10 MG tablet Take 10 mg by mouth 2 (two) times daily. 02/11/21   [provider]  docusate sodium (COLACE) 100 MG capsule Take 1 capsule (100 mg total) by mouth 2 (two) times daily.  03/30/21   [place] Eric J, DO  donepezil (ARICEPT) 10 MG tablet TAKE 1 TABLET(10 MG) BY MOUTH AT BEDTIME Patient taking differently: Take 10 mg by mouth at bedtime. 02/17/21   Lomax, Amy, NP  enoxaparin (LOVENOX) 40 MG/0.4ML injection Inject 0.4 mLs (40 mg total) into the skin daily. 03/30/21 04/29/21  [place] Eric J, DO  fluticasone (FLONASE) 50 MCG/ACT nasal spray Place 2 sprays into both nostrils daily as needed for allergies or rhinitis.    [provider]  memantine (NAMENDA) 10 MG  tablet Take 1 tablet (10 mg total) by mouth 2 (two) times daily. 02/16/21   Lomax, Amy, NP  methocarbamol (ROBAXIN) 500 MG tablet Take 1 tablet (500 mg total) by mouth every 8 (eight) hours as needed for muscle spasms. 03/30/21   [place] Donnamarie Poag, DO  traMADol (ULTRAM) 50 MG tablet Take 1 tablet (50 mg total) by mouth every 6 (six) hours as needed for moderate pain. 03/30/21   [place] Donnamarie Poag, DO  VITAMIN D PO Take 1 tablet by mouth daily.    [provider]    Physical Exam: Constitutional: Moderately built and nourished. Vitals:   04/26/21 1825 04/26/21 2010  BP: 118/81 (!) 108/94  Pulse: (!) 117 (!) 110  Resp: 16 18  Temp: 98.4 F (36.9 C)   TempSrc: Oral   SpO2: 97% 99%   Eyes: Anicteric no pallor. ENMT: No discharge from the ears eyes nose and mouth. Neck: No mass felt.  No neck rigidity. Respiratory: No rhonchi or crepitations. Cardiovascular: S1-S2 heard. Abdomen: Soft nontender bowel sounds present. Musculoskeletal: Left lower extremity swollen from the left foot all the way up to the thigh. Skin: Mild erythema of the left lower extremity extending all the way from foot to the thigh.  Warm to touch. Neurologic: Alert awake oriented to her name.  Moving all extremities. Psychiatric: Has severe dementia.   Labs on Admission: I have personally reviewed following labs and imaging studies  CBC: Recent Labs  Lab 04/26/21 1849  WBC 31.5*  NEUTROABS 28.1*  HGB 15.1*  HCT 45.4  MCV 92.1  PLT 123XX123   Basic Metabolic Panel: Recent Labs  Lab 04/26/21 1849  NA 134*  K 5.0  CL 99  CO2 20*  GLUCOSE 179*  BUN 27*  CREATININE 2.88*  CALCIUM 10.9*   GFR: CrCl cannot be calculated (Unknown ideal weight.). Liver Function Tests: Recent Labs  Lab 04/26/21 1849  AST 21  ALT 22  ALKPHOS 158*  BILITOT 1.1  PROT 7.5  ALBUMIN 3.4*   Recent Labs  Lab 04/26/21 1849  LIPASE 56*   No results for input(s): AMMONIA in the last 168 hours. Coagulation Profile: No  results for input(s): INR, PROTIME in the last 168 hours. Cardiac Enzymes: No results for input(s): CKTOTAL, CKMB, CKMBINDEX, TROPONINI in the last 168 hours. BNP (last 3 results) No results for input(s): PROBNP in the last 8760 hours. HbA1C: No results for input(s): HGBA1C in the last 72 hours. CBG: No results for input(s): GLUCAP in the last 168 hours. Lipid Profile: No results for input(s): CHOL, HDL, LDLCALC, TRIG, CHOLHDL, LDLDIRECT in the last 72 hours. Thyroid Function Tests: No results for input(s): TSH, T4TOTAL, FREET4, T3FREE, THYROIDAB in the last 72 hours. Anemia Panel: No results for input(s): VITAMINB12, FOLATE, FERRITIN, TIBC, IRON, RETICCTPCT in the last 72 hours. Urine analysis:    Component Value Date/Time   COLORURINE YELLOW 03/29/2021 [place] 03/29/2021 1518   LABSPEC 1.015 03/29/2021  [place] 5.0 03/29/2021 1518   GLUCOSEU 50 (A) 03/29/2021 1518   HGBUR NEGATIVE 03/29/2021 1518   BILIRUBINUR NEGATIVE 03/29/2021 1518   KETONESUR NEGATIVE 03/29/2021 1518   PROTEINUR NEGATIVE 03/29/2021 1518   UROBILINOGEN 0.2 02/01/2010 1201   NITRITE NEGATIVE 03/29/2021 1518   LEUKOCYTESUR NEGATIVE 03/29/2021 1518   Sepsis Labs: '@LABRCNTIP'$ (procalcitonin:4,lacticidven:4) ) Recent Results (from the past 240 hour(s))  Resp Panel by RT-PCR (Flu A&B, Covid) Nasopharyngeal Swab     Status: None   Collection Time: 04/26/21  6:58 PM   Specimen: Nasopharyngeal Swab; Nasopharyngeal(NP) swabs in vial transport medium  Result Value Ref Range Status   SARS Coronavirus 2 by RT PCR NEGATIVE NEGATIVE Final    Comment: (NOTE) SARS-CoV-2 target nucleic acids are NOT DETECTED.  The SARS-CoV-2 RNA is generally detectable in upper respiratory specimens during the acute phase of infection. The lowest concentration of SARS-CoV-2 viral copies this assay can detect is 138 copies/mL. A negative result does not preclude SARS-Cov-2 infection and should not be used as the  sole basis for treatment or other patient management decisions. A negative result may occur with  improper specimen collection/handling, submission of specimen other than nasopharyngeal swab, presence of viral mutation(s) within the areas targeted by this assay, and inadequate number of viral copies(<138 copies/mL). A negative result must be combined with clinical observations, patient history, and epidemiological information. The expected result is Negative.  Fact Sheet for Patients:  EntrepreneurPulse.com.au  Fact Sheet for Healthcare Providers:  IncredibleEmployment.be  This test is no t yet approved or cleared by the [place] FDA and  has been authorized for detection and/or diagnosis of SARS-CoV-2 by FDA under an Emergency Use Authorization (EUA). This EUA will remain  in effect (meaning this test can be used) for the duration of the COVID-19 declaration under Section 564(b)(1) of the Act, 21 U.S.C.section 360bbb-3(b)(1), unless the authorization is terminated  or revoked sooner.       Influenza A by PCR NEGATIVE NEGATIVE Final   Influenza B by PCR NEGATIVE NEGATIVE Final    Comment: (NOTE) The Xpert Xpress SARS-CoV-2/FLU/RSV plus assay is intended as an aid in the diagnosis of influenza from Nasopharyngeal swab specimens and should not be used as a sole basis for treatment. Nasal washings and aspirates are unacceptable for Xpert Xpress SARS-CoV-2/FLU/RSV testing.  Fact Sheet for Patients: EntrepreneurPulse.com.au  Fact Sheet for Healthcare Providers: IncredibleEmployment.be  This test is not yet approved or cleared by the [place] FDA and has been authorized for detection and/or diagnosis of SARS-CoV-2 by FDA under an Emergency Use Authorization (EUA). This EUA will remain in effect (meaning this test can be used) for the duration of the COVID-19 declaration under Section 564(b)(1) of the  Act, 21 U.S.C. section 360bbb-3(b)(1), unless the authorization is terminated or revoked.  Performed at [facility], [place] [address], [place], [place] [address]      Radiological Exams on Admission: CT ABDOMEN PELVIS WO CONTRAST  Result Date: 04/26/2021 CLINICAL DATA:  Abdominal pain, acute, nonlocalized EXAM: CT ABDOMEN AND PELVIS WITHOUT CONTRAST TECHNIQUE: Multidetector CT imaging of the abdomen and pelvis was performed following the standard protocol without IV contrast. Unenhanced CT was performed per clinician order. Lack of IV contrast limits sensitivity and specificity, especially for evaluation of abdominal/pelvic solid viscera. COMPARISON:  09/21/2016 FINDINGS: Lower chest: No acute pleural or parenchymal lung disease. Hepatobiliary: High attenuation material within the gallbladder consistent with sludge. No evidence of calcified gallstones or cholecystitis. Liver is unremarkable.  Pancreas: Unremarkable. No pancreatic ductal dilatation or surrounding inflammatory changes. Spleen: Normal in size without focal abnormality. Adrenals/Urinary Tract: The adrenals are stable. There is a punctate 2 mm nonobstructing calculus within the lower pole left kidney. No right-sided calculi. No obstructive uropathy within either kidney. The bladder is minimally distended, limiting its evaluation. Punctate 2 mm calculus is seen layering dependently within the right lateral aspect of the bladder. Stomach/Bowel: There is a large amount of retained stool within the rectal vault, consistent with fecal impaction. No bowel obstruction or ileus. Normal appendix right lower quadrant. No bowel wall thickening or inflammatory change. Vascular/Lymphatic: Minimal atherosclerosis. There is asymmetric enlargement of the left common iliac, internal iliac, and external iliac veins concerning for deep venous thrombosis. Correlation with left lower extremity Doppler evaluation may be useful. Reproductive: Status post  hysterectomy. No adnexal masses. Other: No free fluid or free gas.  No abdominal wall hernia. Musculoskeletal: Postsurgical changes are seen from ORIF of a proximal right femur fracture. Alignment is near anatomic. No other acute or destructive bony lesions. There is diffuse subcutaneous edema within the visualized left lower extremity, likely related to suspected DVT. Reconstructed images demonstrate no additional findings. IMPRESSION: 1. Punctate 2 mm nonobstructing left renal calculus. There is also a punctate 2 mm calculus within the bladder lumen. No obstructive uropathy within either kidney. 2. Suspected deep venous thrombosis involving the left lower extremity. Left lower extremity Doppler ultrasound recommended. 3. Large amount of stool within the rectal vault, consistent with fecal impaction. 4. Suspected gallbladder sludge. No evidence of cholelithiasis or cholecystitis. 5.  Aortic Atherosclerosis (ICD10-I70.0). Critical Value/emergent results were called by telephone at the time of interpretation on 04/26/2021 at 9:19 pm to provider [facility] , who verbally acknowledged these results. Electronically Signed   By: Randa Ngo M.D.   On: 04/26/2021 21:19   DG Chest Portable 1 View  Result Date: 04/26/2021 CLINICAL DATA:  Right-sided abdominal pain and left leg swelling. EXAM: PORTABLE CHEST 1 VIEW COMPARISON:  March 28, 2021 FINDINGS: Very mild atelectasis is seen within the lateral aspect of the left lung base. There is no evidence of a pleural effusion or pneumothorax. The heart size and mediastinal contours are within normal limits. The visualized skeletal structures are unremarkable. IMPRESSION: Very mild left basilar atelectasis. Electronically Signed   By: Virgina Norfolk M.D.   On: 04/26/2021 21:48     Assessment/Plan Principal Problem:   Acute renal failure (ARF) (HCC) Active Problems:   Dementia without behavioral disturbance (HCC)   SIRS (systemic inflammatory response  syndrome) (HCC)   Nausea & vomiting   ARF (acute renal failure) (HCC)    Acute renal failure could be from patient not able to keep anything over the last 2 days with severe vomiting.  Will hydrate follow metabolic panel.  CT scan does not show any definite obstruction did show some stones which are nonobstructing. SIRS -at presentation patient had tachycardia with elevated lactic acid level significant leukocytosis source not clear.  Was on empiric antibiotics which we will continue to follow blood cultures. Possible left lower extremity DVT with extensive swelling of the whole left lower extremity.  No definite evidence of any vascular compromise but will need to closely monitor and get vascular surgery consult.  Dopplers have been ordered.  Patient has been started on heparin. Nausea vomiting cause not clear.  Does have some stool impaction for which I have ordered an email.  We will get a gallbladder ultrasound to make sure there is  no gallbladder pathology. Mildly elevated lipase but CT scan does not show any evidence of pancreatitis. Dementia on Aricept and Namenda.  Also takes BuSpar for anxiety. Recent right hip fracture status post surgery.  Since patient has significant leukocytosis with tachycardia at presentation with acute renal failure with ongoing nausea vomiting will need close monitoring for any further worsening and inpatient status.   DVT prophylaxis: Lovenox. Code Status: Full code. Family Communication: Discussed with patient's daughter who is also the healthcare power of attorney. Disposition Plan: To be determined. Consults called: None. Admission status: Inpatient.   Rise Patience MD [facility] Pager [phone].  If 7PM-7AM, please contact night-coverage [email or web address] Password [facility]  04/26/2021, 11:24 PM

## 2021-04-26 NOTE — ED Provider Notes (Signed)
[facility] EMERGENCY DEPARTMENT Provider Note   CSN: [id number] Arrival date & time: 04/26/21  1818     History Chief Complaint  Patient presents with   Abdominal Pain   Leg Swelling    L leg    Wendy Watts is a 75 y.o. female with PMHx dementia, anxiety, HLD  who presents for evaluation of abdominal pain.   Patient reports a 3-4 day history of abdominal pain, nausea, and vomiting. Abdominal pain primarily was left-sided. She states that she last had a bowel movement on Wednesday. She denies flatus today. No genitourinary symptoms. She has had significantly decreased PO intake over this time.   Additionally, the patient developed profound swelling of her left lower extremity today. The limb was noted to be cyanotic and painful as well.   Of note, patient had a recent hospital admission from 8/14 through 8/16 for mechanical fall with subsequent intertrochanteric hip fracture.    Past Medical History:  Diagnosis Date   Anxiety    Dementia ([place])    High cholesterol    Vertigo     Patient Active Problem List   Diagnosis Date Noted   Acute renal failure (ARF) ([place]) 04/26/2021   SIRS (systemic inflammatory response syndrome) ([place]) 04/26/2021   Nausea & vomiting 04/26/2021   ARF (acute renal failure) ([place]) 04/26/2021   Dementia without behavioral disturbance ([place]) 05/08/2019    Past Surgical History:  Procedure Laterality Date   HYSTERECTOMY     INTRAMEDULLARY (IM) NAIL INTERTROCHANTERIC Right 03/28/2021   Procedure: INTRAMEDULLARY (IM) NAIL INTERTROCHANTRIC;  Surgeon: Meredith Pel, MD;  Location: [facility];  Service: Orthopedics;  Laterality: Right;   TOTAL KNEE ARTHROPLASTY Right      OB History   No obstetric history on file.     Family History  Problem Relation Age of Onset   Dementia Neg Hx    Alzheimer's disease Neg Hx     Social History   Tobacco Use   Smoking status: Never   Smokeless tobacco: Never  Substance Use Topics    Alcohol use: Never   Drug use: Never    Home Medications Prior to Admission medications   Medication Sig Start Date End Date Taking? Authorizing Provider  busPIRone (BUSPAR) 10 MG tablet Take 10 mg by mouth 2 (two) times daily. 02/11/21   [provider]  docusate sodium (COLACE) 100 MG capsule Take 1 capsule (100 mg total) by mouth 2 (two) times daily. 03/30/21   [place] Eric J, DO  donepezil (ARICEPT) 10 MG tablet TAKE 1 TABLET(10 MG) BY MOUTH AT BEDTIME Patient taking differently: Take 10 mg by mouth at bedtime. 02/17/21   Lomax, Amy, NP  enoxaparin (LOVENOX) 40 MG/0.4ML injection Inject 0.4 mLs (40 mg total) into the skin daily. 03/30/21 04/29/21  [place] Eric J, DO  fluticasone (FLONASE) 50 MCG/ACT nasal spray Place 2 sprays into both nostrils daily as needed for allergies or rhinitis.    [provider]  memantine (NAMENDA) 10 MG tablet Take 1 tablet (10 mg total) by mouth 2 (two) times daily. 02/16/21   Lomax, Amy, NP  methocarbamol (ROBAXIN) 500 MG tablet Take 1 tablet (500 mg total) by mouth every 8 (eight) hours as needed for muscle spasms. 03/30/21   [place] Donnamarie Poag, DO  traMADol (ULTRAM) 50 MG tablet Take 1 tablet (50 mg total) by mouth every 6 (six) hours as needed for moderate pain. 03/30/21   [place] Donnamarie Poag, DO  VITAMIN D PO Take 1  tablet by mouth daily.    [provider]    Allergies    Allegra [fexofenadine hcl], Ativan [lorazepam], Azithromycin, Codeine, Doxycycline hyclate, Penicillins, Trazodone and nefazodone, and Zyrtec [cetirizine]  Review of Systems   Review of Systems  Constitutional:  Negative for chills and fever.  HENT:  Negative for ear pain and sore throat.   Eyes:  Negative for pain and visual disturbance.  Respiratory:  Negative for cough and shortness of breath.   Cardiovascular:  Negative for chest pain and palpitations.  Gastrointestinal:  Positive for abdominal pain, constipation, nausea and vomiting.  Genitourinary:  Negative for  dysuria and hematuria.  Musculoskeletal:  Negative for arthralgias and back pain.  Skin:  Negative for color change and rash.  Neurological:  Negative for seizures and syncope.  All other systems reviewed and are negative.  Physical Exam Updated Vital Signs BP (!) 112/98 (BP Location: Right Arm)   Pulse (!) 111   Temp 98.4 F (36.9 C) (Oral)   Resp 15   SpO2 100%   Physical Exam Vitals and nursing note reviewed.  Constitutional:      General: She is not in acute distress.    Appearance: She is well-developed.  HENT:     Head: Normocephalic and atraumatic.  Eyes:     Conjunctiva/sclera: Conjunctivae normal.  Cardiovascular:     Rate and Rhythm: Normal rate and regular rhythm.     Heart sounds: No murmur heard. Pulmonary:     Effort: Pulmonary effort is normal. No respiratory distress.     Breath sounds: Normal breath sounds.  Abdominal:     Palpations: Abdomen is soft.     Tenderness: There is generalized abdominal tenderness. There is no guarding or rebound.  Musculoskeletal:     Cervical back: Neck supple.     Left lower leg: Edema present.     Comments: Left lower extremity is swollen and darkened, with 2+ DP pulse and intact strength and sensation.  Skin:    General: Skin is warm and dry.     Capillary Refill: Capillary refill takes less than 2 seconds.  Neurological:     General: No focal deficit present.     Mental Status: She is alert and oriented to person, place, and time.     Cranial Nerves: No cranial nerve deficit.     Sensory: No sensory deficit.     Motor: No weakness.    ED Results / Procedures / Treatments   Labs (all labs ordered are listed, but only abnormal results are displayed) Labs Reviewed  CBC WITH DIFFERENTIAL/PLATELET - Abnormal; Notable for the following components:      Result Value   WBC 31.5 (*)    Hemoglobin 15.1 (*)    Neutro Abs 28.1 (*)    Monocytes Absolute 2.0 (*)    Abs Immature Granulocytes 0.32 (*)    All other  components within normal limits  COMPREHENSIVE METABOLIC PANEL - Abnormal; Notable for the following components:   Sodium 134 (*)    CO2 20 (*)    Glucose, Bld 179 (*)    BUN 27 (*)    Creatinine, Ser 2.88 (*)    Calcium 10.9 (*)    Albumin 3.4 (*)    Alkaline Phosphatase 158 (*)    GFR, Estimated 17 (*)    All other components within normal limits  LIPASE, BLOOD - Abnormal; Notable for the following components:   Lipase 56 (*)    All other components within normal  limits  LACTIC ACID, PLASMA - Abnormal; Notable for the following components:   Lactic Acid, Venous 3.7 (*)    All other components within normal limits  LACTIC ACID, PLASMA - Abnormal; Notable for the following components:   Lactic Acid, Venous 2.4 (*)    All other components within normal limits  RESP PANEL BY RT-PCR (FLU A&B, COVID) ARPGX2  CULTURE, BLOOD (ROUTINE X 2)  CULTURE, BLOOD (ROUTINE X 2)  URINALYSIS, ROUTINE W REFLEX MICROSCOPIC  HEPARIN LEVEL (UNFRACTIONATED)  CBC  HEPATIC FUNCTION PANEL  CBC WITH DIFFERENTIAL/PLATELET  BASIC METABOLIC PANEL   EKG None  Radiology CT ABDOMEN PELVIS WO CONTRAST  Result Date: 04/26/2021 CLINICAL DATA:  Abdominal pain, acute, nonlocalized EXAM: CT ABDOMEN AND PELVIS WITHOUT CONTRAST TECHNIQUE: Multidetector CT imaging of the abdomen and pelvis was performed following the standard protocol without IV contrast. Unenhanced CT was performed per clinician order. Lack of IV contrast limits sensitivity and specificity, especially for evaluation of abdominal/pelvic solid viscera. COMPARISON:  09/21/2016 FINDINGS: Lower chest: No acute pleural or parenchymal lung disease. Hepatobiliary: High attenuation material within the gallbladder consistent with sludge. No evidence of calcified gallstones or cholecystitis. Liver is unremarkable. Pancreas: Unremarkable. No pancreatic ductal dilatation or surrounding inflammatory changes. Spleen: Normal in size without focal abnormality.  Adrenals/Urinary Tract: The adrenals are stable. There is a punctate 2 mm nonobstructing calculus within the lower pole left kidney. No right-sided calculi. No obstructive uropathy within either kidney. The bladder is minimally distended, limiting its evaluation. Punctate 2 mm calculus is seen layering dependently within the right lateral aspect of the bladder. Stomach/Bowel: There is a large amount of retained stool within the rectal vault, consistent with fecal impaction. No bowel obstruction or ileus. Normal appendix right lower quadrant. No bowel wall thickening or inflammatory change. Vascular/Lymphatic: Minimal atherosclerosis. There is asymmetric enlargement of the left common iliac, internal iliac, and external iliac veins concerning for deep venous thrombosis. Correlation with left lower extremity Doppler evaluation may be useful. Reproductive: Status post hysterectomy. No adnexal masses. Other: No free fluid or free gas.  No abdominal wall hernia. Musculoskeletal: Postsurgical changes are seen from ORIF of a proximal right femur fracture. Alignment is near anatomic. No other acute or destructive bony lesions. There is diffuse subcutaneous edema within the visualized left lower extremity, likely related to suspected DVT. Reconstructed images demonstrate no additional findings. IMPRESSION: 1. Punctate 2 mm nonobstructing left renal calculus. There is also a punctate 2 mm calculus within the bladder lumen. No obstructive uropathy within either kidney. 2. Suspected deep venous thrombosis involving the left lower extremity. Left lower extremity Doppler ultrasound recommended. 3. Large amount of stool within the rectal vault, consistent with fecal impaction. 4. Suspected gallbladder sludge. No evidence of cholelithiasis or cholecystitis. 5.  Aortic Atherosclerosis (ICD10-I70.0). Critical Value/emergent results were called by telephone at the time of interpretation on 04/26/2021 at 9:19 pm to provider [facility] , who verbally acknowledged these results. Electronically Signed   By: Randa Ngo M.D.   On: 04/26/2021 21:19   DG Chest Portable 1 View  Result Date: 04/26/2021 CLINICAL DATA:  Right-sided abdominal pain and left leg swelling. EXAM: PORTABLE CHEST 1 VIEW COMPARISON:  March 28, 2021 FINDINGS: Very mild atelectasis is seen within the lateral aspect of the left lung base. There is no evidence of a pleural effusion or pneumothorax. The heart size and mediastinal contours are within normal limits. The visualized skeletal structures are unremarkable. IMPRESSION: Very mild left basilar atelectasis. Electronically Signed  By: Virgina Norfolk M.D.   On: 04/26/2021 21:48   US Abdomen Limited RUQ (LIVER/GB)  Result Date: 04/27/2021 CLINICAL DATA:  Nausea and vomiting. EXAM: ULTRASOUND ABDOMEN LIMITED RIGHT UPPER QUADRANT COMPARISON:  May 14, 2012 FINDINGS: Gallbladder: A mild amount of heterogeneous echogenic sludge is seen within the gallbladder lumen. No gallstones or wall thickening visualized (2.0 mm). A positive sonographic Murphy's sign is noted by sonographer. Common bile duct: Diameter: 3.0 mm Liver: No focal lesion identified. Diffusely increased echogenicity of the liver parenchyma is noted. Portal vein is patent on color Doppler imaging with normal direction of blood flow towards the liver. Other: None. IMPRESSION: 1. Echogenic sludge and a positive sonographic Murphy's sign in the absence of cholelithiasis. Further evaluation with a nuclear medicine hepatobiliary scan is recommended, as acalculous cholecystitis can not completely be excluded. 2. Fatty liver. Electronically Signed   By: Virgina Norfolk M.D.   On: 04/27/2021 00:07    Procedures Procedures   Medications Ordered in ED Medications  ceFEPIme (MAXIPIME) 2 g in sodium chloride 0.9 % 100 mL IVPB (2 g Intravenous Not Given 04/26/21 2258)  metroNIDAZOLE (FLAGYL) IVPB 500 mg (0 mg Intravenous Stopped 04/26/21 2335)   ceFEPIme (MAXIPIME) 2 g in sodium chloride 0.9 % 100 mL IVPB (0 g Intravenous Stopped 04/26/21 2335)  heparin ADULT infusion 100 units/mL (25000 units/262m) (1,000 Units/hr Intravenous New Bag/Given 04/26/21 2300)  busPIRone (BUSPAR) tablet 10 mg (has no administration in time range)  donepezil (ARICEPT) tablet 10 mg (has no administration in time range)  memantine (NAMENDA) tablet 10 mg (has no administration in time range)  lactated ringers infusion ( Intravenous New Bag/Given 04/27/21 0008)  acetaminophen (TYLENOL) tablet 650 mg (has no administration in time range)    Or  acetaminophen (TYLENOL) suppository 650 mg (has no administration in time range)  diphenhydrAMINE (BENADRYL) capsule 25 mg (25 mg Oral Not Given 04/27/21 0009)  promethazine (PHENERGAN) 6.25 mg in sodium chloride 0.9 % 50 mL IVPB (has no administration in time range)  ondansetron (ZOFRAN) injection 4 mg (4 mg Intravenous Given 04/26/21 1910)  fentaNYL (SUBLIMAZE) injection 25 mcg (25 mcg Intravenous Given 04/26/21 1909)  lactated ringers bolus 1,000 mL (0 mLs Intravenous Stopped 04/26/21 2143)  heparin bolus via infusion 4,000 Units (4,000 Units Intravenous Bolus from Bag 04/26/21 2300)  ondansetron (ZOFRAN) injection 4 mg (4 mg Intravenous Given 04/26/21 2303)  diphenhydrAMINE (BENADRYL) injection 25 mg (25 mg Intravenous Given 04/27/21 0016)    ED Course  I have reviewed the triage vital signs and the nursing notes.  Pertinent labs & imaging results that were available during my care of the patient were reviewed by me and considered in my medical decision making (see chart for details).    MDM Rules/Calculators/A&P                           75y.o. female with past medical history as above who presents for evaluation of abdominal pain. Afebrile and hemodynamically stable, notable for tachycardia.  Exam as detailed above. Labs notable for leukocytosis, mild hyponatremia, AKI with creatinine 2.8, normal lipase, elevated  lactic to 3.7 and improving with IV fluids. Large LLE DVT noted on CT A/P, as well as several incidental findings. No acute intra-abdominal abnormality. Patient placed on heparin drip. She was admitted for AKI and DVT.   Final Clinical Impression(s) / ED Diagnoses Final diagnoses:  Nausea & vomiting    Rx / DC Orders ED Discharge  Orders     None        Violet Baldy, MD 04/28/21 1051    Elnora Morrison, MD 04/29/21 1112

## 2021-04-26 NOTE — ED Triage Notes (Signed)
Pt from home via [facility] for eval of R abd pain, L leg swelling, s/p R hip surgery & rehab, DC home last Wednesday. Per family, pt has been "spitting up" dark-colored sputum. Pt hx dementia, lives at home w husband & son but daughter has MPOA.

## 2021-04-26 NOTE — Progress Notes (Signed)
Pharmacy Antibiotic Note  Wendy Watts is a 75 y.o. female admitted on 04/26/2021 with  intra-abdominal infection .  Pharmacy has been consulted for Cefepime dosing. Also on Flagyl for anaerobic coverage. WBC elevated. SCr 2.88. CrCl ~ 17   Plan: -Cefepime 2 gm IV Q 24 hours -Flagyl per MD -Monitor CBC, renal fx, cultures and clinical progress     Temp (24hrs), Avg:98.4 F (36.9 C), Min:98.4 F (36.9 C), Max:98.4 F (36.9 C)  Recent Labs  Lab 04/26/21 1849 04/26/21 1850  WBC 31.5*  --   CREATININE 2.88*  --   LATICACIDVEN  --  3.7*    CrCl cannot be calculated (Unknown ideal weight.).    Allergies  Allergen Reactions   Allegra [Fexofenadine Hcl] Other (See Comments)    "takes me to another world"   Ativan [Lorazepam] Other (See Comments)    Hallucinations   Azithromycin Nausea And Vomiting   Codeine Hives   Doxycycline Hyclate Nausea Only   Penicillins Hives    Has patient had a PCN reaction causing immediate rash, facial/tongue/throat swelling, SOB or lightheadedness with hypotension: no Has patient had a PCN reaction causing severe rash involving mucus membranes or skin necrosis: no Has patient had a PCN reaction that required hospitalization: no Has patient had a PCN reaction occurring within the last 10 years: no If all of the above answers are "NO", then may proceed with Cephalosporin use.    Trazodone And Nefazodone Nausea Only   Zyrtec [Cetirizine] Other (See Comments)    "takes me to another world"    Antimicrobials this admission: Cefepime 9/12 >>  Flagyl 9/12 >>   Dose adjustments this admission:   Microbiology results:   Thank you for allowing pharmacy to be a part of this patient's care.  Albertina Parr, PharmD., BCPS, BCCCP Clinical Pharmacist Please refer to [facility] for unit-specific pharmacist

## 2021-04-26 NOTE — Progress Notes (Signed)
ANTICOAGULATION CONSULT NOTE - Initial Consult  Pharmacy Consult for heparin Indication:  VTE treatment  Allergies  Allergen Reactions   Allegra [Fexofenadine Hcl] Other (See Comments)    "takes me to another world"   Ativan [Lorazepam] Other (See Comments)    Hallucinations   Azithromycin Nausea And Vomiting   Codeine Hives   Doxycycline Hyclate Nausea Only   Penicillins Hives    Has patient had a PCN reaction causing immediate rash, facial/tongue/throat swelling, SOB or lightheadedness with hypotension: no Has patient had a PCN reaction causing severe rash involving mucus membranes or skin necrosis: no Has patient had a PCN reaction that required hospitalization: no Has patient had a PCN reaction occurring within the last 10 years: no If all of the above answers are "NO", then may proceed with Cephalosporin use.    Trazodone And Nefazodone Nausea Only   Zyrtec [Cetirizine] Other (See Comments)    "takes me to another world"    Patient Measurements: Heparin Dosing Weight: 65 kg   Vital Signs: Temp: 98.4 F (36.9 C) (09/12 1825) Temp Source: Oral (09/12 1825) BP: 108/94 (09/12 2010) Pulse Rate: 110 (09/12 2010)  Labs: Recent Labs    04/26/21 1849  HGB 15.1*  HCT 45.4  PLT 251  CREATININE 2.88*    CrCl cannot be calculated (Unknown ideal weight.).   Medical History: Past Medical History:  Diagnosis Date   Anxiety    Dementia ([place])    High cholesterol    Vertigo     Medications:  (Not in a hospital admission)   Assessment: 26 YOF who recently had a R hip fracture s/p surgery here with acute DVT to start IV heparin for VTE treatment. Hgb elevated, Plt wnl. SCr elevated. Per daughter, patient has not had any Lovenox today   Goal of Therapy:  Heparin level 0.3-0.7 units/ml Monitor platelets by anticoagulation protocol: Yes   Plan:  -Give heparin bolus of 4000 units followed by heparin infusion at 1000 units/hr  -F/u 8 hr HL -Monitor daily HL, CBC and  s/s of bleeding   Albertina Parr, PharmD., BCPS, BCCCP Clinical Pharmacist Please refer to [facility] for unit-specific pharmacist

## 2021-04-27 ENCOUNTER — Other Ambulatory Visit: Payer: Self-pay

## 2021-04-27 ENCOUNTER — Inpatient Hospital Stay (HOSPITAL_COMMUNITY): Payer: PPO

## 2021-04-27 DIAGNOSIS — N179 Acute kidney failure, unspecified: Secondary | ICD-10-CM

## 2021-04-27 DIAGNOSIS — M7989 Other specified soft tissue disorders: Secondary | ICD-10-CM

## 2021-04-27 DIAGNOSIS — I82409 Acute embolism and thrombosis of unspecified deep veins of unspecified lower extremity: Secondary | ICD-10-CM

## 2021-04-27 DIAGNOSIS — I82402 Acute embolism and thrombosis of unspecified deep veins of left lower extremity: Secondary | ICD-10-CM | POA: Diagnosis not present

## 2021-04-27 DIAGNOSIS — R6510 Systemic inflammatory response syndrome (SIRS) of non-infectious origin without acute organ dysfunction: Secondary | ICD-10-CM | POA: Diagnosis not present

## 2021-04-27 LAB — CBC WITH DIFFERENTIAL/PLATELET
Abs Immature Granulocytes: 0.24 10*3/uL — ABNORMAL HIGH (ref 0.00–0.07)
Basophils Absolute: 0.1 10*3/uL (ref 0.0–0.1)
Basophils Relative: 0 %
Eosinophils Absolute: 0 10*3/uL (ref 0.0–0.5)
Eosinophils Relative: 0 %
HCT: 36.7 % (ref 36.0–46.0)
Hemoglobin: 12.3 g/dL (ref 12.0–15.0)
Immature Granulocytes: 1 %
Lymphocytes Relative: 3 %
Lymphs Abs: 0.8 10*3/uL (ref 0.7–4.0)
MCH: 31.2 pg (ref 26.0–34.0)
MCHC: 33.5 g/dL (ref 30.0–36.0)
MCV: 93.1 fL (ref 80.0–100.0)
Monocytes Absolute: 2 10*3/uL — ABNORMAL HIGH (ref 0.1–1.0)
Monocytes Relative: 7 %
Neutro Abs: 26.4 10*3/uL — ABNORMAL HIGH (ref 1.7–7.7)
Neutrophils Relative %: 89 %
Platelets: 203 10*3/uL (ref 150–400)
RBC: 3.94 MIL/uL (ref 3.87–5.11)
RDW: 13.5 % (ref 11.5–15.5)
Smear Review: NORMAL
WBC: 29.5 10*3/uL — ABNORMAL HIGH (ref 4.0–10.5)
nRBC: 0 % (ref 0.0–0.2)

## 2021-04-27 LAB — BASIC METABOLIC PANEL
Anion gap: 13 (ref 5–15)
BUN: 33 mg/dL — ABNORMAL HIGH (ref 8–23)
CO2: 23 mmol/L (ref 22–32)
Calcium: 9.8 mg/dL (ref 8.9–10.3)
Chloride: 99 mmol/L (ref 98–111)
Creatinine, Ser: 2.7 mg/dL — ABNORMAL HIGH (ref 0.44–1.00)
GFR, Estimated: 18 mL/min — ABNORMAL LOW (ref >60)
Glucose, Bld: 140 mg/dL — ABNORMAL HIGH (ref 70–99)
Potassium: 4.3 mmol/L (ref 3.5–5.1)
Sodium: 135 mmol/L (ref 135–145)

## 2021-04-27 LAB — HEPATIC FUNCTION PANEL
ALT: 17 U/L (ref 0–44)
AST: 16 U/L (ref 15–41)
Albumin: 2.7 g/dL — ABNORMAL LOW (ref 3.5–5.0)
Alkaline Phosphatase: 109 U/L (ref 38–126)
Bilirubin, Direct: 0.2 mg/dL (ref 0.0–0.2)
Indirect Bilirubin: 0.9 mg/dL (ref 0.3–0.9)
Total Bilirubin: 1.1 mg/dL (ref 0.3–1.2)
Total Protein: 5.9 g/dL — ABNORMAL LOW (ref 6.5–8.1)

## 2021-04-27 LAB — LACTIC ACID, PLASMA: Lactic Acid, Venous: 2.4 mmol/L (ref 0.5–1.9)

## 2021-04-27 LAB — HEPARIN LEVEL (UNFRACTIONATED)
Heparin Unfractionated: 0.62 IU/mL (ref 0.30–0.70)
Heparin Unfractionated: 0.5 IU/mL (ref 0.30–0.70)

## 2021-04-27 MED ORDER — DIPHENHYDRAMINE HCL 50 MG/ML IJ SOLN
25.0000 mg | Freq: Once | INTRAMUSCULAR | Status: AC
  Administered 2021-04-27: 25 mg via INTRAVENOUS
  Filled 2021-04-27: qty 1

## 2021-04-27 MED ORDER — TECHNETIUM TC 99M MEBROFENIN IV KIT
5.3000 | PACK | Freq: Once | INTRAVENOUS | Status: AC | PRN
  Administered 2021-04-27: 5.3 via INTRAVENOUS

## 2021-04-27 MED ORDER — SODIUM CHLORIDE 0.9 % IV SOLN
6.2500 mg | Freq: Four times a day (QID) | INTRAVENOUS | Status: DC | PRN
  Administered 2021-04-28: 6.25 mg via INTRAVENOUS
  Filled 2021-04-27 (×2): qty 0.25

## 2021-04-27 MED ORDER — LACTATED RINGERS IV SOLN: INTRAVENOUS | Status: AC

## 2021-04-27 NOTE — ED Notes (Signed)
This RN talked to MD about the NM Hepato W/EF and the patients NPO status

## 2021-04-27 NOTE — Progress Notes (Signed)
LLE venous duplex and IVC/iliac duplex have been completed.  Critical results given to Dr. Wyline Copas.  Results can be found under chart review under CV PROC. 04/27/2021 10:08 AM Reylynn Vanalstine RVT, RDMS

## 2021-04-27 NOTE — Progress Notes (Signed)
PROGRESS NOTE    Wendy Watts  [id number] DOB: Mar 21, 1946 DOA: 04/26/2021 PCP: [name], MD    Brief Narrative:  75 y.o. female with history of advanced dementia who was recently admitted for right hip fracture discharged to rehab on March 30, 2021 and was discharged from rehab on April 21, 2021 back to home was having nausea vomiting for the last 2 days and was not doing well.  Patient's daughter went to check on her today and found that patient in addition to the nausea vomiting was having significant swelling of the left lower extremity and was brought to the ER.  Patient has been largely bedbound with nonweightbearing since her surgery.  Assessment & Plan:   Principal Problem:   Acute renal failure (ARF) (HCC) Active Problems:   Dementia without behavioral disturbance (HCC)   SIRS (systemic inflammatory response syndrome) (HCC)   Nausea & vomiting   ARF (acute renal failure) (HCC)   Acute renal failure  Likely secondary to dehydration related to poor PO intake Dry mucus membranes on exam with poor skin turgor Cont IVF hydration as tolerated  Repeat bmet in AM SIRS -at presentation patient had tachycardia with elevated lactic acid level significant leukocytosis source not clear.   Was started on empiric cefepime and flagyl Thus far blood cx neg. Afebrile Consider holding further abx if cultures finalize neg Extensive LLE DVT LE dopplers reviewed. Findings of extensive LLE DVT Pt is continued on heparin gtt Consulted Vascular Surgery. Plans to re-evaluate for venous thrombectomy once acute issues have resolved Nausea vomiting  Suspect secondary to increased stool burden seen on abd imaging Initial question of biliary disease, however HIDA was performed and reviewed, found to be neg Soap suds enema ordered. Would continue with aggerssive bowel regimen as tolerated Mildly elevated lipase No evidence of pancreatitis on CT Dementia on Aricept and Namenda.    Also takes BuSpar for anxiety. Recent right hip fracture status post surgery.   DVT prophylaxis: Heparin gtt Code Status: Full Family Communication: Pt in room, family not at bedside  Status is: Inpatient  Remains inpatient appropriate because:Inpatient level of care appropriate due to severity of illness  Dispo: The patient is from: Home              Anticipated d/c is to:  Unclear at this time              Patient currently is not medically stable to d/c.   Difficult to place patient No       Consultants:  Vascular Surgery  Procedures:    Antimicrobials: Anti-infectives (From admission, onward)    Start     Dose/Rate Route Frequency Ordered Stop   04/27/21 0000  ceFEPIme (MAXIPIME) 2 g in sodium chloride 0.9 % 100 mL IVPB        2 g 200 mL/hr over 30 Minutes Intravenous Every 24 hours 04/26/21 2128 05/05/27 2359   04/26/21 2200  metroNIDAZOLE (FLAGYL) IVPB 500 mg        500 mg 100 mL/hr over 60 Minutes Intravenous Every 12 hours 04/26/21 2124     04/26/21 2130  ceFEPIme (MAXIPIME) 2 g in sodium chloride 0.9 % 100 mL IVPB        2 g 200 mL/hr over 30 Minutes Intravenous  Once 04/26/21 2124         Subjective: This AM, asking for something to drink  Objective: Vitals:   04/27/21 1100 04/27/21 1101 04/27/21 1200 04/27/21 1525  BP:  139/70  119/67 125/77  Pulse: 91 91 99 (!) 101  Resp: 18 18 (!) 21 (!) 21  Temp:      TempSrc:      SpO2: 93% 93% 97% 97%  Weight:      Height:        Intake/Output Summary (Last 24 hours) at 04/27/2021 1833 Last data filed at 04/26/2021 2143 Gross per 24 hour  Intake 1000 ml  Output --  Net 1000 ml   Filed Weights   04/27/21 0800  Weight: 64.4 kg    Examination: General exam: Awake, laying in bed, in nad, mucus membranes are dry Respiratory system: Normal respiratory effort, no wheezing Cardiovascular system: regular rate, s1, s2 Gastrointestinal system: Soft, nondistended, positive BS Central nervous system:  CN2-12 grossly intact, strength intact Extremities: LLE markedly edematus, no clubbing Skin: Normal skin turgor, no notable skin lesions seen Psychiatry: Mood normal // no visual hallucinations   Data Reviewed: I have personally reviewed following labs and imaging studies  CBC: Recent Labs  Lab 04/26/21 1849 04/27/21 0717  WBC 31.5* 29.5*  NEUTROABS 28.1* 26.4*  HGB 15.1* 12.3  HCT 45.4 36.7  MCV 92.1 93.1  PLT 251 [id number]   Basic Metabolic Panel: Recent Labs  Lab 04/26/21 1849 04/27/21 0717  NA 134* 135  K 5.0 4.3  CL 99 99  CO2 20* 23  GLUCOSE 179* 140*  BUN 27* 33*  CREATININE 2.88* 2.70*  CALCIUM 10.9* 9.8   GFR: Estimated Creatinine Clearance: 16.2 mL/min (A) (by C-G formula based on SCr of 2.7 mg/dL (H)). Liver Function Tests: Recent Labs  Lab 04/26/21 1849 04/27/21 0717  AST 21 16  ALT 22 17  ALKPHOS 158* 109  BILITOT 1.1 1.1  PROT 7.5 5.9*  ALBUMIN 3.4* 2.7*   Recent Labs  Lab 04/26/21 1849  LIPASE 56*   No results for input(s): AMMONIA in the last 168 hours. Coagulation Profile: No results for input(s): INR, PROTIME in the last 168 hours. Cardiac Enzymes: No results for input(s): CKTOTAL, CKMB, CKMBINDEX, TROPONINI in the last 168 hours. BNP (last 3 results) No results for input(s): PROBNP in the last 8760 hours. HbA1C: No results for input(s): HGBA1C in the last 72 hours. CBG: No results for input(s): GLUCAP in the last 168 hours. Lipid Profile: No results for input(s): CHOL, HDL, LDLCALC, TRIG, CHOLHDL, LDLDIRECT in the last 72 hours. Thyroid Function Tests: No results for input(s): TSH, T4TOTAL, FREET4, T3FREE, THYROIDAB in the last 72 hours. Anemia Panel: No results for input(s): VITAMINB12, FOLATE, FERRITIN, TIBC, IRON, RETICCTPCT in the last 72 hours. Sepsis Labs: Recent Labs  Lab 04/26/21 1850 04/26/21 2050  LATICACIDVEN 3.7* 2.4*    Recent Results (from the past 240 hour(s))  Resp Panel by RT-PCR (Flu A&B, Covid)  Nasopharyngeal Swab     Status: None   Collection Time: 04/26/21  6:58 PM   Specimen: Nasopharyngeal Swab; Nasopharyngeal(NP) swabs in vial transport medium  Result Value Ref Range Status   SARS Coronavirus 2 by RT PCR NEGATIVE NEGATIVE Final    Comment: (NOTE) SARS-CoV-2 target nucleic acids are NOT DETECTED.  The SARS-CoV-2 RNA is generally detectable in upper respiratory specimens during the acute phase of infection. The lowest concentration of SARS-CoV-2 viral copies this assay can detect is 138 copies/mL. A negative result does not preclude SARS-Cov-2 infection and should not be used as the sole basis for treatment or other patient management decisions. A negative result may occur with  improper specimen collection/handling, submission of specimen  other than nasopharyngeal swab, presence of viral mutation(s) within the areas targeted by this assay, and inadequate number of viral copies(<138 copies/mL). A negative result must be combined with clinical observations, patient history, and epidemiological information. The expected result is Negative.  Fact Sheet for Patients:  EntrepreneurPulse.com.au  Fact Sheet for Healthcare Providers:  IncredibleEmployment.be  This test is no t yet approved or cleared by the [place] FDA and  has been authorized for detection and/or diagnosis of SARS-CoV-2 by FDA under an Emergency Use Authorization (EUA). This EUA will remain  in effect (meaning this test can be used) for the duration of the COVID-19 declaration under Section 564(b)(1) of the Act, 21 U.S.C.section 360bbb-3(b)(1), unless the authorization is terminated  or revoked sooner.       Influenza A by PCR NEGATIVE NEGATIVE Final   Influenza B by PCR NEGATIVE NEGATIVE Final    Comment: (NOTE) The Xpert Xpress SARS-CoV-2/FLU/RSV plus assay is intended as an aid in the diagnosis of influenza from Nasopharyngeal swab specimens and should not be  used as a sole basis for treatment. Nasal washings and aspirates are unacceptable for Xpert Xpress SARS-CoV-2/FLU/RSV testing.  Fact Sheet for Patients: EntrepreneurPulse.com.au  Fact Sheet for Healthcare Providers: IncredibleEmployment.be  This test is not yet approved or cleared by the [place] FDA and has been authorized for detection and/or diagnosis of SARS-CoV-2 by FDA under an Emergency Use Authorization (EUA). This EUA will remain in effect (meaning this test can be used) for the duration of the COVID-19 declaration under Section 564(b)(1) of the Act, 21 U.S.C. section 360bbb-3(b)(1), unless the authorization is terminated or revoked.  Performed at [facility], [place] [address] [place], [place] [address]   Blood culture (routine x 2)     Status: None (Preliminary result)   Collection Time: 04/26/21 10:35 PM   Specimen: BLOOD RIGHT FOREARM  Result Value Ref Range Status   Specimen Description BLOOD RIGHT FOREARM  Final   Special Requests   Final    BOTTLES DRAWN AEROBIC AND ANAEROBIC Blood Culture results may not be optimal due to an inadequate volume of blood received in culture bottles   Culture   Final    NO GROWTH < 12 HOURS Performed at [facility], [place] [address], [place],  [address]    Report Status PENDING  Incomplete     Radiology Studies: CT ABDOMEN PELVIS WO CONTRAST  Result Date: 04/26/2021 CLINICAL DATA:  Abdominal pain, acute, nonlocalized EXAM: CT ABDOMEN AND PELVIS WITHOUT CONTRAST TECHNIQUE: Multidetector CT imaging of the abdomen and pelvis was performed following the standard protocol without IV contrast. Unenhanced CT was performed per clinician order. Lack of IV contrast limits sensitivity and specificity, especially for evaluation of abdominal/pelvic solid viscera. COMPARISON:  09/21/2016 FINDINGS: Lower chest: No acute pleural or parenchymal lung disease. Hepatobiliary: High attenuation  material within the gallbladder consistent with sludge. No evidence of calcified gallstones or cholecystitis. Liver is unremarkable. Pancreas: Unremarkable. No pancreatic ductal dilatation or surrounding inflammatory changes. Spleen: Normal in size without focal abnormality. Adrenals/Urinary Tract: The adrenals are stable. There is a punctate 2 mm nonobstructing calculus within the lower pole left kidney. No right-sided calculi. No obstructive uropathy within either kidney. The bladder is minimally distended, limiting its evaluation. Punctate 2 mm calculus is seen layering dependently within the right lateral aspect of the bladder. Stomach/Bowel: There is a large amount of retained stool within the rectal vault, consistent with fecal impaction. No bowel obstruction or ileus. Normal  appendix right lower quadrant. No bowel wall thickening or inflammatory change. Vascular/Lymphatic: Minimal atherosclerosis. There is asymmetric enlargement of the left common iliac, internal iliac, and external iliac veins concerning for deep venous thrombosis. Correlation with left lower extremity Doppler evaluation may be useful. Reproductive: Status post hysterectomy. No adnexal masses. Other: No free fluid or free gas.  No abdominal wall hernia. Musculoskeletal: Postsurgical changes are seen from ORIF of a proximal right femur fracture. Alignment is near anatomic. No other acute or destructive bony lesions. There is diffuse subcutaneous edema within the visualized left lower extremity, likely related to suspected DVT. Reconstructed images demonstrate no additional findings. IMPRESSION: 1. Punctate 2 mm nonobstructing left renal calculus. There is also a punctate 2 mm calculus within the bladder lumen. No obstructive uropathy within either kidney. 2. Suspected deep venous thrombosis involving the left lower extremity. Left lower extremity Doppler ultrasound recommended. 3. Large amount of stool within the rectal vault, consistent with  fecal impaction. 4. Suspected gallbladder sludge. No evidence of cholelithiasis or cholecystitis. 5.  Aortic Atherosclerosis (ICD10-I70.0). Critical Value/emergent results were called by telephone at the time of interpretation on 04/26/2021 at 9:19 pm to provider [facility] , who verbally acknowledged these results. Electronically Signed   By: Randa Ngo M.D.   On: 04/26/2021 21:19   NM Hepatobiliary Liver Func  Result Date: 04/27/2021 CLINICAL DATA:  Abdominal pain, nausea, vomiting, diarrhea, constipation EXAM: NUCLEAR MEDICINE HEPATOBILIARY IMAGING TECHNIQUE: Sequential images of the abdomen were obtained out to 60 minutes following intravenous administration of radiopharmaceutical. RADIOPHARMACEUTICALS:  5.3 mCi Tc-82m Choletec IV COMPARISON:  No direct comparison study. Correlation made with same day right upper quadrant ultrasound. FINDINGS: Prompt uptake and biliary excretion of activity by the liver is seen. Gallbladder activity is visualized, consistent with patency of cystic duct. Biliary imaging was aborted after 1 hour as the patient could not tolerate completing the study. Tracer is not seen in the small bowel, the tracer is seen in the large bowel. Gallbladder ejection fraction was not calculated. IMPRESSION: 1. Normal visualization of the gallbladder, excluding acute cholecystitis. 2. The patient could not tolerate completing the study, as such, ejection fraction and evaluation for chronic cholecystitis were not completed. Electronically Signed   By: PValetta MoleM.D.   On: 04/27/2021 14:41   VAS [place]IVC/ILIAC (VENOUS ONLY)  Result Date: 04/27/2021 IVC/ILIAC STUDY Patient Name:  Wendy Watts Date of Exam:   04/27/2021 Medical Rec #: [id number]     Accession #:    [id number] of Birth: [birth year]      Patient Gender: F Patient Age:   89years [facility]      [facility] [place]IVC/ILIAC (VENOUS ONLY) Referring Phys: Myron Lona  --------------------------------------------------------------------------------  Indications: DVT LLE Limitations: Air/bowel gas and patient discomfort.  Comparison Study: CT of abdomen/pelvis showed enlarged left iliac system                   suggestive of DVT. Performing Technologist: JRogelia RohrerRVT, RDMS  Examination Guidelines: A complete evaluation includes B-mode imaging, spectral Doppler, color Doppler, and power Doppler as needed of all accessible portions of each vessel. Bilateral testing is considered an integral part of a complete examination. Limited examinations for reoccurring indications may be performed as noted.  IVC/Iliac Findings: +----------+------+-----------------+------------------------------------------+    IVC    Patent    Thrombus  Comments                  +----------+------+-----------------+------------------------------------------+ IVC Prox                         Not visualized due to bowel gas & patient                                   discomfort                                 +----------+------+-----------------+------------------------------------------+ IVC Mid   patent                                                            +----------+------+-----------------+------------------------------------------+ IVC Distal      age indeterminate                                           +----------+------+-----------------+------------------------------------------+  +-------------------+---------+-----------+---------+-----------+--------+         CIV        RT-PatentRT-ThrombusLT-PatentLT-ThrombusComments +-------------------+---------+-----------+---------+-----------+--------+ Common Iliac Prox   patent                         acute            +-------------------+---------+-----------+---------+-----------+--------+ Common Iliac Mid    patent                         acute             +-------------------+---------+-----------+---------+-----------+--------+ Common Iliac Distal patent                         acute            +-------------------+---------+-----------+---------+-----------+--------+  +-------------------+---------+-----------+---------+-----------------+--------+         EIV        RT-PatentRT-ThrombusLT-Patent   LT-Thrombus   Comments +-------------------+---------+-----------+---------+-----------------+--------+ External Iliac Vein patent                      age indeterminate         Prox                                                                      +-------------------+---------+-----------+---------+-----------------+--------+ External Iliac Vein patent                      age indeterminate         Mid                                                                       +-------------------+---------+-----------+---------+-----------------+--------+  External Iliac Vein patent                      age indeterminate         Distal                                                                    +-------------------+---------+-----------+---------+-----------------+--------+    Summary: IVC/Iliac: There is evidence of age indeterminate thrombus involving the IVC. There is no evidence of thrombus involving the right common iliac vein. There is evidence of acute thrombus involving the left common iliac vein. There is no evidence of thrombus involving the right external iliac vein. There is evidence of age indeterminate thrombus involving the left external iliac vein.  *See table(s) above for measurements and observations.   Preliminary    DG Chest Portable 1 View  Result Date: 04/26/2021 CLINICAL DATA:  Right-sided abdominal pain and left leg swelling. EXAM: PORTABLE CHEST 1 VIEW COMPARISON:  March 28, 2021 FINDINGS: Very mild atelectasis is seen within the lateral aspect of the left lung base. There is no  evidence of a pleural effusion or pneumothorax. The heart size and mediastinal contours are within normal limits. The visualized skeletal structures are unremarkable. IMPRESSION: Very mild left basilar atelectasis. Electronically Signed   By: Virgina Norfolk M.D.   On: 04/26/2021 21:48   VAS [name] LOWER EXTREMITY VENOUS (DVT) (ONLY [facility] & [facility])  Result Date: 04/27/2021  Lower Venous DVT Study Patient Name:  Wendy Watts  Date of Exam:   04/27/2021 Medical Rec #: [id number]      Accession #:    [id number] Date of Birth: Oct 06, 1945       Patient Gender: F Patient Age:   63 years Exam Location:  [facility] Procedure:      VAS [name] LOWER EXTREMITY VENOUS (DVT) Referring Phys: Gean Birchwood --------------------------------------------------------------------------------  Indications: Swelling. Other Indications: Abnormal CT of abdomen/pelvis (enlarged left iliac venous                    system in comparison to right). Risk Factors: Limited mobility post RT hip fracture Surgery Repair of RT hip FX (03/2021) Trauma Fall with RT hip FX 03/2021. Performing Technologist: Rogelia Rohrer RVT, RDMS  Examination Guidelines: A complete evaluation includes B-mode imaging, spectral Doppler, color Doppler, and power Doppler as needed of all accessible portions of each vessel. Bilateral testing is considered an integral part of a complete examination. Limited examinations for reoccurring indications may be performed as noted. The reflux portion of the exam is performed with the patient in reverse Trendelenburg.  +-----+---------------+---------+-----------+----------+--------------+ RIGHTCompressibilityPhasicitySpontaneityPropertiesThrombus Aging +-----+---------------+---------+-----------+----------+--------------+ CFV  Full           Yes      Yes                                 +-----+---------------+---------+-----------+----------+--------------+    +---------+---------------+---------+-----------+----------+------------------+ LEFT     CompressibilityPhasicitySpontaneityPropertiesThrombus Aging     +---------+---------------+---------+-----------+----------+------------------+ CFV      None           No       No  Acute              +---------+---------------+---------+-----------+----------+------------------+ SFJ      None                                         Acute              +---------+---------------+---------+-----------+----------+------------------+ FV Prox  None           No       No                   Acute              +---------+---------------+---------+-----------+----------+------------------+ FV Mid   None           No       No                   Acute              +---------+---------------+---------+-----------+----------+------------------+ FV DistalNone           No       No                   Acute              +---------+---------------+---------+-----------+----------+------------------+ PFV      None           No       No                   Acute              +---------+---------------+---------+-----------+----------+------------------+ POP      None           No       No                   Acute              +---------+---------------+---------+-----------+----------+------------------+ PTV      None           No       No                   Acute              +---------+---------------+---------+-----------+----------+------------------+ PERO     None           No       No                   Acute              +---------+---------------+---------+-----------+----------+------------------+ Gastroc  None           No       No                   Acute              +---------+---------------+---------+-----------+----------+------------------+ GSV      None           No       No                   Acute - prox/mid  thigh              +---------+---------------+---------+-----------+----------+------------------+    Summary: RIGHT: - No evidence of common femoral vein obstruction.  LEFT: - Findings consistent with acute deep vein thrombosis involving the left common femoral vein, SF junction, left femoral vein, left proximal profunda vein, left popliteal vein, left posterior tibial veins, left peroneal veins, and left gastrocnemius veins. - Findings consistent with acute superficial vein thrombosis involving the left great saphenous vein. - No cystic structure found in the popliteal fossa. Subcutaneous edema throughout left lower extremity.  *See table(s) above for measurements and observations.    Preliminary    US Abdomen Limited RUQ (LIVER/GB)  Result Date: 04/27/2021 CLINICAL DATA:  Nausea and vomiting. EXAM: ULTRASOUND ABDOMEN LIMITED RIGHT UPPER QUADRANT COMPARISON:  May 14, 2012 FINDINGS: Gallbladder: A mild amount of heterogeneous echogenic sludge is seen within the gallbladder lumen. No gallstones or wall thickening visualized (2.0 mm). A positive sonographic Murphy's sign is noted by sonographer. Common bile duct: Diameter: 3.0 mm Liver: No focal lesion identified. Diffusely increased echogenicity of the liver parenchyma is noted. Portal vein is patent on color Doppler imaging with normal direction of blood flow towards the liver. Other: None. IMPRESSION: 1. Echogenic sludge and a positive sonographic Murphy's sign in the absence of cholelithiasis. Further evaluation with a nuclear medicine hepatobiliary scan is recommended, as acalculous cholecystitis can not completely be excluded. 2. Fatty liver. Electronically Signed   By: Virgina Norfolk M.D.   On: 04/27/2021 00:07    Scheduled Meds:  busPIRone  10 mg Oral BID   diphenhydrAMINE  25 mg Oral Once   donepezil  10 mg Oral QHS   memantine  10 mg Oral BID   Continuous Infusions:  ceFEPime (MAXIPIME) IV     ceFEPime  (MAXIPIME) IV Stopped (04/26/21 2335)   heparin 1,000 Units/hr (04/27/21 0932)   lactated ringers 100 mL/hr at 04/27/21 0932   metronidazole Stopped (04/27/21 1157)   promethazine (PHENERGAN) injection (IM or IVPB)       LOS: 1 day   Marylu Lund, MD [facility] Pager On Amion  If 7PM-7AM, please contact night-coverage 04/27/2021, 6:33 PM

## 2021-04-27 NOTE — Consult Note (Addendum)
Hospital Consult    Reason for Consult:  DVT Requesting Physician:  Hal Hope MRN #:  [id number]  History of Present Illness: This is a 75 y.o. female who presented to the hospital with N/V and LLE swelling.   Her daughter is present with her today.  Pt has moderate dementia per daughter.  Pt underwent right hip surgery on 03/29/2021 after she sustained a fall.  Daughter states that prior to this, the only medications she was on was dementia medications. Daughter states that pt lives with her husband and son.  She was discharged home from SNF last Wednesday.  Daughter states that as of Saturday, she did not notice any swelling in the left leg.  She changed the pt on Monday and noticed swelling in the left leg that was considerably different from the right leg.  She has also been constipated.  CT of abdomen and pelvis did reveal some GB sludge.  She did have an u/s this morning that revealed a left acute left CIV thrombus as well as age indeterminate thrombus involving the IVC.  There is no evidence of thrombus on the right.  The pt has been placed on heparin.   She also has an AKI with a creatinine of 2.7.  she has leukocytosis of 29.5k.  she is being admitted to the medicine service.  She has been placed on abx.    There is no family hx of DVT that the daughter is aware and the pt does not have any hx of DVT.  The pt is not on a statin for cholesterol management.  The pt is not on a daily aspirin.   Other AC:  currently heparin gtt The pt is not on medication for hypertension.   The pt is not diabetic.   Tobacco hx:  never  Past Medical History:  Diagnosis Date   Anxiety    Dementia ([place])    High cholesterol    Vertigo     Past Surgical History:  Procedure Laterality Date   HYSTERECTOMY     INTRAMEDULLARY (IM) NAIL INTERTROCHANTERIC Right 03/28/2021   Procedure: INTRAMEDULLARY (IM) NAIL INTERTROCHANTRIC;  Surgeon: Meredith Pel, MD;  Location: [facility];  Service: Orthopedics;   Laterality: Right;   TOTAL KNEE ARTHROPLASTY Right     Allergies  Allergen Reactions   Allegra [Fexofenadine Hcl] Other (See Comments)    "takes me to another world"   Ativan [Lorazepam] Other (See Comments)    Hallucinations   Azithromycin Nausea And Vomiting   Codeine Hives   Doxycycline Hyclate Nausea Only   Penicillins Hives    Has patient had a PCN reaction causing immediate rash, facial/tongue/throat swelling, SOB or lightheadedness with hypotension: no Has patient had a PCN reaction causing severe rash involving mucus membranes or skin necrosis: no Has patient had a PCN reaction that required hospitalization: no Has patient had a PCN reaction occurring within the last 10 years: no If all of the above answers are "NO", then may proceed with Cephalosporin use.    Trazodone And Nefazodone Nausea Only   Zyrtec [Cetirizine] Other (See Comments)    "takes me to another world"    Prior to Admission medications   Medication Sig Start Date End Date Taking? Authorizing Provider  busPIRone (BUSPAR) 10 MG tablet Take 10 mg by mouth 2 (two) times daily. 02/11/21  Yes [provider]  donepezil (ARICEPT) 10 MG tablet TAKE 1 TABLET(10 MG) BY MOUTH AT BEDTIME Patient taking differently: Take 10 mg by mouth  at bedtime. 02/17/21  Yes Lomax, Amy, NP  fluticasone (FLONASE) 50 MCG/ACT nasal spray Place 2 sprays into both nostrils daily as needed for allergies or rhinitis.   Yes [provider]  memantine (NAMENDA) 10 MG tablet Take 1 tablet (10 mg total) by mouth 2 (two) times daily. 02/16/21  Yes Lomax, Amy, NP  methocarbamol (ROBAXIN) 500 MG tablet Take 1 tablet (500 mg total) by mouth every 8 (eight) hours as needed for muscle spasms. 03/30/21  Yes [place] Eric J, DO  traMADol (ULTRAM) 50 MG tablet Take 1 tablet (50 mg total) by mouth every 6 (six) hours as needed for moderate pain. 03/30/21  Yes [place] Eric J, DO  VITAMIN D PO Take 5,000 Units by mouth daily.   Yes [provider]  Wheat Dextrin (BENEFIBER DRINK MIX) PACK Take 1 Scoop by mouth daily. Mix in applesauce   Yes [provider]  enoxaparin (LOVENOX) 40 MG/0.4ML injection Inject 0.4 mLs (40 mg total) into the skin daily. 03/30/21 04/29/21  [place] Eric J, DO    Social History   Socioeconomic History   Marital status: Married    Spouse name: Not on file   Number of children: 2   Years of education: Not on file   Highest education level: High school graduate  Occupational History   Not on file  Tobacco Use   Smoking status: Never   Smokeless tobacco: Never  Substance and Sexual Activity   Alcohol use: Never   Drug use: Never   Sexual activity: Not on file  Other Topics Concern   Not on file  Social History Narrative   [place] at home with husband and son   Social Determinants of Health   Financial Resource Strain: Not on file  Food Insecurity: Not on file  Transportation Needs: Not on file  Physical Activity: Not on file  Stress: Not on file  Social Connections: Not on file  Intimate Partner Violence: Not on file   Family History  Problem Relation Age of Onset   Dementia Neg Hx    Alzheimer's disease Neg Hx     ROS: '[x]'$  Positive   '[ ]'$  Negative   '[ ]'$  All sytems reviewed and are negative  Cardiac: '[]'$  chest pain/pressure '[]'$  palpitations '[]'$  SOB lying flat '[]'$  DOE  Vascular: '[]'$  pain in legs while walking '[]'$  pain in legs at rest '[]'$  pain in legs at night '[]'$  non-healing ulcers '[x]'$  hx of DVT-current '[x]'$  swelling in leg-left  Pulmonary: '[]'$  asthma/wheezing '[]'$  home O2  Neurologic: '[]'$  hx of CVA '[]'$  mini stroke   Hematologic: '[]'$  hx of cancer  Endocrine:   '[]'$  diabetes '[]'$  thyroid disease  GI '[]'$  GERD  GU: '[]'$  CKD/renal failure '[]'$  HD--'[]'$  M/W/F or '[]'$  T/T/S  Psychiatric: '[]'$  anxiety '[]'$  depression  Musculoskeletal: '[x]'$  recent left hip surgery  Integumentary: '[]'$  rashes '[]'$  ulcers  Constitutional: '[]'$  fever  '[]'$  chills  Physical Examination  Vitals:    04/27/21 1100 04/27/21 1101  BP: 139/70   Pulse: 91 91  Resp: 18   Temp:    SpO2: 93% 93%   Body mass index is 23.63 kg/m.  General:  WDWN in NAD Gait: Not observed HENT: WNL, normocephalic Pulmonary: normal non-labored breathing Cardiac: regular Skin: without rashes Vascular Exam/Pulses:  Right Left  DP 2+ (normal) 2+ (normal)  PT 2+ (normal) 2+ (normal)   Extremities: without ischemic changes, without Gangrene , without cellulitis; without open wounds; +pitting edema LLE up to mid thigh Musculoskeletal:  no muscle wasting or atrophy  Neurologic: A&O X 3; speech is fluent/normal   CBC    Component Value Date/Time   WBC 29.5 (H) 04/27/2021 0717   RBC 3.94 04/27/2021 0717   HGB 12.3 04/27/2021 0717   HGB 13.7 05/08/2019 1156   HCT 36.7 04/27/2021 0717   HCT 41.4 05/08/2019 1156   PLT 203 04/27/2021 0717   PLT 357 05/08/2019 1156   MCV 93.1 04/27/2021 0717   MCV 92 05/08/2019 1156   MCH 31.2 04/27/2021 0717   MCHC 33.5 04/27/2021 0717   RDW 13.5 04/27/2021 0717   RDW 12.5 05/08/2019 1156   LYMPHSABS 0.8 04/27/2021 0717   MONOABS 2.0 (H) 04/27/2021 0717   EOSABS 0.0 04/27/2021 0717   BASOSABS 0.1 04/27/2021 0717    BMET    Component Value Date/Time   NA 135 04/27/2021 0717   NA 142 05/08/2019 1156   K 4.3 04/27/2021 0717   CL 99 04/27/2021 0717   CO2 23 04/27/2021 0717   GLUCOSE 140 (H) 04/27/2021 0717   BUN 33 (H) 04/27/2021 0717   BUN 11 05/08/2019 1156   CREATININE 2.70 (H) 04/27/2021 0717   CALCIUM 9.8 04/27/2021 0717   GFRNONAA 18 (L) 04/27/2021 0717   GFRAA 72 05/08/2019 1156    COAGS: Lab Results  Component Value Date   INR 1.60 (H) 02/08/2010   INR 1.34 02/07/2010   INR 1.14 02/06/2010     Non-Invasive Vascular Imaging:   IVC and lower extremity venous duplex 04/27/2021 IVC/Iliac: There is evidence of age indeterminate thrombus involving the  IVC. There is no evidence of thrombus involving the right common iliac vein. There is  evidence of acute thrombus involving the left common iliac vein. There is no evidence of thrombus involving the right external iliac vein. There is evidence of age indeterminate thrombus involving the left external iliac vein.    RIGHT:  - No evidence of common femoral vein obstruction.     LEFT:  - Findings consistent with acute deep vein thrombosis involving the left  common femoral vein, SF junction, left femoral vein, left proximal  profunda vein, left popliteal vein, left posterior tibial veins, left  peroneal veins, and left gastrocnemius  veins.  - Findings consistent with acute superficial vein thrombosis involving the left great saphenous vein.  - No cystic structure found in the popliteal fossa.  Subcutaneous edema throughout left lower extremity.    ASSESSMENT/PLAN: This is a 75 y.o. female with acute DVT LLE and CIV.  -pt with multiple medical issues currently including AKI with creatinine of 2.7.  -recommend heparin gtt with elevation of legs and compression currently.  Will re-evaluate for venous thrombectomy once other issues have been treated and sorted out.  This was discussed with daughter by Dr. Trula Slade and she expressed understanding.    Leontine Locket, PA-C [facility] [phone]    I agree with the above.  I have seen and evaluated the patient.  Her daughter was present during our evaluation.  Briefly this is a 75 year old female with moderate dementia who underwent right hip surgery approximately 1 month ago after a fall.  She went to a skilled nursing facility.  Approximately 3 to 4 days ago it was noted that she had significant swelling in her left leg.  She was brought to the hospital for further evaluation.  Ultrasound imaging however left leg revealed acute thrombus IVC and iliac veins and distal.  She was started on IV heparin.  During  her work-up she was also found to have gallstones.  She was in acute renal failure with a creatinine of  2.7.  Her white blood cell count was elevated 29.5  I discussed that we could consider mechanical thrombectomy to improve her long-term outcome and minimize her symptoms, once her medical issues have resolved.  In the meantime she should be continue on IV heparin.  We will continue to follow the patient while she is in the hospital and consider intervention if her medical issues are corrected  [name]

## 2021-04-27 NOTE — ED Notes (Signed)
Patient in [place] W/EF

## 2021-04-27 NOTE — ED Notes (Signed)
Pt's daughter requesting all updates called to her, will convey messages to husband/family appropriately. Number verified & in chart

## 2021-04-27 NOTE — Progress Notes (Signed)
ANTICOAGULATION CONSULT NOTE - Follow Up Consult  Pharmacy Consult for heparin Indication:  VTE treatment  Allergies  Allergen Reactions   Allegra [Fexofenadine Hcl] Other (See Comments)    "takes me to another world"   Ativan [Lorazepam] Other (See Comments)    Hallucinations   Azithromycin Nausea And Vomiting   Codeine Hives   Doxycycline Hyclate Nausea Only   Penicillins Hives    Has patient had a PCN reaction causing immediate rash, facial/tongue/throat swelling, SOB or lightheadedness with hypotension: no Has patient had a PCN reaction causing severe rash involving mucus membranes or skin necrosis: no Has patient had a PCN reaction that required hospitalization: no Has patient had a PCN reaction occurring within the last 10 years: no If all of the above answers are "NO", then may proceed with Cephalosporin use.    Trazodone And Nefazodone Nausea Only   Zyrtec [Cetirizine] Other (See Comments)    "takes me to another world"   Patient Measurements: Heparin Dosing Weight: 65 kg   Vital Signs: BP: 119/67 (09/13 0800) Pulse Rate: 94 (09/13 0800)  Labs: Recent Labs    04/26/21 1849 04/27/21 0717  HGB 15.1* 12.3  HCT 45.4 36.7  PLT 251 203  HEPARINUNFRC  --  0.62  CREATININE 2.88* 2.70*    Estimated Creatinine Clearance: 16.2 mL/min (A) (by C-G formula based on SCr of 2.7 mg/dL (H)).  Medical History: Past Medical History:  Diagnosis Date   Anxiety    Dementia ([place])    High cholesterol    Vertigo    Medications: see MAR  Assessment: 51 YOF who recently had a R hip fracture s/p surgery here with acute DVT to start IV heparin for VTE treatment. Hgb elevated, Plt wnl. SCr remains elevated, slightly down from admit, 2.8 > 2.7.   1st check HL therapeutic at 0.6 this AM  Goal of Therapy:  Heparin level 0.3-0.7 units/ml Monitor platelets by anticoagulation protocol: Yes   Plan:  -Continue heparin infusion at 1000 units/hr  -F/u 8 hr confirmatory HL  -Monitor  daily HL, CBC and s/s of bleeding   Joetta Manners, PharmD, [facility] Emergency Medicine Clinical Pharmacist ED RPh Phone: [place]: [phone]

## 2021-04-27 NOTE — ED Notes (Signed)
Pt Daughter has been updated and is at bedside

## 2021-04-27 NOTE — Progress Notes (Signed)
ANTICOAGULATION CONSULT NOTE - Follow Up Consult  Pharmacy Consult for heparin Indication:  VTE treatment  Allergies  Allergen Reactions   Allegra [Fexofenadine Hcl] Other (See Comments)    "takes me to another world"   Ativan [Lorazepam] Other (See Comments)    Hallucinations   Azithromycin Nausea And Vomiting   Codeine Hives   Doxycycline Hyclate Nausea Only   Penicillins Hives    Has patient had a PCN reaction causing immediate rash, facial/tongue/throat swelling, SOB or lightheadedness with hypotension: no Has patient had a PCN reaction causing severe rash involving mucus membranes or skin necrosis: no Has patient had a PCN reaction that required hospitalization: no Has patient had a PCN reaction occurring within the last 10 years: no If all of the above answers are "NO", then may proceed with Cephalosporin use.    Trazodone And Nefazodone Nausea Only   Zyrtec [Cetirizine] Other (See Comments)    "takes me to another world"   Patient Measurements: Heparin Dosing Weight: 65 kg   Vital Signs: BP: 125/77 (09/13 1525) Pulse Rate: 101 (09/13 1525)  Labs: Recent Labs    04/26/21 1849 04/27/21 0717 04/27/21 1629  HGB 15.1* 12.3  --   HCT 45.4 36.7  --   PLT 251 203  --   HEPARINUNFRC  --  0.62 0.50  CREATININE 2.88* 2.70*  --     Estimated Creatinine Clearance: 16.2 mL/min (A) (by C-G formula based on SCr of 2.7 mg/dL (H)).  Medical History: Past Medical History:  Diagnosis Date   Anxiety    Dementia ([place])    High cholesterol    Vertigo    Medications: see MAR  Assessment: 71 YOF who recently had a R hip fracture s/p surgery here with acute DVT to start IV heparin for VTE treatment (acute L CIV thrombus on ultrasound).   Heparin level is therapeutic at 0.5 on infusion of 1000 units/hr.  Hgb 12.3 is at baseline, plt normal. SCr remains elevated, but improving since admission, 2.8 > 2.7.   Goal of Therapy:  Heparin level 0.3-0.7 units/ml Monitor platelets  by anticoagulation protocol: Yes   Plan:  Continue heparin infusion at 1000 units/hr  Daily heparin level, CBC Monitor for s/s of bleeding  Follow-up transition to [place], PharmD, [facility] Emergency Medicine Clinical Pharmacist ED RPh Phone: [place]: [phone]

## 2021-04-28 DIAGNOSIS — F0390 Unspecified dementia without behavioral disturbance: Secondary | ICD-10-CM

## 2021-04-28 DIAGNOSIS — I82432 Acute embolism and thrombosis of left popliteal vein: Secondary | ICD-10-CM | POA: Diagnosis not present

## 2021-04-28 DIAGNOSIS — I82442 Acute embolism and thrombosis of left tibial vein: Secondary | ICD-10-CM | POA: Diagnosis not present

## 2021-04-28 DIAGNOSIS — I82412 Acute embolism and thrombosis of left femoral vein: Secondary | ICD-10-CM | POA: Diagnosis not present

## 2021-04-28 DIAGNOSIS — I82492 Acute embolism and thrombosis of other specified deep vein of left lower extremity: Secondary | ICD-10-CM

## 2021-04-28 DIAGNOSIS — Z9181 History of falling: Secondary | ICD-10-CM

## 2021-04-28 DIAGNOSIS — Z9889 Other specified postprocedural states: Secondary | ICD-10-CM

## 2021-04-28 DIAGNOSIS — I82452 Acute embolism and thrombosis of left peroneal vein: Secondary | ICD-10-CM | POA: Diagnosis not present

## 2021-04-28 DIAGNOSIS — R112 Nausea with vomiting, unspecified: Secondary | ICD-10-CM | POA: Diagnosis not present

## 2021-04-28 DIAGNOSIS — E440 Moderate protein-calorie malnutrition: Secondary | ICD-10-CM | POA: Diagnosis not present

## 2021-04-28 DIAGNOSIS — N179 Acute kidney failure, unspecified: Secondary | ICD-10-CM | POA: Diagnosis not present

## 2021-04-28 LAB — COMPREHENSIVE METABOLIC PANEL
ALT: 17 U/L (ref 0–44)
AST: 16 U/L (ref 15–41)
Albumin: 2.5 g/dL — ABNORMAL LOW (ref 3.5–5.0)
Alkaline Phosphatase: 109 U/L (ref 38–126)
Anion gap: 12 (ref 5–15)
BUN: 37 mg/dL — ABNORMAL HIGH (ref 8–23)
CO2: 22 mmol/L (ref 22–32)
Calcium: 9.6 mg/dL (ref 8.9–10.3)
Chloride: 101 mmol/L (ref 98–111)
Creatinine, Ser: 2.09 mg/dL — ABNORMAL HIGH (ref 0.44–1.00)
GFR, Estimated: 24 mL/min — ABNORMAL LOW (ref >60)
Glucose, Bld: 99 mg/dL (ref 70–99)
Potassium: 4 mmol/L (ref 3.5–5.1)
Sodium: 135 mmol/L (ref 135–145)
Total Bilirubin: 1.1 mg/dL (ref 0.3–1.2)
Total Protein: 5.5 g/dL — ABNORMAL LOW (ref 6.5–8.1)

## 2021-04-28 LAB — HEPARIN LEVEL (UNFRACTIONATED)
Heparin Unfractionated: 0.23 IU/mL — ABNORMAL LOW (ref 0.30–0.70)
Heparin Unfractionated: 0.41 IU/mL (ref 0.30–0.70)
Heparin Unfractionated: 0.5 IU/mL (ref 0.30–0.70)

## 2021-04-28 LAB — CBC
HCT: 35.5 % — ABNORMAL LOW (ref 36.0–46.0)
Hemoglobin: 11.7 g/dL — ABNORMAL LOW (ref 12.0–15.0)
MCH: 30.6 pg (ref 26.0–34.0)
MCHC: 33 g/dL (ref 30.0–36.0)
MCV: 92.9 fL (ref 80.0–100.0)
Platelets: 206 10*3/uL (ref 150–400)
RBC: 3.82 MIL/uL — ABNORMAL LOW (ref 3.87–5.11)
RDW: 13.6 % (ref 11.5–15.5)
WBC: 23.7 10*3/uL — ABNORMAL HIGH (ref 4.0–10.5)
nRBC: 0 % (ref 0.0–0.2)

## 2021-04-28 MED ORDER — ADULT MULTIVITAMIN W/MINERALS CH
1.0000 | ORAL_TABLET | Freq: Every day | ORAL | Status: DC
  Administered 2021-04-28 – 2021-05-12 (×12): 1 via ORAL
  Filled 2021-04-28 (×14): qty 1

## 2021-04-28 MED ORDER — PROSOURCE PLUS PO LIQD
30.0000 mL | Freq: Two times a day (BID) | ORAL | Status: DC
  Administered 2021-04-28 – 2021-05-11 (×13): 30 mL via ORAL
  Filled 2021-04-28 (×14): qty 30

## 2021-04-28 MED ORDER — BOOST / RESOURCE BREEZE PO LIQD CUSTOM
1.0000 | Freq: Three times a day (TID) | ORAL | Status: DC
  Administered 2021-04-28 – 2021-05-11 (×8): 1 via ORAL

## 2021-04-28 NOTE — Progress Notes (Signed)
ANTICOAGULATION CONSULT NOTE - Follow Up Consult  Pharmacy Consult for Heparin Indication:  VTE  Allergies  Allergen Reactions   Allegra [Fexofenadine Hcl] Other (See Comments)    "takes me to another world"   Ativan [Lorazepam] Other (See Comments)    Hallucinations   Azithromycin Nausea And Vomiting   Codeine Hives   Doxycycline Hyclate Nausea Only   Penicillins Hives    Has patient had a PCN reaction causing immediate rash, facial/tongue/throat swelling, SOB or lightheadedness with hypotension: no Has patient had a PCN reaction causing severe rash involving mucus membranes or skin necrosis: no Has patient had a PCN reaction that required hospitalization: no Has patient had a PCN reaction occurring within the last 10 years: no If all of the above answers are "NO", then may proceed with Cephalosporin use.    Trazodone And Nefazodone Nausea Only   Zyrtec [Cetirizine] Other (See Comments)    "takes me to another world"    Patient Measurements: Height: '5\' 5"'$  (165.1 cm) Weight: 69.8 kg (153 lb 14.1 oz) IBW/kg (Calculated) : 57 Heparin Dosing Weight:    Vital Signs: Temp: 98.4 F (36.9 C) (09/14 1929) Temp Source: Oral (09/14 1929) BP: 94/54 (09/14 1929) Pulse Rate: 91 (09/14 1929)  Labs: Recent Labs    04/26/21 1849 04/27/21 0717 04/27/21 1629 04/28/21 0035 04/28/21 1247 04/28/21 2033  HGB 15.1* 12.3  --  11.7*  --   --   HCT 45.4 36.7  --  35.5*  --   --   PLT 251 203  --  206  --   --   HEPARINUNFRC  --  0.62   < > 0.23* 0.41 0.50  CREATININE 2.88* 2.70*  --  2.09*  --   --    < > = values in this interval not displayed.    Estimated Creatinine Clearance: 22.8 mL/min (A) (by C-G formula based on SCr of 2.09 mg/dL (H)).  Assessment: Anticoag: Acute DVT > hep gtt, no AC PTA - Hep level 0.41 and now 0.5 remains in goal.  Goal of Therapy:  Heparin level 0.3-0.7 units/ml Monitor platelets by anticoagulation protocol: Yes   Plan:  - Heparin 1100 units/hr   Daily HL and CBC   Ringo Sherod S. Alford Highland, PharmD, BCPS Clinical Staff Pharmacist [email or web address] [place] 04/28/2021,9:24 PM

## 2021-04-28 NOTE — Progress Notes (Signed)
Initial Nutrition Assessment  DOCUMENTATION CODES:   Non-severe (moderate) malnutrition in context of chronic illness  INTERVENTION:   -Boost Breeze po TID, each supplement provides 250 kcal and 9 grams of protein  -30 ml Prosource Plus BID, each supplement provides 100 kcals and 15 grams protein -MVI with minerals daily -RD will follow for diet advancement and adjust supplement regimen as appropriate  NUTRITION DIAGNOSIS:   Moderate Malnutrition related to chronic illness (dementia) as evidenced by mild fat depletion, moderate fat depletion, mild muscle depletion, moderate muscle depletion, energy intake < 75% for > or equal to 1 month.  GOAL:   Patient will meet greater than or equal to 90% of their needs  MONITOR:   PO intake, Supplement acceptance, Diet advancement, Labs, Weight trends, Skin, I & O's  REASON FOR ASSESSMENT:   Malnutrition Screening Tool    ASSESSMENT:   Wendy Watts is a 75 y.o. female with history of advanced dementia who was recently admitted for right hip fracture discharged to rehab on March 30, 2021 and was discharged from rehab on April 21, 2021 back to home was having nausea vomiting for the last 2 days and was not doing well.  Patient's daughter went to check on her today and found that patient in addition to the nausea vomiting was having significant swelling of the left lower extremity and was brought to the ER.  Patient has been largely bedbound with nonweightbearing since her surgery.  Pt admitted with AKI secondary to dehydration and DVT.   Reviewed I/O's: +240 ml x 24 hours and +1.2 L since admission  Spoke with pt and husband at bedside. Husband provided most of the history, as pt is a poor historian secondary to dementia. Per husband, pt has experienced a general decline in health over the past month, since undergoing hip surgery during prior hospitalization. Pt was transferred to SNF and had poor oral intake there. Per pt, she did not  like the food at SNF and would often refuse to eat after one bite. Pt husband reports that pt often needs encouragement to eat and would eat fairly well for the one meal per day that he was present for.   Pt's intake improved when pt returned home about a week ago. Pt would consume 3 meals per day (Breakfast corn flakes, Lunch: sandwich, Dinner: potatoes and beans). Pt consumes a lot of fluids,, including gingerale, water, and sweet tea. Pt complained of her mouth being dry and RD provided her with water.   Per pt, she does not like Glucerna supplements. Discussed potential for diet advancement, as well as importance of good meal and supplement intake to promote healing. Pt amenable to Boost Breeze.   Medications reviewed and include lactated ringers infusion @ 100 ml/hr.   Labs reviewed.   NUTRITION - FOCUSED PHYSICAL EXAM:  Flowsheet Row Most Recent Value  Orbital Region Moderate depletion  Upper Arm Region Mild depletion  Thoracic and Lumbar Region No depletion  Buccal Region Mild depletion  Temple Region Moderate depletion  Clavicle Bone Region No depletion  Clavicle and Acromion Bone Region No depletion  Scapular Bone Region No depletion  Dorsal Hand Mild depletion  Patellar Region No depletion  Anterior Thigh Region No depletion  Posterior Calf Region No depletion  Edema (RD Assessment) Mild  Hair Reviewed  Eyes Reviewed  Mouth Reviewed  Skin Reviewed  Nails Reviewed       Diet Order:   Diet Order  Diet clear liquid Room service appropriate? Yes; Fluid consistency: Thin  Diet effective now                   EDUCATION NEEDS:   Education needs have been addressed  Skin:  Skin Assessment: Reviewed RN Assessment  Last BM:  Unknown  Height:   Ht Readings from Last 1 Encounters:  04/27/21 '5\' 5"'$  (1.651 m)    Weight:   Wt Readings from Last 1 Encounters:  04/27/21 69.8 kg    Ideal Body Weight:  56.8 kg  BMI:  Body mass index is 25.61  kg/m.  Estimated Nutritional Needs:   Kcal:  1900-2100  Protein:  90-105 grams  Fluid:  > 1.9 L    [name], RD, LDN, [place] Registered Dietitian II Certified Diabetes Care and Education Specialist Please refer to [facility] for RD and/or RD on-call/weekend/after hours pager

## 2021-04-28 NOTE — Progress Notes (Signed)
ANTICOAGULATION CONSULT NOTE - Follow Up Consult  Pharmacy Consult for heparin Indication:  VTE treatment  Allergies  Allergen Reactions   Allegra [Fexofenadine Hcl] Other (See Comments)    "takes me to another world"   Ativan [Lorazepam] Other (See Comments)    Hallucinations   Azithromycin Nausea And Vomiting   Codeine Hives   Doxycycline Hyclate Nausea Only   Penicillins Hives    Has patient had a PCN reaction causing immediate rash, facial/tongue/throat swelling, SOB or lightheadedness with hypotension: no Has patient had a PCN reaction causing severe rash involving mucus membranes or skin necrosis: no Has patient had a PCN reaction that required hospitalization: no Has patient had a PCN reaction occurring within the last 10 years: no If all of the above answers are "NO", then may proceed with Cephalosporin use.    Trazodone And Nefazodone Nausea Only   Zyrtec [Cetirizine] Other (See Comments)    "takes me to another world"   Patient Measurements: Heparin Dosing Weight: 65 kg   Vital Signs: Temp: 100.1 F (37.8 C) (09/14 0921) Temp Source: Oral (09/14 0921) BP: 103/47 (09/14 0921) Pulse Rate: 94 (09/14 0921)  Labs: Recent Labs    04/26/21 1849 04/26/21 1849 04/27/21 0717 04/27/21 1629 04/28/21 0035 04/28/21 1247  HGB 15.1*  --  12.3  --  11.7*  --   HCT 45.4  --  36.7  --  35.5*  --   PLT 251  --  203  --  206  --   HEPARINUNFRC  --    < > 0.62 0.50 0.23* 0.41  CREATININE 2.88*  --  2.70*  --  2.09*  --    < > = values in this interval not displayed.    Estimated Creatinine Clearance: 22.8 mL/min (A) (by C-G formula based on SCr of 2.09 mg/dL (H)).  Medical History: Past Medical History:  Diagnosis Date   Anxiety    Dementia ([place])    High cholesterol    Vertigo    Medications: see MAR  Assessment: 71 YOF who recently had a R hip fracture s/p surgery here with acute DVT to start IV heparin for VTE treatment (acute L CIV thrombus on ultrasound).    Heparin level is therapeutic at 0.41 after increasing infusion to 1100 units/hr this morning.  Hgb 11.7 is stable, plt wnl. SCr improving 2.7 > 2.09   Goal of Therapy:  Heparin level 0.3-0.7 units/ml Monitor platelets by anticoagulation protocol: Yes   Plan:  Continue heparin infusion at 1100 units/hr  Check confirmatory heparin level in 8 hours Daily heparin level, CBC Monitor for s/s of bleeding  Follow-up transition to [facility]   Thank you for allowing [name] to participate in this patients care. Jens Som, PharmD 04/28/2021 2:04 PM  **Pharmacist phone directory can be found on [email or web address] listed under [place]**

## 2021-04-28 NOTE — Plan of Care (Signed)
  Problem: Education: Goal: Knowledge of General Education information will improve Description Including pain rating scale, medication(s)/side effects and non-pharmacologic comfort measures Outcome: Progressing   

## 2021-04-28 NOTE — Progress Notes (Signed)
[place] for heparin Indication:  VTE treatment  Allergies  Allergen Reactions   Allegra [Fexofenadine Hcl] Other (See Comments)    "takes me to another world"   Ativan [Lorazepam] Other (See Comments)    Hallucinations   Azithromycin Nausea And Vomiting   Codeine Hives   Doxycycline Hyclate Nausea Only   Penicillins Hives    Has patient had a PCN reaction causing immediate rash, facial/tongue/throat swelling, SOB or lightheadedness with hypotension: no Has patient had a PCN reaction causing severe rash involving mucus membranes or skin necrosis: no Has patient had a PCN reaction that required hospitalization: no Has patient had a PCN reaction occurring within the last 10 years: no If all of the above answers are "NO", then may proceed with Cephalosporin use.    Trazodone And Nefazodone Nausea Only   Zyrtec [Cetirizine] Other (See Comments)    "takes me to another world"   Patient Measurements: Heparin Dosing Weight: 65 kg   Vital Signs: Temp: 98.7 F (37.1 C) (09/13 2351) Temp Source: Oral (09/13 2351) BP: 110/63 (09/13 2351) Pulse Rate: 98 (09/13 2351)  Labs: Recent Labs    04/26/21 1849 04/27/21 0717 04/27/21 1629 04/28/21 0035  HGB 15.1* 12.3  --  11.7*  HCT 45.4 36.7  --  35.5*  PLT 251 203  --  206  HEPARINUNFRC  --  0.62 0.50 0.23*  CREATININE 2.88* 2.70*  --   --     Estimated Creatinine Clearance: 17.6 mL/min (A) (by C-G formula based on SCr of 2.7 mg/dL (H)).   Assessment: 75 y.o. female with DVT for heparin  Goal of Therapy:  Heparin level 0.3-0.7 units/ml Monitor platelets by anticoagulation protocol: Yes   Plan:  Increase Heparin 1100 units/hr Check heparin level in 8 hours.   Phillis Knack, PharmD, BCPS

## 2021-04-28 NOTE — Progress Notes (Signed)
  Progress Note    04/28/2021 8:22 AM * No surgery found *  Subjective:  Denies pain   Vitals:   04/27/21 2351 04/28/21 0358  BP: 110/63 (!) 90/51  Pulse: 98 93  Resp: 19 17  Temp: 98.7 F (37.1 C) 98.7 F (37.1 C)  SpO2: 96% 92%    Physical Exam: General appearance: Awake, alert in no apparent distress. Pleasantly demented. Cardiac: Heart rate and rhythm are regular Respirations: Nonlabored Extremities: LLE: moderate edema. Calf is soft. Nontender. 2+ DP pulse. Leg not elevated.  Incontinent of urine  CBC    Component Value Date/Time   WBC 23.7 (H) 04/28/2021 0035   RBC 3.82 (L) 04/28/2021 0035   HGB 11.7 (L) 04/28/2021 0035   HGB 13.7 05/08/2019 1156   HCT 35.5 (L) 04/28/2021 0035   HCT 41.4 05/08/2019 1156   PLT 206 04/28/2021 0035   PLT 357 05/08/2019 1156   MCV 92.9 04/28/2021 0035   MCV 92 05/08/2019 1156   MCH 30.6 04/28/2021 0035   MCHC 33.0 04/28/2021 0035   RDW 13.6 04/28/2021 0035   RDW 12.5 05/08/2019 1156   LYMPHSABS 0.8 04/27/2021 0717   MONOABS 2.0 (H) 04/27/2021 0717   EOSABS 0.0 04/27/2021 0717   BASOSABS 0.1 04/27/2021 0717    BMET    Component Value Date/Time   NA 135 04/28/2021 0035   NA 142 05/08/2019 1156   K 4.0 04/28/2021 0035   CL 101 04/28/2021 0035   CO2 22 04/28/2021 0035   GLUCOSE 99 04/28/2021 0035   BUN 37 (H) 04/28/2021 0035   BUN 11 05/08/2019 1156   CREATININE 2.09 (H) 04/28/2021 0035   CALCIUM 9.6 04/28/2021 0035   GFRNONAA 24 (L) 04/28/2021 0035   GFRAA 72 05/08/2019 1156     Intake/Output Summary (Last 24 hours) at 04/28/2021 0822 Last data filed at 04/27/2021 2147 Gross per 24 hour  Intake 240 ml  Output --  Net 240 ml    HOSPITAL MEDICATIONS Scheduled Meds:  busPIRone  10 mg Oral BID   diphenhydrAMINE  25 mg Oral Once   donepezil  10 mg Oral QHS   memantine  10 mg Oral BID   Continuous Infusions:  ceFEPime (MAXIPIME) IV     ceFEPime (MAXIPIME) IV 2 g (04/28/21 0143)   heparin 1,100 Units/hr  (04/28/21 0246)   lactated ringers 100 mL/hr at 04/28/21 [id number]   metronidazole 500 mg (04/27/21 2225)   promethazine (PHENERGAN) injection (IM or IVPB) 6.25 mg (04/28/21 0045)   PRN Meds:.acetaminophen **OR** acetaminophen, promethazine (PHENERGAN) injection (IM or IVPB)  Assessment and Plan: LLE DVT with recent history of right hip surgery. Remains on heparin infusion. Thrombus  involves IVC, iliac veins and distally. Serum creatinine improved today. She remains afebrile with elevated but decreasing WBC. On cefepime and metronidazole. Will continue to follow clinical course with possibility of mechanical thrombectomy  Need to elevate LLE above heart level.  -DVT prophylaxis:  heparin infusion   Risa Grill, PA-C [facility] [phone] 04/28/2021  8:22 AM

## 2021-04-28 NOTE — Progress Notes (Addendum)
PROGRESS NOTE    Wendy Watts  [id number] DOB: 23-Jul-1946 DOA: 04/26/2021 PCP: [name], MD    Brief Narrative:  75 y.o. female with history of advanced dementia who was recently admitted for right hip fracture discharged to rehab on March 30, 2021-brought to the ED on 9/12 for significant LLE swelling along with nausea/vomiting-patient was found to have extensive LLE DVT and AKI.  She was subsequently admitted to the hospitalist service-see below for further details.  Subjective:  Lying comfortably in bed-appears pleasantly confused.  Objective: Vitals: Blood pressure (!) 104/50, pulse 85, temperature 98.4 F (36.9 C), temperature source Oral, resp. rate 17, height '5\' 5"'$  (1.651 m), weight 69.8 kg, SpO2 94 %.   Exam: Gen Exam: Not in any distress-comfortable. HEENT:atraumatic, normocephalic Chest: B/L clear to auscultation anteriorly CVS:S1S2 regular Abdomen:soft non tender, non distended Extremities: Significant LLE swelling. Neurology: Non focal Skin: no rash   Pertinent labs/radiology: Creatinine: 2.09 WBC: 23.7 K Hb: 11.7  Doppler on 9/13: DVT involving left CFV, SF junction, left femoral vein, left proximal profunda vein, left popliteal vein, left posterior tibial vein, left peroneal vein, left gastrocnemius vein  Assessment & Plan: AKI: Likely hemodynamically mediated-improving with supportive care-avoid nephrotoxic agents.  No hydronephrosis seen on CT abdomen.  UA without proteinuria.  SIRS: Likely noninfectious-suspect this is secondary to inflammation/swelling from extensive LLE DVT.  UA/chest x-ray/blood cultures all negative.  Stop all antimicrobial therapy and monitor.  Thankfully leukocytosis downtrending.  Nausea/vomiting: Resolved-could have had a transient viral syndrome.  CT abdomen negative for any acute abnormalities.  HIDA scan was negative for cholecystitis.  Advance diet as tolerated.  Extensive LLE DVT: Continue heparin  infusion-vascular surgery planning mechanical thrombectomy.  Suspect likely provoked by recent surgery   Recent right hip fracture-s/p ORIF on 8/14: Per last orthopedic note on 8/15-nonweightbearing for right lower extremity (but okay for touchdown weightbearing)  Dementia: Pleasantly confused-continue Aricept/Namenda/BuSpar.  Nutrition Status: Nutrition Problem: Moderate Malnutrition Etiology: chronic illness (dementia) Signs/Symptoms: mild fat depletion, moderate fat depletion, mild muscle depletion, moderate muscle depletion, energy intake < 75% for > or equal to 1 month Interventions: Boost Breeze, MVI, Prostat     DVT prophylaxis: Heparin gtt Code Status: Full Family Communication: Pt in room, family not at bedside  Status is: Inpatient  Remains inpatient appropriate because:Inpatient level of care appropriate due to severity of illness  Dispo: The patient is from: Home              Anticipated d/c is to:  Unclear at this time              Patient currently is not medically stable to d/c.   Difficult to place patient No    Consultants:  Vascular Surgery  Procedures:    Antimicrobials: Anti-infectives (From admission, onward)    Start     Dose/Rate Route Frequency Ordered Stop   04/27/21 0000  ceFEPIme (MAXIPIME) 2 g in sodium chloride 0.9 % 100 mL IVPB        2 g 200 mL/hr over 30 Minutes Intravenous Every 24 hours 04/26/21 2128 05/05/27 2359   04/26/21 2200  metroNIDAZOLE (FLAGYL) IVPB 500 mg        500 mg 100 mL/hr over 60 Minutes Intravenous Every 12 hours 04/26/21 2124     04/26/21 2130  ceFEPIme (MAXIPIME) 2 g in sodium chloride 0.9 % 100 mL IVPB        2 g 200 mL/hr over 30 Minutes Intravenous  Once 04/26/21 2124  Filed Weights   04/27/21 0800 04/27/21 2146  Weight: 64.4 kg 69.8 kg    Data Reviewed: I have personally reviewed following labs and imaging studies  CBC: Recent Labs  Lab 04/26/21 1849 04/27/21 0717 04/28/21 0035  WBC 31.5*  29.5* 23.7*  NEUTROABS 28.1* 26.4*  --   HGB 15.1* 12.3 11.7*  HCT 45.4 36.7 35.5*  MCV 92.1 93.1 92.9  PLT 251 203 [id number]    Basic Metabolic Panel: Recent Labs  Lab 04/26/21 1849 04/27/21 0717 04/28/21 0035  NA 134* 135 135  K 5.0 4.3 4.0  CL 99 99 101  CO2 20* 23 22  GLUCOSE 179* 140* 99  BUN 27* 33* 37*  CREATININE 2.88* 2.70* 2.09*  CALCIUM 10.9* 9.8 9.6    GFR: Estimated Creatinine Clearance: 22.8 mL/min (A) (by C-G formula based on SCr of 2.09 mg/dL (H)). Liver Function Tests: Recent Labs  Lab 04/26/21 1849 04/27/21 0717 04/28/21 0035  AST '21 16 16  '$ ALT '22 17 17  '$ ALKPHOS 158* 109 109  BILITOT 1.1 1.1 1.1  PROT 7.5 5.9* 5.5*  ALBUMIN 3.4* 2.7* 2.5*    Recent Labs  Lab 04/26/21 1849  LIPASE 56*    No results for input(s): AMMONIA in the last 168 hours. Coagulation Profile: No results for input(s): INR, PROTIME in the last 168 hours. Cardiac Enzymes: No results for input(s): CKTOTAL, CKMB, CKMBINDEX, TROPONINI in the last 168 hours. BNP (last 3 results) No results for input(s): PROBNP in the last 8760 hours. HbA1C: No results for input(s): HGBA1C in the last 72 hours. CBG: No results for input(s): GLUCAP in the last 168 hours. Lipid Profile: No results for input(s): CHOL, HDL, LDLCALC, TRIG, CHOLHDL, LDLDIRECT in the last 72 hours. Thyroid Function Tests: No results for input(s): TSH, T4TOTAL, FREET4, T3FREE, THYROIDAB in the last 72 hours. Anemia Panel: No results for input(s): VITAMINB12, FOLATE, FERRITIN, TIBC, IRON, RETICCTPCT in the last 72 hours. Sepsis Labs: Recent Labs  Lab 04/26/21 1850 04/26/21 2050  LATICACIDVEN 3.7* 2.4*     Recent Results (from the past 240 hour(s))  Resp Panel by RT-PCR (Flu A&B, Covid) Nasopharyngeal Swab     Status: None   Collection Time: 04/26/21  6:58 PM   Specimen: Nasopharyngeal Swab; Nasopharyngeal(NP) swabs in vial transport medium  Result Value Ref Range Status   SARS Coronavirus 2 by RT PCR NEGATIVE  NEGATIVE Final    Comment: (NOTE) SARS-CoV-2 target nucleic acids are NOT DETECTED.  The SARS-CoV-2 RNA is generally detectable in upper respiratory specimens during the acute phase of infection. The lowest concentration of SARS-CoV-2 viral copies this assay can detect is 138 copies/mL. A negative result does not preclude SARS-Cov-2 infection and should not be used as the sole basis for treatment or other patient management decisions. A negative result may occur with  improper specimen collection/handling, submission of specimen other than nasopharyngeal swab, presence of viral mutation(s) within the areas targeted by this assay, and inadequate number of viral copies(<138 copies/mL). A negative result must be combined with clinical observations, patient history, and epidemiological information. The expected result is Negative.  Fact Sheet for Patients:  EntrepreneurPulse.com.au  Fact Sheet for Healthcare Providers:  IncredibleEmployment.be  This test is no t yet approved or cleared by the [place] FDA and  has been authorized for detection and/or diagnosis of SARS-CoV-2 by FDA under an Emergency Use Authorization (EUA). This EUA will remain  in effect (meaning this test can be used) for the duration of the COVID-19 declaration  under Section 564(b)(1) of the Act, 21 U.S.C.section 360bbb-3(b)(1), unless the authorization is terminated  or revoked sooner.       Influenza A by PCR NEGATIVE NEGATIVE Final   Influenza B by PCR NEGATIVE NEGATIVE Final    Comment: (NOTE) The Xpert Xpress SARS-CoV-2/FLU/RSV plus assay is intended as an aid in the diagnosis of influenza from Nasopharyngeal swab specimens and should not be used as a sole basis for treatment. Nasal washings and aspirates are unacceptable for Xpert Xpress SARS-CoV-2/FLU/RSV testing.  Fact Sheet for Patients: EntrepreneurPulse.com.au  Fact Sheet for Healthcare  Providers: IncredibleEmployment.be  This test is not yet approved or cleared by the [place] FDA and has been authorized for detection and/or diagnosis of SARS-CoV-2 by FDA under an Emergency Use Authorization (EUA). This EUA will remain in effect (meaning this test can be used) for the duration of the COVID-19 declaration under Section 564(b)(1) of the Act, 21 U.S.C. section 360bbb-3(b)(1), unless the authorization is terminated or revoked.  Performed at [facility], [place] [address], [place], [place] [address]   Blood culture (routine x 2)     Status: None (Preliminary result)   Collection Time: 04/26/21 10:35 PM   Specimen: BLOOD RIGHT FOREARM  Result Value Ref Range Status   Specimen Description BLOOD RIGHT FOREARM  Final   Special Requests   Final    BOTTLES DRAWN AEROBIC AND ANAEROBIC Blood Culture results may not be optimal due to an inadequate volume of blood received in culture bottles   Culture   Final    NO GROWTH 2 DAYS Performed at [facility], [place] [address], [place], [place] [address]    Report Status PENDING  Incomplete      Radiology Studies: CT ABDOMEN PELVIS WO CONTRAST  Result Date: 04/26/2021 CLINICAL DATA:  Abdominal pain, acute, nonlocalized EXAM: CT ABDOMEN AND PELVIS WITHOUT CONTRAST TECHNIQUE: Multidetector CT imaging of the abdomen and pelvis was performed following the standard protocol without IV contrast. Unenhanced CT was performed per clinician order. Lack of IV contrast limits sensitivity and specificity, especially for evaluation of abdominal/pelvic solid viscera. COMPARISON:  09/21/2016 FINDINGS: Lower chest: No acute pleural or parenchymal lung disease. Hepatobiliary: High attenuation material within the gallbladder consistent with sludge. No evidence of calcified gallstones or cholecystitis. Liver is unremarkable. Pancreas: Unremarkable. No pancreatic ductal dilatation or surrounding inflammatory changes. Spleen:  Normal in size without focal abnormality. Adrenals/Urinary Tract: The adrenals are stable. There is a punctate 2 mm nonobstructing calculus within the lower pole left kidney. No right-sided calculi. No obstructive uropathy within either kidney. The bladder is minimally distended, limiting its evaluation. Punctate 2 mm calculus is seen layering dependently within the right lateral aspect of the bladder. Stomach/Bowel: There is a large amount of retained stool within the rectal vault, consistent with fecal impaction. No bowel obstruction or ileus. Normal appendix right lower quadrant. No bowel wall thickening or inflammatory change. Vascular/Lymphatic: Minimal atherosclerosis. There is asymmetric enlargement of the left common iliac, internal iliac, and external iliac veins concerning for deep venous thrombosis. Correlation with left lower extremity Doppler evaluation may be useful. Reproductive: Status post hysterectomy. No adnexal masses. Other: No free fluid or free gas.  No abdominal wall hernia. Musculoskeletal: Postsurgical changes are seen from ORIF of a proximal right femur fracture. Alignment is near anatomic. No other acute or destructive bony lesions. There is diffuse subcutaneous edema within the visualized left lower extremity, likely related to suspected DVT. Reconstructed images demonstrate no additional findings. IMPRESSION:  1. Punctate 2 mm nonobstructing left renal calculus. There is also a punctate 2 mm calculus within the bladder lumen. No obstructive uropathy within either kidney. 2. Suspected deep venous thrombosis involving the left lower extremity. Left lower extremity Doppler ultrasound recommended. 3. Large amount of stool within the rectal vault, consistent with fecal impaction. 4. Suspected gallbladder sludge. No evidence of cholelithiasis or cholecystitis. 5.  Aortic Atherosclerosis (ICD10-I70.0). Critical Value/emergent results were called by telephone at the time of interpretation on  04/26/2021 at 9:19 pm to provider [facility] , who verbally acknowledged these results. Electronically Signed   By: Randa Ngo M.D.   On: 04/26/2021 21:19   NM Hepatobiliary Liver Func  Result Date: 04/27/2021 CLINICAL DATA:  Abdominal pain, nausea, vomiting, diarrhea, constipation EXAM: NUCLEAR MEDICINE HEPATOBILIARY IMAGING TECHNIQUE: Sequential images of the abdomen were obtained out to 60 minutes following intravenous administration of radiopharmaceutical. RADIOPHARMACEUTICALS:  5.3 mCi Tc-21m Choletec IV COMPARISON:  No direct comparison study. Correlation made with same day right upper quadrant ultrasound. FINDINGS: Prompt uptake and biliary excretion of activity by the liver is seen. Gallbladder activity is visualized, consistent with patency of cystic duct. Biliary imaging was aborted after 1 hour as the patient could not tolerate completing the study. Tracer is not seen in the small bowel, the tracer is seen in the large bowel. Gallbladder ejection fraction was not calculated. IMPRESSION: 1. Normal visualization of the gallbladder, excluding acute cholecystitis. 2. The patient could not tolerate completing the study, as such, ejection fraction and evaluation for chronic cholecystitis were not completed. Electronically Signed   By: PValetta MoleM.D.   On: 04/27/2021 14:41   VAS [place]IV[place]/ILIAC (VENOUS ONLY)  Result Date: 04/27/2021 IVC/ILIAC STUDY Patient Name:  JHELEEN WEINBERG Date of Exam:   04/27/2021 Medical Rec #: [id number]     Accession #:    [id number] of Birth: 510/08/1945      Patient Gender: F Patient Age:   89years [facility]      V[facility] [place]IV[place]/ILIAC (VENOUS ONLY) Referring Phys: STEPHEN CHIU --------------------------------------------------------------------------------  Indications: DVT LLE Limitations: Air/bowel gas and patient discomfort.  Comparison Study: CT of abdomen/pelvis showed enlarged left iliac system                   suggestive of  DVT. Performing Technologist: JRogelia RohrerRVT, RDMS  Examination Guidelines: A complete evaluation includes B-mode imaging, spectral Doppler, color Doppler, and power Doppler as needed of all accessible portions of each vessel. Bilateral testing is considered an integral part of a complete examination. Limited examinations for reoccurring indications may be performed as noted.  IVC/Iliac Findings: +----------+------+-----------------+------------------------------------------+    IVC    Patent    Thrombus                      Comments                  +----------+------+-----------------+------------------------------------------+ IVC Prox                         Not visualized due to bowel gas & patient                                   discomfort                                 +----------+------+-----------------+------------------------------------------+  IVC Mid   patent                                                            +----------+------+-----------------+------------------------------------------+ IVC Distal      age indeterminate                                           +----------+------+-----------------+------------------------------------------+  +-------------------+---------+-----------+---------+-----------+--------+         CIV        RT-PatentRT-ThrombusLT-PatentLT-ThrombusComments +-------------------+---------+-----------+---------+-----------+--------+ Common Iliac Prox   patent                         acute            +-------------------+---------+-----------+---------+-----------+--------+ Common Iliac Mid    patent                         acute            +-------------------+---------+-----------+---------+-----------+--------+ Common Iliac Distal patent                         acute            +-------------------+---------+-----------+---------+-----------+--------+   +-------------------+---------+-----------+---------+-----------------+--------+         EIV        RT-PatentRT-ThrombusLT-Patent   LT-Thrombus   Comments +-------------------+---------+-----------+---------+-----------------+--------+ External Iliac Vein patent                      age indeterminate         Prox                                                                      +-------------------+---------+-----------+---------+-----------------+--------+ External Iliac Vein patent                      age indeterminate         Mid                                                                       +-------------------+---------+-----------+---------+-----------------+--------+ External Iliac Vein patent                      age indeterminate         Distal                                                                    +-------------------+---------+-----------+---------+-----------------+--------+  Summary: IVC/Iliac: There is evidence of age indeterminate thrombus involving the IVC. There is no evidence of thrombus involving the right common iliac vein. There is evidence of acute thrombus involving the left common iliac vein. There is no evidence of thrombus involving the right external iliac vein. There is evidence of age indeterminate thrombus involving the left external iliac vein.  *See table(s) above for measurements and observations.  Electronically signed by Harold Barban MD on 04/27/2021 at 7:11:37 PM.    Final    DG Chest Portable 1 View  Result Date: 04/26/2021 CLINICAL DATA:  Right-sided abdominal pain and left leg swelling. EXAM: PORTABLE CHEST 1 VIEW COMPARISON:  March 28, 2021 FINDINGS: Very mild atelectasis is seen within the lateral aspect of the left lung base. There is no evidence of a pleural effusion or pneumothorax. The heart size and mediastinal contours are within normal limits. The visualized skeletal structures are  unremarkable. IMPRESSION: Very mild left basilar atelectasis. Electronically Signed   By: Virgina Norfolk M.D.   On: 04/26/2021 21:48   VAS [name] LOWER EXTREMITY VENOUS (DVT) (ONLY [facility] & [facility])  Result Date: 04/27/2021  Lower Venous DVT Study Patient Name:  ELLAREE BARTHELL  Date of Exam:   04/27/2021 Medical Rec #: [id number]      Accession #:    [id number] Date of Birth: [birth year]       Patient Gender: F Patient Age:   34 years Exam Location[facility]  [facility] Procedure:      VAS [name] LOWER EXTREMITY VENOUS (DVT) Referring Phys: Gean Birchwood --------------------------------------------------------------------------------  Indications: Swelling. Other Indications: Abnormal CT of abdomen/pelvis (enlarged left iliac venous                    system in comparison to right). Risk Factors: Limited mobility post RT hip fracture Surgery Repair of RT hip FX (03/2021) Trauma Fall with RT hip FX 03/2021. Performing Technologist: Rogelia Rohrer RVT, RDMS  Examination Guidelines: A complete evaluation includes B-mode imaging, spectral Doppler, color Doppler, and power Doppler as needed of all accessible portions of each vessel. Bilateral testing is considered an integral part of a complete examination. Limited examinations for reoccurring indications may be performed as noted. The reflux portion of the exam is performed with the patient in reverse Trendelenburg.  +-----+---------------+---------+-----------+----------+--------------+ RIGHTCompressibilityPhasicitySpontaneityPropertiesThrombus Aging +-----+---------------+---------+-----------+----------+--------------+ CFV  Full           Yes      Yes                                 +-----+---------------+---------+-----------+----------+--------------+   +---------+---------------+---------+-----------+----------+------------------+ LEFT     CompressibilityPhasicitySpontaneityPropertiesThrombus Aging      +---------+---------------+---------+-----------+----------+------------------+ CFV      None           No       No                   Acute              +---------+---------------+---------+-----------+----------+------------------+ SFJ      None                                         Acute              +---------+---------------+---------+-----------+----------+------------------+ FV Prox  None  No       No                   Acute              +---------+---------------+---------+-----------+----------+------------------+ FV Mid   None           No       No                   Acute              +---------+---------------+---------+-----------+----------+------------------+ FV DistalNone           No       No                   Acute              +---------+---------------+---------+-----------+----------+------------------+ PFV      None           No       No                   Acute              +---------+---------------+---------+-----------+----------+------------------+ POP      None           No       No                   Acute              +---------+---------------+---------+-----------+----------+------------------+ PTV      None           No       No                   Acute              +---------+---------------+---------+-----------+----------+------------------+ PERO     None           No       No                   Acute              +---------+---------------+---------+-----------+----------+------------------+ Gastroc  None           No       No                   Acute              +---------+---------------+---------+-----------+----------+------------------+ GSV      None           No       No                   Acute - prox/mid                                                         thigh              +---------+---------------+---------+-----------+----------+------------------+     Summary: RIGHT: - No  evidence of common femoral vein obstruction.  LEFT: - Findings consistent with acute deep vein thrombosis involving the left common femoral vein, SF junction, left femoral vein, left proximal profunda vein, left popliteal vein, left posterior tibial veins, left  peroneal veins, and left gastrocnemius veins. - Findings consistent with acute superficial vein thrombosis involving the left great saphenous vein. - No cystic structure found in the popliteal fossa. Subcutaneous edema throughout left lower extremity.  *See table(s) above for measurements and observations. Electronically signed by Harold Barban MD on 04/27/2021 at 7:12:31 PM.    Final    US Abdomen Limited RUQ (LIVER/GB)  Result Date: 04/27/2021 CLINICAL DATA:  Nausea and vomiting. EXAM: ULTRASOUND ABDOMEN LIMITED RIGHT UPPER QUADRANT COMPARISON:  May 14, 2012 FINDINGS: Gallbladder: A mild amount of heterogeneous echogenic sludge is seen within the gallbladder lumen. No gallstones or wall thickening visualized (2.0 mm). A positive sonographic Murphy's sign is noted by sonographer. Common bile duct: Diameter: 3.0 mm Liver: No focal lesion identified. Diffusely increased echogenicity of the liver parenchyma is noted. Portal vein is patent on color Doppler imaging with normal direction of blood flow towards the liver. Other: None. IMPRESSION: 1. Echogenic sludge and a positive sonographic Murphy's sign in the absence of cholelithiasis. Further evaluation with a nuclear medicine hepatobiliary scan is recommended, as acalculous cholecystitis can not completely be excluded. 2. Fatty liver. Electronically Signed   By: Virgina Norfolk M.D.   On: 04/27/2021 00:07    Scheduled Meds:  (feeding supplement) PROSource Plus  30 mL Oral BID BM   busPIRone  10 mg Oral BID   diphenhydrAMINE  25 mg Oral Once   donepezil  10 mg Oral QHS   feeding supplement  1 Container Oral TID BM   memantine  10 mg Oral BID   multivitamin with minerals  1 tablet Oral Daily    Continuous Infusions:  ceFEPime (MAXIPIME) IV     ceFEPime (MAXIPIME) IV 2 g (04/28/21 0143)   heparin 1,100 Units/hr (04/28/21 1502)   lactated ringers 100 mL/hr at 04/28/21 0312   metronidazole 500 mg (04/28/21 0903)   promethazine (PHENERGAN) injection (IM or IVPB) 6.25 mg (04/28/21 0045)     LOS: 2 days   Oren Binet, MD [facility] Pager On Amion  If 7PM-7AM, please contact night-coverage 04/28/2021, 4:16 PM

## 2021-04-28 NOTE — Plan of Care (Signed)
  Problem: Education: Goal: Knowledge of General Education information will improve Description Including pain rating scale, medication(s)/side effects and non-pharmacologic comfort measures Outcome: Progressing   Problem: Health Behavior/Discharge Planning: Goal: Ability to manage health-related needs will improve Outcome: Progressing   

## 2021-04-29 DIAGNOSIS — E440 Moderate protein-calorie malnutrition: Secondary | ICD-10-CM | POA: Diagnosis not present

## 2021-04-29 DIAGNOSIS — N179 Acute kidney failure, unspecified: Secondary | ICD-10-CM | POA: Diagnosis not present

## 2021-04-29 DIAGNOSIS — R112 Nausea with vomiting, unspecified: Secondary | ICD-10-CM | POA: Diagnosis not present

## 2021-04-29 DIAGNOSIS — F0390 Unspecified dementia without behavioral disturbance: Secondary | ICD-10-CM | POA: Diagnosis not present

## 2021-04-29 DIAGNOSIS — I82402 Acute embolism and thrombosis of unspecified deep veins of left lower extremity: Secondary | ICD-10-CM | POA: Diagnosis not present

## 2021-04-29 LAB — BASIC METABOLIC PANEL
Anion gap: 11 (ref 5–15)
BUN: 38 mg/dL — ABNORMAL HIGH (ref 8–23)
CO2: 24 mmol/L (ref 22–32)
Calcium: 9.5 mg/dL (ref 8.9–10.3)
Chloride: 103 mmol/L (ref 98–111)
Creatinine, Ser: 1.41 mg/dL — ABNORMAL HIGH (ref 0.44–1.00)
GFR, Estimated: 39 mL/min — ABNORMAL LOW (ref >60)
Glucose, Bld: 87 mg/dL (ref 70–99)
Potassium: 3.8 mmol/L (ref 3.5–5.1)
Sodium: 138 mmol/L (ref 135–145)

## 2021-04-29 LAB — CBC
HCT: 36 % (ref 36.0–46.0)
Hemoglobin: 11.8 g/dL — ABNORMAL LOW (ref 12.0–15.0)
MCH: 30.8 pg (ref 26.0–34.0)
MCHC: 32.8 g/dL (ref 30.0–36.0)
MCV: 94 fL (ref 80.0–100.0)
Platelets: 224 10*3/uL (ref 150–400)
RBC: 3.83 MIL/uL — ABNORMAL LOW (ref 3.87–5.11)
RDW: 13.6 % (ref 11.5–15.5)
WBC: 14.4 10*3/uL — ABNORMAL HIGH (ref 4.0–10.5)
nRBC: 0 % (ref 0.0–0.2)

## 2021-04-29 LAB — HEPARIN LEVEL (UNFRACTIONATED): Heparin Unfractionated: 0.57 IU/mL (ref 0.30–0.70)

## 2021-04-29 MED ORDER — BISACODYL 10 MG RE SUPP
10.0000 mg | Freq: Once | RECTAL | Status: AC
  Administered 2021-04-29: 10 mg via RECTAL
  Filled 2021-04-29: qty 1

## 2021-04-29 MED ORDER — POLYETHYLENE GLYCOL 3350 17 G PO PACK
17.0000 g | PACK | Freq: Every day | ORAL | Status: DC
  Administered 2021-04-29 – 2021-05-01 (×3): 17 g via ORAL
  Filled 2021-04-29 (×3): qty 1

## 2021-04-29 MED ORDER — BISACODYL 10 MG RE SUPP: 10.0000 mg | Freq: Every day | RECTAL | Status: DC | PRN

## 2021-04-29 MED ORDER — SENNA 8.6 MG PO TABS
1.0000 | ORAL_TABLET | Freq: Every day | ORAL | Status: DC
  Administered 2021-04-29 – 2021-05-01 (×3): 8.6 mg via ORAL
  Filled 2021-04-29 (×3): qty 1

## 2021-04-29 NOTE — TOC Benefit Eligibility Note (Signed)
Transition of Care [facility]) Benefit Eligibility Note    Patient Details  Name: Wendy Watts MRN: [id number] Date of Birth: 03-Jan-1946   Medication/Dose: ELIQUIS  5 MG BID  CO-PAY- $ 45.00 and   XARELTO  10 MG BID : CO-PAY - $45.00  Covered?: Yes  Tier: 3 Drug  Prescription Coverage Preferred Pharmacy: Roseanne [facility]aufman with Person/Company/Phone Number:: MYRA   @  [employer] [id number] #  [phone]  Co-Pay: $ 45.00  Prior Approval: No  Deductible:  (NO DEDUCTIBLE WITH PLAN   /  OUT-OF-POCKET :UNMET)  Additional Notes: LOVENOX  80 MG BID SYRINGES : NOT COVET / NON-FORMULARY   /   ENOXAPARIN 80 MG BID : COVER-YES  , CO-PAY- $100.00  , TIER- 4 DRUG , P/A-NO   and  ( NO 70 MG BID )    Memory Argue Phone Number: 04/29/2021, 3:38 PM

## 2021-04-29 NOTE — Progress Notes (Addendum)
Progress Note    04/29/2021 7:19 AM * No surgery found *  Subjective:  Asking for water. Denies leg pain   Vitals:   04/28/21 2322 04/29/21 0400  BP: 106/64 135/73  Pulse: 79 92  Resp: 19 16  Temp: 99 F (37.2 C) 98.7 F (37.1 C)  SpO2: 97% 96%    Physical Exam: General appearance: Awake, alert in no apparent distress. Pleasantly demented. Cardiac: Heart rate and rhythm are regular Respirations: Nonlabored Extremities: LLE: moderate edema. Calf is soft. Nontender. 2+ DP pulse. Leg not elevated.     CBC    Component Value Date/Time   WBC 14.4 (H) 04/29/2021 0048   RBC 3.83 (L) 04/29/2021 0048   HGB 11.8 (L) 04/29/2021 0048   HGB 13.7 05/08/2019 1156   HCT 36.0 04/29/2021 0048   HCT 41.4 05/08/2019 1156   PLT 224 04/29/2021 0048   PLT 357 05/08/2019 1156   MCV 94.0 04/29/2021 0048   MCV 92 05/08/2019 1156   MCH 30.8 04/29/2021 0048   MCHC 32.8 04/29/2021 0048   RDW 13.6 04/29/2021 0048   RDW 12.5 05/08/2019 1156   LYMPHSABS 0.8 04/27/2021 0717   MONOABS 2.0 (H) 04/27/2021 0717   EOSABS 0.0 04/27/2021 0717   BASOSABS 0.1 04/27/2021 0717    BMET    Component Value Date/Time   NA 135 04/28/2021 0035   NA 142 05/08/2019 1156   K 4.0 04/28/2021 0035   CL 101 04/28/2021 0035   CO2 22 04/28/2021 0035   GLUCOSE 99 04/28/2021 0035   BUN 37 (H) 04/28/2021 0035   BUN 11 05/08/2019 1156   CREATININE 2.09 (H) 04/28/2021 0035   CALCIUM 9.6 04/28/2021 0035   GFRNONAA 24 (L) 04/28/2021 0035   GFRAA 72 05/08/2019 1156     Intake/Output Summary (Last 24 hours) at 04/29/2021 0719 Last data filed at 04/29/2021 0414 Gross per 24 hour  Intake 1900.3 ml  Output --  Net 1900.3 ml    HOSPITAL MEDICATIONS Scheduled Meds:  (feeding supplement) PROSource Plus  30 mL Oral BID BM   busPIRone  10 mg Oral BID   diphenhydrAMINE  25 mg Oral Once   donepezil  10 mg Oral QHS   feeding supplement  1 Container Oral TID BM   memantine  10 mg Oral BID   multivitamin with  minerals  1 tablet Oral Daily   Continuous Infusions:  heparin 1,100 Units/hr (04/28/21 1502)   promethazine (PHENERGAN) injection (IM or IVPB) 6.25 mg (04/28/21 0045)   PRN Meds:.acetaminophen **OR** acetaminophen, promethazine (PHENERGAN) injection (IM or IVPB)  Assessment and Plan: LLE DVT with recent history of right hip surgery. Remains on heparin infusion. Thrombus  involves IVC, iliac veins and distally.  On heparin infusion. Chemistries not repeated yet this morning. Scr was 2.0 yesterday. She remains afebrile with steadily decreasing WBC. Platelets 224k. On cefepime and metronidazole. Will continue to follow clinical course with possibility of mechanical thrombectomy. Placed order for BMP  Per RN, daughter would like to be updated today on regards to possible intervention. Her phone number is written on patient's whiteboard.  -DVT prophylaxis:  heparin infusion   Wendy Grill, PA-C [facility] [phone] 04/29/2021  7:19 AM    I agree with the above.  I have seen and examined the patient.  Her creatinine is trending down.  She does not complain of abdominal pain.  Plan for venous thrombectomy once creatinine back to baseline, possible Tuesday.  Daughter updated via phone  Wendy Watts  Wendy Watts

## 2021-04-29 NOTE — Progress Notes (Signed)
PROGRESS NOTE    Wendy Watts  [id number] DOB: [birth year] DOA: 04/26/2021 PCP: [name], MD    Brief Narrative:  75 y.o. female with history of advanced dementia who was recently admitted for right hip fracture discharged to rehab on March 30, 2021-brought to the ED on 9/12 for significant LLE swelling along with nausea/vomiting-patient was found to have extensive LLE DVT and AKI.  She was subsequently admitted to the hospitalist service-see below for further details.  Subjective:  Remains pleasantly confused.  Some decrease in lower extremity edema overnight.  Objective: Vitals: Blood pressure 116/64, pulse 96, temperature 97.6 F (36.4 C), temperature source Oral, resp. rate 12, height '5\' 5"'$  (1.651 m), weight 69.8 kg, SpO2 96 %.   Exam: Gen Exam:Alert awake-not in any distress HEENT:atraumatic, normocephalic Chest: B/L clear to auscultation anteriorly CVS:S1S2 regular Abdomen:soft non tender, non distended Extremities: Decreased left lower extremity edema today. Neurology: Non focal Skin: no rash   Pertinent labs/radiology: WBC: 14.4 Hb: 11.8  Doppler on 9/13: DVT involving left CFV, SF junction, left femoral vein, left proximal profunda vein, left popliteal vein, left posterior tibial vein, left peroneal vein, left gastrocnemius vein  Assessment & Plan: AKI: Hemodynamically mediated in the setting of nausea/vomiting-no hydronephrosis seen on CT abdomen-UA without proteinuria.  Continue supportive care-avoid nephrotoxic agents-awaiting repeat chemistry panel today.   SIRS: Likely noninfectious-suspect this is secondary to inflammation/swelling from extensive LLE DVT.  UA/chest x-ray/blood cultures all negative.  All antimicrobial therapy was discontinued on 9/14-remains afebrile-leukocytosis continues to downtrend.   Nausea/vomiting: Resolved-could have had a transient viral syndrome.  CT abdomen negative for any acute abnormalities.  HIDA scan was negative for  cholecystitis.  Continue to advance diet as tolerated.  Extensive LLE DVT: Continue heparin infusion-vascular surgery planning mechanical thrombectomy.  Suspect likely provoked by recent surgery   Constipation: Significant stool burden seen on CT abdomen-given Dulcolax suppository-we will now maintain on MiraLAX/Senokot.  Recent right hip fracture-s/p ORIF on 8/14: Per last orthopedic note on 8/15-nonweightbearing for right lower extremity (but okay for touchdown weightbearing)  Dementia: Pleasantly confused-continue Aricept/Namenda/BuSpar.  Failure to thrive syndrome: Per patient's daughter-ever since she had recent hip fracture-her appetite has been poor-and has been failing to thrive syndrome.  We will advance her diet and see how she does.  If needed-start Remeron.  Nutrition Status: Nutrition Problem: Moderate Malnutrition Etiology: chronic illness (dementia) Signs/Symptoms: mild fat depletion, moderate fat depletion, mild muscle depletion, moderate muscle depletion, energy intake < 75% for > or equal to 1 month Interventions: Boost Breeze, MVI, Prostat     DVT prophylaxis: Heparin gtt Code Status: Full Family Communication: Daughter-[phone]-updated over the phone on 9/15  Status is: Inpatient  Remains inpatient appropriate because:Inpatient level of care appropriate due to severity of illness  Dispo: The patient is from: Home              Anticipated d/c is to:  Unclear at this time              Patient currently is not medically stable to d/c.   Difficult to place patient No    Consultants:  Vascular Surgery  Procedures:    Antimicrobials: Anti-infectives (From admission, onward)    Start     Dose/Rate Route Frequency Ordered Stop   04/27/21 0000  ceFEPIme (MAXIPIME) 2 g in sodium chloride 0.9 % 100 mL IVPB  Status:  Discontinued        2 g 200 mL/hr over 30 Minutes Intravenous Every 24 hours  04/26/21 2128 04/28/21 1626   04/26/21 2200  metroNIDAZOLE  (FLAGYL) IVPB 500 mg  Status:  Discontinued        500 mg 100 mL/hr over 60 Minutes Intravenous Every 12 hours 04/26/21 2124 04/28/21 1626   04/26/21 2130  ceFEPIme (MAXIPIME) 2 g in sodium chloride 0.9 % 100 mL IVPB  Status:  Discontinued        2 g 200 mL/hr over 30 Minutes Intravenous  Once 04/26/21 2124 04/28/21 1626       Filed Weights   04/27/21 0800 04/27/21 2146  Weight: 64.4 kg 69.8 kg    Data Reviewed: I have personally reviewed following labs and imaging studies  CBC: Recent Labs  Lab 04/26/21 1849 04/27/21 0717 04/28/21 0035 04/29/21 0048  WBC 31.5* 29.5* 23.7* 14.4*  NEUTROABS 28.1* 26.4*  --   --   HGB 15.1* 12.3 11.7* 11.8*  HCT 45.4 36.7 35.5* 36.0  MCV 92.1 93.1 92.9 94.0  PLT 251 203 206 [id number]    Basic Metabolic Panel: Recent Labs  Lab 04/26/21 1849 04/27/21 0717 04/28/21 0035  NA 134* 135 135  K 5.0 4.3 4.0  CL 99 99 101  CO2 20* 23 22  GLUCOSE 179* 140* 99  BUN 27* 33* 37*  CREATININE 2.88* 2.70* 2.09*  CALCIUM 10.9* 9.8 9.6    GFR: Estimated Creatinine Clearance: 22.8 mL/min (A) (by C-G formula based on SCr of 2.09 mg/dL (H)). Liver Function Tests: Recent Labs  Lab 04/26/21 1849 04/27/21 0717 04/28/21 0035  AST '21 16 16  '$ ALT '22 17 17  '$ ALKPHOS 158* 109 109  BILITOT 1.1 1.1 1.1  PROT 7.5 5.9* 5.5*  ALBUMIN 3.4* 2.7* 2.5*    Recent Labs  Lab 04/26/21 1849  LIPASE 56*    No results for input(s): AMMONIA in the last 168 hours. Coagulation Profile: No results for input(s): INR, PROTIME in the last 168 hours. Cardiac Enzymes: No results for input(s): CKTOTAL, CKMB, CKMBINDEX, TROPONINI in the last 168 hours. BNP (last 3 results) No results for input(s): PROBNP in the last 8760 hours. HbA1C: No results for input(s): HGBA1C in the last 72 hours. CBG: No results for input(s): GLUCAP in the last 168 hours. Lipid Profile: No results for input(s): CHOL, HDL, LDLCALC, TRIG, CHOLHDL, LDLDIRECT in the last 72 hours. Thyroid  Function Tests: No results for input(s): TSH, T4TOTAL, FREET4, T3FREE, THYROIDAB in the last 72 hours. Anemia Panel: No results for input(s): VITAMINB12, FOLATE, FERRITIN, TIBC, IRON, RETICCTPCT in the last 72 hours. Sepsis Labs: Recent Labs  Lab 04/26/21 1850 04/26/21 2050  LATICACIDVEN 3.7* 2.4*     Recent Results (from the past 240 hour(s))  Resp Panel by RT-PCR (Flu A&B, Covid) Nasopharyngeal Swab     Status: None   Collection Time: 04/26/21  6:58 PM   Specimen: Nasopharyngeal Swab; Nasopharyngeal(NP) swabs in vial transport medium  Result Value Ref Range Status   SARS Coronavirus 2 by RT PCR NEGATIVE NEGATIVE Final    Comment: (NOTE) SARS-CoV-2 target nucleic acids are NOT DETECTED.  The SARS-CoV-2 RNA is generally detectable in upper respiratory specimens during the acute phase of infection. The lowest concentration of SARS-CoV-2 viral copies this assay can detect is 138 copies/mL. A negative result does not preclude SARS-Cov-2 infection and should not be used as the sole basis for treatment or other patient management decisions. A negative result may occur with  improper specimen collection/handling, submission of specimen other than nasopharyngeal swab, presence of viral mutation(s) within the areas targeted  by this assay, and inadequate number of viral copies(<138 copies/mL). A negative result must be combined with clinical observations, patient history, and epidemiological information. The expected result is Negative.  Fact Sheet for Patients:  EntrepreneurPulse.com.au  Fact Sheet for Healthcare Providers:  IncredibleEmployment.be  This test is no t yet approved or cleared by the [place] FDA and  has been authorized for detection and/or diagnosis of SARS-CoV-2 by FDA under an Emergency Use Authorization (EUA). This EUA will remain  in effect (meaning this test can be used) for the duration of the COVID-19 declaration  under Section 564(b)(1) of the Act, 21 U.S.C.section 360bbb-3(b)(1), unless the authorization is terminated  or revoked sooner.       Influenza A by PCR NEGATIVE NEGATIVE Final   Influenza B by PCR NEGATIVE NEGATIVE Final    Comment: (NOTE) The Xpert Xpress SARS-CoV-2/FLU/RSV plus assay is intended as an aid in the diagnosis of influenza from Nasopharyngeal swab specimens and should not be used as a sole basis for treatment. Nasal washings and aspirates are unacceptable for Xpert Xpress SARS-CoV-2/FLU/RSV testing.  Fact Sheet for Patients: EntrepreneurPulse.com.au  Fact Sheet for Healthcare Providers: IncredibleEmployment.be  This test is not yet approved or cleared by the [place] FDA and has been authorized for detection and/or diagnosis of SARS-CoV-2 by FDA under an Emergency Use Authorization (EUA). This EUA will remain in effect (meaning this test can be used) for the duration of the COVID-19 declaration under Section 564(b)(1) of the Act, 21 U.S.C. section 360bbb-3(b)(1), unless the authorization is terminated or revoked.  Performed at [facility], [place] [address], [place], [place] [address]   Blood culture (routine x 2)     Status: None (Preliminary result)   Collection Time: 04/26/21 10:35 PM   Specimen: BLOOD RIGHT FOREARM  Result Value Ref Range Status   Specimen Description BLOOD RIGHT FOREARM  Final   Special Requests   Final    BOTTLES DRAWN AEROBIC AND ANAEROBIC Blood Culture results may not be optimal due to an inadequate volume of blood received in culture bottles   Culture   Final    NO GROWTH 3 DAYS Performed at [facility], [place] [address] [place], [place] [address]    Report Status PENDING  Incomplete      Radiology Studies: NM Hepatobiliary Liver Func  Result Date: 04/27/2021 CLINICAL DATA:  Abdominal pain, nausea, vomiting, diarrhea, constipation EXAM: NUCLEAR MEDICINE HEPATOBILIARY IMAGING  TECHNIQUE: Sequential images of the abdomen were obtained out to 60 minutes following intravenous administration of radiopharmaceutical. RADIOPHARMACEUTICALS:  5.3 mCi Tc-78m Choletec IV COMPARISON:  No direct comparison study. Correlation made with same day right upper quadrant ultrasound. FINDINGS: Prompt uptake and biliary excretion of activity by the liver is seen. Gallbladder activity is visualized, consistent with patency of cystic duct. Biliary imaging was aborted after 1 hour as the patient could not tolerate completing the study. Tracer is not seen in the small bowel, the tracer is seen in the large bowel. Gallbladder ejection fraction was not calculated. IMPRESSION: 1. Normal visualization of the gallbladder, excluding acute cholecystitis. 2. The patient could not tolerate completing the study, as such, ejection fraction and evaluation for chronic cholecystitis were not completed. Electronically Signed   By: PValetta MoleM.D.   On: 04/27/2021 14:41    Scheduled Meds:  (feeding supplement) PROSource Plus  30 mL Oral BID BM   busPIRone  10 mg Oral BID   diphenhydrAMINE  25 mg Oral Once  donepezil  10 mg Oral QHS   feeding supplement  1 Container Oral TID BM   memantine  10 mg Oral BID   multivitamin with minerals  1 tablet Oral Daily   Continuous Infusions:  heparin 1,100 Units/hr (04/29/21 1352)   promethazine (PHENERGAN) injection (IM or IVPB) 6.25 mg (04/28/21 0045)     LOS: 3 days   Oren Binet, MD [facility] Pager On [place]  If 7PM-7AM, please contact night-coverage 04/29/2021, 1:59 PM

## 2021-04-29 NOTE — TOC Initial Note (Addendum)
Transition of Care [facility]) - Initial/Assessment Note    Patient Details  Name: Wendy Watts MRN: [id number] Date of Birth: [birth year]  [facility]) CM/SW Contact:    Verdell Carmine, RN Phone Number: 04/29/2021, 2:42 PM  Clinical Narrative:                   75 year old with a hip fracture in August , and from rehab after that post surgical, now presents with DVT.  Eligibility sent for DOAC and lovenox. Marland Kitchen Has been at [facility] rehab a month will converse with CSW to follow for potential back to SNF. If patient has been there 30 days already she may ave a significant copay. Marland Kitchen Spoke to husband  via phone. He stated she stayed at [facility] for 20 days, then came home with home health, he cannot remember the name of the agency. , she has wheelchair, walker, BSC,  and shower chair at home. No further DME needed at this time. He would like for her to come home unless she really needs SNF.   CM and CSW to follow fo needs , recommendations, and transitions.     Expected Discharge Plan: Skilled Nursing Facility Barriers to Discharge: Continued Medical Work up   Patient Goals and CMS Choice        Expected Discharge Plan and Services Expected Discharge Plan: [place] In-house Referral: Clinical Social Work Discharge Planning Services: CM Consult   Living arrangements for the past 2 months: [place]                                      Prior Living Arrangements/Services Living arrangements for the past 2 months: [place] Lives with:: Spouse Patient language and need for interpreter reviewed:: Yes        Need for Family Participation in Patient Care: Yes (Comment) Care giver support system in place?: Yes (comment)   Criminal Activity/Legal Involvement Pertinent to Current Situation/Hospitalization: No - Comment as needed  Activities of Daily Living Home Assistive Devices/Equipment: None ADL Screening (condition  at time of admission) Patient's cognitive ability adequate to safely complete daily activities?: No Is the patient deaf or have difficulty hearing?: Yes Does the patient have difficulty seeing, even when wearing glasses/contacts?: Yes Does the patient have difficulty concentrating, remembering, or making decisions?: Yes Patient able to express need for assistance with ADLs?: Yes Does the patient have difficulty dressing or bathing?: Yes Independently performs ADLs?: No Communication: Independent Dressing (OT): Needs assistance, Dependent Is this a change from baseline?: Pre-admission baseline Grooming: Dependent, Needs assistance Is this a change from baseline?: Pre-admission baseline Feeding: Needs assistance, Dependent Is this a change from baseline?: Pre-admission baseline Bathing: Needs assistance, Dependent Is this a change from baseline?: Pre-admission baseline Toileting: Dependent, Needs assistance Is this a change from baseline?: Pre-admission baseline In/Out Bed: Dependent, Needs assistance Is this a change from baseline?: Pre-admission baseline Walks in Home: Dependent, Needs assistance Is this a change from baseline?: Pre-admission baseline Does the patient have difficulty walking or climbing stairs?: Yes Weakness of Legs: Both Weakness of Arms/Hands: None  Permission Sought/Granted                  Emotional Assessment       Orientation: : Fluctuating Orientation (Suspected and/or reported Sundowners) Alcohol / Substance Use: Not Applicable Psych Involvement: No (comment)  Admission  diagnosis:  ARF (acute renal failure) (HCC) [N17.9] Nausea & vomiting [R11.2] Acute renal failure (ARF) (HCC) [N17.9] Patient Active Problem List   Diagnosis Date Noted   Malnutrition of moderate degree 04/28/2021   Acute renal failure (ARF) ([place]) 04/26/2021   SIRS (systemic inflammatory response syndrome) ([place]) 04/26/2021   Nausea & vomiting 04/26/2021   ARF (acute renal  failure) ([place]) 04/26/2021   Dementia without behavioral disturbance ([place]) 05/08/2019   PCP:  [name], MD Pharmacy:   [facility] [place], [place] AT [address] [place] [address] Phone: [phone] Fax: [phone]     Social Determinants of Health (SDOH) Interventions    Readmission Risk Interventions No flowsheet data found.

## 2021-04-29 NOTE — Plan of Care (Signed)
  Problem: Education: Goal: Knowledge of General Education information will improve Description Including pain rating scale, medication(s)/side effects and non-pharmacologic comfort measures Outcome: Progressing   Problem: Health Behavior/Discharge Planning: Goal: Ability to manage health-related needs will improve Outcome: Progressing   

## 2021-04-29 NOTE — Progress Notes (Signed)
ANTICOAGULATION CONSULT NOTE - Follow Up Consult  Pharmacy Consult for heparin Indication:  VTE treatment  Allergies  Allergen Reactions   Allegra [Fexofenadine Hcl] Other (See Comments)    "takes me to another world"   Ativan [Lorazepam] Other (See Comments)    Hallucinations   Azithromycin Nausea And Vomiting   Codeine Hives   Doxycycline Hyclate Nausea Only   Penicillins Hives    Has patient had a PCN reaction causing immediate rash, facial/tongue/throat swelling, SOB or lightheadedness with hypotension: no Has patient had a PCN reaction causing severe rash involving mucus membranes or skin necrosis: no Has patient had a PCN reaction that required hospitalization: no Has patient had a PCN reaction occurring within the last 10 years: no If all of the above answers are "NO", then may proceed with Cephalosporin use.    Trazodone And Nefazodone Nausea Only   Zyrtec [Cetirizine] Other (See Comments)    "takes me to another world"   Patient Measurements: Heparin Dosing Weight: 65 kg   Vital Signs: Temp: 98.7 F (37.1 C) (09/15 0400) Temp Source: Axillary (09/15 0400) BP: 135/73 (09/15 0400) Pulse Rate: 92 (09/15 0400)  Labs: Recent Labs    04/26/21 1849 04/27/21 0717 04/27/21 1629 04/28/21 0035 04/28/21 1247 04/28/21 2033 04/29/21 0048  HGB 15.1* 12.3  --  11.7*  --   --  11.8*  HCT 45.4 36.7  --  35.5*  --   --  36.0  PLT 251 203  --  206  --   --  224  HEPARINUNFRC  --  0.62   < > 0.23* 0.41 0.50 0.57  CREATININE 2.88* 2.70*  --  2.09*  --   --   --    < > = values in this interval not displayed.    Estimated Creatinine Clearance: 22.8 mL/min (A) (by C-G formula based on SCr of 2.09 mg/dL (H)).  Medical History: Past Medical History:  Diagnosis Date   Anxiety    Dementia ([place])    High cholesterol    Vertigo    Medications: see MAR  Assessment: 33 YOF who recently had a R hip fracture s/p surgery here with acute DVT to start IV heparin for VTE treatment  (acute L CIV thrombus on ultrasound).   Heparin level remains therapeutic at 0.57 on 1100 units/hr.  CBC stable. SCr improving as of 9/14. No bleeding or issues with infusion noted.   Goal of Therapy:  Heparin level 0.3-0.7 units/ml Monitor platelets by anticoagulation protocol: Yes   Plan:  Continue heparin infusion at 1100 units/hr  Daily heparin level, CBC Monitor for s/s of bleeding  Follow-up transition to DOAC   Thank you for allowing [name] to participate in this patients care. Jens Som, PharmD 04/29/2021 10:25 AM  **Pharmacist phone directory can be found on [email or web address] listed under [place]**

## 2021-04-29 NOTE — Care Management Important Message (Signed)
Important Message  Patient Details  Name: AUGUST LABRECK MRN: [id number] Date of Birth: 29-Jun-1946   Medicare Important Message Given:  Yes     Orbie Pyo 04/29/2021, 3:38 PM

## 2021-04-30 DIAGNOSIS — R112 Nausea with vomiting, unspecified: Secondary | ICD-10-CM | POA: Diagnosis not present

## 2021-04-30 DIAGNOSIS — F0390 Unspecified dementia without behavioral disturbance: Secondary | ICD-10-CM | POA: Diagnosis not present

## 2021-04-30 DIAGNOSIS — I82402 Acute embolism and thrombosis of unspecified deep veins of left lower extremity: Secondary | ICD-10-CM | POA: Diagnosis not present

## 2021-04-30 DIAGNOSIS — E440 Moderate protein-calorie malnutrition: Secondary | ICD-10-CM | POA: Diagnosis not present

## 2021-04-30 DIAGNOSIS — N179 Acute kidney failure, unspecified: Secondary | ICD-10-CM | POA: Diagnosis not present

## 2021-04-30 LAB — CBC
HCT: 33.2 % — ABNORMAL LOW (ref 36.0–46.0)
Hemoglobin: 10.9 g/dL — ABNORMAL LOW (ref 12.0–15.0)
MCH: 30.8 pg (ref 26.0–34.0)
MCHC: 32.8 g/dL (ref 30.0–36.0)
MCV: 93.8 fL (ref 80.0–100.0)
Platelets: 265 10*3/uL (ref 150–400)
RBC: 3.54 MIL/uL — ABNORMAL LOW (ref 3.87–5.11)
RDW: 13.5 % (ref 11.5–15.5)
WBC: 14.4 10*3/uL — ABNORMAL HIGH (ref 4.0–10.5)
nRBC: 0 % (ref 0.0–0.2)

## 2021-04-30 LAB — BASIC METABOLIC PANEL
Anion gap: 9 (ref 5–15)
BUN: 31 mg/dL — ABNORMAL HIGH (ref 8–23)
CO2: 24 mmol/L (ref 22–32)
Calcium: 10 mg/dL (ref 8.9–10.3)
Chloride: 107 mmol/L (ref 98–111)
Creatinine, Ser: 1.17 mg/dL — ABNORMAL HIGH (ref 0.44–1.00)
GFR, Estimated: 49 mL/min — ABNORMAL LOW (ref >60)
Glucose, Bld: 102 mg/dL — ABNORMAL HIGH (ref 70–99)
Potassium: 3.8 mmol/L (ref 3.5–5.1)
Sodium: 140 mmol/L (ref 135–145)

## 2021-04-30 LAB — HEPARIN LEVEL (UNFRACTIONATED): Heparin Unfractionated: 0.53 IU/mL (ref 0.30–0.70)

## 2021-04-30 NOTE — Progress Notes (Signed)
ANTICOAGULATION CONSULT NOTE - Follow Up Consult  Pharmacy Consult for heparin Indication:  VTE treatment  Allergies  Allergen Reactions   Allegra [Fexofenadine Hcl] Other (See Comments)    "takes me to another world"   Ativan [Lorazepam] Other (See Comments)    Hallucinations   Azithromycin Nausea And Vomiting   Codeine Hives   Doxycycline Hyclate Nausea Only   Penicillins Hives    Has patient had a PCN reaction causing immediate rash, facial/tongue/throat swelling, SOB or lightheadedness with hypotension: no Has patient had a PCN reaction causing severe rash involving mucus membranes or skin necrosis: no Has patient had a PCN reaction that required hospitalization: no Has patient had a PCN reaction occurring within the last 10 years: no If all of the above answers are "NO", then may proceed with Cephalosporin use.    Trazodone And Nefazodone Nausea Only   Zyrtec [Cetirizine] Other (See Comments)    "takes me to another world"   Patient Measurements: Heparin Dosing Weight: 65 kg   Vital Signs: Temp: 98.3 F (36.8 C) (09/16 0458) Temp Source: Oral (09/16 0458)  Labs: Recent Labs    04/28/21 0035 04/28/21 1247 04/28/21 2033 04/29/21 0048 04/30/21 0127  HGB 11.7*  --   --  11.8* 10.9*  HCT 35.5*  --   --  36.0 33.2*  PLT 206  --   --  224 265  HEPARINUNFRC 0.23*   < > 0.50 0.57 0.53  CREATININE 2.09*  --   --  1.41* 1.17*   < > = values in this interval not displayed.    Estimated Creatinine Clearance: 40.7 mL/min (A) (by C-G formula based on SCr of 1.17 mg/dL (H)).  Medical History: Past Medical History:  Diagnosis Date   Anxiety    Dementia ([place])    High cholesterol    Vertigo    Medications: see MAR  Assessment: Wendy Watts who recently had a R hip fracture s/p surgery here with acute DVT to start IV heparin for VTE treatment (acute L CIV thrombus on ultrasound).   Heparin level remains therapeutic at 0.53 on 1100 units/hr.  CBC stable. SCr improving to  1.17 (baseline < 1). No bleeding or issues with infusion noted.   Goal of Therapy:  Heparin level 0.3-0.7 units/ml Monitor platelets by anticoagulation protocol: Yes   Plan:  Continue heparin infusion at 1100 units/hr  Daily heparin level, CBC Monitor for s/s of bleeding  Follow-up transition to DOAC   Thank you for allowing [name] to participate in this patients care. Jens Som, PharmD 04/30/2021 7:23 AM  **Pharmacist phone directory can be found on [email or web address] listed under [place]**

## 2021-04-30 NOTE — Progress Notes (Signed)
Patient is currently on the schedule Tuesday for mechanical thrombectomy of her left leg DVT, assuming she is medically stable and her renal issues resolved.  I will follow-up on Monday to confirm.  Wendy Watts

## 2021-04-30 NOTE — Progress Notes (Signed)
PROGRESS NOTE    Wendy Watts  [id number] DOB: 08-Jun-1946 DOA: 04/26/2021 PCP: [name], MD    Brief Narrative:  75 y.o. female with history of advanced dementia who was recently admitted for right hip fracture discharged to rehab on March 30, 2021-brought to the ED on 9/12 for significant LLE swelling along with nausea/vomiting-patient was found to have extensive LLE DVT and AKI.  She was subsequently admitted to the hospitalist service-see below for further details.  Subjective:  Remains pleasantly confused-no major issues overnight-left lower extremity edema continues to improve.  Objective: Vitals: Blood pressure 95/62, pulse 75, temperature 97.9 F (36.6 C), temperature source Oral, resp. rate 16, height '5\' 5"'$  (1.651 m), weight 69.8 kg, SpO2 91 %.   Exam: Gen Exam:Alert awake-not in any distress HEENT:atraumatic, normocephalic Chest: B/L clear to auscultation anteriorly CVS:S1S2 regular Abdomen:soft non tender, non distended Extremities: Mild left lower extremity edema. Neurology: Non focal Skin: no rash   Pertinent labs/radiology: WBC: 14.4 Hb: 10.9 Creatinine:1.17  Doppler on 9/13: DVT involving left CFV, SF junction, left femoral vein, left proximal profunda vein, left popliteal vein, left posterior tibial vein, left peroneal vein, left gastrocnemius vein  Assessment & Plan: AKI: Hemodynamically mediated in the setting of nausea/vomiting-no hydronephrosis seen on CT abdomen-UA without proteinuria.  Creatinine markedly improved after IV fluid hydration.  Continue to monitor electrolytes periodically.  SIRS: Noninfectious etiology-likely from swelling/inflammation from extensive LLE DVT.  UA/CXR/blood cultures remain negative.  All antimicrobial therapy discontinued on 9/14.  Leukocytosis is downtrending and stable.  Nausea/vomiting: Resolved-could have had a transient viral syndrome.  CT abdomen negative for any acute abnormalities.  HIDA scan was  negative for cholecystitis.  Tolerating advancement in diet.  Extensive LLE DVT: Continue heparin infusion-vascular surgery planning mechanical thrombectomy early next week.  Suspect likely provoked by recent surgery   Constipation: Significant stool burden seen on CT-given Dulcolax suppository with significant results-continue MiraLAX/senna.  Recent right hip fracture-s/p ORIF on 8/14: Per last orthopedic note on 8/15-nonweightbearing for right lower extremity (but okay for touchdown weightbearing)  Dementia: Pleasantly confused-continue Aricept/Namenda/BuSpar.  Failure to thrive syndrome: Per patient's daughter-ever since she had recent hip fracture-her appetite has been poor-and has been failing to thrive syndrome.  Diet is advanced to regular diet on 9/15-continue to watch closely-if needed we will start Remeron.    Nutrition Status: Nutrition Problem: Moderate Malnutrition Etiology: chronic illness (dementia) Signs/Symptoms: mild fat depletion, moderate fat depletion, mild muscle depletion, moderate muscle depletion, energy intake < 75% for > or equal to 1 month Interventions: Boost Breeze, MVI, Prostat     DVT prophylaxis: Heparin gtt Code Status: Full Family Communication: Daughter-Shea-[phone]-updated over the phone on 9/15  Status is: Inpatient  Remains inpatient appropriate because:Inpatient level of care appropriate due to severity of illness  Dispo: The patient is from: Home              Anticipated d/c is to:  Unclear at this time              Patient currently is not medically stable to d/c.   Difficult to place patient No    Consultants:  Vascular Surgery  Procedures:    Antimicrobials: Anti-infectives (From admission, onward)    Start     Dose/Rate Route Frequency Ordered Stop   04/27/21 0000  ceFEPIme (MAXIPIME) 2 g in sodium chloride 0.9 % 100 mL IVPB  Status:  Discontinued        2 g 200 mL/hr over 30 Minutes Intravenous  Every 24 hours 04/26/21  2128 04/28/21 1626   04/26/21 2200  metroNIDAZOLE (FLAGYL) IVPB 500 mg  Status:  Discontinued        500 mg 100 mL/hr over 60 Minutes Intravenous Every 12 hours 04/26/21 2124 04/28/21 1626   04/26/21 2130  ceFEPIme (MAXIPIME) 2 g in sodium chloride 0.9 % 100 mL IVPB  Status:  Discontinued        2 g 200 mL/hr over 30 Minutes Intravenous  Once 04/26/21 2124 04/28/21 1626       Filed Weights   04/27/21 0800 04/27/21 2146  Weight: 64.4 kg 69.8 kg    Data Reviewed: I have personally reviewed following labs and imaging studies  CBC: Recent Labs  Lab 04/26/21 1849 04/27/21 0717 04/28/21 0035 04/29/21 0048 04/30/21 0127  WBC 31.5* 29.5* 23.7* 14.4* 14.4*  NEUTROABS 28.1* 26.4*  --   --   --   HGB 15.1* 12.3 11.7* 11.8* 10.9*  HCT 45.4 36.7 35.5* 36.0 33.2*  MCV 92.1 93.1 92.9 94.0 93.8  PLT 251 203 206 224 [id number]    Basic Metabolic Panel: Recent Labs  Lab 04/26/21 1849 04/27/21 0717 04/28/21 0035 04/29/21 0048 04/30/21 0127  NA 134* 135 135 138 140  K 5.0 4.3 4.0 3.8 3.8  CL 99 99 101 103 107  CO2 20* '23 22 24 24  '$ GLUCOSE 179* 140* 99 87 102*  BUN 27* 33* 37* 38* 31*  CREATININE 2.88* 2.70* 2.09* 1.41* 1.17*  CALCIUM 10.9* 9.8 9.6 9.5 10.0    GFR: Estimated Creatinine Clearance: 40.7 mL/min (A) (by C-G formula based on SCr of 1.17 mg/dL (H)). Liver Function Tests: Recent Labs  Lab 04/26/21 1849 04/27/21 0717 04/28/21 0035  AST '21 16 16  '$ ALT '22 17 17  '$ ALKPHOS 158* 109 109  BILITOT 1.1 1.1 1.1  PROT 7.5 5.9* 5.5*  ALBUMIN 3.4* 2.7* 2.5*    Recent Labs  Lab 04/26/21 1849  LIPASE 56*    No results for input(s): AMMONIA in the last 168 hours. Coagulation Profile: No results for input(s): INR, PROTIME in the last 168 hours. Cardiac Enzymes: No results for input(s): CKTOTAL, CKMB, CKMBINDEX, TROPONINI in the last 168 hours. BNP (last 3 results) No results for input(s): PROBNP in the last 8760 hours. HbA1C: No results for input(s): HGBA1C in the last 72  hours. CBG: No results for input(s): GLUCAP in the last 168 hours. Lipid Profile: No results for input(s): CHOL, HDL, LDLCALC, TRIG, CHOLHDL, LDLDIRECT in the last 72 hours. Thyroid Function Tests: No results for input(s): TSH, T4TOTAL, FREET4, T3FREE, THYROIDAB in the last 72 hours. Anemia Panel: No results for input(s): VITAMINB12, FOLATE, FERRITIN, TIBC, IRON, RETICCTPCT in the last 72 hours. Sepsis Labs: Recent Labs  Lab 04/26/21 1850 04/26/21 2050  LATICACIDVEN 3.7* 2.4*     Recent Results (from the past 240 hour(s))  Resp Panel by RT-PCR (Flu A&B, Covid) Nasopharyngeal Swab     Status: None   Collection Time: 04/26/21  6:58 PM   Specimen: Nasopharyngeal Swab; Nasopharyngeal(NP) swabs in vial transport medium  Result Value Ref Range Status   SARS Coronavirus 2 by RT PCR NEGATIVE NEGATIVE Final    Comment: (NOTE) SARS-CoV-2 target nucleic acids are NOT DETECTED.  The SARS-CoV-2 RNA is generally detectable in upper respiratory specimens during the acute phase of infection. The lowest concentration of SARS-CoV-2 viral copies this assay can detect is 138 copies/mL. A negative result does not preclude SARS-Cov-2 infection and should not be used as the sole  basis for treatment or other patient management decisions. A negative result may occur with  improper specimen collection/handling, submission of specimen other than nasopharyngeal swab, presence of viral mutation(s) within the areas targeted by this assay, and inadequate number of viral copies(<138 copies/mL). A negative result must be combined with clinical observations, patient history, and epidemiological information. The expected result is Negative.  Fact Sheet for Patients:  EntrepreneurPulse.com.au  Fact Sheet for Healthcare Providers:  IncredibleEmployment.be  This test is no t yet approved or cleared by the [place] FDA and  has been authorized for detection and/or  diagnosis of SARS-CoV-2 by FDA under an Emergency Use Authorization (EUA). This EUA will remain  in effect (meaning this test can be used) for the duration of the COVID-19 declaration under Section 564(b)(1) of the Act, 21 U.S.C.section 360bbb-3(b)(1), unless the authorization is terminated  or revoked sooner.       Influenza A by PCR NEGATIVE NEGATIVE Final   Influenza B by PCR NEGATIVE NEGATIVE Final    Comment: (NOTE) The Xpert Xpress SARS-CoV-2/FLU/RSV plus assay is intended as an aid in the diagnosis of influenza from Nasopharyngeal swab specimens and should not be used as a sole basis for treatment. Nasal washings and aspirates are unacceptable for Xpert Xpress SARS-CoV-2/FLU/RSV testing.  Fact Sheet for Patients: EntrepreneurPulse.com.au  Fact Sheet for Healthcare Providers: IncredibleEmployment.be  This test is not yet approved or cleared by the [place] FDA and has been authorized for detection and/or diagnosis of SARS-CoV-2 by FDA under an Emergency Use Authorization (EUA). This EUA will remain in effect (meaning this test can be used) for the duration of the COVID-19 declaration under Section 564(b)(1) of the Act, 21 U.S.C. section 360bbb-3(b)(1), unless the authorization is terminated or revoked.  Performed at [facility], [place] [address], [place], [place] [address]   Blood culture (routine x 2)     Status: None (Preliminary result)   Collection Time: 04/26/21 10:35 PM   Specimen: BLOOD RIGHT FOREARM  Result Value Ref Range Status   Specimen Description BLOOD RIGHT FOREARM  Final   Special Requests   Final    BOTTLES DRAWN AEROBIC AND ANAEROBIC Blood Culture results may not be optimal due to an inadequate volume of blood received in culture bottles   Culture   Final    NO GROWTH 4 DAYS Performed at [facility], [place] [address] [place], [place] [address]    Report Status PENDING  Incomplete       Radiology Studies: No results found.  Scheduled Meds:  (feeding supplement) PROSource Plus  30 mL Oral BID BM   busPIRone  10 mg Oral BID   diphenhydrAMINE  25 mg Oral Once   donepezil  10 mg Oral QHS   feeding supplement  1 Container Oral TID BM   memantine  10 mg Oral BID   multivitamin with minerals  1 tablet Oral Daily   polyethylene glycol  17 g Oral Daily   senna  1 tablet Oral QHS   Continuous Infusions:  heparin 1,100 Units/hr (04/30/21 1326)   promethazine (PHENERGAN) injection (IM or IVPB) 6.25 mg (04/28/21 0045)     LOS: 4 days   Oren Binet, MD [facility] Pager On Amion  If 7PM-7AM, please contact night-coverage 04/30/2021, 3:18 PM

## 2021-04-30 NOTE — Progress Notes (Signed)
Nutrition Follow-up  DOCUMENTATION CODES:   Non-severe (moderate) malnutrition in context of chronic illness  INTERVENTION:   -Continue Boost Breeze po TID, each supplement provides 250 kcal and 9 grams of protein  -Continue 30 ml Prosource Plus BID, each supplement provides 100 kcals and 15 grams protein -Continue MVI with minerals daily -Feeding assistance with meals  NUTRITION DIAGNOSIS:   Moderate Malnutrition related to chronic illness (dementia) as evidenced by mild fat depletion, moderate fat depletion, mild muscle depletion, moderate muscle depletion, energy intake < 75% for > or equal to 1 month.  Ongoing  GOAL:   Patient will meet greater than or equal to 90% of their needs  Progressing   MONITOR:   PO intake, Supplement acceptance, Diet advancement, Labs, Weight trends, Skin, I & O's  REASON FOR ASSESSMENT:   Malnutrition Screening Tool    ASSESSMENT:   Wendy Watts is a 75 y.o. female with history of advanced dementia who was recently admitted for right hip fracture discharged to rehab on March 30, 2021 and was discharged from rehab on April 21, 2021 back to home was having nausea vomiting for the last 2 days and was not doing well.  Patient's daughter went to check on her today and found that patient in addition to the nausea vomiting was having significant swelling of the left lower extremity and was brought to the ER.  Patient has been largely bedbound with nonweightbearing since her surgery.  9/15- advanced to a regular diet  Reviewed I/O's: -562 ml x 24 hours and +2.6 L since admission  UOP: 1.1 L x 24 hours  Pt unable to provide history secondary to dementia. Per MAR, she is taking Prosource and Boost Breeze supplements. She has stated in the past that she does not like Ensure. Per MD notes, may consider appetite stimulant if poor oral intake persists.   Per [facility] notes, plan for thrombectomy on Tuesday once creatinine is back to baseline.     Medications reviewed and include miralax and senokot.  Labs reviewed.   Diet Order:   Diet Order             Diet regular Room service appropriate? Yes; Fluid consistency: Thin  Diet effective now                   EDUCATION NEEDS:   Education needs have been addressed  Skin:  Skin Assessment: Reviewed RN Assessment  Last BM:  Unknown  Height:   Ht Readings from Last 1 Encounters:  04/27/21 '5\' 5"'$  (1.651 m)    Weight:   Wt Readings from Last 1 Encounters:  04/27/21 69.8 kg    Ideal Body Weight:  56.8 kg  BMI:  Body mass index is 25.61 kg/m.  Estimated Nutritional Needs:   Kcal:  1900-2100  Protein:  90-105 grams  Fluid:  > 1.9 L    [name], RD, LDN, [place] Registered Dietitian II Certified Diabetes Care and Education Specialist Please refer to [facility] for RD and/or RD on-call/weekend/after hours pager

## 2021-04-30 NOTE — Evaluation (Signed)
Clinical/Bedside Swallow Evaluation Patient Details  Name: Wendy Watts MRN: [id number] Date of Birth: Jun 05, 1946  Today's Date: 04/30/2021 Time: SLP Start Time (ACUTE ONLY): 55 SLP Stop Time (ACUTE ONLY): 1651 SLP Time Calculation (min) (ACUTE ONLY): 21 min  Past Medical History:  Past Medical History:  Diagnosis Date   Anxiety    Dementia ([place])    High cholesterol    Vertigo    Past Surgical History:  Past Surgical History:  Procedure Laterality Date   HYSTERECTOMY     INTRAMEDULLARY (IM) NAIL INTERTROCHANTERIC Right 03/28/2021   Procedure: INTRAMEDULLARY (IM) NAIL INTERTROCHANTRIC;  Surgeon: Meredith Pel, MD;  Location: [facility];  Service: Orthopedics;  Laterality: Right;   TOTAL KNEE ARTHROPLASTY Right    HPI:  Pt is a 75 y.o. female who was admitted secondary to nausea, vomiting and left lower extremity swelling. Pt's daughter reported to MD that pt has been having poor appetite and failure to thrive syndrome since recent surgery. CXR 9/12: Very mild left basilar atelectasis. PMH: advanced dementia, recent right hip fracture-s/p ORIF on 8/14    Assessment / Plan / Recommendation  Clinical Impression  Pt was seen for bedside swallow evaluation and she denied a history of dysphagia. However, her reliability as a historian is questioned. Oral mechanism exam was limited due to pt's difficulty following some commands; however, oral motor strength and ROM appeared grossly WFL and dentition was adequate. Pt demonstrated prolonged mastication of regular textures, coughing with thin liquids via straw, and inconsistent throat clearing with thin liquids via cup and with nectar thick liquids. A dysphagia 3 diet with thin liquids is recommended at this time, with observance of swallowing precautions. SLP will follow to assess tolerance and for instrumental assessment if pt's symptoms persist despite precautions. SLP Visit Diagnosis: Dysphagia, unspecified (R13.10)    Aspiration Risk   Mild aspiration risk    Diet Recommendation Dysphagia 3 (Mech soft);Thin liquid   Liquid Administration via: Cup;Straw Medication Administration: Crushed with puree Supervision: Staff to assist with self feeding Compensations: Slow rate;Small sips/bites Postural Changes: Seated upright at 90 degrees;Remain upright for at least 30 minutes after po intake    Other  Recommendations Oral Care Recommendations: Oral care BID    Recommendations for follow up therapy are one component of a multi-disciplinary discharge planning process, led by the attending physician.  Recommendations may be updated based on patient status, additional functional criteria and insurance authorization.  Follow up Recommendations  (TBD)      Frequency and Duration min 2x/week  2 weeks       Prognosis Prognosis for Safe Diet Advancement: Good Barriers to Reach Goals: Cognitive deficits      Swallow Study   General Date of Onset: 04/30/21 HPI: Pt is a 75 y.o. female who was admitted secondary to nausea, vomiting and left lower extremity swelling. Pt's daughter reported to MD that pt has been having poor appetite and failure to thrive syndrome since recent surgery. CXR 9/12: Very mild left basilar atelectasis. PMH: advanced dementia, recent right hip fracture-s/p ORIF on 8/14 Type of Study: Bedside Swallow Evaluation Previous Swallow Assessment: none Diet Prior to this Study: Regular;Thin liquids Temperature Spikes Noted: No Respiratory Status: Room air History of Recent Intubation: No Behavior/Cognition: Alert;Cooperative;Pleasant mood;Lethargic/Drowsy Oral Cavity Assessment: Within Functional Limits Oral Care Completed by SLP: No Oral Cavity - Dentition: Adequate natural dentition Vision: Functional for self-feeding Self-Feeding Abilities: Able to feed self Patient Positioning: Upright in bed;Postural control adequate for testing Baseline Vocal Quality: Low  vocal intensity Volitional Cough:  Weak Volitional Swallow: Able to elicit    Oral/Motor/Sensory Function Overall Oral Motor/Sensory Function: Within functional limits   Ice Chips Ice chips: Within functional limits Presentation: Spoon   Thin Liquid Thin Liquid: Impaired Presentation: Straw;Cup Pharyngeal  Phase Impairments: Cough - Immediate    Nectar Thick Nectar Thick Liquid: Impaired Presentation: Straw;Cup Pharyngeal Phase Impairments: Throat Clearing - Immediate;Throat Clearing - Delayed   Honey Thick Honey Thick Liquid: Not tested   Puree Puree: Within functional limits Presentation: Spoon   Solid     Solid: Impaired Oral Phase Impairments: Impaired mastication Oral Phase Functional Implications: Impaired mastication     Wendy Watts I. Hardin Negus, [place], [place] Office number [phone] Pager [phone]  Horton Marshall 04/30/2021,4:57 PM

## 2021-05-01 DIAGNOSIS — N179 Acute kidney failure, unspecified: Secondary | ICD-10-CM | POA: Diagnosis not present

## 2021-05-01 DIAGNOSIS — R112 Nausea with vomiting, unspecified: Secondary | ICD-10-CM | POA: Diagnosis not present

## 2021-05-01 DIAGNOSIS — E440 Moderate protein-calorie malnutrition: Secondary | ICD-10-CM | POA: Diagnosis not present

## 2021-05-01 DIAGNOSIS — F0390 Unspecified dementia without behavioral disturbance: Secondary | ICD-10-CM | POA: Diagnosis not present

## 2021-05-01 LAB — CBC
HCT: 34 % — ABNORMAL LOW (ref 36.0–46.0)
Hemoglobin: 11 g/dL — ABNORMAL LOW (ref 12.0–15.0)
MCH: 30.5 pg (ref 26.0–34.0)
MCHC: 32.4 g/dL (ref 30.0–36.0)
MCV: 94.2 fL (ref 80.0–100.0)
Platelets: 292 10*3/uL (ref 150–400)
RBC: 3.61 MIL/uL — ABNORMAL LOW (ref 3.87–5.11)
RDW: 13.6 % (ref 11.5–15.5)
WBC: 12.4 10*3/uL — ABNORMAL HIGH (ref 4.0–10.5)
nRBC: 0 % (ref 0.0–0.2)

## 2021-05-01 LAB — BASIC METABOLIC PANEL
Anion gap: 9 (ref 5–15)
BUN: 28 mg/dL — ABNORMAL HIGH (ref 8–23)
CO2: 24 mmol/L (ref 22–32)
Calcium: 10.1 mg/dL (ref 8.9–10.3)
Chloride: 106 mmol/L (ref 98–111)
Creatinine, Ser: 1.05 mg/dL — ABNORMAL HIGH (ref 0.44–1.00)
GFR, Estimated: 55 mL/min — ABNORMAL LOW (ref >60)
Glucose, Bld: 87 mg/dL (ref 70–99)
Potassium: 3.7 mmol/L (ref 3.5–5.1)
Sodium: 139 mmol/L (ref 135–145)

## 2021-05-01 LAB — HEPARIN LEVEL (UNFRACTIONATED)
Heparin Unfractionated: 0.78 IU/mL — ABNORMAL HIGH (ref 0.30–0.70)
Heparin Unfractionated: 0.74 IU/mL — ABNORMAL HIGH (ref 0.30–0.70)
Heparin Unfractionated: 0.66 IU/mL (ref 0.30–0.70)

## 2021-05-01 LAB — CULTURE, BLOOD (ROUTINE X 2): Culture: NO GROWTH

## 2021-05-01 MED ORDER — MIRTAZAPINE 7.5 MG PO TABS
7.5000 mg | ORAL_TABLET | Freq: Every day | ORAL | Status: DC
  Administered 2021-05-01 – 2021-05-11 (×10): 7.5 mg via ORAL
  Filled 2021-05-01 (×12): qty 1

## 2021-05-01 MED ORDER — NYSTATIN 100000 UNIT/ML MT SUSP
5.0000 mL | Freq: Four times a day (QID) | OROMUCOSAL | Status: AC
  Administered 2021-05-01 – 2021-05-08 (×15): 500000 [IU] via ORAL
  Filled 2021-05-01 (×17): qty 5

## 2021-05-01 NOTE — Evaluation (Signed)
Occupational Therapy Evaluation Patient Details Name: Wendy Watts MRN: [id number] DOB: 15-Sep-1945 Today's Date: 05/01/2021   History of Present Illness Pt is a 75 y.o. female admitted from home on 04/26/21 with nausea, vomiting, LLE swelling. CXR with mild L basilar atelectasis. Found to have extensive LLE DVT, AKI, SIRS. Plan for LLE DVT mechanical thrombectomy on 9/20. PMH includes advanced dementia, recent R hip fx s/p ORIF (03/28/21 with d/c to SNF for ~20 days per husband), HOH, vertigo, anxiety.   Clinical Impression   Prior to admission, pt was living at home with her spouse in a 1-level house with 2 STE. Pt was recently discharged home from a SNF 2/2 receiving rehab for R hip ORIF recovery. Daughter reports that pt was performing sliding board transfers and received increased S/A from family with ADLs/ADL mobility using RW/wheelchair. Pt has baseline dementia and per daughter her cognition has declined even more recently.  Today, pt received semi-reclined in bed, daughter present, pt agreeable to OT eval with encouragement. Pt required max assist x2 for bed mobility, min assist x2 for sit>stand using RW (2nd person for safety/line management mostly), min assist x2 for stand pivot transfer from bed>chair using RW (2nd person for sequencing and safety), max-total assist for LB self-care, and min assist-mod I for UB self-care. Pt benefits from increased verbal/visual/tactile cues 2/2 confusion/agitation/decreased safety awareness. Educated Public relations account executive on cognitive re-orientation, RW Oceanographer, d/c planning, and adaptive ADL strategies. See recs below. OT will continue following pt acutely.       Recommendations for follow up therapy are one component of a multi-disciplinary discharge planning process, led by the attending physician.  Recommendations may be updated based on patient status, additional functional criteria and insurance authorization.   Follow Up Recommendations   CIR;Supervision/Assistance - 24 hour (If CIR not available then SNF)    Equipment Recommendations  Other (comment) (defer to next venue of care)    Recommendations for Other Services Speech consult (speech for cognition)     Precautions / Restrictions Precautions Precautions: Fall;Other (comment) (cognitive deficits) Restrictions Weight Bearing Restrictions: Yes RLE Weight Bearing: Partial weight bearing RLE Partial Weight Bearing Percentage or Pounds: R hip ORIF 03/2021, daughter reports pt was TDWB but recently upgraded to 50% PWB      Mobility Bed Mobility Overal bed mobility: Needs Assistance Bed Mobility: Supine to Sit     Supine to sit: Max assist;+2 for physical assistance     General bed mobility comments: MaxA+2 for to sit EOB, suspect more related to pt's cognition and reluctance to mobilize as opposed to actual strength capabilities    Transfers Overall transfer level: Needs assistance Equipment used: Rolling walker (2 wheeled) Transfers: Sit to/from [facility] Sit to Stand: Min assist;+2 safety/equipment Stand pivot transfers: Min assist;+2 safety/equipment       General transfer comment: MinA+2 for trunk elevation and stability standing from EOB to RW; pt requires max, multimodal cues to maintain standing secondary to pain/discomfort; min assist x2 for stand pivot transfer to chair using RW (second person for safety/line management mostly)    Balance Overall balance assessment: Needs assistance   Sitting balance-Leahy Scale: Fair     Standing balance support: Bilateral upper extremity supported;During functional activity Standing balance-Leahy Scale: Poor Standing balance comment: reliant on UE support           ADL either performed or assessed with clinical judgement   ADL Overall ADL's : Needs assistance/impaired Eating/Feeding: Set up;Supervision/ safety Eating/Feeding Details (indicate cue type and  reason): for eating ice  chips sitting in chair and EOB Grooming: Set up;Supervision/safety;Sitting   Upper Body Bathing: Minimal assistance;Sitting   Lower Body Bathing: Maximal assistance;Sitting/lateral leans;Sit to/from stand;Moderate assistance   Upper Body Dressing : Minimal assistance;Sitting   Lower Body Dressing: Moderate assistance;Maximal assistance;Sitting/lateral leans;Sit to/from stand   Toilet Transfer: Minimal assistance;+2 for safety/equipment;Stand-pivot;RW Toilet Transfer Details (indicate cue type and reason): stand pivot transfer from bed>chair with min assist x2 (second person for line management and safety 2/2 poor cognition) Toileting- Clothing Manipulation and Hygiene: Total assistance;Bed level Toileting - Clothing Manipulation Details (indicate cue type and reason): currently with purewick Tub/ Shower Transfer:  (NA)   Functional mobility during ADLs: Minimal assistance;+2 for safety/equipment;Rolling walker General ADL Comments: mostly +2 for safety and sequencing/lines     Vision Baseline Vision/History: 0 No visual deficits Ability to See in Adequate Light: 0 Adequate Patient Visual Report: No change from baseline Vision Assessment?: No apparent visual deficits     Perception Perception Perception Tested?: No   Praxis Praxis Praxis tested?: Not tested    Pertinent Vitals/Pain Pain Assessment: Faces Faces Pain Scale: Hurts little more Pain Location: BLEs (pt not specifying), L calf to palpation Pain Descriptors / Indicators: Discomfort;Moaning;Grimacing Pain Intervention(s): Limited activity within patient's tolerance;Monitored during session;Repositioned;Relaxation     Hand Dominance Right   Extremity/Trunk Assessment Upper Extremity Assessment Upper Extremity Assessment: Overall WFL for tasks assessed   Lower Extremity Assessment Lower Extremity Assessment: Defer to PT evaluation RLE Deficits / Details: Functionally at least 3/5 strength observed LLE Deficits /  Details: Functionally at least 3/5 strength observed; noted lower leg pitting edema, painful to palpation   Cervical / Trunk Assessment Cervical / Trunk Assessment: Normal   Communication Communication Communication: HOH   Cognition Arousal/Alertness: Awake/alert Behavior During Therapy: Restless (perseverated on needing ice and wanting to return to bed) Overall Cognitive Status: History of cognitive impairments - at baseline       General Comments: Pt with h/o dementia, daughter notes significant decline since fall 03/2021 that lead to R hip ORIF and SNF admission. Pt oriented to self and daughter only; hyperfocused on wanting ice chips, repeating, "I just want ice... I need to go back to bed" throughout session. Pt following simple commands inconsistently, hesitant to mobilize   General Comments  Daughter [name]) present and supportive. Increased time discussing pt's current functional status and d/c needs - family hopeful for additional time at SNF for rehab before return home; family plans to provide assist at home as needed. Daughter reports they have considered ALF but pt/husband cannot afford. Edema to LLE especially foot.            Home Living Family/patient expects to be discharged to:: Private residence Living Arrangements: Spouse/significant other;Other (Comment) (daughter stops by) Available Help at Discharge: Family;Available 24 hours/day (spouse + daughter) Type of Home: House Home Access: Stairs to enter [employer] of Steps: 2 STE Entrance Stairs-Rails: None Home Layout: One level     Bathroom Shower/Tub: Teacher, early years/pre: Standard Bathroom Accessibility: Yes How Accessible: Accessible via walker Home Equipment: [place] - 2 wheels;Shower seat;Bedside commode;Wheelchair - Education administrator (comment) (sliding board)   Additional Comments: spouse lives with pt, daughter works from home and has flexibility to provide S/A too      Prior  Functioning/Environment Level of Independence: Needs assistance  Gait / Transfers Assistance Needed: Pt recently d/c home from SNF on 04/21/21, was performing slide board transfers to/from wheelchair/bed/BSC with assist from family; apparently  stood a couple times with walker and assist from husband ADL's / Homemaking Assistance Needed: Wearing diapers, assist for ADLs; poor oral intake       OT Problem List: Decreased strength;Decreased activity tolerance;Impaired balance (sitting and/or standing);Decreased cognition;Decreased safety awareness;Decreased knowledge of use of DME or AE;Decreased knowledge of precautions;Pain;Increased edema      OT Treatment/Interventions: Self-care/ADL training;Therapeutic exercise;Neuromuscular education;Energy conservation;DME and/or AE instruction;Therapeutic activities;Cognitive remediation/compensation;Patient/family education    OT Goals(Current goals can be found in the care plan section) Acute Rehab OT Goals Patient Stated Goal: Hopeful for more rehab before return home OT Goal Formulation: With patient/family Time For Goal Achievement: 05/15/21 Potential to Achieve Goals: Fair  OT Frequency: Min 2X/week       Co-evaluation PT/OT/SLP Co-Evaluation/Treatment: Yes Reason for Co-Treatment: Complexity of the patient's impairments (multi-system involvement);For patient/therapist safety;Necessary to address cognition/behavior during functional activity;To address functional/ADL transfers PT goals addressed during session: Mobility/safety with mobility;Balance;Proper use of DME OT goals addressed during session: ADL's and self-care;Proper use of Adaptive equipment and DME;Other (comment) (cognition)      AM-PAC OT "6 Clicks" Daily Activity     Outcome Measure Help from another person eating meals?: A Little Help from another person taking care of personal grooming?: A Little Help from another person toileting, which includes using toliet, bedpan, or  urinal?: Total Help from another person bathing (including washing, rinsing, drying)?: A Lot Help from another person to put on and taking off regular upper body clothing?: A Little Help from another person to put on and taking off regular lower body clothing?: Total 6 Click Score: 13   End of Session Equipment Utilized During Treatment: Gait belt;Rolling walker Nurse Communication: Mobility status  Activity Tolerance: Patient limited by fatigue;Patient limited by pain;Other (comment) (patient limited by discomfort and agitation) Patient left: in chair;with call bell/phone within reach;with chair alarm set;with family/visitor present  OT Visit Diagnosis: Unsteadiness on feet (R26.81);Muscle weakness (generalized) (M62.81);Pain (cognitive deficits) Pain - Right/Left: Left Pain - part of body: Leg                Time: [id number] OT Time Calculation (min): 38 min Charges:  OT General Charges $OT Visit: 1 Visit OT Evaluation $OT Eval Moderate Complexity: 1 Mod  Michel Bickers, OTR/L [facility] [phone]  Francesca Jewett 05/01/2021, 5:27 PM

## 2021-05-01 NOTE — Progress Notes (Signed)
ANTICOAGULATION CONSULT NOTE - Follow Up Consult  Pharmacy Consult for heparin Indication:  VTE treatment  Allergies  Allergen Reactions   Allegra [Fexofenadine Hcl] Other (See Comments)    "takes me to another world"   Ativan [Lorazepam] Other (See Comments)    Hallucinations   Azithromycin Nausea And Vomiting   Codeine Hives   Doxycycline Hyclate Nausea Only   Penicillins Hives    Has patient had a PCN reaction causing immediate rash, facial/tongue/throat swelling, SOB or lightheadedness with hypotension: no Has patient had a PCN reaction causing severe rash involving mucus membranes or skin necrosis: no Has patient had a PCN reaction that required hospitalization: no Has patient had a PCN reaction occurring within the last 10 years: no If all of the above answers are "NO", then may proceed with Cephalosporin use.    Trazodone And Nefazodone Nausea Only   Zyrtec [Cetirizine] Other (See Comments)    "takes me to another world"   Patient Measurements: Heparin Dosing Weight: 65 kg   Vital Signs: Temp: 98.3 F (36.8 C) (09/17 0000) Temp Source: Oral (09/17 0000) BP: 107/58 (09/17 0000) Pulse Rate: 65 (09/17 0000)  Labs: Recent Labs    04/29/21 0048 04/30/21 0127 05/01/21 0038  HGB 11.8* 10.9* 11.0*  HCT 36.0 33.2* 34.0*  PLT 224 265 292  HEPARINUNFRC 0.57 0.53 0.78*  CREATININE 1.41* 1.17*  --     Estimated Creatinine Clearance: 40.7 mL/min (A) (by C-G formula based on SCr of 1.17 mg/dL (H)).  Medical History: Past Medical History:  Diagnosis Date   Anxiety    Dementia ([place])    High cholesterol    Vertigo     Assessment: 41 YOF who recently had a R hip fracture s/p surgery here with acute DVT to start IV heparin for VTE treatment (acute L CIV thrombus on ultrasound). Plan for mechanical thrombectomy on 9/19.  Heparin level now up to supratherapeutic (0.78) on gtt at 1100 units/hr. No bleeding noted.  Goal of Therapy:  Heparin level 0.3-0.7  units/ml Monitor platelets by anticoagulation protocol: Yes   Plan:  Decrease heparin infusion to 1000 units/hr  F/u 8 hr heparin level  Sherlon Handing, PharmD, BCPS Please see amion for complete clinical pharmacist phone list 05/01/2021 2:04 AM

## 2021-05-01 NOTE — Progress Notes (Signed)
ANTICOAGULATION CONSULT NOTE - Follow Up Consult  Pharmacy Consult for heparin Indication:  VTE treatment  Allergies  Allergen Reactions   Allegra [Fexofenadine Hcl] Other (See Comments)    "takes me to another world"   Ativan [Lorazepam] Other (See Comments)    Hallucinations   Azithromycin Nausea And Vomiting   Codeine Hives   Doxycycline Hyclate Nausea Only   Penicillins Hives    Has patient had a PCN reaction causing immediate rash, facial/tongue/throat swelling, SOB or lightheadedness with hypotension: no Has patient had a PCN reaction causing severe rash involving mucus membranes or skin necrosis: no Has patient had a PCN reaction that required hospitalization: no Has patient had a PCN reaction occurring within the last 10 years: no If all of the above answers are "NO", then may proceed with Cephalosporin use.    Trazodone And Nefazodone Nausea Only   Zyrtec [Cetirizine] Other (See Comments)    "takes me to another world"   Patient Measurements: Heparin Dosing Weight: 65 kg   Vital Signs: Temp: 98.8 F (37.1 C) (09/17 1300) Temp Source: Oral (09/17 1300) BP: 128/71 (09/17 1710) Pulse Rate: 85 (09/17 1710)  Labs: Recent Labs    04/29/21 0048 04/30/21 0127 05/01/21 0038 05/01/21 0943 05/01/21 1826  HGB 11.8* 10.9* 11.0*  --   --   HCT 36.0 33.2* 34.0*  --   --   PLT 224 265 292  --   --   HEPARINUNFRC 0.57 0.53 0.78* 0.74* 0.66  CREATININE 1.41* 1.17* 1.05*  --   --     Estimated Creatinine Clearance: 41.7 mL/min (A) (by C-G formula based on SCr of 1.05 mg/dL (H)).  Medical History: Past Medical History:  Diagnosis Date   Anxiety    Dementia ([place])    High cholesterol    Vertigo     Assessment: [name] who recently had a R hip fracture s/p surgery here with acute DVT to start IV heparin for VTE treatment (acute L CIV thrombus on ultrasound). Plan for mechanical thrombectomy on 9/19.  Heparin level therapeutic at 0.66, Hgb stable, plt stable, no s/s  of bleeding noted by nurse.  Goal of Therapy:  Heparin level 0.3-0.7 units/ml Monitor platelets by anticoagulation protocol: Yes   Plan:  Continue heparin infusion at 900 units/hr  F/u 8 hr heparin level Monitor for s/s of bleeding.  Thank you for allowing pharmacy to participate in this patient's care.  Albertina Parr, PharmD., BCPS, BCCCP Clinical Pharmacist Please refer to [facility] for unit-specific pharmacist

## 2021-05-01 NOTE — Evaluation (Signed)
Physical Therapy Evaluation Patient Details Name: Wendy Watts MRN: [id number] DOB: 1946-07-04 Today's Date: 05/01/2021  History of Present Illness  Pt is a 75 y.o. female admitted from home on 04/26/21 with nausea, vomiting, LLE swelling. CXR with mild L basilar atelectasis. Found to have extensive LLE DVT, AKI, SIRS. Plan for LLE DVT mechanical thrombectomy on 9/20. PMH includes advanced dementia, recent R hip fx s/p ORIF (03/28/21 with d/c to SNF for ~20 days per husband), HOH, vertigo, anxiety.   Clinical Impression  Pt presents with an overall decrease in functional mobility secondary to above. PTA, pt recently d/c home last week from SNF after R hip ORIF; daughter reports that pt able to perform slideboard transfers to w/c and standing with RW, assist from family. Today, pt able to stand with RW, requiring min-maxA+2 for mobility. Pt limited by pain, generalized weakness, poor balance strategies, and impaired cognition. Feel pt would benefit from intensive CIR-level therapies to maximize functional mobility and decrease caregiver burden prior to return home. If CIR not an option, pt will require post-acute rehab at [facility].    Recommendations for follow up therapy are one component of a multi-disciplinary discharge planning process, led by the attending physician.  Recommendations may be updated based on patient status, additional functional criteria and insurance authorization.  Follow Up Recommendations CIR;Supervision/Assistance - 24 hour    Equipment Recommendations  None recommended by PT    Recommendations for Other Services       Precautions / Restrictions Precautions Precautions: Fall Restrictions Weight Bearing Restrictions: Yes RLE Weight Bearing: Partial weight bearing RLE Partial Weight Bearing Percentage or Pounds: R hip ORIF 03/2021 - daughter reports was TDWB but recently upgraded to 50% PWB      Mobility  Bed Mobility Overal bed mobility: Needs Assistance Bed Mobility:  Supine to Sit     Supine to sit: Max assist;+2 for physical assistance     General bed mobility comments: MaxA+2 for to sit EOB, suspect more related to pt's cognition and reluctance to mobilize as opposed to actual strength capabilities    Transfers Overall transfer level: Needs assistance Equipment used: Rolling walker (2 wheeled) Transfers: Sit to/from Stand Sit to Stand: Min assist;+2 physical assistance;+2 safety/equipment         General transfer comment: MinA+2 for trunk elevation and stability standing from EOB to RW; pt requires max, multimodal cues to maintain standing secondary to pain/discomfort  Ambulation/Gait Ambulation/Gait assistance: Min assist;Mod assist;Max assist;+2 safety/equipment Gait Distance (Feet): 1 Feet Assistive device: Rolling walker (2 wheeled) Gait Pattern/deviations: Step-to pattern;Trunk flexed;Leaning posteriorly;Antalgic Gait velocity: Decreased   General Gait Details: Slow, unsteady steps from bed to recliner with RW and min-maxA+2; pt with inconsistent movement initiation, at times maintaining standing with min guard, then requiring maxA to prevent premature sitting  Stairs            Wheelchair Mobility    Modified Rankin (Stroke Patients Only)       Balance Overall balance assessment: Needs assistance   Sitting balance-Leahy Scale: Fair     Standing balance support: Bilateral upper extremity supported;Single extremity supported;During functional activity Standing balance-Leahy Scale: Poor Standing balance comment: reliant on UE support                             Pertinent Vitals/Pain Pain Assessment: Faces Faces Pain Scale: Hurts little more Pain Location: BLEs (pt not specifying), L calf to palpation Pain Descriptors / Indicators: Discomfort;Moaning;Grimacing Pain  Intervention(s): Monitored during session;Limited activity within patient's tolerance;Repositioned    Home Living Family/patient expects to  be discharged to:: Private residence Living Arrangements: Spouse/significant other (daughter stops by) Available Help at Discharge: Family;Available 24 hours/day (spouse + daughter) Type of Home: House Home Access: Stairs to enter Entrance Stairs-Rails: None Entrance Stairs-Number of Steps: 2 STE Home Layout: One level Home Equipment: Walker - 2 wheels;Shower seat;Bedside commode;Wheelchair - Education administrator (comment) (sliding board) Additional Comments: spouse lives with pt, daughter works from home and has flexibility to provide S/A too    Prior Function Level of Independence: Needs assistance   Gait / Transfers Assistance Needed: Pt recently d/c home from SNF on 04/21/21, was performing slide board transfers to/from wheelchair/bed/BSC with assist from family; apparently stood a couple times with walker and assist from husband  ADL's / Homemaking Assistance Needed: Wearing diapers, assist for ADLs; poor oral intake        Hand Dominance        Extremity/Trunk Assessment   Upper Extremity Assessment Upper Extremity Assessment: Generalized weakness;Difficult to assess due to impaired cognition    Lower Extremity Assessment Lower Extremity Assessment: Generalized weakness;RLE deficits/detail;LLE deficits/detail;Difficult to assess due to impaired cognition RLE Deficits / Details: Functionally at least 3/5 strength observed LLE Deficits / Details: Functionally at least 3/5 strength observed; noted lower leg pitting edema, painful to palpation       Communication   Communication: HOH  Cognition Arousal/Alertness: Awake/alert Behavior During Therapy: WFL for tasks assessed/performed Overall Cognitive Status: History of cognitive impairments - at baseline                                 General Comments: Pt with h/o dementia, daughter notes significant decline since fall 03/2021 that lead to R hip ORIF and SNF admission. Pt oriented to self and daughter only;  hyperfocused on wanting ice chips, repeating, "I just want ice... I need to go back to bed" throughout session. Pt following simple commands inconsistently, hesitant to mobilize      General Comments General comments (skin integrity, edema, etc.): Daughter [name]) present and supportive. Increased time discussing pt's current functional status and d/c needs - family hopeful for additional time at SNF for rehab before return home; family plans to provide assist at home as needed. Daughter reports they have considered ALF but pt/husband cannot afford    Exercises     Assessment/Plan    PT Assessment Patient needs continued PT services  PT Problem List Decreased strength;Decreased range of motion;Decreased activity tolerance;Decreased balance;Decreased mobility;Decreased cognition;Decreased knowledge of use of DME;Decreased safety awareness;Decreased knowledge of precautions;Pain       PT Treatment Interventions DME instruction;Gait training;Functional mobility training;Therapeutic activities;Therapeutic exercise;Balance training;Stair training;Patient/family education;Cognitive remediation;Wheelchair mobility training    PT Goals (Current goals can be found in the Care Plan section)  Acute Rehab PT Goals Patient Stated Goal: Hopeful for more rehab before return home PT Goal Formulation: With family Time For Goal Achievement: 05/15/21 Potential to Achieve Goals: Fair    Frequency Min 3X/week   Barriers to discharge        Co-evaluation PT/OT/SLP Co-Evaluation/Treatment: Yes Reason for Co-Treatment: Complexity of the patient's impairments (multi-system involvement);Necessary to address cognition/behavior during functional activity;For patient/therapist safety;To address functional/ADL transfers PT goals addressed during session: Mobility/safety with mobility;Balance;Proper use of DME OT goals addressed during session: ADL's and self-care;Proper use of Adaptive equipment and DME        AM-PAC  PT "6 Clicks" Mobility  Outcome Measure Help needed turning from your back to your side while in a flat bed without using bedrails?: Total Help needed moving from lying on your back to sitting on the side of a flat bed without using bedrails?: Total Help needed moving to and from a bed to a chair (including a wheelchair)?: A Lot Help needed standing up from a chair using your arms (e.g., wheelchair or bedside chair)?: A Lot Help needed to walk in hospital room?: A Lot Help needed climbing 3-5 steps with a railing? : Total 6 Click Score: 9    End of Session Equipment Utilized During Treatment: Gait belt Activity Tolerance: Patient tolerated treatment well;Other (comment) (limited by cognition) Patient left: in chair;with call bell/phone within reach;with chair alarm set;with family/visitor present Nurse Communication: Mobility status PT Visit Diagnosis: Other abnormalities of gait and mobility (R26.89);Muscle weakness (generalized) (M62.81);Pain    Time: 1520-1555 PT Time Calculation (min) (ACUTE ONLY): 35 min   Charges:   PT Evaluation $PT Eval Moderate Complexity: [place], PT, DPT Acute Rehabilitation Services  Pager [phone] Office [place] 05/01/2021, 4:55 PM

## 2021-05-01 NOTE — Progress Notes (Signed)
PROGRESS NOTE    Wendy Watts  [id number] DOB: [birth year] DOA: 04/26/2021 PCP: [name], MD    Brief Narrative:  75 y.o. female with history of advanced dementia who was recently admitted for right hip fracture discharged to rehab on March 30, 2021-brought to the ED on 9/12 for significant LLE swelling along with nausea/vomiting-patient was found to have extensive LLE DVT and AKI.  She was subsequently admitted to the hospitalist service-see below for further details.  Subjective:  Remains pleasantly confused-follows simple commands.  Objective: Vitals: Blood pressure (!) 115/58, pulse 75, temperature 98.8 F (37.1 C), temperature source Oral, resp. rate 18, height '5\' 5"'$  (1.651 m), weight 65.1 kg, SpO2 98 %.   Exam: Gen Exam:Alert awake-not in any distress HEENT:atraumatic, normocephalic Chest: B/L clear to auscultation anteriorly CVS:S1S2 regular Abdomen:soft non tender, non distended Extremities: LLE edema continues Neurology: Non focal Skin: no rash   Pertinent labs/radiology: WBC: 12.4 Hb: 11.0 Creatinine: 1.05  Doppler on 9/13: DVT involving left CFV, SF junction, left femoral vein, left proximal profunda vein, left popliteal vein, left posterior tibial vein, left peroneal vein, left gastrocnemius vein  Assessment & Plan: AKI: Hemodynamically mediated in the setting of nausea/vomiting-no hydronephrosis seen on CT abdomen-UA without proteinuria.  Creatinine markedly improved after IV fluid hydration.  Continue to monitor electrolytes periodically.  SIRS: Noninfectious etiology-likely from swelling/inflammation from extensive LLE DVT.  UA/CXR/blood cultures remain negative.  All antimicrobial therapy discontinued on 9/14.  Leukocytosis is downtrending and stable.  Nausea/vomiting: Resolved-could have had a transient viral syndrome.  CT abdomen negative for any acute abnormalities.  HIDA scan was negative for cholecystitis.  Tolerating advancement in  diet.  Extensive LLE DVT: Continue heparin infusion-vascular surgery planning mechanical thrombectomy early next week.  Suspect likely provoked by recent surgery   Constipation: Significant stool burden seen on CT-given Dulcolax suppository with significant results-continue MiraLAX/senna.  Recent right hip fracture-s/p ORIF on 8/14: Per last orthopedic note on 8/15-nonweightbearing for right lower extremity (but okay for touchdown weightbearing)  Dementia: Pleasantly confused-continue Aricept/Namenda/BuSpar.  Oral thrush: Start nystatin x7 days.  Failure to thrive syndrome: Per patient's daughter-ever since she had recent hip fracture-her appetite has been poor-and has been failing to thrive syndrome.  Per nursing staff if he continues to have poor oral intake-start Remeron.    Nutrition Status: Nutrition Problem: Moderate Malnutrition Etiology: chronic illness (dementia) Signs/Symptoms: mild fat depletion, moderate fat depletion, mild muscle depletion, moderate muscle depletion, energy intake < 75% for > or equal to 1 month Interventions: Boost Breeze, MVI, Prostat     DVT prophylaxis: Heparin gtt Code Status: Full Family Communication: Daughter-Shea-[phone]-updated over the phone on 9/17  Status is: Inpatient  Remains inpatient appropriate because:Inpatient level of care appropriate due to severity of illness  Dispo: The patient is from: Home              Anticipated d/c is to:  Unclear at this time              Patient currently is not medically stable to d/c.   Difficult to place patient No    Consultants:  Vascular Surgery  Procedures:    Antimicrobials: Anti-infectives (From admission, onward)    Start     Dose/Rate Route Frequency Ordered Stop   04/27/21 0000  ceFEPIme (MAXIPIME) 2 g in sodium chloride 0.9 % 100 mL IVPB  Status:  Discontinued        2 g 200 mL/hr over 30 Minutes Intravenous Every 24 hours 04/26/21  2128 04/28/21 1626   04/26/21 2200   metroNIDAZOLE (FLAGYL) IVPB 500 mg  Status:  Discontinued        500 mg 100 mL/hr over 60 Minutes Intravenous Every 12 hours 04/26/21 2124 04/28/21 1626   04/26/21 2130  ceFEPIme (MAXIPIME) 2 g in sodium chloride 0.9 % 100 mL IVPB  Status:  Discontinued        2 g 200 mL/hr over 30 Minutes Intravenous  Once 04/26/21 2124 04/28/21 1626       Filed Weights   04/27/21 0800 04/27/21 2146 05/01/21 0400  Weight: 64.4 kg 69.8 kg 65.1 kg    Data Reviewed: I have personally reviewed following labs and imaging studies  CBC: Recent Labs  Lab 04/26/21 1849 04/27/21 0717 04/28/21 0035 04/29/21 0048 04/30/21 0127 05/01/21 0038  WBC 31.5* 29.5* 23.7* 14.4* 14.4* 12.4*  NEUTROABS 28.1* 26.4*  --   --   --   --   HGB 15.1* 12.3 11.7* 11.8* 10.9* 11.0*  HCT 45.4 36.7 35.5* 36.0 33.2* 34.0*  MCV 92.1 93.1 92.9 94.0 93.8 94.2  PLT 251 203 206 224 [id number]    Basic Metabolic Panel: Recent Labs  Lab 04/27/21 0717 04/28/21 0035 04/29/21 0048 04/30/21 0127 05/01/21 0038  NA 135 135 138 140 139  K 4.3 4.0 3.8 3.8 3.7  CL 99 101 103 107 106  CO2 '23 22 24 24 24  '$ GLUCOSE 140* 99 87 102* 87  BUN 33* 37* 38* 31* 28*  CREATININE 2.70* 2.09* 1.41* 1.17* 1.05*  CALCIUM 9.8 9.6 9.5 10.0 10.1    GFR: Estimated Creatinine Clearance: 41.7 mL/min (A) (by C-G formula based on SCr of 1.05 mg/dL (H)). Liver Function Tests: Recent Labs  Lab 04/26/21 1849 04/27/21 0717 04/28/21 0035  AST '21 16 16  '$ ALT '22 17 17  '$ ALKPHOS 158* 109 109  BILITOT 1.1 1.1 1.1  PROT 7.5 5.9* 5.5*  ALBUMIN 3.4* 2.7* 2.5*    Recent Labs  Lab 04/26/21 1849  LIPASE 56*    No results for input(s): AMMONIA in the last 168 hours. Coagulation Profile: No results for input(s): INR, PROTIME in the last 168 hours. Cardiac Enzymes: No results for input(s): CKTOTAL, CKMB, CKMBINDEX, TROPONINI in the last 168 hours. BNP (last 3 results) No results for input(s): PROBNP in the last 8760 hours. HbA1C: No results for  input(s): HGBA1C in the last 72 hours. CBG: No results for input(s): GLUCAP in the last 168 hours. Lipid Profile: No results for input(s): CHOL, HDL, LDLCALC, TRIG, CHOLHDL, LDLDIRECT in the last 72 hours. Thyroid Function Tests: No results for input(s): TSH, T4TOTAL, FREET4, T3FREE, THYROIDAB in the last 72 hours. Anemia Panel: No results for input(s): VITAMINB12, FOLATE, FERRITIN, TIBC, IRON, RETICCTPCT in the last 72 hours. Sepsis Labs: Recent Labs  Lab 04/26/21 1850 04/26/21 2050  LATICACIDVEN 3.7* 2.4*     Recent Results (from the past 240 hour(s))  Resp Panel by RT-PCR (Flu A&B, Covid) Nasopharyngeal Swab     Status: None   Collection Time: 04/26/21  6:58 PM   Specimen: Nasopharyngeal Swab; Nasopharyngeal(NP) swabs in vial transport medium  Result Value Ref Range Status   SARS Coronavirus 2 by RT PCR NEGATIVE NEGATIVE Final    Comment: (NOTE) SARS-CoV-2 target nucleic acids are NOT DETECTED.  The SARS-CoV-2 RNA is generally detectable in upper respiratory specimens during the acute phase of infection. The lowest concentration of SARS-CoV-2 viral copies this assay can detect is 138 copies/mL. A negative result does not preclude  SARS-Cov-2 infection and should not be used as the sole basis for treatment or other patient management decisions. A negative result may occur with  improper specimen collection/handling, submission of specimen other than nasopharyngeal swab, presence of viral mutation(s) within the areas targeted by this assay, and inadequate number of viral copies(<138 copies/mL). A negative result must be combined with clinical observations, patient history, and epidemiological information. The expected result is Negative.  Fact Sheet for Patients:  EntrepreneurPulse.com.au  Fact Sheet for Healthcare Providers:  IncredibleEmployment.be  This test is no t yet approved or cleared by the [place] FDA and  has been  authorized for detection and/or diagnosis of SARS-CoV-2 by FDA under an Emergency Use Authorization (EUA). This EUA will remain  in effect (meaning this test can be used) for the duration of the COVID-19 declaration under Section 564(b)(1) of the Act, 21 U.S.C.section 360bbb-3(b)(1), unless the authorization is terminated  or revoked sooner.       Influenza A by PCR NEGATIVE NEGATIVE Final   Influenza B by PCR NEGATIVE NEGATIVE Final    Comment: (NOTE) The Xpert Xpress SARS-CoV-2/FLU/RSV plus assay is intended as an aid in the diagnosis of influenza from Nasopharyngeal swab specimens and should not be used as a sole basis for treatment. Nasal washings and aspirates are unacceptable for Xpert Xpress SARS-CoV-2/FLU/RSV testing.  Fact Sheet for Patients: EntrepreneurPulse.com.au  Fact Sheet for Healthcare Providers: IncredibleEmployment.be  This test is not yet approved or cleared by the [place] FDA and has been authorized for detection and/or diagnosis of SARS-CoV-2 by FDA under an Emergency Use Authorization (EUA). This EUA will remain in effect (meaning this test can be used) for the duration of the COVID-19 declaration under Section 564(b)(1) of the Act, 21 U.S.C. section 360bbb-3(b)(1), unless the authorization is terminated or revoked.  Performed at [facility], [place] [address], [place], [place] [address]   Blood culture (routine x 2)     Status: None   Collection Time: 04/26/21 10:35 PM   Specimen: BLOOD RIGHT FOREARM  Result Value Ref Range Status   Specimen Description BLOOD RIGHT FOREARM  Final   Special Requests   Final    BOTTLES DRAWN AEROBIC AND ANAEROBIC Blood Culture results may not be optimal due to an inadequate volume of blood received in culture bottles   Culture   Final    NO GROWTH 5 DAYS Performed at [facility], [place] [address], [place], [place] [address]    Report Status 05/01/2021 FINAL  Final       Radiology Studies: No results found.  Scheduled Meds:  (feeding supplement) PROSource Plus  30 mL Oral BID BM   busPIRone  10 mg Oral BID   diphenhydrAMINE  25 mg Oral Once   donepezil  10 mg Oral QHS   feeding supplement  1 Container Oral TID BM   memantine  10 mg Oral BID   multivitamin with minerals  1 tablet Oral Daily   polyethylene glycol  17 g Oral Daily   senna  1 tablet Oral QHS   Continuous Infusions:  heparin 900 Units/hr (05/01/21 1143)   promethazine (PHENERGAN) injection (IM or IVPB) 6.25 mg (04/28/21 0045)     LOS: 5 days   Oren Binet, MD [facility] Pager [place]  If 7PM-7AM, please contact night-coverage 05/01/2021, 3:23 PM

## 2021-05-01 NOTE — Progress Notes (Addendum)
ANTICOAGULATION CONSULT NOTE - Follow Up Consult  Pharmacy Consult for heparin Indication:  VTE treatment  Allergies  Allergen Reactions   Allegra [Fexofenadine Hcl] Other (See Comments)    "takes me to another world"   Ativan [Lorazepam] Other (See Comments)    Hallucinations   Azithromycin Nausea And Vomiting   Codeine Hives   Doxycycline Hyclate Nausea Only   Penicillins Hives    Has patient had a PCN reaction causing immediate rash, facial/tongue/throat swelling, SOB or lightheadedness with hypotension: no Has patient had a PCN reaction causing severe rash involving mucus membranes or skin necrosis: no Has patient had a PCN reaction that required hospitalization: no Has patient had a PCN reaction occurring within the last 10 years: no If all of the above answers are "NO", then may proceed with Cephalosporin use.    Trazodone And Nefazodone Nausea Only   Zyrtec [Cetirizine] Other (See Comments)    "takes me to another world"   Patient Measurements: Heparin Dosing Weight: 65 kg   Vital Signs: Temp: 98.2 F (36.8 C) (09/17 0700) Temp Source: Oral (09/17 0700) BP: 105/68 (09/17 0700) Pulse Rate: 66 (09/17 0700)  Labs: Recent Labs    04/29/21 0048 04/30/21 0127 05/01/21 0038 05/01/21 0943  HGB 11.8* 10.9* 11.0*  --   HCT 36.0 33.2* 34.0*  --   PLT 224 265 292  --   HEPARINUNFRC 0.57 0.53 0.78* 0.74*  CREATININE 1.41* 1.17* 1.05*  --     Estimated Creatinine Clearance: 41.7 mL/min (A) (by C-G formula based on SCr of 1.05 mg/dL (H)).  Medical History: Past Medical History:  Diagnosis Date   Anxiety    Dementia ([place])    High cholesterol    Vertigo     Assessment: 62 YOF who recently had a R hip fracture s/p surgery here with acute DVT to start IV heparin for VTE treatment (acute L CIV thrombus on ultrasound). Plan for mechanical thrombectomy on 9/19.  Heparin level 0.74 units/mL, sub-therapeutic  Hgb stable, plt stable, no s/s of bleeding noted by  nurse.  Goal of Therapy:  Heparin level 0.3-0.7 units/ml Monitor platelets by anticoagulation protocol: Yes   Plan:  Decrease heparin infusion to 900 units/hr  F/u 8 hr heparin level Monitor for s/s of bleeding.  Thank you for allowing pharmacy to participate in this patient's care.  Levonne Spiller, PharmD PGY1 Acute Care Resident  05/01/2021,11:36 AM

## 2021-05-02 DIAGNOSIS — R112 Nausea with vomiting, unspecified: Secondary | ICD-10-CM | POA: Diagnosis not present

## 2021-05-02 DIAGNOSIS — F0390 Unspecified dementia without behavioral disturbance: Secondary | ICD-10-CM | POA: Diagnosis not present

## 2021-05-02 DIAGNOSIS — E440 Moderate protein-calorie malnutrition: Secondary | ICD-10-CM | POA: Diagnosis not present

## 2021-05-02 DIAGNOSIS — N179 Acute kidney failure, unspecified: Secondary | ICD-10-CM | POA: Diagnosis not present

## 2021-05-02 LAB — IRON AND TIBC
Iron: 49 ug/dL (ref 28–170)
Saturation Ratios: 33 % — ABNORMAL HIGH (ref 10.4–31.8)
TIBC: 150 ug/dL — ABNORMAL LOW (ref 250–450)
UIBC: 101 ug/dL

## 2021-05-02 LAB — HEPARIN LEVEL (UNFRACTIONATED): Heparin Unfractionated: 0.59 IU/mL (ref 0.30–0.70)

## 2021-05-02 LAB — RETICULOCYTES
Immature Retic Fract: 8.7 % (ref 2.3–15.9)
RBC.: 3.73 MIL/uL — ABNORMAL LOW (ref 3.87–5.11)
Retic Count, Absolute: 57.1 10*3/uL (ref 19.0–186.0)
Retic Ct Pct: 1.5 % (ref 0.4–3.1)

## 2021-05-02 LAB — VITAMIN B12: Vitamin B-12: 186 pg/mL (ref 180–914)

## 2021-05-02 LAB — FERRITIN: Ferritin: 446 ng/mL — ABNORMAL HIGH (ref 11–307)

## 2021-05-02 LAB — FOLATE: Folate: 10 ng/mL (ref >5.9)

## 2021-05-02 MED ORDER — SENNA 8.6 MG PO TABS
2.0000 | ORAL_TABLET | Freq: Every day | ORAL | Status: DC
  Administered 2021-05-02 – 2021-05-11 (×8): 17.2 mg via ORAL
  Filled 2021-05-02 (×8): qty 2

## 2021-05-02 MED ORDER — POLYETHYLENE GLYCOL 3350 17 G PO PACK
17.0000 g | PACK | Freq: Two times a day (BID) | ORAL | Status: DC
  Administered 2021-05-02 – 2021-05-11 (×10): 17 g via ORAL
  Filled 2021-05-02 (×13): qty 1

## 2021-05-02 MED ORDER — CYANOCOBALAMIN 1000 MCG/ML IJ SOLN
1000.0000 ug | Freq: Every day | INTRAMUSCULAR | Status: AC
  Administered 2021-05-02: 1000 ug via SUBCUTANEOUS
  Filled 2021-05-02: qty 1

## 2021-05-02 NOTE — Progress Notes (Signed)
ANTICOAGULATION CONSULT NOTE - Follow Up Consult  Pharmacy Consult for heparin Indication:  VTE treatment  Allergies  Allergen Reactions   Allegra [Fexofenadine Hcl] Other (See Comments)    "takes me to another world"   Ativan [Lorazepam] Other (See Comments)    Hallucinations   Azithromycin Nausea And Vomiting   Codeine Hives   Doxycycline Hyclate Nausea Only   Penicillins Hives    Has patient had a PCN reaction causing immediate rash, facial/tongue/throat swelling, SOB or lightheadedness with hypotension: no Has patient had a PCN reaction causing severe rash involving mucus membranes or skin necrosis: no Has patient had a PCN reaction that required hospitalization: no Has patient had a PCN reaction occurring within the last 10 years: no If all of the above answers are "NO", then may proceed with Cephalosporin use.    Trazodone And Nefazodone Nausea Only   Zyrtec [Cetirizine] Other (See Comments)    "takes me to another world"   Patient Measurements: Heparin Dosing Weight: 65 kg   Vital Signs: Temp: 98.1 F (36.7 C) (09/18 0400) Temp Source: Oral (09/18 0400) BP: 112/57 (09/17 2309) Pulse Rate: 92 (09/18 0400)  Labs: Recent Labs    04/30/21 0127 05/01/21 0038 05/01/21 0943 05/01/21 1826 05/02/21 0301  HGB 10.9* 11.0*  --   --   --   HCT 33.2* 34.0*  --   --   --   PLT 265 292  --   --   --   HEPARINUNFRC 0.53 0.78* 0.74* 0.66 0.59  CREATININE 1.17* 1.05*  --   --   --     Estimated Creatinine Clearance: 41.7 mL/min (A) (by C-G formula based on SCr of 1.05 mg/dL (H)).  Medical History: Past Medical History:  Diagnosis Date   Anxiety    Dementia ([place])    High cholesterol    Vertigo     Assessment: 58 YOF who recently had a R hip fracture s/p surgery here with acute DVT to start IV heparin for VTE treatment (acute L CIV thrombus on ultrasound). Plan for mechanical thrombectomy on 9/19.  Heparin level 0.59, therapeutic Hgb stable, plt stable, no s/s of  bleeding noted.  Goal of Therapy:  Heparin level 0.3-0.7 units/ml Monitor platelets by anticoagulation protocol: Yes   Plan:  Continue heparin infusion at 900 units/hr  F/u daily heparin level Monitor for s/s of bleeding.  Thank you for allowing pharmacy to participate in this patient's care.  Levonne Spiller, PharmD PGY1 Acute Care Resident  05/02/2021,8:20 AM

## 2021-05-02 NOTE — TOC Progression Note (Addendum)
Transition of Care [facility]) - Progression Note    Patient Details  Name: Wendy Watts MRN: [id number] Date of Birth: 16-Mar-1946  Transition of [facility]) CM/SW Contact  Carles Collet, RN Phone Number: 05/02/2021, 12:23 PM  Clinical Narrative:    Damaris Schooner w patient's daughter to discuss DC plan.  [name] confirms that her mom is out of covered SNF days, she was released from [facility] on 9/7. She understands to be eligible for a new benefit period, and additional days of inpatient coverage at a SNF, you must remain out of the hospital or SNF for 60 days in a row. She confirms that they cannot afford to go to SNF with a copay.  She states that her mom lives at home with her dad and brother and they can provide 24 hour supervision.   She states that Miquel Dunn had arranged [place] through [facility] on 9/5, but was told by [place] that they did not receive referral until 9/12, the day the patient returned to the hospital, therefore the patient was not seen at home prior to admission. [name] is agreeable to use [facility] again, and I have reached out to liaison to see if they would accept referral. Amy w [facility] states they can resume services, will need [place] orders.  [name] states that they have slide board, 3/1, shower seat, and RW, and are renting WC. DME was ordered by [facility] prior to her DC.  PT recs are for [facility], if [facility] cannot accept,  she will return home w [place].     Expected Discharge Plan: [place] Barriers to Discharge: Continued Medical Work up  Expected Discharge Plan and Services Expected Discharge Plan: [place] In-house Referral: Clinical Social Work Discharge Planning Services: CM Consult Post Acute Care Choice: [place] (would like SNF, however out of SNF days, and cannot pay copay out of pocket) Living arrangements for the past 2 months: [place]: [place] Date [place]: 05/02/21 Time [place]: 1222 Representative spoke with at [place]: Amy   Social Determinants of Health ([place]) Interventions    Readmission Risk Interventions No flowsheet data found.

## 2021-05-02 NOTE — Progress Notes (Signed)
PROGRESS NOTE    Wendy Watts  [id number] DOB: [birth year] DOA: 04/26/2021 PCP: [name], MD    Brief Narrative:  75 y.o. female with history of advanced dementia who was recently admitted for right hip fracture discharged to rehab on March 30, 2021-brought to the ED on 9/12 for significant LLE swelling along with nausea/vomiting-patient was found to have extensive LLE DVT and AKI.  She was subsequently admitted to the hospitalist service-see below for further details.  Subjective:  Asking for ice-no other complaints.  Pleasantly confused.  Objective: Vitals: Blood pressure (!) 107/50, pulse 65, temperature 98.4 F (36.9 C), temperature source Oral, resp. rate 20, height '5\' 5"'$  (1.651 m), weight 65.1 kg, SpO2 96 %.   Exam: Gen Exam:Alert awake-not in any distress HEENT:atraumatic, normocephalic Chest: B/L clear to auscultation anteriorly CVS:S1S2 regular Abdomen:soft non tender, non distended Extremities:no edema Neurology: Non focal Skin: no rash   Pertinent labs/radiology: Ferritin: 446  Doppler on 9/13: DVT involving left CFV, SF junction, left femoral vein, left proximal profunda vein, left popliteal vein, left posterior tibial vein, left peroneal vein, left gastrocnemius vein  Assessment & Plan: AKI: Hemodynamically mediated in the setting of nausea/vomiting-no hydronephrosis seen on CT abdomen-UA without proteinuria.  Creatinine markedly improved after IV fluid hydration.  Continue to monitor electrolytes periodically.  SIRS: Noninfectious etiology-likely from swelling/inflammation from extensive LLE DVT.  UA/CXR/blood cultures remain negative.  All antimicrobial therapy discontinued on 9/14.  Leukocytosis is downtrending and stable.  Nausea/vomiting: Resolved-could have had a transient viral syndrome.  CT abdomen negative for any acute abnormalities.  HIDA scan was negative for cholecystitis.  Tolerating advancement in diet.  Extensive LLE DVT: Continue  heparin infusion-vascular surgery planning mechanical thrombectomy early next week.  Suspect likely provoked by recent surgery   Constipation: Significant stool burden seen on CT-given Dulcolax suppository with significant results-increase MiraLAX to twice daily dosing-continue Senokot-reassess on 9/19.  Recent right hip fracture-s/p ORIF on 8/14: Per last orthopedic note on 8/15-nonweightbearing for right lower extremity (but okay for touchdown weightbearing)  Dementia: Pleasantly confused-continue Aricept/Namenda/BuSpar.  Oral thrush: Start nystatin x7 days.  Vitamin B12 deficiency: Started supplementation on 9/18.  Failure to thrive syndrome: Per patient's daughter-ever since she had recent hip fracture-her appetite has been poor-and has been failing to thrive syndrome.  Per nursing staff-continues to have poor oral intake-have started Remeron.  Nutrition Status: Nutrition Problem: Moderate Malnutrition Etiology: chronic illness (dementia) Signs/Symptoms: mild fat depletion, moderate fat depletion, mild muscle depletion, moderate muscle depletion, energy intake < 75% for > or equal to 1 month Interventions: Boost Breeze, MVI, Prostat     DVT prophylaxis: Heparin gtt Code Status: Full Family Communication: Daughter-Shea-[phone]-updated over the phone on 9/17  Status is: Inpatient  Remains inpatient appropriate because:Inpatient level of care appropriate due to severity of illness  Dispo: The patient is from: Home              Anticipated d/c is to:  Unclear at this time              Patient currently is not medically stable to d/c.   Difficult to place patient No    Consultants:  Vascular Surgery  Procedures:    Antimicrobials: Anti-infectives (From admission, onward)    Start     Dose/Rate Route Frequency Ordered Stop   04/27/21 0000  ceFEPIme (MAXIPIME) 2 g in sodium chloride 0.9 % 100 mL IVPB  Status:  Discontinued        2 g 200 mL/hr  over 30 Minutes  Intravenous Every 24 hours 04/26/21 2128 04/28/21 1626   04/26/21 2200  metroNIDAZOLE (FLAGYL) IVPB 500 mg  Status:  Discontinued        500 mg 100 mL/hr over 60 Minutes Intravenous Every 12 hours 04/26/21 2124 04/28/21 1626   04/26/21 2130  ceFEPIme (MAXIPIME) 2 g in sodium chloride 0.9 % 100 mL IVPB  Status:  Discontinued        2 g 200 mL/hr over 30 Minutes Intravenous  Once 04/26/21 2124 04/28/21 1626       Filed Weights   04/27/21 0800 04/27/21 2146 05/01/21 0400  Weight: 64.4 kg 69.8 kg 65.1 kg    Data Reviewed: I have personally reviewed following labs and imaging studies  CBC: Recent Labs  Lab 04/26/21 1849 04/27/21 0717 04/28/21 0035 04/29/21 0048 04/30/21 0127 05/01/21 0038  WBC 31.5* 29.5* 23.7* 14.4* 14.4* 12.4*  NEUTROABS 28.1* 26.4*  --   --   --   --   HGB 15.1* 12.3 11.7* 11.8* 10.9* 11.0*  HCT 45.4 36.7 35.5* 36.0 33.2* 34.0*  MCV 92.1 93.1 92.9 94.0 93.8 94.2  PLT 251 203 206 224 [id number]    Basic Metabolic Panel: Recent Labs  Lab 04/27/21 0717 04/28/21 0035 04/29/21 0048 04/30/21 0127 05/01/21 0038  NA 135 135 138 140 139  K 4.3 4.0 3.8 3.8 3.7  CL 99 101 103 107 106  CO2 '23 22 24 24 24  '$ GLUCOSE 140* 99 87 102* 87  BUN 33* 37* 38* 31* 28*  CREATININE 2.70* 2.09* 1.41* 1.17* 1.05*  CALCIUM 9.8 9.6 9.5 10.0 10.1    GFR: Estimated Creatinine Clearance: 41.7 mL/min (A) (by C-G formula based on SCr of 1.05 mg/dL (H)). Liver Function Tests: Recent Labs  Lab 04/26/21 1849 04/27/21 0717 04/28/21 0035  AST '21 16 16  '$ ALT '22 17 17  '$ ALKPHOS 158* 109 109  BILITOT 1.1 1.1 1.1  PROT 7.5 5.9* 5.5*  ALBUMIN 3.4* 2.7* 2.5*    Recent Labs  Lab 04/26/21 1849  LIPASE 56*    No results for input(s): AMMONIA in the last 168 hours. Coagulation Profile: No results for input(s): INR, PROTIME in the last 168 hours. Cardiac Enzymes: No results for input(s): CKTOTAL, CKMB, CKMBINDEX, TROPONINI in the last 168 hours. BNP (last 3 results) No results  for input(s): PROBNP in the last 8760 hours. HbA1C: No results for input(s): HGBA1C in the last 72 hours. CBG: No results for input(s): GLUCAP in the last 168 hours. Lipid Profile: No results for input(s): CHOL, HDL, LDLCALC, TRIG, CHOLHDL, LDLDIRECT in the last 72 hours. Thyroid Function Tests: No results for input(s): TSH, T4TOTAL, FREET4, T3FREE, THYROIDAB in the last 72 hours. Anemia Panel: Recent Labs    05/02/21 0301  VITAMINB12 186  FOLATE 10.0  FERRITIN 446*  TIBC 150*  IRON 49  RETICCTPCT 1.5   Sepsis Labs: Recent Labs  Lab 04/26/21 1850 04/26/21 2050  LATICACIDVEN 3.7* 2.4*     Recent Results (from the past 240 hour(s))  Resp Panel by RT-PCR (Flu A&B, Covid) Nasopharyngeal Swab     Status: None   Collection Time: 04/26/21  6:58 PM   Specimen: Nasopharyngeal Swab; Nasopharyngeal(NP) swabs in vial transport medium  Result Value Ref Range Status   SARS Coronavirus 2 by RT PCR NEGATIVE NEGATIVE Final    Comment: (NOTE) SARS-CoV-2 target nucleic acids are NOT DETECTED.  The SARS-CoV-2 RNA is generally detectable in upper respiratory specimens during the acute phase of infection.  The lowest concentration of SARS-CoV-2 viral copies this assay can detect is 138 copies/mL. A negative result does not preclude SARS-Cov-2 infection and should not be used as the sole basis for treatment or other patient management decisions. A negative result may occur with  improper specimen collection/handling, submission of specimen other than nasopharyngeal swab, presence of viral mutation(s) within the areas targeted by this assay, and inadequate number of viral copies(<138 copies/mL). A negative result must be combined with clinical observations, patient history, and epidemiological information. The expected result is Negative.  Fact Sheet for Patients:  EntrepreneurPulse.com.au  Fact Sheet for Healthcare Providers:   IncredibleEmployment.be  This test is no t yet approved or cleared by the [place] FDA and  has been authorized for detection and/or diagnosis of SARS-CoV-2 by FDA under an Emergency Use Authorization (EUA). This EUA will remain  in effect (meaning this test can be used) for the duration of the COVID-19 declaration under Section 564(b)(1) of the Act, 21 U.S.C.section 360bbb-3(b)(1), unless the authorization is terminated  or revoked sooner.       Influenza A by PCR NEGATIVE NEGATIVE Final   Influenza B by PCR NEGATIVE NEGATIVE Final    Comment: (NOTE) The Xpert Xpress SARS-CoV-2/FLU/RSV plus assay is intended as an aid in the diagnosis of influenza from Nasopharyngeal swab specimens and should not be used as a sole basis for treatment. Nasal washings and aspirates are unacceptable for Xpert Xpress SARS-CoV-2/FLU/RSV testing.  Fact Sheet for Patients: EntrepreneurPulse.com.au  Fact Sheet for Healthcare Providers: IncredibleEmployment.be  This test is not yet approved or cleared by the [place] FDA and has been authorized for detection and/or diagnosis of SARS-CoV-2 by FDA under an Emergency Use Authorization (EUA). This EUA will remain in effect (meaning this test can be used) for the duration of the COVID-19 declaration under Section 564(b)(1) of the Act, 21 U.S.C. section 360bbb-3(b)(1), unless the authorization is terminated or revoked.  Performed at [facility], [place] [address] [place], [place] [address]   Blood culture (routine x 2)     Status: None   Collection Time: 04/26/21 10:35 PM   Specimen: BLOOD RIGHT FOREARM  Result Value Ref Range Status   Specimen Description BLOOD RIGHT FOREARM  Final   Special Requests   Final    BOTTLES DRAWN AEROBIC AND ANAEROBIC Blood Culture results may not be optimal due to an inadequate volume of blood received in culture bottles   Culture   Final    NO GROWTH  5 DAYS Performed at [facility], [place] [address] [place], [place] [address]    Report Status 05/01/2021 FINAL  Final      Radiology Studies: No results found.  Scheduled Meds:  (feeding supplement) PROSource Plus  30 mL Oral BID BM   busPIRone  10 mg Oral BID   cyanocobalamin  1,000 mcg Subcutaneous Q2000   diphenhydrAMINE  25 mg Oral Once   donepezil  10 mg Oral QHS   feeding supplement  1 Container Oral TID BM   memantine  10 mg Oral BID   mirtazapine  7.5 mg Oral QHS   multivitamin with minerals  1 tablet Oral Daily   nystatin  5 mL Oral QID   polyethylene glycol  17 g Oral Daily   senna  1 tablet Oral QHS   Continuous Infusions:  heparin 900 Units/hr (05/01/21 1622)   promethazine (PHENERGAN) injection (IM or IVPB) 6.25 mg (04/28/21 0045)     LOS: 6 days  Oren Binet, MD [facility] Pager On Amion  If 7PM-7AM, please contact night-coverage 05/02/2021, 10:00 AM

## 2021-05-03 ENCOUNTER — Inpatient Hospital Stay (HOSPITAL_COMMUNITY): Payer: PPO

## 2021-05-03 DIAGNOSIS — E440 Moderate protein-calorie malnutrition: Secondary | ICD-10-CM | POA: Diagnosis not present

## 2021-05-03 DIAGNOSIS — F0390 Unspecified dementia without behavioral disturbance: Secondary | ICD-10-CM | POA: Diagnosis not present

## 2021-05-03 DIAGNOSIS — I82442 Acute embolism and thrombosis of left tibial vein: Secondary | ICD-10-CM | POA: Diagnosis not present

## 2021-05-03 DIAGNOSIS — R112 Nausea with vomiting, unspecified: Secondary | ICD-10-CM | POA: Diagnosis not present

## 2021-05-03 DIAGNOSIS — N179 Acute kidney failure, unspecified: Secondary | ICD-10-CM | POA: Diagnosis not present

## 2021-05-03 DIAGNOSIS — I82412 Acute embolism and thrombosis of left femoral vein: Secondary | ICD-10-CM | POA: Diagnosis not present

## 2021-05-03 DIAGNOSIS — I82432 Acute embolism and thrombosis of left popliteal vein: Secondary | ICD-10-CM | POA: Diagnosis not present

## 2021-05-03 DIAGNOSIS — I82452 Acute embolism and thrombosis of left peroneal vein: Secondary | ICD-10-CM | POA: Diagnosis not present

## 2021-05-03 LAB — CBC
HCT: 38.9 % (ref 36.0–46.0)
Hemoglobin: 12.8 g/dL (ref 12.0–15.0)
MCH: 30.8 pg (ref 26.0–34.0)
MCHC: 32.9 g/dL (ref 30.0–36.0)
MCV: 93.5 fL (ref 80.0–100.0)
Platelets: 255 10*3/uL (ref 150–400)
RBC: 4.16 MIL/uL (ref 3.87–5.11)
RDW: 13.5 % (ref 11.5–15.5)
WBC: 11.6 10*3/uL — ABNORMAL HIGH (ref 4.0–10.5)
nRBC: 0 % (ref 0.0–0.2)

## 2021-05-03 LAB — HEPARIN LEVEL (UNFRACTIONATED): Heparin Unfractionated: 0.43 IU/mL (ref 0.30–0.70)

## 2021-05-03 NOTE — Progress Notes (Addendum)
  Progress Note    05/03/2021 8:10 AM * No surgery date entered *  Subjective:  Patient thinks she is in her bed at home in the year 1999.  She is not having any left leg pain   Vitals:   05/03/21 0042 05/03/21 0400  BP: (!) 120/52 (!) 92/56  Pulse: 67 (!) 59  Resp: 17 14  Temp: 97.9 F (36.6 C) 97.9 F (36.6 C)  SpO2: 98% 94%   Physical Exam: Lungs:  non labored  Extremities:  palpable L ATA; LLE edematous Abdomen:  soft Neurologic: confused  CBC    Component Value Date/Time   WBC 12.4 (H) 05/01/2021 0038   RBC 3.73 (L) 05/02/2021 0301   RBC 3.61 (L) 05/01/2021 0038   HGB 11.0 (L) 05/01/2021 0038   HGB 13.7 05/08/2019 1156   HCT 34.0 (L) 05/01/2021 0038   HCT 41.4 05/08/2019 1156   PLT 292 05/01/2021 0038   PLT 357 05/08/2019 1156   MCV 94.2 05/01/2021 0038   MCV 92 05/08/2019 1156   MCH 30.5 05/01/2021 0038   MCHC 32.4 05/01/2021 0038   RDW 13.6 05/01/2021 0038   RDW 12.5 05/08/2019 1156   LYMPHSABS 0.8 04/27/2021 0717   MONOABS 2.0 (H) 04/27/2021 0717   EOSABS 0.0 04/27/2021 0717   BASOSABS 0.1 04/27/2021 0717    BMET    Component Value Date/Time   NA 139 05/01/2021 0038   NA 142 05/08/2019 1156   K 3.7 05/01/2021 0038   CL 106 05/01/2021 0038   CO2 24 05/01/2021 0038   GLUCOSE 87 05/01/2021 0038   BUN 28 (H) 05/01/2021 0038   BUN 11 05/08/2019 1156   CREATININE 1.05 (H) 05/01/2021 0038   CALCIUM 10.1 05/01/2021 0038   GFRNONAA 55 (L) 05/01/2021 0038   GFRAA 72 05/08/2019 1156    INR    Component Value Date/Time   INR 1.60 (H) 02/08/2010 0631     Intake/Output Summary (Last 24 hours) at 05/03/2021 0810 Last data filed at 05/03/2021 0500 Gross per 24 hour  Intake --  Output 1050 ml  Net -1050 ml     Assessment/Plan:  75 y.o. female is s/p LLE DVT * No surgery date entered *   LLE edematous but well perfused without venous compartment syndrome Tentatively scheduled for LLE venous thrombectomy tomorrow with Dr. Trula Slade BMP  pending   Dagoberto Ligas, PA-C [facility] [phone] 05/03/2021 8:10 AM   I agree with the above.  She is scheduled for thrombectomy of the left leg DVT tomorrow.  Daughter updated by phone.  NPO after midnight  [place] Xzayvion Vaeth

## 2021-05-03 NOTE — Progress Notes (Signed)
Patient refused all oral morning medication. Dr. Sloan Leiter notified of patient's refusal. Did not agree to take pills in applesauce/ puree.

## 2021-05-03 NOTE — Progress Notes (Signed)
PROGRESS NOTE    Wendy Watts  [id number] DOB: Apr 12, 1946 DOA: 04/26/2021 PCP: [name], MD    Brief Narrative:  75 y.o. female with history of advanced dementia who was recently admitted for right hip fracture discharged to rehab on March 30, 2021-brought to the ED on 9/12 for significant LLE swelling along with nausea/vomiting-patient was found to have extensive LLE DVT and AKI.  She was subsequently admitted to the hospitalist service-see below for further details.  Subjective:  Lying comfortably in bed-pleasantly confused-answering simple questions appropriately at times.  Objective: Vitals: Blood pressure 93/73, pulse 66, temperature 97.8 F (36.6 C), temperature source Axillary, resp. rate 16, height '5\' 5"'$  (1.651 m), weight 64.6 kg, SpO2 97 %.   Exam: Gen Exam:Alert awake-not in any distress HEENT:atraumatic, normocephalic Chest: B/L clear to auscultation anteriorly CVS:S1S2 regular Abdomen:soft non tender, non distended Extremities: Continues to have edema and LLE. Neurology: Non focal Skin: no rash    Pertinent labs/radiology: Ferritin: 446 Hb: 12.8  Doppler on 9/13: DVT involving left CFV, SF junction, left femoral vein, left proximal profunda vein, left popliteal vein, left posterior tibial vein, left peroneal vein, left gastrocnemius vein  Assessment & Plan: AKI: Hemodynamically mediated in the setting of nausea/vomiting-no hydronephrosis seen on CT abdomen-UA without proteinuria.  Creatinine markedly improved after IV fluid hydration.  Continue to monitor electrolytes periodically.  SIRS: Noninfectious etiology-likely from swelling/inflammation from extensive LLE DVT.  UA/CXR/blood cultures remain negative.  All antimicrobial therapy discontinued on 9/14.  Leukocytosis has normalized.  Nausea/vomiting: Resolved-could have had a transient viral syndrome.  CT abdomen negative for any acute abnormalities.  HIDA scan was negative for cholecystitis.   Tolerating advancement in diet.  Extensive LLE DVT: Continue heparin infusion-vascular surgery planning mechanical thrombectomy on 9/20.  Suspect DVT provoked by recent surgery.    Constipation: Significant stool burden seen on CT-given Dulcolax suppository with significant results-continue MiraLAX/senna.  Recent right hip fracture-s/p ORIF on 8/14: Per last orthopedic note on 8/15-nonweightbearing for right lower extremity (but okay for touchdown weightbearing)  Dementia: Pleasantly confused-continue Aricept/Namenda/BuSpar.  Oral thrush: Start nystatin x7 days.  Vitamin B12 deficiency: Started supplementation on 9/18.  Failure to thrive syndrome: Per patient's daughter-ever since she had recent hip fracture-her appetite has been poor-and has been failing to thrive syndrome.  Per nursing staff-continues to have poor oral intake-have started Remeron.  Nutrition Status: Nutrition Problem: Moderate Malnutrition Etiology: chronic illness (dementia) Signs/Symptoms: mild fat depletion, moderate fat depletion, mild muscle depletion, moderate muscle depletion, energy intake < 75% for > or equal to 1 month Interventions: Boost Breeze, MVI, Prostat     DVT prophylaxis: Heparin gtt Code Status: Full Family Communication: Daughter-[phone]-updated over the phone on 9/17  Status is: Inpatient  Remains inpatient appropriate because:Inpatient level of care appropriate due to severity of illness  Dispo: The patient is from: Home              Anticipated d/c is to:  Unclear at this time              Patient currently is not medically stable to d/c.   Difficult to place patient No    Consultants:  Vascular Surgery  Procedures:    Antimicrobials: Anti-infectives (From admission, onward)    Start     Dose/Rate Route Frequency Ordered Stop   04/27/21 0000  ceFEPIme (MAXIPIME) 2 g in sodium chloride 0.9 % 100 mL IVPB  Status:  Discontinued        2 g 200 mL/hr  over 30 Minutes  Intravenous Every 24 hours 04/26/21 2128 04/28/21 1626   04/26/21 2200  metroNIDAZOLE (FLAGYL) IVPB 500 mg  Status:  Discontinued        500 mg 100 mL/hr over 60 Minutes Intravenous Every 12 hours 04/26/21 2124 04/28/21 1626   04/26/21 2130  ceFEPIme (MAXIPIME) 2 g in sodium chloride 0.9 % 100 mL IVPB  Status:  Discontinued        2 g 200 mL/hr over 30 Minutes Intravenous  Once 04/26/21 2124 04/28/21 1626       Filed Weights   04/27/21 2146 05/01/21 0400 05/03/21 0422  Weight: 69.8 kg 65.1 kg 64.6 kg    Data Reviewed: I have personally reviewed following labs and imaging studies  CBC: Recent Labs  Lab 04/26/21 1849 04/27/21 0717 04/28/21 0035 04/29/21 0048 04/30/21 0127 05/01/21 0038 05/03/21 1019  WBC 31.5* 29.5* 23.7* 14.4* 14.4* 12.4* 11.6*  NEUTROABS 28.1* 26.4*  --   --   --   --   --   HGB 15.1* 12.3 11.7* 11.8* 10.9* 11.0* 12.8  HCT 45.4 36.7 35.5* 36.0 33.2* 34.0* 38.9  MCV 92.1 93.1 92.9 94.0 93.8 94.2 93.5  PLT 251 203 206 224 265 [id number]    Basic Metabolic Panel: Recent Labs  Lab 04/27/21 0717 04/28/21 0035 04/29/21 0048 04/30/21 0127 05/01/21 0038  NA 135 135 138 140 139  K 4.3 4.0 3.8 3.8 3.7  CL 99 101 103 107 106  CO2 '23 22 24 24 24  '$ GLUCOSE 140* 99 87 102* 87  BUN 33* 37* 38* 31* 28*  CREATININE 2.70* 2.09* 1.41* 1.17* 1.05*  CALCIUM 9.8 9.6 9.5 10.0 10.1    GFR: Estimated Creatinine Clearance: 41.7 mL/min (A) (by C-G formula based on SCr of 1.05 mg/dL (H)). Liver Function Tests: Recent Labs  Lab 04/26/21 1849 04/27/21 0717 04/28/21 0035  AST '21 16 16  '$ ALT '22 17 17  '$ ALKPHOS 158* 109 109  BILITOT 1.1 1.1 1.1  PROT 7.5 5.9* 5.5*  ALBUMIN 3.4* 2.7* 2.5*    Recent Labs  Lab 04/26/21 1849  LIPASE 56*    No results for input(s): AMMONIA in the last 168 hours. Coagulation Profile: No results for input(s): INR, PROTIME in the last 168 hours. Cardiac Enzymes: No results for input(s): CKTOTAL, CKMB, CKMBINDEX, TROPONINI in the last  168 hours. BNP (last 3 results) No results for input(s): PROBNP in the last 8760 hours. HbA1C: No results for input(s): HGBA1C in the last 72 hours. CBG: No results for input(s): GLUCAP in the last 168 hours. Lipid Profile: No results for input(s): CHOL, HDL, LDLCALC, TRIG, CHOLHDL, LDLDIRECT in the last 72 hours. Thyroid Function Tests: No results for input(s): TSH, T4TOTAL, FREET4, T3FREE, THYROIDAB in the last 72 hours. Anemia Panel: Recent Labs    05/02/21 0301  VITAMINB12 186  FOLATE 10.0  FERRITIN 446*  TIBC 150*  IRON 49  RETICCTPCT 1.5    Sepsis Labs: Recent Labs  Lab 04/26/21 1850 04/26/21 2050  LATICACIDVEN 3.7* 2.4*     Recent Results (from the past 240 hour(s))  Resp Panel by RT-PCR (Flu A&B, Covid) Nasopharyngeal Swab     Status: None   Collection Time: 04/26/21  6:58 PM   Specimen: Nasopharyngeal Swab; Nasopharyngeal(NP) swabs in vial transport medium  Result Value Ref Range Status   SARS Coronavirus 2 by RT PCR NEGATIVE NEGATIVE Final    Comment: (NOTE) SARS-CoV-2 target nucleic acids are NOT DETECTED.  The SARS-CoV-2 RNA is generally  detectable in upper respiratory specimens during the acute phase of infection. The lowest concentration of SARS-CoV-2 viral copies this assay can detect is 138 copies/mL. A negative result does not preclude SARS-Cov-2 infection and should not be used as the sole basis for treatment or other patient management decisions. A negative result may occur with  improper specimen collection/handling, submission of specimen other than nasopharyngeal swab, presence of viral mutation(s) within the areas targeted by this assay, and inadequate number of viral copies(<138 copies/mL). A negative result must be combined with clinical observations, patient history, and epidemiological information. The expected result is Negative.  Fact Sheet for Patients:  EntrepreneurPulse.com.au  Fact Sheet for Healthcare  Providers:  IncredibleEmployment.be  This test is no t yet approved or cleared by the [place] FDA and  has been authorized for detection and/or diagnosis of SARS-CoV-2 by FDA under an Emergency Use Authorization (EUA). This EUA will remain  in effect (meaning this test can be used) for the duration of the COVID-19 declaration under Section 564(b)(1) of the Act, 21 U.S.C.section 360bbb-3(b)(1), unless the authorization is terminated  or revoked sooner.       Influenza A by PCR NEGATIVE NEGATIVE Final   Influenza B by PCR NEGATIVE NEGATIVE Final    Comment: (NOTE) The Xpert Xpress SARS-CoV-2/FLU/RSV plus assay is intended as an aid in the diagnosis of influenza from Nasopharyngeal swab specimens and should not be used as a sole basis for treatment. Nasal washings and aspirates are unacceptable for Xpert Xpress SARS-CoV-2/FLU/RSV testing.  Fact Sheet for Patients: EntrepreneurPulse.com.au  Fact Sheet for Healthcare Providers: IncredibleEmployment.be  This test is not yet approved or cleared by the [place] FDA and has been authorized for detection and/or diagnosis of SARS-CoV-2 by FDA under an Emergency Use Authorization (EUA). This EUA will remain in effect (meaning this test can be used) for the duration of the COVID-19 declaration under Section 564(b)(1) of the Act, 21 U.S.C. section 360bbb-3(b)(1), unless the authorization is terminated or revoked.  Performed at [facility], [place] [address] [place], [place] [address]   Blood culture (routine x 2)     Status: None   Collection Time: 04/26/21 10:35 PM   Specimen: BLOOD RIGHT FOREARM  Result Value Ref Range Status   Specimen Description BLOOD RIGHT FOREARM  Final   Special Requests   Final    BOTTLES DRAWN AEROBIC AND ANAEROBIC Blood Culture results may not be optimal due to an inadequate volume of blood received in culture bottles   Culture   Final     NO GROWTH 5 DAYS Performed at [facility], [place] [address] [place], [place] [address]    Report Status 05/01/2021 FINAL  Final      Radiology Studies: No results found.  Scheduled Meds:  (feeding supplement) PROSource Plus  30 mL Oral BID BM   busPIRone  10 mg Oral BID   donepezil  10 mg Oral QHS   feeding supplement  1 Container Oral TID BM   memantine  10 mg Oral BID   mirtazapine  7.5 mg Oral QHS   multivitamin with minerals  1 tablet Oral Daily   nystatin  5 mL Oral QID   polyethylene glycol  17 g Oral BID   senna  2 tablet Oral QHS   Continuous Infusions:  heparin 900 Units/hr (05/02/21 1832)   promethazine (PHENERGAN) injection (IM or IVPB) 6.25 mg (04/28/21 0045)     LOS: 7 days   Oren Binet, MD  [facility] Pager On [place]  If 7PM-7AM, please contact night-coverage 05/03/2021, 12:02 PM

## 2021-05-03 NOTE — Progress Notes (Addendum)
Speech Language Pathology Treatment: Dysphagia  Patient Details Name: Wendy Watts MRN: [id number] DOB: [birth year] Today's Date: 05/03/2021 Time: [id number] SLP Time Calculation (min) (ACUTE ONLY): 12 min  Assessment / Plan / Recommendation Clinical Impression  F/u after 9/16 clinical swallowing assessment. Pt sitting in recliner with breakfast tray in front of her, generally untouched.  Required max encouragement to eat/drink any items from her tray or supplemental food brought by SLP.  She allowed two pieces of graham cracker and several sips of water but could not be persuaded to have any more than that. Notably, pt coughed after every swallow, regardless of consistency.  Chart review reveals generally clear BS - no changes in lung status.    Recommend consideration of MBS next date to assess any correlation of coughing with obvious swallow dysfunction.  (Radiology schedule is full today.) Continue efforts to encourage PO intake.  Addendum: Radiology able to schedule pt for 1:30 pm today. D/W RN.     HPI HPI: Pt is a 75 y.o. female who was admitted secondary to nausea, vomiting and left lower extremity swelling. Pt's daughter reported to MD that pt has been having poor appetite and failure to thrive syndrome since recent surgery. CXR 9/12: Very mild left basilar atelectasis. PMH: advanced dementia, recent right hip fracture-s/p ORIF on 8/14      SLP Plan  MBS      Recommendations for follow up therapy are one component of a multi-disciplinary discharge planning process, led by the attending physician.  Recommendations may be updated based on patient status, additional functional criteria and insurance authorization.    Recommendations  Diet recommendations: Dysphagia 3 (mechanical soft);Thin liquid Liquids provided via: Cup Medication Administration: Crushed with puree Supervision: Staff to assist with self feeding Compensations: Slow rate;Small sips/bites                Oral  Care Recommendations: Oral care BID Follow up Recommendations: Other (comment) (tba) SLP Visit Diagnosis: Dysphagia, unspecified (R13.10) Plan: MBS       GO               Wendy Watts, [place] CCC/SLP Acute Rehabilitation Services Office number [phone] Pager [phone]  Wendy Watts 05/03/2021, 11:02 AM

## 2021-05-03 NOTE — Progress Notes (Addendum)
ANTICOAGULATION CONSULT NOTE - Follow Up Consult  Pharmacy Consult for heparin Indication: acute DVT  Allergies  Allergen Reactions   Allegra [Fexofenadine Hcl] Other (See Comments)    "takes me to another world"   Ativan [Lorazepam] Other (See Comments)    Hallucinations   Azithromycin Nausea And Vomiting   Codeine Hives   Doxycycline Hyclate Nausea Only   Penicillins Hives    Has patient had a PCN reaction causing immediate rash, facial/tongue/throat swelling, SOB or lightheadedness with hypotension: no Has patient had a PCN reaction causing severe rash involving mucus membranes or skin necrosis: no Has patient had a PCN reaction that required hospitalization: no Has patient had a PCN reaction occurring within the last 10 years: no If all of the above answers are "NO", then may proceed with Cephalosporin use.    Trazodone And Nefazodone Nausea Only   Zyrtec [Cetirizine] Other (See Comments)    "takes me to another world"   Patient Measurements: Heparin Dosing Weight: 65 kg   Vital Signs: Temp: 97.9 F (36.6 C) (09/19 0400) Temp Source: Axillary (09/19 0400) BP: 92/56 (09/19 0400) Pulse Rate: 59 (09/19 0400)  Labs: Recent Labs    05/01/21 0038 05/01/21 0943 05/01/21 1826 05/02/21 0301 05/03/21 0235  HGB 11.0*  --   --   --   --   HCT 34.0*  --   --   --   --   PLT 292  --   --   --   --   HEPARINUNFRC 0.78*   < > 0.66 0.59 0.43  CREATININE 1.05*  --   --   --   --    < > = values in this interval not displayed.    Estimated Creatinine Clearance: 41.7 mL/min (A) (by C-G formula based on SCr of 1.05 mg/dL (H)).  Assessment: [name] who recently had a R hip fracture s/p surgery here with acute DVT to start IV heparin for VTE treatment (acute L CIV thrombus on ultrasound). Plan for mechanical thrombectomy on 9/20.  Heparin level 0.43, therapeutic. No s/s of bleeding noted, CBC is pending for today.  Goal of Therapy:  Heparin level 0.3-0.7 units/ml Monitor  platelets by anticoagulation protocol: Yes   Plan:  Continue heparin infusion at 900 units/hr  F/u daily heparin level, CBC Monitor for s/s of bleeding F/U transition to oral agent after thrombectomy  Thank you for involving pharmacy in this patient's care.  Renold Genta, PharmD, BCPS Clinical Pharmacist Clinical phone for 05/03/2021 until 3p is [phone] 05/03/2021 8:09 AM  **Pharmacist phone directory can be found on [email or web address] listed under [place]**   Addendum: CBC is stable.  Renold Genta, PharmD, BCPS 2:22 PM

## 2021-05-03 NOTE — Progress Notes (Signed)
Modified Barium Swallow Progress Note  Patient Details  Name: Wendy Watts MRN: [id number] Date of Birth: November 16, 1945  Today's Date: 05/03/2021  Modified Barium Swallow completed.  Full report located under Chart Review in the Imaging Section.  Brief recommendations include the following:  Clinical Impression  Limited MBS completed due to pt's reluctance to participate; therefore minimal consistencies and quantities could be assessed.  She demonstrated functional mastication of a small portion of cracker and 1/2 tspn of pureed barium with normal onset of the swallow and no obvious deficits.  She allowed sips of thin barium, demonstrating high penetration x1 (Penetration/aspiration score of 2, considered WFL) and one incident of penetration to the vocal folds when throwing her head into extension. There was no aspiration.  Pt was noted to cough throughout the exam - with both solids and liquids- which was not correlated with swallow dysfunction/airway invasion.  Notable on one occasion was backflow of thin liquids into the cervical esophagus - no hx of GERD or esophageal deficits in chart.  Recommend continuing with her current diet given no evidence of aspiration.  Will f/u x1 for education.   Swallow Evaluation Recommendations       SLP Diet Recommendations: Dysphagia 3 (Mech soft) solids;Thin liquid   Liquid Administration via: Cup;Straw   Medication Administration: Crushed with puree   Supervision: Patient able to self feed (needs 1;1 cues to eat)   Compensations: Minimize environmental distractions       Oral Care Recommendations: Oral care BID       Charlotta Lapaglia L. Tivis Ringer, [place] Office number [phone] Pager [phone]  [name] 05/03/2021,2:48 PM

## 2021-05-03 NOTE — H&P (View-Only) (Signed)
  Progress Note    05/03/2021 8:10 AM * No surgery date entered *  Subjective:  Patient thinks she is in her bed at home in the year 1999.  She is not having any left leg pain   Vitals:   05/03/21 0042 05/03/21 0400  BP: (!) 120/52 (!) 92/56  Pulse: 67 (!) 59  Resp: 17 14  Temp: 97.9 F (36.6 C) 97.9 F (36.6 C)  SpO2: 98% 94%   Physical Exam: Lungs:  non labored  Extremities:  palpable L ATA; LLE edematous Abdomen:  soft Neurologic: confused  CBC    Component Value Date/Time   WBC 12.4 (H) 05/01/2021 0038   RBC 3.73 (L) 05/02/2021 0301   RBC 3.61 (L) 05/01/2021 0038   HGB 11.0 (L) 05/01/2021 0038   HGB 13.7 05/08/2019 1156   HCT 34.0 (L) 05/01/2021 0038   HCT 41.4 05/08/2019 1156   PLT 292 05/01/2021 0038   PLT 357 05/08/2019 1156   MCV 94.2 05/01/2021 0038   MCV 92 05/08/2019 1156   MCH 30.5 05/01/2021 0038   MCHC 32.4 05/01/2021 0038   RDW 13.6 05/01/2021 0038   RDW 12.5 05/08/2019 1156   LYMPHSABS 0.8 04/27/2021 0717   MONOABS 2.0 (H) 04/27/2021 0717   EOSABS 0.0 04/27/2021 0717   BASOSABS 0.1 04/27/2021 0717    BMET    Component Value Date/Time   NA 139 05/01/2021 0038   NA 142 05/08/2019 1156   K 3.7 05/01/2021 0038   CL 106 05/01/2021 0038   CO2 24 05/01/2021 0038   GLUCOSE 87 05/01/2021 0038   BUN 28 (H) 05/01/2021 0038   BUN 11 05/08/2019 1156   CREATININE 1.05 (H) 05/01/2021 0038   CALCIUM 10.1 05/01/2021 0038   GFRNONAA 55 (L) 05/01/2021 0038   GFRAA 72 05/08/2019 1156    INR    Component Value Date/Time   INR 1.60 (H) 02/08/2010 0631     Intake/Output Summary (Last 24 hours) at 05/03/2021 0810 Last data filed at 05/03/2021 0500 Gross per 24 hour  Intake --  Output 1050 ml  Net -1050 ml     Assessment/Plan:  75 y.o. female is s/p LLE DVT * No surgery date entered *   LLE edematous but well perfused without venous compartment syndrome Tentatively scheduled for LLE venous thrombectomy tomorrow with Dr. Trula Slade BMP  pending   Dagoberto Ligas, PA-C [facility] [phone] 05/03/2021 8:10 AM   I agree with the above.  She is scheduled for thrombectomy of the left leg DVT tomorrow.  Daughter updated by phone.  NPO after midnight  [place] Kitrina Maurin

## 2021-05-03 NOTE — Progress Notes (Signed)
Inpatient Rehab Admissions Coordinator Note:   Per PT/OT patient was screened for CIR candidacy by Lon Klippel Danford Bad, CCC-SLP. At this time, pt appears to be a potential candidate for CIR. I will place an order for rehab consult for full assessment, per our protocol.  Please contact me any with questions.Gayland Curry, [place], [place] Admissions Coordinator [phone] 05/03/21 3:22 PM

## 2021-05-03 NOTE — Progress Notes (Signed)
Physical Therapy Treatment Patient Details Name: Wendy Watts MRN: [id number] DOB: 1945-11-20 Today's Date: 05/03/2021   History of Present Illness Pt is a 75 y.o. female admitted from home on 04/26/21 with nausea, vomiting, LLE swelling. CXR with mild L basilar atelectasis. Found to have extensive LLE DVT, AKI, SIRS. Plan for LLE DVT mechanical thrombectomy on 9/20. PMH includes advanced dementia, recent R hip fx s/p ORIF (03/28/21 with d/c to SNF for ~20 days per husband), HOH, vertigo, anxiety.    PT Comments    Patient requires incr time due to perseveration on wanting ice or tea. She follows commands except for maintaining 50% PWB when stepping (therefore limited to bed to chair transfer only).     Recommendations for follow up therapy are one component of a multi-disciplinary discharge planning process, led by the attending physician.  Recommendations may be updated based on patient status, additional functional criteria and insurance authorization.  Follow Up Recommendations  CIR;Supervision/Assistance - 24 hour     Equipment Recommendations  None recommended by PT    Recommendations for Other Services       Precautions / Restrictions Precautions Precautions: Fall;Other (comment) (cognitive deficits) Restrictions RLE Weight Bearing: Partial weight bearing RLE Partial Weight Bearing Percentage or Pounds: 50     Mobility  Bed Mobility Overal bed mobility: Needs Assistance Bed Mobility: Supine to Sit     Supine to sit: Min assist     General bed mobility comments: HOB flat, +rail, pt pulled herself up to long-sitting and needed cues/assist to pivot legs over EOB; pt then able to scoot hips to EOB with minguard assist    Transfers Overall transfer level: Needs assistance Equipment used: Rolling walker (2 wheeled) Transfers: Sit to/from [facility] Sit to Stand: Min assist;+2 safety/equipment Stand pivot transfers: Min assist;+2 safety/equipment        General transfer comment: vc for hand placement (pt tries to pull up on RW); assist to stabilize RW; no reports of pain in standing  Ambulation/Gait             General Gait Details: deferred as pt not maintaining 50% PWB when left foot leaves the floor; limited to transfer only   Stairs             Wheelchair Mobility    Modified Rankin (Stroke Patients Only)       Balance Overall balance assessment: Needs assistance   Sitting balance-Leahy Scale: Fair     Standing balance support: Bilateral upper extremity supported;During functional activity Standing balance-Leahy Scale: Poor Standing balance comment: reliant on UE support                            Cognition Arousal/Alertness: Awake/alert Behavior During Therapy: Restless (perseverated on needing ice) Overall Cognitive Status: History of cognitive impairments - at baseline                                 General Comments: followed simple commands with incr time, multiple cues      Exercises General Exercises - Lower Extremity Heel Slides: AAROM;Right;10 reps;Supine    General Comments        Pertinent Vitals/Pain Pain Assessment: Faces Faces Pain Scale: Hurts a little bit Pain Location: Rt knee with ROM (at endrange as pt with decr flexion) Pain Descriptors / Indicators: Discomfort;Guarding Pain Intervention(s): Limited activity within patient's tolerance;Monitored during session  Home Living                      Prior Function            PT Goals (current goals can now be found in the care plan section) Acute Rehab PT Goals Patient Stated Goal: Hopeful for more rehab before return home Time For Goal Achievement: 05/15/21 Potential to Achieve Goals: Fair Progress towards PT goals: Progressing toward goals    Frequency    Min 3X/week      PT Plan Current plan remains appropriate    Co-evaluation              AM-PAC PT "6 Clicks"  Mobility   Outcome Measure  Help needed turning from your back to your side while in a flat bed without using bedrails?: A Little Help needed moving from lying on your back to sitting on the side of a flat bed without using bedrails?: A Little Help needed moving to and from a bed to a chair (including a wheelchair)?: Total Help needed standing up from a chair using your arms (e.g., wheelchair or bedside chair)?: A Little Help needed to walk in hospital room?: Total Help needed climbing 3-5 steps with a railing? : Total 6 Click Score: 12    End of Session Equipment Utilized During Treatment: Gait belt Activity Tolerance: Patient tolerated treatment well;Other (comment) (limited by cognition) Patient left: in chair;with call bell/phone within reach;with chair alarm set Nurse Communication: Mobility status PT Visit Diagnosis: Other abnormalities of gait and mobility (R26.89);Muscle weakness (generalized) (M62.81);Pain     Time: [id number] PT Time Calculation (min) (ACUTE ONLY): 16 min  Charges:  $Therapeutic Activity: 8-22 mins                      Arby Barrette, PT Pager [phone]    Rexanne Mano 05/03/2021, 11:47 AM

## 2021-05-04 ENCOUNTER — Other Ambulatory Visit (HOSPITAL_COMMUNITY): Payer: Self-pay

## 2021-05-04 ENCOUNTER — Encounter (HOSPITAL_COMMUNITY)
Admission: EM | Disposition: A | Discharge status: Home Health | Payer: Self-pay | Source: Home / Self Care | Attending: Internal Medicine

## 2021-05-04 ENCOUNTER — Inpatient Hospital Stay (HOSPITAL_COMMUNITY): Payer: PPO

## 2021-05-04 DIAGNOSIS — F0390 Unspecified dementia without behavioral disturbance: Secondary | ICD-10-CM | POA: Diagnosis not present

## 2021-05-04 DIAGNOSIS — E440 Moderate protein-calorie malnutrition: Secondary | ICD-10-CM | POA: Diagnosis not present

## 2021-05-04 DIAGNOSIS — I871 Compression of vein: Secondary | ICD-10-CM | POA: Diagnosis not present

## 2021-05-04 DIAGNOSIS — I82452 Acute embolism and thrombosis of left peroneal vein: Secondary | ICD-10-CM | POA: Diagnosis not present

## 2021-05-04 DIAGNOSIS — I82442 Acute embolism and thrombosis of left tibial vein: Secondary | ICD-10-CM | POA: Diagnosis not present

## 2021-05-04 DIAGNOSIS — I82432 Acute embolism and thrombosis of left popliteal vein: Secondary | ICD-10-CM | POA: Diagnosis not present

## 2021-05-04 DIAGNOSIS — R112 Nausea with vomiting, unspecified: Secondary | ICD-10-CM | POA: Diagnosis not present

## 2021-05-04 DIAGNOSIS — N179 Acute kidney failure, unspecified: Secondary | ICD-10-CM | POA: Diagnosis not present

## 2021-05-04 DIAGNOSIS — I82412 Acute embolism and thrombosis of left femoral vein: Secondary | ICD-10-CM | POA: Diagnosis not present

## 2021-05-04 HISTORY — PX: PERIPHERAL VASCULAR THROMBECTOMY: CATH118306

## 2021-05-04 LAB — CBC
HCT: 33.6 % — ABNORMAL LOW (ref 36.0–46.0)
Hemoglobin: 11.1 g/dL — ABNORMAL LOW (ref 12.0–15.0)
MCH: 30.7 pg (ref 26.0–34.0)
MCHC: 33 g/dL (ref 30.0–36.0)
MCV: 92.8 fL (ref 80.0–100.0)
Platelets: 347 10*3/uL (ref 150–400)
RBC: 3.62 MIL/uL — ABNORMAL LOW (ref 3.87–5.11)
RDW: 13.7 % (ref 11.5–15.5)
WBC: 9.6 10*3/uL (ref 4.0–10.5)
nRBC: 0 % (ref 0.0–0.2)

## 2021-05-04 LAB — BASIC METABOLIC PANEL
Anion gap: 7 (ref 5–15)
BUN: 10 mg/dL (ref 8–23)
CO2: 27 mmol/L (ref 22–32)
Calcium: 9.5 mg/dL (ref 8.9–10.3)
Chloride: 100 mmol/L (ref 98–111)
Creatinine, Ser: 0.71 mg/dL (ref 0.44–1.00)
GFR, Estimated: >60 mL/min (ref >60)
Glucose, Bld: 86 mg/dL (ref 70–99)
Potassium: 3.4 mmol/L — ABNORMAL LOW (ref 3.5–5.1)
Sodium: 134 mmol/L — ABNORMAL LOW (ref 135–145)

## 2021-05-04 LAB — HEPARIN LEVEL (UNFRACTIONATED): Heparin Unfractionated: 0.61 IU/mL (ref 0.30–0.70)

## 2021-05-04 SURGERY — PERIPHERAL VASCULAR THROMBECTOMY
Anesthesia: LOCAL

## 2021-05-04 MED ORDER — HEPARIN SODIUM (PORCINE) 1000 UNIT/ML IJ SOLN
INTRAMUSCULAR | Status: DC | PRN
  Administered 2021-05-04: 5000 [IU] via INTRAVENOUS

## 2021-05-04 MED ORDER — HYDRALAZINE HCL 20 MG/ML IJ SOLN
5.0000 mg | INTRAMUSCULAR | Status: DC | PRN
Start: 2021-05-04 — End: 2021-05-12

## 2021-05-04 MED ORDER — ONDANSETRON HCL 4 MG/2ML IJ SOLN: 4.0000 mg | Freq: Four times a day (QID) | INTRAMUSCULAR | Status: DC | PRN

## 2021-05-04 MED ORDER — IODIXANOL 320 MG/ML IV SOLN
INTRAVENOUS | Status: DC | PRN
  Administered 2021-05-04: 20 mL via INTRAVENOUS

## 2021-05-04 MED ORDER — HEPARIN (PORCINE) IN NACL 1000-0.9 UT/500ML-% IV SOLN
INTRAVENOUS | Status: AC
  Filled 2021-05-04: qty 500

## 2021-05-04 MED ORDER — LABETALOL HCL 5 MG/ML IV SOLN: 10.0000 mg | INTRAVENOUS | Status: DC | PRN

## 2021-05-04 MED ORDER — POTASSIUM CHLORIDE 10 MEQ/100ML IV SOLN
10.0000 meq | INTRAVENOUS | Status: AC
Start: 2021-05-04 — End: 2021-05-04
  Administered 2021-05-04 (×2): 10 meq via INTRAVENOUS
  Filled 2021-05-04 (×2): qty 100

## 2021-05-04 MED ORDER — LIDOCAINE HCL (PF) 1 % IJ SOLN
INTRAMUSCULAR | Status: DC | PRN
  Administered 2021-05-04: 5 mL via INTRADERMAL

## 2021-05-04 MED ORDER — SODIUM CHLORIDE 0.9 % IV SOLN: INTRAVENOUS | Status: DC

## 2021-05-04 MED ORDER — LIDOCAINE HCL (PF) 1 % IJ SOLN
INTRAMUSCULAR | Status: AC
  Filled 2021-05-04: qty 30

## 2021-05-04 MED ORDER — SODIUM CHLORIDE 0.9 % WEIGHT BASED INFUSION
1.0000 mL/kg/h | INTRAVENOUS | Status: AC
  Administered 2021-05-04: 1 mL/kg/h via INTRAVENOUS

## 2021-05-04 MED ORDER — ACETAMINOPHEN 325 MG PO TABS: 650.0000 mg | ORAL_TABLET | ORAL | Status: DC | PRN

## 2021-05-04 MED ORDER — HEPARIN (PORCINE) IN NACL 1000-0.9 UT/500ML-% IV SOLN
INTRAVENOUS | Status: DC | PRN
  Administered 2021-05-04: 500 mL

## 2021-05-04 MED ORDER — SODIUM CHLORIDE 0.9% FLUSH: 3.0000 mL | INTRAVENOUS | Status: DC | PRN

## 2021-05-04 MED ORDER — SODIUM CHLORIDE 0.9 % IV SOLN: 250.0000 mL | INTRAVENOUS | Status: DC | PRN

## 2021-05-04 MED ORDER — FENTANYL CITRATE (PF) 100 MCG/2ML IJ SOLN
INTRAMUSCULAR | Status: DC | PRN
  Administered 2021-05-04 (×3): 25 ug via INTRAVENOUS

## 2021-05-04 MED ORDER — SODIUM CHLORIDE 0.9% FLUSH
3.0000 mL | Freq: Two times a day (BID) | INTRAVENOUS | Status: DC
  Administered 2021-05-05 – 2021-05-08 (×7): 3 mL via INTRAVENOUS

## 2021-05-04 MED ORDER — HEPARIN SODIUM (PORCINE) 1000 UNIT/ML IJ SOLN
INTRAMUSCULAR | Status: AC
  Filled 2021-05-04: qty 1

## 2021-05-04 MED ORDER — FENTANYL CITRATE (PF) 100 MCG/2ML IJ SOLN
INTRAMUSCULAR | Status: AC
  Filled 2021-05-04: qty 2

## 2021-05-04 SURGICAL SUPPLY — 19 items
BAG SNAP BAND KOVER 36X36 (MISCELLANEOUS) ×1 IMPLANT
BALLN MUSTANG 12X80X75 (BALLOONS) ×2
BALLOON MUSTANG 12X80X75 (BALLOONS) IMPLANT
CATH ANGIO 5F BER2 100CM (CATHETERS) ×1 IMPLANT
CATH CLOT TRIEVER BOLD (CATHETERS) ×1 IMPLANT
CATH INFINITI VERT 5FR 125CM (CATHETERS) ×1 IMPLANT
CATH VISIONS PV .035 IVUS (CATHETERS) ×1 IMPLANT
COVER DOME SNAP 22 D (MISCELLANEOUS) ×1 IMPLANT
KIT ENCORE 26 ADVANTAGE (KITS) ×1 IMPLANT
KIT MICROPUNCTURE NIT STIFF (SHEATH) ×1 IMPLANT
PROTECTION STATION PRESSURIZED (MISCELLANEOUS) ×2
SHEATH CLOT RETRIEVER (SHEATH) ×1 IMPLANT
SHEATH PINNACLE 8F 10CM (SHEATH) ×1 IMPLANT
SHEATH PROBE COVER 6X72 (BAG) ×1 IMPLANT
STATION PROTECTION PRESSURIZED (MISCELLANEOUS) IMPLANT
TRAY PV CATH (CUSTOM PROCEDURE TRAY) ×1 IMPLANT
WIRE AMPLATZ SS-J .035X260CM (WIRE) ×1 IMPLANT
WIRE BENTSON .035X145CM (WIRE) ×2 IMPLANT
WIRE ROSEN-J .035X260CM (WIRE) ×1 IMPLANT

## 2021-05-04 NOTE — Progress Notes (Addendum)
PROGRESS NOTE    Wendy Watts  IDP:[id number] DOB: [birth year] DOA: 04/26/2021 PCP: [name], MD    Brief Narrative:  75 y.o. female with history of advanced dementia who was recently admitted for right hip fracture discharged to rehab on March 30, 2021-brought to the ED on 9/12 for significant LLE swelling along with nausea/vomiting-patient was found to have extensive LLE DVT and AKI.  She was subsequently admitted to the hospitalist service-see below for further details.  Subjective:  Pleasantly confused-asking for ice.  Objective: Vitals: Blood pressure (!) 105/58, pulse 71, temperature (!) 97.4 F (36.3 C), temperature source Oral, resp. rate 17, height 5\' 5"  (1.651 m), weight 64.6 kg, SpO2 98 %.   Exam: Gen Exam: Recently confused-not in any distress. HEENT:atraumatic, normocephalic Chest: B/L clear to auscultation anteriorly CVS:S1S2 regular Abdomen:soft non tender, non distended Extremities: Continues to have unchanged left leg swelling. Neurology: Non focal Skin: no rash    Pertinent labs/radiology: Hb: 11.1 Creatinine: 0.71 K: 3.4  Doppler on 9/13: DVT involving left CFV, SF junction, left femoral vein, left proximal profunda vein, left popliteal vein, left posterior tibial vein, left peroneal vein, left gastrocnemius vein  Assessment & Plan: AKI: Hemodynamically mediated-due to nausea/vomiting-renal failure has resolved-creatinine back to baseline.    SIRS: Noninfectious etiology-likely from swelling/inflammation from extensive LLE DVT.  UA/CXR/blood cultures remain negative.  All antimicrobial therapy discontinued on 9/14.  Leukocytosis has normalized.  Nausea/vomiting: Resolved-could have had a transient viral syndrome.  CT abdomen negative for any acute abnormalities.  HIDA scan was negative for cholecystitis.  Tolerating advancement in diet.  Hypokalemia: Replete and recheck.  Extensive LLE DVT: Remains on heparin infusion-vascular surgery planning  mechanical thrombectomy 9/20.Suspect DVT provoked by recent surgery.    Constipation: Significant stool burden seen on CT-given Dulcolax suppository with significant results-continue MiraLAX/senna.  Recent right hip fracture-s/p ORIF on 8/14: Per last orthopedic note on 8/15-nonweightbearing for right lower extremity (but okay for touchdown weightbearing)  Dementia: Pleasantly confused-continue Aricept/Namenda/BuSpar.  Oral thrush: Start nystatin x7 days.  Vitamin B12 deficiency: Started supplementation on 9/18.  Failure to thrive syndrome: Per patient's daughter-ever since she had recent hip fracture-her appetite has been poor-and has been failing to thrive syndrome.  Per nursing staff-continues to have poor oral intake-have started Remeron.  Nutrition Status: Nutrition Problem: Moderate Malnutrition Etiology: chronic illness (dementia) Signs/Symptoms: mild fat depletion, moderate fat depletion, mild muscle depletion, moderate muscle depletion, energy intake < 75% for > or equal to 1 month Interventions: Boost Breeze, MVI, Prostat     DVT prophylaxis: Heparin gtt Code Status: Full Family Communication: Daughter-[phone]-updated over the phone on 9/17  Status is: Inpatient  Remains inpatient appropriate because:Inpatient level of care appropriate due to severity of illness  Dispo: The patient is from: Home              Anticipated d/c is to:  Unclear at this time              Patient currently is not medically stable to d/c.   Difficult to place patient No    Consultants:  Vascular Surgery  Procedures:    Antimicrobials: Anti-infectives (From admission, onward)    Start     Dose/Rate Route Frequency Ordered Stop   04/27/21 0000  ceFEPIme (MAXIPIME) 2 g in sodium chloride 0.9 % 100 mL IVPB  Status:  Discontinued        2 g 200 mL/hr over 30 Minutes Intravenous Every 24 hours 04/26/21 2128 04/28/21 1626  04/26/21 2200  metroNIDAZOLE (FLAGYL) IVPB 500 mg   Status:  Discontinued        500 mg 100 mL/hr over 60 Minutes Intravenous Every 12 hours 04/26/21 2124 04/28/21 1626   04/26/21 2130  ceFEPIme (MAXIPIME) 2 g in sodium chloride 0.9 % 100 mL IVPB  Status:  Discontinued        2 g 200 mL/hr over 30 Minutes Intravenous  Once 04/26/21 2124 04/28/21 1626       Filed Weights   04/27/21 2146 05/01/21 0400 05/03/21 0422  Weight: 69.8 kg 65.1 kg 64.6 kg    Data Reviewed: I have personally reviewed following labs and imaging studies  CBC: Recent Labs  Lab 04/29/21 0048 04/30/21 0127 05/01/21 0038 05/03/21 1019 05/04/21 0320  WBC 14.4* 14.4* 12.4* 11.6* 9.6  HGB 11.8* 10.9* 11.0* 12.8 11.1*  HCT 36.0 33.2* 34.0* 38.9 33.6*  MCV 94.0 93.8 94.2 93.5 92.8  PLT 224 265 292 255 829    Basic Metabolic Panel: Recent Labs  Lab 04/28/21 0035 04/29/21 0048 04/30/21 0127 05/01/21 0038 05/04/21 0320  NA 135 138 140 139 134*  K 4.0 3.8 3.8 3.7 3.4*  CL 101 103 107 106 100  CO2 22 24 24 24 27   GLUCOSE 99 87 102* 87 86  BUN 37* 38* 31* 28* 10  CREATININE 2.09* 1.41* 1.17* 1.05* 0.71  CALCIUM 9.6 9.5 10.0 10.1 9.5    GFR: Estimated Creatinine Clearance: 54.7 mL/min (by C-G formula based on SCr of 0.71 mg/dL). Liver Function Tests: Recent Labs  Lab 04/28/21 0035  AST 16  ALT 17  ALKPHOS 109  BILITOT 1.1  PROT 5.5*  ALBUMIN 2.5*    No results for input(s): LIPASE, AMYLASE in the last 168 hours.  No results for input(s): AMMONIA in the last 168 hours. Coagulation Profile: No results for input(s): INR, PROTIME in the last 168 hours. Cardiac Enzymes: No results for input(s): CKTOTAL, CKMB, CKMBINDEX, TROPONINI in the last 168 hours. BNP (last 3 results) No results for input(s): PROBNP in the last 8760 hours. HbA1C: No results for input(s): HGBA1C in the last 72 hours. CBG: No results for input(s): GLUCAP in the last 168 hours. Lipid Profile: No results for input(s): CHOL, HDL, LDLCALC, TRIG, CHOLHDL, LDLDIRECT in the  last 72 hours. Thyroid Function Tests: No results for input(s): TSH, T4TOTAL, FREET4, T3FREE, THYROIDAB in the last 72 hours. Anemia Panel: Recent Labs    05/02/21 0301  VITAMINB12 186  FOLATE 10.0  FERRITIN 446*  TIBC 150*  IRON 49  RETICCTPCT 1.5    Sepsis Labs: No results for input(s): PROCALCITON, LATICACIDVEN in the last 168 hours.   Recent Results (from the past 240 hour(s))  Resp Panel by RT-PCR (Flu A&B, Covid) Nasopharyngeal Swab     Status: None   Collection Time: 04/26/21  6:58 PM   Specimen: Nasopharyngeal Swab; Nasopharyngeal(NP) swabs in vial transport medium  Result Value Ref Range Status   SARS Coronavirus 2 by RT PCR NEGATIVE NEGATIVE Final    Comment: (NOTE) SARS-CoV-2 target nucleic acids are NOT DETECTED.  The SARS-CoV-2 RNA is generally detectable in upper respiratory specimens during the acute phase of infection. The lowest concentration of SARS-CoV-2 viral copies this assay can detect is 138 copies/mL. A negative result does not preclude SARS-Cov-2 infection and should not be used as the sole basis for treatment or other patient management decisions. A negative result may occur with  improper specimen collection/handling, submission of specimen other than nasopharyngeal swab, presence  of viral mutation(s) within the areas targeted by this assay, and inadequate number of viral copies(<138 copies/mL). A negative result must be combined with clinical observations, patient history, and epidemiological information. The expected result is Negative.  Fact Sheet for Patients:  EntrepreneurPulse.com.au  Fact Sheet for Healthcare Providers:  IncredibleEmployment.be  This test is no t yet approved or cleared by the [place] FDA and  has been authorized for detection and/or diagnosis of SARS-CoV-2 by FDA under an Emergency Use Authorization (EUA). This EUA will remain  in effect (meaning this test can be used) for  the duration of the COVID-19 declaration under Section 564(b)(1) of the Act, 21 U.S.C.section 360bbb-3(b)(1), unless the authorization is terminated  or revoked sooner.       Influenza A by PCR NEGATIVE NEGATIVE Final   Influenza B by PCR NEGATIVE NEGATIVE Final    Comment: (NOTE) The Xpert Xpress SARS-CoV-2/FLU/RSV plus assay is intended as an aid in the diagnosis of influenza from Nasopharyngeal swab specimens and should not be used as a sole basis for treatment. Nasal washings and aspirates are unacceptable for Xpert Xpress SARS-CoV-2/FLU/RSV testing.  Fact Sheet for Patients: EntrepreneurPulse.com.au  Fact Sheet for Healthcare Providers: IncredibleEmployment.be  This test is not yet approved or cleared by the [place] FDA and has been authorized for detection and/or diagnosis of SARS-CoV-2 by FDA under an Emergency Use Authorization (EUA). This EUA will remain in effect (meaning this test can be used) for the duration of the COVID-19 declaration under Section 564(b)(1) of the Act, 21 U.S.C. section 360bbb-3(b)(1), unless the authorization is terminated or revoked.  Performed at [facility], [place] [address], [place], [place] [address]   Blood culture (routine x 2)     Status: None   Collection Time: 04/26/21 10:35 PM   Specimen: BLOOD RIGHT FOREARM  Result Value Ref Range Status   Specimen Description BLOOD RIGHT FOREARM  Final   Special Requests   Final    BOTTLES DRAWN AEROBIC AND ANAEROBIC Blood Culture results may not be optimal due to an inadequate volume of blood received in culture bottles   Culture   Final    NO GROWTH 5 DAYS Performed at [facility], [place] [address], [place], [place] [address]    Report Status 05/01/2021 FINAL  Final      Radiology Studies: DG Swallowing Func-Speech Pathology  Result Date: 05/03/2021 Table formatting from the original result was not included. Objective Swallowing  Evaluation: Type of Study: MBS-Modified Barium Swallow Study  Patient Details Name: Wendy Watts MRN: [id number] Date of Birth: Apr 07, 1946 Today's Date: 05/03/2021 Time: SLP Start Time (ACUTE ONLY): 1330 -SLP Stop Time (ACUTE ONLY): 1400 SLP Time Calculation (min) (ACUTE ONLY): 30 min Past Medical History: Past Medical History: Diagnosis Date  Anxiety   Dementia ([place])   High cholesterol   Vertigo  Past Surgical History: Past Surgical History: Procedure Laterality Date  HYSTERECTOMY    INTRAMEDULLARY (IM) NAIL INTERTROCHANTERIC Right 03/28/2021  Procedure: INTRAMEDULLARY (IM) NAIL INTERTROCHANTRIC;  Surgeon: Meredith Pel, MD;  Location: [facility];  Service: Orthopedics;  Laterality: Right;  TOTAL KNEE ARTHROPLASTY Right  HPI: Pt is a 75 y.o. female who was admitted secondary to nausea, vomiting and left lower extremity swelling. Pt's daughter reported to MD that pt has been having poor appetite and failure to thrive syndrome since recent surgery. CXR 9/12: Very mild left basilar atelectasis. PMH: advanced dementia, recent right hip fracture-s/p ORIF on 8/14  Subjective: alert, difficult to  convince to participate Assessment / Plan / Recommendation CHL IP CLINICAL IMPRESSIONS 05/03/2021 Clinical Impression Limited MBS completed due to pt's reluctance to participate; therefore minimal consistencies and quantities could be assessed.  She demonstrated functional mastication of a small portion of cracker and 1/2 tspn of pureed barium with normal onset of the swallow and no obvious deficits.  She allowed sips of thin barium, demonstrating high penetration x1 (Penetration/aspiration score of 2, considered WFL) and one incident of penetration to the vocal folds when throwing her head into extension. There was no aspiration.  Pt was noted to cough throughout the exam - with both solids and liquids- which was not correlated with swallow dysfunction/airway invasion.  Notable on one occasion was backflow of thin liquids into the  cervical esophagus - no hx of GERD or esophageal deficits in chart.  Recommend continuing with her current diet given no evidence of aspiration.  Will f/u x1 for education. SLP Visit Diagnosis Dysphagia, unspecified (R13.10) Attention and concentration deficit following -- Frontal lobe and executive function deficit following -- Impact on safety and function --   CHL IP TREATMENT RECOMMENDATION 05/03/2021 Treatment Recommendations Therapy as outlined in treatment plan below   Prognosis 05/03/2021 Prognosis for Safe Diet Advancement Fair Barriers to Reach Goals Cognitive deficits;Motivation Barriers/Prognosis Comment -- CHL IP DIET RECOMMENDATION 05/03/2021 SLP Diet Recommendations Dysphagia 3 (Mech soft) solids;Thin liquid Liquid Administration via Cup;Straw Medication Administration Crushed with puree Compensations Minimize environmental distractions Postural Changes --   CHL IP OTHER RECOMMENDATIONS 05/03/2021 Recommended Consults -- Oral Care Recommendations Oral care BID Other Recommendations --   CHL IP FOLLOW UP RECOMMENDATIONS 05/03/2021 Follow up Recommendations 24 hour supervision/assistance   CHL IP FREQUENCY AND DURATION 05/03/2021 Speech Therapy Frequency (ACUTE ONLY) min 1 x/week Treatment Duration 1 week      CHL IP ORAL PHASE 05/03/2021 Oral Phase WFL Oral - Pudding Teaspoon -- Oral - Pudding Cup -- Oral - Honey Teaspoon -- Oral - Honey Cup -- Oral - Nectar Teaspoon -- Oral - Nectar Cup -- Oral - Nectar Straw -- Oral - Thin Teaspoon -- Oral - Thin Cup -- Oral - Thin Straw -- Oral - Puree -- Oral - Mech Soft -- Oral - Regular WFL Oral - Multi-Consistency -- Oral - Pill -- Oral Phase - Comment --  CHL IP PHARYNGEAL PHASE 05/03/2021 Pharyngeal Phase WFL Pharyngeal- Pudding Teaspoon -- Pharyngeal -- Pharyngeal- Pudding Cup -- Pharyngeal -- Pharyngeal- Honey Teaspoon -- Pharyngeal -- Pharyngeal- Honey Cup -- Pharyngeal -- Pharyngeal- Nectar Teaspoon -- Pharyngeal -- Pharyngeal- Nectar Cup -- Pharyngeal --  Pharyngeal- Nectar Straw -- Pharyngeal -- Pharyngeal- Thin Teaspoon -- Pharyngeal -- Pharyngeal- Thin Cup -- Pharyngeal -- Pharyngeal- Thin Straw -- Pharyngeal -- Pharyngeal- Puree -- Pharyngeal -- Pharyngeal- Mechanical Soft -- Pharyngeal -- Pharyngeal- Regular -- Pharyngeal -- Pharyngeal- Multi-consistency -- Pharyngeal -- Pharyngeal- Pill -- Pharyngeal -- Pharyngeal Comment --  No flowsheet data found. [name] 05/03/2021, 2:48 PM               Scheduled Meds:  (feeding supplement) PROSource Plus  30 mL Oral BID BM   busPIRone  10 mg Oral BID   donepezil  10 mg Oral QHS   feeding supplement  1 Container Oral TID BM   memantine  10 mg Oral BID   mirtazapine  7.5 mg Oral QHS   multivitamin with minerals  1 tablet Oral Daily   nystatin  5 mL Oral QID   polyethylene glycol  17 g Oral BID  senna  2 tablet Oral QHS   Continuous Infusions:  sodium chloride 100 mL/hr at 05/04/21 6725   heparin 900 Units/hr (05/04/21 0600)   promethazine (PHENERGAN) injection (IM or IVPB) 6.25 mg (04/28/21 0045)     LOS: 8 days   Oren Binet, MD [facility] Pager On Amion  If 7PM-7AM, please contact night-coverage 05/04/2021, 12:03 PM

## 2021-05-04 NOTE — Progress Notes (Addendum)
[place] for heparin Indication: acute DVT  Allergies  Allergen Reactions   Allegra [Fexofenadine Hcl] Other (See Comments)    "takes me to another world"   Ativan [Lorazepam] Other (See Comments)    Hallucinations   Azithromycin Nausea And Vomiting   Codeine Hives   Doxycycline Hyclate Nausea Only   Penicillins Hives    Has patient had a PCN reaction causing immediate rash, facial/tongue/throat swelling, SOB or lightheadedness with hypotension: no Has patient had a PCN reaction causing severe rash involving mucus membranes or skin necrosis: no Has patient had a PCN reaction that required hospitalization: no Has patient had a PCN reaction occurring within the last 10 years: no If all of the above answers are "NO", then may proceed with Cephalosporin use.    Trazodone And Nefazodone Nausea Only   Zyrtec [Cetirizine] Other (See Comments)    "takes me to another world"    Patient Measurements: Height: 5\' 5"  (165.1 cm) Weight: 64.6 kg (142 lb 6.7 oz) IBW/kg (Calculated) : 57 Heparin Dosing Weight: 65 kg  Vital Signs: Temp: 97.4 F (36.3 C) (09/20 0750) Temp Source: Oral (09/20 0750) BP: 105/58 (09/20 0750) Pulse Rate: 71 (09/20 0750)  Labs: Recent Labs    05/02/21 0301 05/03/21 0235 05/03/21 1019 05/04/21 0320  HGB  --   --  12.8 11.1*  HCT  --   --  38.9 33.6*  PLT  --   --  255 347  HEPARINUNFRC 0.59 0.43  --  0.61  CREATININE  --   --   --  0.71    Estimated Creatinine Clearance: Wendy.7 mL/min (by C-G formula based on SCr of 0.71 mg/dL).  Assessment: Wendy Watts who recently had a R hip fracture s/p surgery here with acute DVT to start IV heparin for VTE treatment (acute L CIV thrombus on ultrasound). Plan for mechanical thrombectomy on 9/20.  Heparin level 0.61, therapeutic. No s/s of bleeding noted, CBC complete for today.  Goal of Therapy:  Heparin level 0.3-0.7 units/ml Monitor platelets by anticoagulation protocol: Yes    Plan:  Continue heparin infusion at 900 units/hr F/u daily heparin level, CBC stable Monitor for s/s of bleeding F/u transition to oral agent after thrombectomy  Addendum:  Co-pay $45 for Eliquis or Xarelto, $5 for warfarin.  Wendy Watts 05/04/2021,9:13 AM

## 2021-05-04 NOTE — Progress Notes (Signed)
Inpatient Rehabilitation Admissions Coordinator   Inpatient rehab consult received. I met with patient , spouse and daughter at bedside this morning. We discussed goals and expectations of a possible CIR admit. They prefer CIR rather than SNF or direct D/C home. I will follow up postoperatively for continued assessment to assist with dispo.  Danne Baxter, RN, MSN Rehab Admissions Coordinator [phone] 05/04/2021 1:01 PM

## 2021-05-04 NOTE — Op Note (Signed)
Patient name: Wendy Watts MRN: [id number] DOB: 07/19/1946 Sex: female  05/04/2021 Pre-operative Diagnosis: Left leg DVT Post-operative diagnosis:  Same Surgeon:  [name] Procedure Performed:  1.  Ultrasound-guided access, left popliteal vein  2.  IVUS (intravascular ultrasound, left popliteal, femoral, common femoral, external iliac, and common iliac veins and inferior vena cava  3.  Mechanical venous thrombectomy using the INARI device of the inferior vena cava, left common iliac, external iliac, common femoral, femoral, and popliteal veins  4.  Balloon venoplasty of the inferior vena cava, left common iliac, external iliac, common femoral, femoral, popliteal vein  5.  Venography of the left popliteal, femoral, common femoral, external iliac, and common iliac veins as well as the inferior vena cava    Indications: This is a 75 year old female with recent hip surgery who presented with a acute left leg DVT.  She comes in today for intervention.  Procedure:  The patient was identified in the holding area and taken to room 8.  The patient was then placed supine on the table and prepped and draped in the usual sterile fashion.  A time out was called.  Ultrasound was used to evaluate the left popliteal vein which was distended and full of thrombus.  The popliteal vein was then cannulated under ultrasound guidance with a micropuncture needle.  An 018 wire was advanced without resistance followed by placement of micropuncture sheath.  I then performed a quick puff of contrast to confirm that I was in the venous system and then advanced a Bentson wire centrally.  I then placed an 8 [place] sheath.  Next using a Berenstein 2 catheter and a Bentson wire, I was able to get the wire catheter up into the internal jugular vein.  I then switched out for an Amplatz wire and performed intravascular ultrasound of the left popliteal, femoral, common femoral, external iliac, and common iliac vein as well as AP  vena cava.  This showed that there was thrombus in the infra vena cava extending up to the renal veins.  I then inserted the Inari sheath.  The patient was given an extra 5000 units of heparin.  Mechanical thrombectomy was then performed of the inferior vena cava, common iliac, external iliac, common femoral, femoral, and popliteal veins.  4 passes were performed.  Upon completion of rehab interrogated all of the veins with IVUS, and there did not appear to be any residual thrombus.  I then performed balloon venoplasty of the inferior vena cava, left common iliac, external iliac, common femoral, femoral, popliteal vein using a 12 x 80 Mustang balloon.  I then performed venography from the sheath up into the abdomen.  This showed no obvious filling defects and inline flow.  There did appear to be some narrowing of the common iliac vein which I have predilated with a 12 mm balloon.  I did not want to stent this given that this was acute thrombus after surgery, and because of the patient's age and dementia.  At this point is very satisfied with the results.  The sheath was removed and the site closed with a 3-0 nylon and rubber bolster.  Findings: Thrombosed extended up in the inferior vena cava to the renal veins.  Successful mechanical thrombectomy and balloon venoplasty    Impression:  #1  Successful mechanical venous thrombectomy of the infra vena cava, left common iliac, external iliac, common femoral, femoral, and popliteal veins using the INARI device.  I also performed balloon venoplasty using a  12 mm balloon.  #2  Reestablishment of inline flow through the iliofemoral venous system after thrombectomy and venoplasty  #3  The patient will need to be on anticoagulation for 3 to 6 months as this was a provoked DVT.  Her procedural dressings will be removed tomorrow or the next day and 20-30 thigh-high compression stockings will be placed   V. [name], M.D., [facility]  [place] Office: [phone] Pager:  [phone]

## 2021-05-04 NOTE — TOC Benefit Eligibility Note (Signed)
Patient Teacher, English as a foreign language completed.    The patient is currently admitted and upon discharge could be taking Eliquis 5 mg.  The current 30 day co-pay is, $45.00.   The patient is currently admitted and upon discharge could be taking Xarelto 20 mg.  The current 30 day co-pay is, $45.00.   The patient is currently admitted and upon discharge could be taking Warfarin 5 mg.  The current 30 day co-pay is, $5.00.   The patient is insured through [employer] [place] Patient Advocate Specialist [place] Team Direct Number: [phone]  Fax: [phone]

## 2021-05-04 NOTE — Interval H&P Note (Signed)
History and Physical Interval Note:  05/04/2021 12:05 PM  Wendy Watts  has presented today for surgery, with the diagnosis of dvt.  The various methods of treatment have been discussed with the patient and family. After consideration of risks, benefits and other options for treatment, the patient has consented to  Procedure(s): PERIPHERAL VASCULAR THROMBECTOMY - VENUS (N/A) as a surgical intervention.  The patient's history has been reviewed, patient examined, no change in status, stable for surgery.  I have reviewed the patient's chart and labs.  Questions were answered to the patient's satisfaction.     [name]

## 2021-05-05 ENCOUNTER — Encounter (HOSPITAL_COMMUNITY): Payer: Self-pay | Admitting: Surgery

## 2021-05-05 DIAGNOSIS — N179 Acute kidney failure, unspecified: Secondary | ICD-10-CM | POA: Diagnosis not present

## 2021-05-05 DIAGNOSIS — I82432 Acute embolism and thrombosis of left popliteal vein: Secondary | ICD-10-CM | POA: Diagnosis not present

## 2021-05-05 DIAGNOSIS — E440 Moderate protein-calorie malnutrition: Secondary | ICD-10-CM | POA: Diagnosis not present

## 2021-05-05 DIAGNOSIS — F0390 Unspecified dementia without behavioral disturbance: Secondary | ICD-10-CM | POA: Diagnosis not present

## 2021-05-05 DIAGNOSIS — I82452 Acute embolism and thrombosis of left peroneal vein: Secondary | ICD-10-CM | POA: Diagnosis not present

## 2021-05-05 DIAGNOSIS — R112 Nausea with vomiting, unspecified: Secondary | ICD-10-CM | POA: Diagnosis not present

## 2021-05-05 DIAGNOSIS — Z7901 Long term (current) use of anticoagulants: Secondary | ICD-10-CM

## 2021-05-05 DIAGNOSIS — Z9862 Peripheral vascular angioplasty status: Secondary | ICD-10-CM

## 2021-05-05 DIAGNOSIS — I82412 Acute embolism and thrombosis of left femoral vein: Secondary | ICD-10-CM | POA: Diagnosis not present

## 2021-05-05 DIAGNOSIS — I82442 Acute embolism and thrombosis of left tibial vein: Secondary | ICD-10-CM | POA: Diagnosis not present

## 2021-05-05 LAB — CBC
HCT: 31.8 % — ABNORMAL LOW (ref 36.0–46.0)
Hemoglobin: 10.4 g/dL — ABNORMAL LOW (ref 12.0–15.0)
MCH: 30.5 pg (ref 26.0–34.0)
MCHC: 32.7 g/dL (ref 30.0–36.0)
MCV: 93.3 fL (ref 80.0–100.0)
Platelets: 279 10*3/uL (ref 150–400)
RBC: 3.41 MIL/uL — ABNORMAL LOW (ref 3.87–5.11)
RDW: 13.9 % (ref 11.5–15.5)
WBC: 9.3 10*3/uL (ref 4.0–10.5)
nRBC: 0 % (ref 0.0–0.2)

## 2021-05-05 LAB — BASIC METABOLIC PANEL
Anion gap: 9 (ref 5–15)
BUN: 7 mg/dL — ABNORMAL LOW (ref 8–23)
CO2: 22 mmol/L (ref 22–32)
Calcium: 8.9 mg/dL (ref 8.9–10.3)
Chloride: 106 mmol/L (ref 98–111)
Creatinine, Ser: 0.83 mg/dL (ref 0.44–1.00)
GFR, Estimated: >60 mL/min (ref >60)
Glucose, Bld: 78 mg/dL (ref 70–99)
Potassium: 3.5 mmol/L (ref 3.5–5.1)
Sodium: 137 mmol/L (ref 135–145)

## 2021-05-05 LAB — HEPARIN LEVEL (UNFRACTIONATED)
Heparin Unfractionated: <0.1 IU/mL — ABNORMAL LOW (ref 0.30–0.70)
Heparin Unfractionated: 0.8 IU/mL — ABNORMAL HIGH (ref 0.30–0.70)

## 2021-05-05 LAB — POCT ACTIVATED CLOTTING TIME: Activated Clotting Time: 155 seconds

## 2021-05-05 LAB — MAGNESIUM: Magnesium: 1.5 mg/dL — ABNORMAL LOW (ref 1.7–2.4)

## 2021-05-05 MED ORDER — MAGNESIUM SULFATE 4 GM/100ML IV SOLN
4.0000 g | Freq: Once | INTRAVENOUS | Status: AC
  Administered 2021-05-05: 4 g via INTRAVENOUS
  Filled 2021-05-05: qty 100

## 2021-05-05 MED ORDER — POTASSIUM CHLORIDE 20 MEQ PO PACK
40.0000 meq | PACK | Freq: Once | ORAL | Status: AC
  Administered 2021-05-05: 40 meq via ORAL
  Filled 2021-05-05: qty 2

## 2021-05-05 NOTE — Progress Notes (Signed)
Speech Language Pathology Treatment: Dysphagia  Patient Details Name: Wendy Watts MRN: [id number] DOB: 1946/05/29 Today's Date: 05/05/2021 Time: 1829-9371 SLP Time Calculation (min) (ACUTE ONLY): 11 min  Assessment / Plan / Recommendation Clinical Impression  Pt was seen for dysphagia treatment. Pt's RN reported that the pt continues to demonstrate coughing when she does not like foods, but tolerated meds without overt s/sx of aspiration. Pt was seen with breakfast tray which included sausage, [place] toast, applesauce, and thin liquids. As has been seen during prior sessions, but was noted during the MBS to be unrelated to swallowing, pt demonstrated intermittent coughing during the session. Mastication was functional for solids, but pt spat out boluses of sausage, and [place] toast stating that she did not want them. No symptoms of pharyngeal residue were noted with purees and oral clearance was adequate. SLP suspects that diet modifications will further reduce p.o. intake, and this does not appear necessary from an oropharyngeal standpoint. Pt's current diet of dysphagia 3 solids and thin liquids will be continued. Supervision may be needed to encourage p.o. intake and reduce impact of cognition on swallowing. Further skilled SLP services are not clinically indicated at this time.   HPI HPI: Pt is a 75 y.o. female who was admitted secondary to nausea, vomiting and left lower extremity swelling. Pt's daughter reported to MD that pt has been having poor appetite and failure to thrive syndrome since recent surgery. CXR 9/12: Very mild left basilar atelectasis. PMH: advanced dementia, recent right hip fracture-s/p ORIF on 8/14      SLP Plan  All goals met;Discharge SLP treatment due to (comment)      Recommendations for follow up therapy are one component of a multi-disciplinary discharge planning process, led by the attending physician.  Recommendations may be updated based on patient status,  additional functional criteria and insurance authorization.    Recommendations  Diet recommendations: Dysphagia 3 (mechanical soft) Liquids provided via: Cup;Straw Medication Administration: Crushed with puree Supervision: Staff to assist with self feeding Compensations: Slow rate;Small sips/bites                Oral Care Recommendations: Oral care BID Follow up Recommendations: Other (comment) (tba) SLP Visit Diagnosis: Dysphagia, unspecified (R13.10) Plan: All goals met;Discharge SLP treatment due to (comment)       Amilia Vandenbrink I. Hardin Negus, [place], [place] Office number [phone] Pager [place]  05/05/2021, 10:09 AM

## 2021-05-05 NOTE — Progress Notes (Signed)
PROGRESS NOTE    Wendy Watts  EZM:[id number] DOB: 1946/04/16 DOA: 04/26/2021 PCP: [name], MD    Brief Narrative:  75 y.o. female with history of advanced dementia who was recently admitted for right hip fracture discharged to rehab on March 30, 2021-brought to the ED on 9/12 for significant LLE swelling along with nausea/vomiting-patient was found to have extensive LLE DVT and AKI.  She was subsequently admitted to the hospitalist service-see below for further details.  Subjective:  Remains pleasantly confused-asking for ice.  Will follow simple commands.  Objective: Vitals: Blood pressure (!) 113/52, pulse 76, temperature 98 F (36.7 C), temperature source Oral, resp. rate 18, height 5\' 5"  (1.651 m), weight 64.6 kg, SpO2 99 %.   Exam: Gen Exam:Alert awake-not in any distress HEENT:atraumatic, normocephalic Chest: B/L clear to auscultation anteriorly CVS:S1S2 regular Abdomen:soft non tender, non distended Extremities: Mild LLE swelling. Neurology: Non focal Skin: no rash   Pertinent labs/radiology: Hb: 10.4 Creatinine: 0.83 Magnesium: 1.5 K: 3.5   Doppler on 9/13: DVT involving left CFV, SF junction, left femoral vein, left proximal profunda vein, left popliteal vein, left posterior tibial vein, left peroneal vein, left gastrocnemius vein  Assessment & Plan: AKI: Hemodynamically mediated-due to nausea/vomiting-renal failure has resolved-creatinine back to baseline.    SIRS: Noninfectious etiology-likely from swelling/inflammation from extensive LLE DVT.  UA/CXR/blood cultures remain negative.  All antimicrobial therapy discontinued on 9/14.  Leukocytosis has normalized.  Nausea/vomiting: Resolved-could have had a transient viral syndrome.  CT abdomen negative for any acute abnormalities.  HIDA scan was negative for cholecystitis.  Tolerating advancement in diet.  Hypokalemia: Repleted.  Extensive LLE DVT s/p thrombectomy on 9/20: Remains on IV  heparin-vascular surgery following-plans to transition to Eliquis when okay with vascular surgery.  Suspect DVT was provoked by recent hip surgery.     Constipation: Significant stool burden seen on CT-given Dulcolax suppository with significant results-continue MiraLAX/senna.  Recent right hip fracture-s/p ORIF on 8/14: Per last orthopedic note on 8/15-nonweightbearing for right lower extremity (but okay for touchdown weightbearing)  Dementia: Pleasantly confused-continue Aricept/Namenda/BuSpar.  Oral thrush: Start nystatin x7 days.  Vitamin B12 deficiency: Started supplementation on 9/18.  Failure to thrive syndrome: Per patient's daughter-ever since she had recent hip fracture-her appetite has been poor-and has been failing to thrive syndrome.  Per nursing staff-continues to have poor oral intake-have started Remeron.  Nutrition Status: Nutrition Problem: Moderate Malnutrition Etiology: chronic illness (dementia) Signs/Symptoms: mild fat depletion, moderate fat depletion, mild muscle depletion, moderate muscle depletion, energy intake < 75% for > or equal to 1 month Interventions: Boost Breeze, MVI, Prostat     DVT prophylaxis: Heparin gtt Code Status: Full Family Communication: Daughter-Shea-[phone]-updated over the phone on 9/21  Status is: Inpatient  Remains inpatient appropriate because:Inpatient level of care appropriate due to severity of illness  Dispo: The patient is from: Home              Anticipated d/c is to:  Unclear at this time              Patient currently is not medically stable to d/c.   Difficult to place patient No    Consultants:  Vascular Surgery  Procedures:     1.  Ultrasound-guided access, left popliteal vein    2.  IVUS (intravascular ultrasound, left popliteal, femoral, common femoral, external iliac, and common iliac veins and inferior vena cava            3.  Mechanical venous thrombectomy using  the [facility] device of the inferior vena cava,  left common iliac, external iliac, common femoral, femoral, and popliteal veins            4.  Balloon venoplasty of the inferior vena cava, left common iliac, external iliac, common femoral, femoral, popliteal vein            5.  Venography of the left popliteal, femoral, common femoral, external iliac, and common iliac veins as well as the inferior vena cava  Antimicrobials: Anti-infectives (From admission, onward)    Start     Dose/Rate Route Frequency Ordered Stop   04/27/21 0000  ceFEPIme (MAXIPIME) 2 g in sodium chloride 0.9 % 100 mL IVPB  Status:  Discontinued        2 g 200 mL/hr over 30 Minutes Intravenous Every 24 hours 04/26/21 2128 04/28/21 1626   04/26/21 2200  metroNIDAZOLE (FLAGYL) IVPB 500 mg  Status:  Discontinued        500 mg 100 mL/hr over 60 Minutes Intravenous Every 12 hours 04/26/21 2124 04/28/21 1626   04/26/21 2130  ceFEPIme (MAXIPIME) 2 g in sodium chloride 0.9 % 100 mL IVPB  Status:  Discontinued        2 g 200 mL/hr over 30 Minutes Intravenous  Once 04/26/21 2124 04/28/21 1626       Filed Weights   04/27/21 2146 05/01/21 0400 05/03/21 0422  Weight: 69.8 kg 65.1 kg 64.6 kg    Data Reviewed: I have personally reviewed following labs and imaging studies  CBC: Recent Labs  Lab 04/30/21 0127 05/01/21 0038 05/03/21 1019 05/04/21 0320 05/05/21 0242  WBC 14.4* 12.4* 11.6* 9.6 9.3  HGB 10.9* 11.0* 12.8 11.1* 10.4*  HCT 33.2* 34.0* 38.9 33.6* 31.8*  MCV 93.8 94.2 93.5 92.8 93.3  PLT 265 292 255 347 841    Basic Metabolic Panel: Recent Labs  Lab 04/29/21 0048 04/30/21 0127 05/01/21 0038 05/04/21 0320 05/05/21 0242  NA 138 140 139 134* 137  K 3.8 3.8 3.7 3.4* 3.5  CL 103 107 106 100 106  CO2 24 24 24 27 22   GLUCOSE 87 102* 87 86 78  BUN 38* 31* 28* 10 7*  CREATININE 1.41* 1.17* 1.05* 0.71 0.83  CALCIUM 9.5 10.0 10.1 9.5 8.9  MG  --   --   --   --  1.5*    GFR: Estimated Creatinine Clearance: 52.7 mL/min (by C-G formula based on SCr of  0.83 mg/dL). Liver Function Tests: No results for input(s): AST, ALT, ALKPHOS, BILITOT, PROT, ALBUMIN in the last 168 hours.  No results for input(s): LIPASE, AMYLASE in the last 168 hours.  No results for input(s): AMMONIA in the last 168 hours. Coagulation Profile: No results for input(s): INR, PROTIME in the last 168 hours. Cardiac Enzymes: No results for input(s): CKTOTAL, CKMB, CKMBINDEX, TROPONINI in the last 168 hours. BNP (last 3 results) No results for input(s): PROBNP in the last 8760 hours. HbA1C: No results for input(s): HGBA1C in the last 72 hours. CBG: No results for input(s): GLUCAP in the last 168 hours. Lipid Profile: No results for input(s): CHOL, HDL, LDLCALC, TRIG, CHOLHDL, LDLDIRECT in the last 72 hours. Thyroid Function Tests: No results for input(s): TSH, T4TOTAL, FREET4, T3FREE, THYROIDAB in the last 72 hours. Anemia Panel: No results for input(s): VITAMINB12, FOLATE, FERRITIN, TIBC, IRON, RETICCTPCT in the last 72 hours.  Sepsis Labs: No results for input(s): PROCALCITON, LATICACIDVEN in the last 168 hours.   Recent Results (from the past  240 hour(s))  Resp Panel by RT-PCR (Flu A&B, Covid) Nasopharyngeal Swab     Status: None   Collection Time: 04/26/21  6:58 PM   Specimen: Nasopharyngeal Swab; Nasopharyngeal(NP) swabs in vial transport medium  Result Value Ref Range Status   SARS Coronavirus 2 by RT PCR NEGATIVE NEGATIVE Final    Comment: (NOTE) SARS-CoV-2 target nucleic acids are NOT DETECTED.  The SARS-CoV-2 RNA is generally detectable in upper respiratory specimens during the acute phase of infection. The lowest concentration of SARS-CoV-2 viral copies this assay can detect is 138 copies/mL. A negative result does not preclude SARS-Cov-2 infection and should not be used as the sole basis for treatment or other patient management decisions. A negative result may occur with  improper specimen collection/handling, submission of specimen other than  nasopharyngeal swab, presence of viral mutation(s) within the areas targeted by this assay, and inadequate number of viral copies(<138 copies/mL). A negative result must be combined with clinical observations, patient history, and epidemiological information. The expected result is Negative.  Fact Sheet for Patients:  EntrepreneurPulse.com.au  Fact Sheet for Healthcare Providers:  IncredibleEmployment.be  This test is no t yet approved or cleared by the [place] FDA and  has been authorized for detection and/or diagnosis of SARS-CoV-2 by FDA under an Emergency Use Authorization (EUA). This EUA will remain  in effect (meaning this test can be used) for the duration of the COVID-19 declaration under Section 564(b)(1) of the Act, 21 U.S.C.section 360bbb-3(b)(1), unless the authorization is terminated  or revoked sooner.       Influenza A by PCR NEGATIVE NEGATIVE Final   Influenza B by PCR NEGATIVE NEGATIVE Final    Comment: (NOTE) The Xpert Xpress SARS-CoV-2/FLU/RSV plus assay is intended as an aid in the diagnosis of influenza from Nasopharyngeal swab specimens and should not be used as a sole basis for treatment. Nasal washings and aspirates are unacceptable for Xpert Xpress SARS-CoV-2/FLU/RSV testing.  Fact Sheet for Patients: EntrepreneurPulse.com.au  Fact Sheet for Healthcare Providers: IncredibleEmployment.be  This test is not yet approved or cleared by the [place] FDA and has been authorized for detection and/or diagnosis of SARS-CoV-2 by FDA under an Emergency Use Authorization (EUA). This EUA will remain in effect (meaning this test can be used) for the duration of the COVID-19 declaration under Section 564(b)(1) of the Act, 21 U.S.C. section 360bbb-3(b)(1), unless the authorization is terminated or revoked.  Performed at [facility], [place] [address] [place], [place] [address]    Blood culture (routine x 2)     Status: None   Collection Time: 04/26/21 10:35 PM   Specimen: BLOOD RIGHT FOREARM  Result Value Ref Range Status   Specimen Description BLOOD RIGHT FOREARM  Final   Special Requests   Final    BOTTLES DRAWN AEROBIC AND ANAEROBIC Blood Culture results may not be optimal due to an inadequate volume of blood received in culture bottles   Culture   Final    NO GROWTH 5 DAYS Performed at [facility], [place] [address] [place], [place] [address]    Report Status 05/01/2021 FINAL  Final      Radiology Studies: PERIPHERAL VASCULAR CATHETERIZATION  Result Date: 05/04/2021 Images from the original result were not included. Patient name: Wendy Watts MRN: [id number] DOB: [birth year] Sex: female 05/04/2021 Pre-operative Diagnosis: Left leg DVT Post-operative diagnosis:  Same Surgeon:  [name] Procedure Performed:  1.  Ultrasound-guided access, left popliteal vein  2.  IVUS (intravascular ultrasound,  left popliteal, femoral, common femoral, external iliac, and common iliac veins and inferior vena cava  3.  Mechanical venous thrombectomy using the INARI device of the inferior vena cava, left common iliac, external iliac, common femoral, femoral, and popliteal veins  4.  Balloon venoplasty of the inferior vena cava, left common iliac, external iliac, common femoral, femoral, popliteal vein  5.  Venography of the left popliteal, femoral, common femoral, external iliac, and common iliac veins as well as the inferior vena cava  Indications: This is a 75 year old female with recent hip surgery who presented with a acute left leg DVT.  She comes in today for intervention. Procedure:  The patient was identified in the holding area and taken to room 8.  The patient was then placed supine on the table and prepped and draped in the usual sterile fashion.  A time out was called.  Ultrasound was used to evaluate the left popliteal vein which was distended and full of thrombus.  The  popliteal vein was then cannulated under ultrasound guidance with a micropuncture needle.  An 018 wire was advanced without resistance followed by placement of micropuncture sheath.  I then performed a quick puff of contrast to confirm that I was in the venous system and then advanced a Bentson wire centrally.  I then placed an 8 [place] sheath.  Next using a Berenstein 2 catheter and a Bentson wire, I was able to get the wire catheter up into the internal jugular vein.  I then switched out for an Amplatz wire and performed intravascular ultrasound of the left popliteal, femoral, common femoral, external iliac, and common iliac vein as well as AP vena cava.  This showed that there was thrombus in the infra vena cava extending up to the renal veins.  I then inserted the Inari sheath.  The patient was given an extra 5000 units of heparin.  Mechanical thrombectomy was then performed of the inferior vena cava, common iliac, external iliac, common femoral, femoral, and popliteal veins.  4 passes were performed.  Upon completion of rehab interrogated all of the veins with IVUS, and there did not appear to be any residual thrombus.  I then performed balloon venoplasty of the inferior vena cava, left common iliac, external iliac, common femoral, femoral, popliteal vein using a 12 x 80 Mustang balloon.  I then performed venography from the sheath up into the abdomen.  This showed no obvious filling defects and inline flow.  There did appear to be some narrowing of the common iliac vein which I have predilated with a 12 mm balloon.  I did not want to stent this given that this was acute thrombus after surgery, and because of the patient's age and dementia.  At this point is very satisfied with the results.  The sheath was removed and the site closed with a 3-0 nylon and rubber bolster. Findings: Thrombosed extended up in the inferior vena cava to the renal veins.  Successful mechanical thrombectomy and balloon venoplasty   Impression:  #1  Successful mechanical venous thrombectomy of the infra vena cava, left common iliac, external iliac, common femoral, femoral, and popliteal veins using the INARI device.  I also performed balloon venoplasty using a 12 mm balloon.  #2  Reestablishment of inline flow through the iliofemoral venous system after thrombectomy and venoplasty  #3  The patient will need to be on anticoagulation for 3 to 6 months as this was a provoked DVT.  Her procedural dressings will be removed tomorrow  or the next day and 20-30 thigh-high compression stockings will be placed V. [name], M.D., [facility] [place] Office: [phone] Pager:  [phone]   DG Swallowing Func-Speech Pathology  Result Date: 05/03/2021 Table formatting from the original result was not included. Objective Swallowing Evaluation: Type of Study: MBS-Modified Barium Swallow Study  Patient Details Name: FUMI GUADRON MRN: [id number] Date of Birth: 06/27/1946 Today's Date: 05/03/2021 Time: SLP Start Time (ACUTE ONLY): 1330 -SLP Stop Time (ACUTE ONLY): 1400 SLP Time Calculation (min) (ACUTE ONLY): 30 min Past Medical History: Past Medical History: Diagnosis Date  Anxiety   Dementia ([place])   High cholesterol   Vertigo  Past Surgical History: Past Surgical History: Procedure Laterality Date  HYSTERECTOMY    INTRAMEDULLARY (IM) NAIL INTERTROCHANTERIC Right 03/28/2021  Procedure: INTRAMEDULLARY (IM) NAIL INTERTROCHANTRIC;  Surgeon: Meredith Pel, MD;  Location: [facility];  Service: Orthopedics;  Laterality: Right;  TOTAL KNEE ARTHROPLASTY Right  HPI: Pt is a 75 y.o. female who was admitted secondary to nausea, vomiting and left lower extremity swelling. Pt's daughter reported to MD that pt has been having poor appetite and failure to thrive syndrome since recent surgery. CXR 9/12: Very mild left basilar atelectasis. PMH: advanced dementia, recent right hip fracture-s/p ORIF on 8/14  Subjective: alert, difficult to  convince to participate Assessment / Plan / Recommendation CHL IP CLINICAL IMPRESSIONS 05/03/2021 Clinical Impression Limited MBS completed due to pt's reluctance to participate; therefore minimal consistencies and quantities could be assessed.  She demonstrated functional mastication of a small portion of cracker and 1/2 tspn of pureed barium with normal onset of the swallow and no obvious deficits.  She allowed sips of thin barium, demonstrating high penetration x1 (Penetration/aspiration score of 2, considered WFL) and one incident of penetration to the vocal folds when throwing her head into extension. There was no aspiration.  Pt was noted to cough throughout the exam - with both solids and liquids- which was not correlated with swallow dysfunction/airway invasion.  Notable on one occasion was backflow of thin liquids into the cervical esophagus - no hx of GERD or esophageal deficits in chart.  Recommend continuing with her current diet given no evidence of aspiration.  Will f/u x1 for education. SLP Visit Diagnosis Dysphagia, unspecified (R13.10) Attention and concentration deficit following -- Frontal lobe and executive function deficit following -- Impact on safety and function --   CHL IP TREATMENT RECOMMENDATION 05/03/2021 Treatment Recommendations Therapy as outlined in treatment plan below   Prognosis 05/03/2021 Prognosis for Safe Diet Advancement Fair Barriers to Reach Goals Cognitive deficits;Motivation Barriers/Prognosis Comment -- CHL IP DIET RECOMMENDATION 05/03/2021 SLP Diet Recommendations Dysphagia 3 (Mech soft) solids;Thin liquid Liquid Administration via Cup;Straw Medication Administration Crushed with puree Compensations Minimize environmental distractions Postural Changes --   CHL IP OTHER RECOMMENDATIONS 05/03/2021 Recommended Consults -- Oral Care Recommendations Oral care BID Other Recommendations --   CHL IP FOLLOW UP RECOMMENDATIONS 05/03/2021 Follow up Recommendations 24 hour  supervision/assistance   CHL IP FREQUENCY AND DURATION 05/03/2021 Speech Therapy Frequency (ACUTE ONLY) min 1 x/week Treatment Duration 1 week      CHL IP ORAL PHASE 05/03/2021 Oral Phase WFL Oral - Pudding Teaspoon -- Oral - Pudding Cup -- Oral - Honey Teaspoon -- Oral - Honey Cup -- Oral - Nectar Teaspoon -- Oral - Nectar Cup -- Oral - Nectar Straw -- Oral - Thin Teaspoon -- Oral - Thin Cup -- Oral - Thin Straw -- Oral - Puree -- Oral -  Mech Soft -- Oral - Regular WFL Oral - Multi-Consistency -- Oral - Pill -- Oral Phase - Comment --  CHL IP PHARYNGEAL PHASE 05/03/2021 Pharyngeal Phase WFL Pharyngeal- Pudding Teaspoon -- Pharyngeal -- Pharyngeal- Pudding Cup -- Pharyngeal -- Pharyngeal- Honey Teaspoon -- Pharyngeal -- Pharyngeal- Honey Cup -- Pharyngeal -- Pharyngeal- Nectar Teaspoon -- Pharyngeal -- Pharyngeal- Nectar Cup -- Pharyngeal -- Pharyngeal- Nectar Straw -- Pharyngeal -- Pharyngeal- Thin Teaspoon -- Pharyngeal -- Pharyngeal- Thin Cup -- Pharyngeal -- Pharyngeal- Thin Straw -- Pharyngeal -- Pharyngeal- Puree -- Pharyngeal -- Pharyngeal- Mechanical Soft -- Pharyngeal -- Pharyngeal- Regular -- Pharyngeal -- Pharyngeal- Multi-consistency -- Pharyngeal -- Pharyngeal- Pill -- Pharyngeal -- Pharyngeal Comment --  No flowsheet data found. [name] 05/03/2021, 2:48 PM               Scheduled Meds:  (feeding supplement) PROSource Plus  30 mL Oral BID BM   busPIRone  10 mg Oral BID   donepezil  10 mg Oral QHS   feeding supplement  1 Container Oral TID BM   memantine  10 mg Oral BID   mirtazapine  7.5 mg Oral QHS   multivitamin with minerals  1 tablet Oral Daily   nystatin  5 mL Oral QID   polyethylene glycol  17 g Oral BID   senna  2 tablet Oral QHS   sodium chloride flush  3 mL Intravenous Q12H   Continuous Infusions:  sodium chloride     heparin 1,100 Units/hr (05/05/21 0832)     LOS: 9 days   Oren Binet, MD [facility] Pager On Amion  If 7PM-7AM, please contact  night-coverage 05/05/2021, 11:03 AM

## 2021-05-05 NOTE — Progress Notes (Signed)
[place] for heparin Indication: acute DVT  Allergies  Allergen Reactions   Allegra [Fexofenadine Hcl] Other (See Comments)    "takes me to another world"   Ativan [Lorazepam] Other (See Comments)    Hallucinations   Azithromycin Nausea And Vomiting   Codeine Hives   Doxycycline Hyclate Nausea Only   Penicillins Hives    Has patient had a PCN reaction causing immediate rash, facial/tongue/throat swelling, SOB or lightheadedness with hypotension: no Has patient had a PCN reaction causing severe rash involving mucus membranes or skin necrosis: no Has patient had a PCN reaction that required hospitalization: no Has patient had a PCN reaction occurring within the last 10 years: no If all of the above answers are "NO", then may proceed with Cephalosporin use.    Trazodone And Nefazodone Nausea Only   Zyrtec [Cetirizine] Other (See Comments)    "takes me to another world"    Patient Measurements: Height: 5\' 5"  (165.1 cm) Weight: 64.6 kg (142 lb 6.7 oz) IBW/kg (Calculated) : 57 Heparin Dosing Weight: 65 kg  Vital Signs: Temp: 98.2 F (36.8 C) (09/21 1147) Temp Source: Oral (09/21 1147) BP: 103/57 (09/21 1147) Pulse Rate: 76 (09/21 1147)  Labs: Recent Labs    05/03/21 1019 05/04/21 0320 05/05/21 0242 05/05/21 1625  HGB 12.8 11.1* 10.4*  --   HCT 38.9 33.6* 31.8*  --   PLT 255 347 279  --   HEPARINUNFRC  --  0.61 <0.10* 0.80*  CREATININE  --  0.71 0.83  --      Estimated Creatinine Clearance: 52.7 mL/min (by C-G formula based on SCr of 0.83 mg/dL).  Assessment: 72 YOF who recently had a R hip fracture s/p surgery here with acute DVT to start IV heparin for VTE treatment (acute L CIV thrombus on ultrasound). Plan for mechanical thrombectomy on 9/20.  Co-pay $45 for Eliquis or Xarelto, $5 for warfarin.  Heparin level of 0.8 on heparin 1100 units/hr is supratherapeutic. Per RN level drawn appropriately and no bleeding noted.    Goal of Therapy:  Heparin level 0.3-0.7 units/ml Monitor platelets by anticoagulation protocol: Yes   Plan:  Decrease heparin to 1000 units/hr  Check 8 hr heparin level  F/u daily heparin level, CBC stable Monitor for s/s of bleeding F/u transition to oral agent after thrombectomy  Cristela Felt, PharmD, BCPS Clinical Pharmacist 05/05/2021 6:17 PM

## 2021-05-05 NOTE — Progress Notes (Signed)
   Dressing removed and posterior knee red robin removed. Thigh compression ordered to be worn daily Good skin lines left LE with palpable DP pulse   Roxy Horseman PA-C

## 2021-05-05 NOTE — Progress Notes (Signed)
Inpatient Rehabilitation Admissions Coordinator   Once I have updated PT and OT postoperative assessments, I will begin insurance Auth with [employer] for a possible CIR admit pending their approval. I spoke with pt's daughter, [name], by phone and she is aware and in agreement. Acute team and TOC made aware.  Danne Baxter, RN, MSN Rehab Admissions Coordinator [phone] 05/05/2021 11:53 AM

## 2021-05-05 NOTE — Progress Notes (Signed)
OCCUPATIONAL THERAPY TREATMENT\ Pt. Was seen for OT to maximize pt. Ability to preform ADLs. Pt. Was able to preform ADLs sitting eob and in supine with pt. Leaning side to side for ADLs. Pt. Requires cues to follow 1 step directions. Pt. To be followed by acute OT.    05/05/21 1000  OT Visit Information  Last OT Received On 05/05/21  Assistance Needed +2  History of Present Illness Pt is a 75 y.o. female admitted from home on 04/26/21 with nausea, vomiting, LLE swelling. CXR with mild L basilar atelectasis. Found to have extensive LLE DVT, AKI, SIRS. Plan for LLE DVT mechanical thrombectomy on 9/20. PMH includes advanced dementia, recent R hip fx s/p ORIF (03/28/21 with d/c to SNF for ~20 days per husband), HOH, vertigo, anxiety.  Precautions  Precautions Fall  Precaution Comments pwb 50 lbs  R LE  Pain Assessment  Pain Assessment No/denies pain  Cognition  Arousal/Alertness Awake/alert  Behavior During Therapy WFL for tasks assessed/performed  Overall Cognitive Status History of cognitive impairments - at baseline  General Comments followed simple commands with incr time, multiple cues  Upper Extremity Assessment  Upper Extremity Assessment Generalized weakness  Lower Extremity Assessment  Lower Extremity Assessment Defer to PT evaluation  ADL  Overall ADL's  Needs assistance/impaired  Eating/Feeding Set up;Supervision/ safety  Grooming Set up;Supervision/safety;Sitting  Upper Body Bathing Minimal assistance;Sitting  Lower Body Bathing Maximal assistance;Sitting/lateral leans;Sit to/from stand;Moderate assistance  Upper Body Dressing  Minimal assistance;Sitting  Lower Body Dressing Moderate assistance;Maximal assistance;Sitting/lateral leans  General ADL Comments ADL sitting eob and in supne.  Bed Mobility  Overal bed mobility Needs Assistance  Bed Mobility Supine to Sit;Sit to Supine  Supine to sit Min assist  Sit to supine Mod assist  General bed mobility comments cues for  techique.  Balance  Overall balance assessment Needs assistance  Sitting balance-Leahy Scale Fair  Restrictions  Weight Bearing Restrictions Yes  RLE Weight Bearing PWB  Vision- Assessment  Vision Assessment? No apparent visual deficits  OT - End of Session  Activity Tolerance Patient tolerated treatment well  Patient left in bed;with call bell/phone within reach;with bed alarm set  Nurse Communication  (ok therapy)  OT Assessment/Plan  OT Plan Discharge plan remains appropriate  OT Visit Diagnosis Unsteadiness on feet (R26.81);Muscle weakness (generalized) (M62.81);Pain  OT Frequency (ACUTE ONLY) Min 2X/week  Follow Up Recommendations CIR;Supervision/Assistance - 24 hour  AM-PAC OT "6 Clicks" Daily Activity Outcome Measure (Version 2)  Help from another person eating meals? 3  Help from another person taking care of personal grooming? 3  Help from another person toileting, which includes using toliet, bedpan, or urinal? 1  Help from another person bathing (including washing, rinsing, drying)? 2  Help from another person to put on and taking off regular upper body clothing? 3  Help from another person to put on and taking off regular lower body clothing? 1  6 Click Score 13  Progressive Mobility  What is the highest level of mobility based on the progressive mobility assessment? Level 3 (Stands with assist) - Balance while standing  and cannot march in place  Mobility Sit up in bed/chair position for meals  OT Goal Progression  Progress towards OT goals Progressing toward goals  Acute Rehab OT Goals  Patient Stated Goal family wants cir  OT Goal Formulation Patient unable to participate in goal setting  Time For Goal Achievement 05/15/21  Potential to Achieve Goals Fair  ADL Goals  Pt Will Perform Grooming with  min guard assist;standing  Pt Will Perform Upper Body Bathing with set-up;sitting  Pt Will Perform Lower Body Bathing with min assist;sitting/lateral leans;sit to/from  stand  Pt Will Perform Upper Body Dressing with set-up;sitting  Pt Will Perform Lower Body Dressing with min assist;with adaptive equipment;sitting/lateral leans;sit to/from stand  Pt Will Transfer to Toilet with min guard assist;ambulating;regular height toilet;grab bars  Pt Will Perform Toileting - Clothing Manipulation and hygiene with min assist;sitting/lateral leans;sit to/from stand  Pt Will Perform Tub/Shower Transfer Tub transfer;Shower transfer;with min guard assist;ambulating;shower seat;rolling walker  Pt/caregiver will Perform Home Exercise Program Increased strength;Both right and left upper extremity;With Supervision  Additional ADL Goal #1 Pt will verbalize/demonstrate 3 fall prevention strategies to incorporate into her ADLs/ADL mobility to increase safety awareness with min guard-min assist overall.  Additional ADL Goal #2 Pt will be Ox4 and follow 2 step commands with 80% accuracy in order to safely participate in ADLs/ADL mobility with min-min guard overall.  Additional ADL Goal #3 Pt will verbalize/demonstrate RLE PWB status with 80% accuracy in order to safely participate in ADLs/ADL mobility with min guard-min assist overall.  OT Time Calculation  OT Start Time (ACUTE ONLY) 1005  OT Stop Time (ACUTE ONLY) 1043  OT Time Calculation (min) 38 min  OT General Charges  $OT Visit 1 Visit  OT Treatments  $Self Care/Home Management  23-37 mins  $Therapeutic Activity 8-22 mins  Reece Packer OT/L

## 2021-05-05 NOTE — Progress Notes (Signed)
[facility] for heparin Indication: acute DVT  Allergies  Allergen Reactions   Allegra [Fexofenadine Hcl] Other (See Comments)    "takes me to another world"   Ativan [Lorazepam] Other (See Comments)    Hallucinations   Azithromycin Nausea And Vomiting   Codeine Hives   Doxycycline Hyclate Nausea Only   Penicillins Hives    Has patient had a PCN reaction causing immediate rash, facial/tongue/throat swelling, SOB or lightheadedness with hypotension: no Has patient had a PCN reaction causing severe rash involving mucus membranes or skin necrosis: no Has patient had a PCN reaction that required hospitalization: no Has patient had a PCN reaction occurring within the last 10 years: no If all of the above answers are "NO", then may proceed with Cephalosporin use.    Trazodone And Nefazodone Nausea Only   Zyrtec [Cetirizine] Other (See Comments)    "takes me to another world"    Patient Measurements: Height: 5\' 5"  (165.1 cm) Weight: 64.6 kg (142 lb 6.7 oz) IBW/kg (Calculated) : 57 Heparin Dosing Weight: 65 kg  Vital Signs: Temp: 98 F (36.7 C) (09/21 0346) Temp Source: Oral (09/21 0346) BP: 113/52 (09/21 0346) Pulse Rate: 76 (09/21 0346)  Labs: Recent Labs    05/03/21 0235 05/03/21 1019 05/03/21 1019 05/04/21 0320 05/05/21 0242  HGB  --  12.8   < > 11.1* 10.4*  HCT  --  38.9  --  33.6* 31.8*  PLT  --  255  --  347 279  HEPARINUNFRC 0.43  --   --  0.61 <0.10*  CREATININE  --   --   --  0.71 0.83   < > = values in this interval not displayed.     Estimated Creatinine Clearance: 52.7 mL/min (by C-G formula based on SCr of 0.83 mg/dL).  Assessment: 39 YOF who recently had a R hip fracture s/p surgery here with acute DVT to start IV heparin for VTE treatment (acute L CIV thrombus on ultrasound). Plan for mechanical thrombectomy on 9/20.  Heparin level undetectable this morning after being therapeutic on previous rate. No bleeding or IV  issues noted. Likely will be able to change to oral anticoagulants this morning.   Co-pay $45 for Eliquis or Xarelto, $5 for warfarin.  Goal of Therapy:  Heparin level 0.3-0.7 units/ml Monitor platelets by anticoagulation protocol: Yes   Plan:  Increase heparin infusion to 1100 units/hr F/u daily heparin level, CBC stable Monitor for s/s of bleeding F/u transition to oral agent after thrombectomy  Erin Hearing PharmD., BCPS Clinical Pharmacist 05/05/2021 7:42 AM

## 2021-05-05 NOTE — Progress Notes (Signed)
Physical Therapy Treatment Patient Details Name: Wendy Watts MRN: [id number] DOB: [birth year] Today's Date: 05/05/2021   History of Present Illness Pt is a 75 y.o. female admitted from home on 04/26/21 with nausea, vomiting, LLE swelling. CXR with mild L basilar atelectasis. Found to have extensive LLE DVT, AKI, SIRS. S/p LLE DVT mechanical thrombectomy 9/20. PMH includes advanced dementia, recent R hip fx s/p ORIF (03/28/21 with d/c to SNF for ~20 days per husband), HOH, vertigo, anxiety.    PT Comments    Pt continues to perseverate on wanting ice and needing repeated redirection to remain on task or remind pt of task at hand. She required modA to transition supine > sit EOB with extra time this date. In addition, she continues to display poor compliance with 50% PWB on her R due to her impaired cognition, limiting her to only performing transfers at this time. She required modA to transfer to stand, then became anxious and impulsive and pushed self towards bed after already almost completing the transition to sit in the recliner, needing maxA to redirect and safely place in recliner. Will continue to follow acutely. Current recommendations remain appropriate.    Recommendations for follow up therapy are one component of a multi-disciplinary discharge planning process, led by the attending physician.  Recommendations may be updated based on patient status, additional functional criteria and insurance authorization.  Follow Up Recommendations  CIR;Supervision/Assistance - 24 hour     Equipment Recommendations  None recommended by PT    Recommendations for Other Services       Precautions / Restrictions Precautions Precautions: Fall;Other (comment) (cognitive deficits) Restrictions Weight Bearing Restrictions: Yes RLE Weight Bearing: Partial weight bearing RLE Partial Weight Bearing Percentage or Pounds: 50     Mobility  Bed Mobility Overal bed mobility: Needs Assistance Bed  Mobility: Supine to Sit     Supine to sit: Mod assist;HOB elevated     General bed mobility comments: Repeated cues to bring legs to EOB, pt lost memory of task at hand a couple times and needed reminders and redirecting. ModA to complete mobility of legs and trunk to sit up.    Transfers Overall transfer level: Needs assistance Equipment used: Rolling walker (2 wheeled) Transfers: Sit to/from [facility] Sit to Stand: Mod assist Stand pivot transfers: Max assist;Mod assist       General transfer comment: Cues and blocking of pt's R foot placed anterior to L to decrease amount of weight pt places on it with transfer to stand, modA to boost up and steady. Cued pt to transfer towards L bed > recliner with pt initiating and then becoming anxious and prematurely sitting, needing modA to direct towards chair then pt pushed suddenly and impulsively towards her R to place her trunk on the bed but buttocks on arm rest of chair, needing maxA to redirect her entire body to safely sit in recliner. Pt with poor compliance with 50% PWB on R, despite max cues and assistance to lift leg off floor.  Ambulation/Gait             General Gait Details: deferred as pt not maintaining 50% PWB when left foot leaves the floor; limited to transfer only   Stairs             Wheelchair Mobility    Modified Rankin (Stroke Patients Only)       Balance Overall balance assessment: Needs assistance Sitting-balance support: Feet supported Sitting balance-Leahy Scale: Fair  Standing balance support: Bilateral upper extremity supported;During functional activity Standing balance-Leahy Scale: Poor Standing balance comment: reliant on UE support and external physical assistance                            Cognition Arousal/Alertness: Awake/alert Behavior During Therapy: Restless;Anxious;Impulsive (perseverated on needing ice) Overall Cognitive Status: History of  cognitive impairments - at baseline                                 General Comments: Pt easily distracted, perseverating on ice, needing repeated cues to redirect and stay on task. Pt with STM deficits, forgetting task at hand quickly, repeatedly educating pt on plan for mobility or cuing to initiate. Pt anxious once standing and impulsive to push self back towards bed after already beginning to sit majority of way to chair, needing maxA to redirect pt safely to chair.      Exercises      General Comments        Pertinent Vitals/Pain Pain Assessment: Faces Faces Pain Scale: Hurts even more Pain Location: R leg Pain Descriptors / Indicators: Discomfort;Guarding;Moaning Pain Intervention(s): Monitored during session;Limited activity within patient's tolerance;Repositioned    Home Living                      Prior Function            PT Goals (current goals can now be found in the care plan section) Acute Rehab PT Goals Patient Stated Goal: to have ice PT Goal Formulation: With patient Time For Goal Achievement: 05/15/21 Potential to Achieve Goals: Fair Progress towards PT goals: Progressing toward goals    Frequency    Min 3X/week      PT Plan Current plan remains appropriate    Co-evaluation              AM-PAC PT "6 Clicks" Mobility   Outcome Measure  Help needed turning from your back to your side while in a flat bed without using bedrails?: A Little Help needed moving from lying on your back to sitting on the side of a flat bed without using bedrails?: A Lot Help needed moving to and from a bed to a chair (including a wheelchair)?: A Lot Help needed standing up from a chair using your arms (e.g., wheelchair or bedside chair)?: A Lot Help needed to walk in hospital room?: Total Help needed climbing 3-5 steps with a railing? : Total 6 Click Score: 11    End of Session Equipment Utilized During Treatment: Gait belt Activity  Tolerance: Patient tolerated treatment well;Other (comment) (limited by cognition) Patient left: in chair;with call bell/phone within reach;with chair alarm set Nurse Communication: Mobility status PT Visit Diagnosis: Other abnormalities of gait and mobility (R26.89);Muscle weakness (generalized) (M62.81);Pain;Unsteadiness on feet (R26.81);Difficulty in walking, not elsewhere classified (R26.2) Pain - Right/Left: Right Pain - part of body: Leg     Time: 1345-1414 PT Time Calculation (min) (ACUTE ONLY): 29 min  Charges:  $Therapeutic Activity: 23-37 mins                     Moishe Spice, PT, DPT Acute Rehabilitation Services  Pager: [phone] Office: [phone]    Orvan Falconer 05/05/2021, 2:36 PM

## 2021-05-05 NOTE — Progress Notes (Signed)
Inpatient Rehabilitation Admissions Coordinator   I have begun Auth with [employer] for a possible CIR admit.  Danne Baxter, RN, MSN Rehab Admissions Coordinator [phone] 05/05/2021 3:31 PM

## 2021-05-05 NOTE — Progress Notes (Addendum)
Progress Note    05/05/2021 7:29 AM 1 Day Post-Op  Subjective:  no leg pain   Vitals:   05/04/21 1945 05/05/21 0346  BP: 105/66 (!) 113/52  Pulse: 82 76  Resp: 16 18  Temp: 98.5 F (36.9 C) 98 F (36.7 C)  SpO2: 99%    Physical Exam: Cardiac:  regular Lungs:  non labored Extremities: 2+ left femoral pulse, left leg dressed, 2+ left DP Neurologic: alert but not oriented  CBC    Component Value Date/Time   WBC 9.3 05/05/2021 0242   RBC 3.41 (L) 05/05/2021 0242   HGB 10.4 (L) 05/05/2021 0242   HGB 13.7 05/08/2019 1156   HCT 31.8 (L) 05/05/2021 0242   HCT 41.4 05/08/2019 1156   PLT 279 05/05/2021 0242   PLT 357 05/08/2019 1156   MCV 93.3 05/05/2021 0242   MCV 92 05/08/2019 1156   MCH 30.5 05/05/2021 0242   MCHC 32.7 05/05/2021 0242   RDW 13.9 05/05/2021 0242   RDW 12.5 05/08/2019 1156   LYMPHSABS 0.8 04/27/2021 0717   MONOABS 2.0 (H) 04/27/2021 0717   EOSABS 0.0 04/27/2021 0717   BASOSABS 0.1 04/27/2021 0717    BMET    Component Value Date/Time   NA 137 05/05/2021 0242   NA 142 05/08/2019 1156   K 3.5 05/05/2021 0242   CL 106 05/05/2021 0242   CO2 22 05/05/2021 0242   GLUCOSE 78 05/05/2021 0242   BUN 7 (L) 05/05/2021 0242   BUN 11 05/08/2019 1156   CREATININE 0.83 05/05/2021 0242   CALCIUM 8.9 05/05/2021 0242   GFRNONAA >60 05/05/2021 0242   GFRAA 72 05/08/2019 1156    INR    Component Value Date/Time   INR 1.60 (H) 02/08/2010 0631     Intake/Output Summary (Last 24 hours) at 05/05/2021 0729 Last data filed at 05/05/2021 0406 Gross per 24 hour  Intake 59.18 ml  Output 300 ml  Net -240.82 ml     Assessment/Plan:  75 y.o. female is s/p  1.  Ultrasound-guided access, left popliteal vein            2.  IVUS (intravascular ultrasound, left popliteal, femoral, common femoral, external iliac, and common iliac veins and inferior vena cava            3.  Mechanical venous thrombectomy using the INARI device of the inferior vena cava, left common  iliac, external iliac, common femoral, femoral, and popliteal veins            4.  Balloon venoplasty of the inferior vena cava, left common iliac, external iliac, common femoral, femoral, popliteal vein            5.  Venography of the left popliteal, femoral, common femoral, external iliac, and common iliac veins as well as the inferior vena cava 1 Day Post-Op   Doing well post intervention. No pain in left leg. LLE well perfused with palpable pulses. Dressings taken down this morning. Order placed for thigh high compression stockings. Continue IV heparin. Can transition to oral anticoagulation which she will need to be on x 3-6 months  DVT prophylaxis:  IV heparin   Karoline Caldwell, PA-C [facility] [phone] 05/05/2021 7:29 AM  I agree with the above.   I have seen and examined the patient.  Her left leg edema is improved.  We will remove the suture behind her knee and place her in thigh high compression socks.  She can be transitioned to oral  anticoagulation. She can be discharged from my perspective.  [name]

## 2021-05-06 DIAGNOSIS — N179 Acute kidney failure, unspecified: Secondary | ICD-10-CM | POA: Diagnosis not present

## 2021-05-06 DIAGNOSIS — F0390 Unspecified dementia without behavioral disturbance: Secondary | ICD-10-CM | POA: Diagnosis not present

## 2021-05-06 DIAGNOSIS — E440 Moderate protein-calorie malnutrition: Secondary | ICD-10-CM | POA: Diagnosis not present

## 2021-05-06 DIAGNOSIS — I82412 Acute embolism and thrombosis of left femoral vein: Secondary | ICD-10-CM | POA: Diagnosis not present

## 2021-05-06 DIAGNOSIS — I82442 Acute embolism and thrombosis of left tibial vein: Secondary | ICD-10-CM | POA: Diagnosis not present

## 2021-05-06 DIAGNOSIS — I82432 Acute embolism and thrombosis of left popliteal vein: Secondary | ICD-10-CM | POA: Diagnosis not present

## 2021-05-06 DIAGNOSIS — R112 Nausea with vomiting, unspecified: Secondary | ICD-10-CM | POA: Diagnosis not present

## 2021-05-06 DIAGNOSIS — I82452 Acute embolism and thrombosis of left peroneal vein: Secondary | ICD-10-CM | POA: Diagnosis not present

## 2021-05-06 LAB — HEPARIN LEVEL (UNFRACTIONATED): Heparin Unfractionated: 0.79 IU/mL — ABNORMAL HIGH (ref 0.30–0.70)

## 2021-05-06 LAB — BASIC METABOLIC PANEL
Anion gap: 7 (ref 5–15)
BUN: <5 mg/dL — ABNORMAL LOW (ref 8–23)
CO2: 26 mmol/L (ref 22–32)
Calcium: 9 mg/dL (ref 8.9–10.3)
Chloride: 105 mmol/L (ref 98–111)
Creatinine, Ser: 0.72 mg/dL (ref 0.44–1.00)
GFR, Estimated: >60 mL/min (ref >60)
Glucose, Bld: 88 mg/dL (ref 70–99)
Potassium: 3.5 mmol/L (ref 3.5–5.1)
Sodium: 138 mmol/L (ref 135–145)

## 2021-05-06 LAB — MAGNESIUM: Magnesium: 2 mg/dL (ref 1.7–2.4)

## 2021-05-06 MED ORDER — APIXABAN 5 MG PO TABS
5.0000 mg | ORAL_TABLET | Freq: Two times a day (BID) | ORAL | Status: DC
  Administered 2021-05-06 – 2021-05-09 (×7): 5 mg via ORAL
  Filled 2021-05-06 (×8): qty 1

## 2021-05-06 MED ORDER — CYANOCOBALAMIN 1000 MCG/ML IJ SOLN
1000.0000 ug | Freq: Every day | INTRAMUSCULAR | Status: DC
  Administered 2021-05-06 – 2021-05-11 (×5): 1000 ug via SUBCUTANEOUS
  Filled 2021-05-06 (×7): qty 1

## 2021-05-06 NOTE — Progress Notes (Signed)
PROGRESS NOTE    Wendy Watts  YQI:[id number] DOB: 1946-05-31 DOA: 04/26/2021 PCP: [name], MD    Brief Narrative:  75 y.o. female with history of advanced dementia who was recently admitted for right hip fracture discharged to rehab on March 30, 2021-brought to the ED on 9/12 for significant LLE swelling along with nausea/vomiting-patient was found to have extensive LLE DVT and AKI.  She was admitted to the hospitalist service-AKI was treated with supportive care-vascular surgery was consulted-patient subsequently underwent thrombectomy on 9/20.  See below for further details.  Subjective:  Pleasantly confused-no major events overnight.  Asking for ice chips.  Objective: Vitals: Blood pressure 107/60, pulse 77, temperature 98.3 F (36.8 C), temperature source Oral, resp. rate 18, height 5\' 5"  (1.651 m), weight 64.6 kg, SpO2 99 %.   Exam: Gen Exam:Alert awake-not in any distress HEENT:atraumatic, normocephalic Chest: B/L clear to auscultation anteriorly CVS:S1S2 regular Abdomen:soft non tender, non distended Extremities: Minimal LLE swelling persists. Neurology: Non focal Skin: no rash   Pertinent labs/radiology: Creatinine: 0.72 Magnesium: 2.0  K: 3.5   Doppler on 9/13: DVT involving left CFV, SF junction, left femoral vein, left proximal profunda vein, left popliteal vein, left posterior tibial vein, left peroneal vein, left gastrocnemius vein  Assessment & Plan: AKI: Hemodynamically mediated-due to nausea/vomiting-renal failure has resolved-creatinine back to baseline.    SIRS: Noninfectious etiology-likely from swelling/inflammation from extensive LLE DVT.  UA/CXR/blood cultures remain negative.  All antimicrobial therapy discontinued on 9/14.  Leukocytosis has normalized.  Nausea/vomiting: Resolved-could have had a transient viral syndrome.  CT abdomen negative for any acute abnormalities.  HIDA scan was negative for cholecystitis.  Tolerating advancement in  diet.  Hypokalemia: Repleted.  Extensive LLE DVT s/p thrombectomy on 9/20: Was on IV heparin-has been transitioned to Eliquis.  Suspect DVT was provoked by recent hip surgery.  Given significant clot burden-will require at least 6 months of anticoagulation.   Constipation: Significant stool burden seen on CT-given Dulcolax suppository with significant results-continue MiraLAX/senna.  Recent right hip fracture-s/p ORIF on 8/14: Per last orthopedic note on 8/15-nonweightbearing for right lower extremity (but okay for touchdown weightbearing)  Dementia: Pleasantly confused-continue Aricept/Namenda/BuSpar.  Oral thrush: Start nystatin x7 days.  Vitamin B12 deficiency: Started supplementation on 9/18.  Failure to thrive syndrome: Per patient's daughter-ever since she had recent hip fracture-her appetite has been poor-and has been failing to thrive syndrome.  Per nursing staff-continues to have poor oral intake-have started Remeron.  Debility/deconditioning: PT/OT evaluation in progress-recommendations are for CIR.  Nutrition Status: Nutrition Problem: Moderate Malnutrition Etiology: chronic illness (dementia) Signs/Symptoms: mild fat depletion, moderate fat depletion, mild muscle depletion, moderate muscle depletion, energy intake < 75% for > or equal to 1 month Interventions: Boost Breeze, MVI, Prostat     DVT prophylaxis: Heparin gtt Code Status: Full Family Communication: Daughter-[phone]-updated over the phone on 9/21  Status is: Inpatient  Remains inpatient appropriate because:Inpatient level of care appropriate due to severity of illness  Dispo: The patient is from: Home              Anticipated d/c is to:  Unclear at this time              Patient currently is not medically stable to d/c.  Awaiting CIR bed.   Difficult to place patient No    Consultants:  Vascular Surgery  Procedures:     1.  Ultrasound-guided access, left popliteal vein    2.  IVUS  (intravascular ultrasound, left popliteal,  femoral, common femoral, external iliac, and common iliac veins and inferior vena cava            3.  Mechanical venous thrombectomy using the INARI device of the inferior vena cava, left common iliac, external iliac, common femoral, femoral, and popliteal veins            4.  Balloon venoplasty of the inferior vena cava, left common iliac, external iliac, common femoral, femoral, popliteal vein            5.  Venography of the left popliteal, femoral, common femoral, external iliac, and common iliac veins as well as the inferior vena cava  Antimicrobials: Anti-infectives (From admission, onward)    Start     Dose/Rate Route Frequency Ordered Stop   04/27/21 0000  ceFEPIme (MAXIPIME) 2 g in sodium chloride 0.9 % 100 mL IVPB  Status:  Discontinued        2 g 200 mL/hr over 30 Minutes Intravenous Every 24 hours 04/26/21 2128 04/28/21 1626   04/26/21 2200  metroNIDAZOLE (FLAGYL) IVPB 500 mg  Status:  Discontinued        500 mg 100 mL/hr over 60 Minutes Intravenous Every 12 hours 04/26/21 2124 04/28/21 1626   04/26/21 2130  ceFEPIme (MAXIPIME) 2 g in sodium chloride 0.9 % 100 mL IVPB  Status:  Discontinued        2 g 200 mL/hr over 30 Minutes Intravenous  Once 04/26/21 2124 04/28/21 1626       Filed Weights   04/27/21 2146 05/01/21 0400 05/03/21 0422  Weight: 69.8 kg 65.1 kg 64.6 kg    Data Reviewed: I have personally reviewed following labs and imaging studies  CBC: Recent Labs  Lab 04/30/21 0127 05/01/21 0038 05/03/21 1019 05/04/21 0320 05/05/21 0242  WBC 14.4* 12.4* 11.6* 9.6 9.3  HGB 10.9* 11.0* 12.8 11.1* 10.4*  HCT 33.2* 34.0* 38.9 33.6* 31.8*  MCV 93.8 94.2 93.5 92.8 93.3  PLT 265 292 255 347 784    Basic Metabolic Panel: Recent Labs  Lab 04/30/21 0127 05/01/21 0038 05/04/21 0320 05/05/21 0242 05/06/21 0242  NA 140 139 134* 137 138  K 3.8 3.7 3.4* 3.5 3.5  CL 107 106 100 106 105  CO2 24 24 27 22 26   GLUCOSE 102* 87  86 78 88  BUN 31* 28* 10 7* <5*  CREATININE 1.17* 1.05* 0.71 0.83 0.72  CALCIUM 10.0 10.1 9.5 8.9 9.0  MG  --   --   --  1.5* 2.0    GFR: Estimated Creatinine Clearance: 54.7 mL/min (by C-G formula based on SCr of 0.72 mg/dL). Liver Function Tests: No results for input(s): AST, ALT, ALKPHOS, BILITOT, PROT, ALBUMIN in the last 168 hours.  No results for input(s): LIPASE, AMYLASE in the last 168 hours.  No results for input(s): AMMONIA in the last 168 hours. Coagulation Profile: No results for input(s): INR, PROTIME in the last 168 hours. Cardiac Enzymes: No results for input(s): CKTOTAL, CKMB, CKMBINDEX, TROPONINI in the last 168 hours. BNP (last 3 results) No results for input(s): PROBNP in the last 8760 hours. HbA1C: No results for input(s): HGBA1C in the last 72 hours. CBG: No results for input(s): GLUCAP in the last 168 hours. Lipid Profile: No results for input(s): CHOL, HDL, LDLCALC, TRIG, CHOLHDL, LDLDIRECT in the last 72 hours. Thyroid Function Tests: No results for input(s): TSH, T4TOTAL, FREET4, T3FREE, THYROIDAB in the last 72 hours. Anemia Panel: No results for input(s): VITAMINB12, FOLATE, FERRITIN, TIBC, IRON,  RETICCTPCT in the last 72 hours.  Sepsis Labs: No results for input(s): PROCALCITON, LATICACIDVEN in the last 168 hours.   Recent Results (from the past 240 hour(s))  Resp Panel by RT-PCR (Flu A&B, Covid) Nasopharyngeal Swab     Status: None   Collection Time: 04/26/21  6:58 PM   Specimen: Nasopharyngeal Swab; Nasopharyngeal(NP) swabs in vial transport medium  Result Value Ref Range Status   SARS Coronavirus 2 by RT PCR NEGATIVE NEGATIVE Final    Comment: (NOTE) SARS-CoV-2 target nucleic acids are NOT DETECTED.  The SARS-CoV-2 RNA is generally detectable in upper respiratory specimens during the acute phase of infection. The lowest concentration of SARS-CoV-2 viral copies this assay can detect is 138 copies/mL. A negative result does not preclude  SARS-Cov-2 infection and should not be used as the sole basis for treatment or other patient management decisions. A negative result may occur with  improper specimen collection/handling, submission of specimen other than nasopharyngeal swab, presence of viral mutation(s) within the areas targeted by this assay, and inadequate number of viral copies(<138 copies/mL). A negative result must be combined with clinical observations, patient history, and epidemiological information. The expected result is Negative.  Fact Sheet for Patients:  EntrepreneurPulse.com.au  Fact Sheet for Healthcare Providers:  IncredibleEmployment.be  This test is no t yet approved or cleared by the [place] FDA and  has been authorized for detection and/or diagnosis of SARS-CoV-2 by FDA under an Emergency Use Authorization (EUA). This EUA will remain  in effect (meaning this test can be used) for the duration of the COVID-19 declaration under Section 564(b)(1) of the Act, 21 U.S.C.section 360bbb-3(b)(1), unless the authorization is terminated  or revoked sooner.       Influenza A by PCR NEGATIVE NEGATIVE Final   Influenza B by PCR NEGATIVE NEGATIVE Final    Comment: (NOTE) The Xpert Xpress SARS-CoV-2/FLU/RSV plus assay is intended as an aid in the diagnosis of influenza from Nasopharyngeal swab specimens and should not be used as a sole basis for treatment. Nasal washings and aspirates are unacceptable for Xpert Xpress SARS-CoV-2/FLU/RSV testing.  Fact Sheet for Patients: EntrepreneurPulse.com.au  Fact Sheet for Healthcare Providers: IncredibleEmployment.be  This test is not yet approved or cleared by the [place] FDA and has been authorized for detection and/or diagnosis of SARS-CoV-2 by FDA under an Emergency Use Authorization (EUA). This EUA will remain in effect (meaning this test can be used) for the duration of  the COVID-19 declaration under Section 564(b)(1) of the Act, 21 U.S.C. section 360bbb-3(b)(1), unless the authorization is terminated or revoked.  Performed at [facility], [place] [address] [place], [place] [address]   Blood culture (routine x 2)     Status: None   Collection Time: 04/26/21 10:35 PM   Specimen: BLOOD RIGHT FOREARM  Result Value Ref Range Status   Specimen Description BLOOD RIGHT FOREARM  Final   Special Requests   Final    BOTTLES DRAWN AEROBIC AND ANAEROBIC Blood Culture results may not be optimal due to an inadequate volume of blood received in culture bottles   Culture   Final    NO GROWTH 5 DAYS Performed at [facility], [place] [address], [place], [place] [address]    Report Status 05/01/2021 FINAL  Final      Radiology Studies: PERIPHERAL VASCULAR CATHETERIZATION  Result Date: 05/04/2021 Images from the original result were not included. Patient name: Wendy Watts MRN: [id number] DOB: [birth year] Sex: female 05/04/2021 Pre-operative Diagnosis:  Left leg DVT Post-operative diagnosis:  Same Surgeon:  [name] Procedure Performed:  1.  Ultrasound-guided access, left popliteal vein  2.  IVUS (intravascular ultrasound, left popliteal, femoral, common femoral, external iliac, and common iliac veins and inferior vena cava  3.  Mechanical venous thrombectomy using the INARI device of the inferior vena cava, left common iliac, external iliac, common femoral, femoral, and popliteal veins  4.  Balloon venoplasty of the inferior vena cava, left common iliac, external iliac, common femoral, femoral, popliteal vein  5.  Venography of the left popliteal, femoral, common femoral, external iliac, and common iliac veins as well as the inferior vena cava  Indications: This is a 75 year old female with recent hip surgery who presented with a acute left leg DVT.  She comes in today for intervention. Procedure:  The patient was identified in the holding area and taken to room 8.   The patient was then placed supine on the table and prepped and draped in the usual sterile fashion.  A time out was called.  Ultrasound was used to evaluate the left popliteal vein which was distended and full of thrombus.  The popliteal vein was then cannulated under ultrasound guidance with a micropuncture needle.  An 018 wire was advanced without resistance followed by placement of micropuncture sheath.  I then performed a quick puff of contrast to confirm that I was in the venous system and then advanced a Bentson wire centrally.  I then placed an 8 [place] sheath.  Next using a Berenstein 2 catheter and a Bentson wire, I was able to get the wire catheter up into the internal jugular vein.  I then switched out for an Amplatz wire and performed intravascular ultrasound of the left popliteal, femoral, common femoral, external iliac, and common iliac vein as well as AP vena cava.  This showed that there was thrombus in the infra vena cava extending up to the renal veins.  I then inserted the Inari sheath.  The patient was given an extra 5000 units of heparin.  Mechanical thrombectomy was then performed of the inferior vena cava, common iliac, external iliac, common femoral, femoral, and popliteal veins.  4 passes were performed.  Upon completion of rehab interrogated all of the veins with IVUS, and there did not appear to be any residual thrombus.  I then performed balloon venoplasty of the inferior vena cava, left common iliac, external iliac, common femoral, femoral, popliteal vein using a 12 x 80 Mustang balloon.  I then performed venography from the sheath up into the abdomen.  This showed no obvious filling defects and inline flow.  There did appear to be some narrowing of the common iliac vein which I have predilated with a 12 mm balloon.  I did not want to stent this given that this was acute thrombus after surgery, and because of the patient's age and dementia.  At this point is very satisfied with the  results.  The sheath was removed and the site closed with a 3-0 nylon and rubber bolster. Findings: Thrombosed extended up in the inferior vena cava to the renal veins.  Successful mechanical thrombectomy and balloon venoplasty  Impression:  #1  Successful mechanical venous thrombectomy of the infra vena cava, left common iliac, external iliac, common femoral, femoral, and popliteal veins using the INARI device.  I also performed balloon venoplasty using a 12 mm balloon.  #2  Reestablishment of inline flow through the iliofemoral venous system after thrombectomy and venoplasty  #3  The patient will need to be on anticoagulation for 3 to 6 months as this was a provoked DVT.  Her procedural dressings will be removed tomorrow or the next day and 20-30 thigh-high compression stockings will be placed V. [name], M.D., FACS [facility] Office: [phone] Pager:  [phone]    Scheduled Meds:  (feeding supplement) PROSource Plus  30 mL Oral BID BM   apixaban  5 mg Oral BID   busPIRone  10 mg Oral BID   donepezil  10 mg Oral QHS   feeding supplement  1 Container Oral TID BM   memantine  10 mg Oral BID   mirtazapine  7.5 mg Oral QHS   multivitamin with minerals  1 tablet Oral Daily   nystatin  5 mL Oral QID   polyethylene glycol  17 g Oral BID   senna  2 tablet Oral QHS   sodium chloride flush  3 mL Intravenous Q12H   Continuous Infusions:  sodium chloride       LOS: 10 days   Oren Binet, MD [facility] Pager On Amion  If 7PM-7AM, please contact night-coverage 05/06/2021, 12:16 PM

## 2021-05-06 NOTE — Progress Notes (Signed)
Inpatient Rehabilitation Admissions Coordinator   [employer] has requested a peer to peer with treating MD. I have notified Dr Sloan Leiter of request. I contacted daughter, [name] and she is aware of [facility] peer to peer request and likely not to be approved for Cir.  Danne Baxter, RN, MSN Rehab Admissions Coordinator [phone] 05/06/2021 2:41 PM

## 2021-05-06 NOTE — Progress Notes (Signed)
Nutrition Follow-up  DOCUMENTATION CODES:   Non-severe (moderate) malnutrition in context of chronic illness  INTERVENTION:   Continue to assist with meals. Continue Boost Breeze po TID, each supplement provides 250 kcal and 9 grams of protein. Continue Prosource Plus 30 ml PO BID, each packet provides 100 kcal and 15 gm protein. Continue MVI with minerals daily.  NUTRITION DIAGNOSIS:   Moderate Malnutrition related to chronic illness (dementia) as evidenced by mild fat depletion, moderate fat depletion, mild muscle depletion, moderate muscle depletion, energy intake < 75% for > or equal to 1 month.  Ongoing  GOAL:   Patient will meet greater than or equal to 90% of their needs  Progressing   MONITOR:   PO intake, Supplement acceptance, Diet advancement, Labs, Weight trends, Skin, I & O's  REASON FOR ASSESSMENT:   Malnutrition Screening Tool    ASSESSMENT:   Wendy Watts is a 75 y.o. female with history of advanced dementia who was recently admitted for right hip fracture discharged to rehab on March 30, 2021 and was discharged from rehab on April 21, 2021 back to home was having nausea vomiting for the last 2 days and was not doing well.  Patient's daughter went to check on her today and found that patient in addition to the nausea vomiting was having significant swelling of the left lower extremity and was brought to the ER.  Patient has been largely bedbound with nonweightbearing since her surgery.  9/20 S/P thrombectomy of left leg DVT. Plans for d/c to CIR pending insurance authorization.   Remains on dysphagia 3 diet with thin liquids.  Patient requires assistance with meals. Meal intakes: 30-40% Receiving Boost Breeze po TID and Prosource Plus 30 ml BID.  Labs and medications reviewed.  Remeron added 9/17.   Weight stable since admission. Currently 64.6 kg (9/19)  Diet Order:   Diet Order             DIET DYS 3 Room service appropriate? Yes; Fluid  consistency: Thin  Diet effective now                   EDUCATION NEEDS:   Education needs have been addressed  Skin:  Skin Assessment: Reviewed RN Assessment  Last BM:  last BM documented 9/16 type 7  Height:   Ht Readings from Last 1 Encounters:  04/27/21 5\' 5"  (1.651 m)    Weight:   Wt Readings from Last 1 Encounters:  05/03/21 64.6 kg    Ideal Body Weight:  56.8 kg  BMI:  Body mass index is 23.7 kg/m.  Estimated Nutritional Needs:   Kcal:  1900-2100  Protein:  90-105 grams  Fluid:  > 1.9 L    Lucas Mallow, RD, LDN, CNSC Please refer to Amion for contact information.

## 2021-05-06 NOTE — Discharge Instructions (Addendum)
Follow with Primary MD [name], MD in 7 days   Get CBC, CMP, 2 view Chest X ray -  checked next visit within 1 week by Primary MD   Activity: As tolerated with Full fall precautions use walker/cane & assistance as needed, review L. Arm activity instructions as given by cardiology.  Disposition Home   Diet: Heart Healthy    Special Instructions: If you have smoked or chewed Tobacco  in the last 2 yrs please stop smoking, stop any regular Alcohol  and or any Recreational drug use.  On your next visit with your primary care physician please Get Medicines reviewed and adjusted.  Please request your Prim.MD to go over all Hospital Tests and Procedure/Radiological results at the follow up, please get all Hospital records sent to your Prim MD by signing hospital release before you go home.  If you experience worsening of your admission symptoms, develop shortness of breath, life threatening emergency, suicidal or homicidal thoughts you must seek medical attention immediately by calling 911 or calling your MD immediately  if symptoms less severe.  You Must read complete instructions/literature along with all the possible adverse reactions/side effects for all the Medicines you take and that have been prescribed to you. Take any new Medicines after you have completely understood and accpet all the possible adverse reactions/side effects.        Supplemental Discharge Instructions for  Pacemaker/Defibrillator Patients   Activity No heavy lifting or vigorous activity with your left/right arm for 6 to 8 weeks.  Do not raise your left/right arm above your head for one week.  Gradually raise your affected arm as drawn below.             05/15/21                        05/16/21                       05/17/21                     05/18/21 __  NO DRIVING until cleared to by MD  [place] the wound area clean and dry.  Do not get this area wet , no showers for one week; you may shower  on 05/18/21  . The tape/steri-strips on your wound will fall off; do not pull them off.  No bandage is needed on the site.  DO  NOT apply any creams, oils, or ointments to the wound area. If you notice any drainage or discharge from the wound, any swelling or bruising at the site, or you develop a fever > 101? F after you are discharged home, call the office at once.  Special Instructions You are still able to use cellular telephones; use the ear opposite the side where you have your pacemaker/defibrillator.  Avoid carrying your cellular phone near your device. When traveling through airports, show security personnel your identification card to avoid being screened in the metal detectors.  Ask the security personnel to use the hand wand. Avoid arc welding equipment, MRI testing (magnetic resonance imaging), TENS units (transcutaneous nerve stimulators).  Call the office for questions about other devices. Avoid electrical appliances that are in poor condition or are not properly grounded. Microwave ovens are safe to be near or to operate.       Information on my medicine - ELIQUIS (apixaban)  Why was Eliquis prescribed for  you? Eliquis was prescribed to treat blood clots that may have been found in the veins of your legs (deep vein thrombosis) or in your lungs (pulmonary embolism) and to reduce the risk of them occurring again.  What do You need to know about Eliquis ? The dose is ONE 5 mg tablet taken TWICE daily.  Eliquis may be taken with or without food.   Try to take the dose about the same time in the morning and in the evening. If you have difficulty swallowing the tablet whole please discuss with your pharmacist how to take the medication safely.  Take Eliquis exactly as prescribed and DO NOT stop taking Eliquis without talking to the doctor who prescribed the medication.  Stopping may increase your risk of developing a new blood clot.  Refill your prescription before you run  out.  After discharge, you should have regular check-up appointments with your healthcare provider that is prescribing your Eliquis.    What do you do if you miss a dose? If a dose of ELIQUIS is not taken at the scheduled time, take it as soon as possible on the same day and twice-daily administration should be resumed. The dose should not be doubled to make up for a missed dose.  Important Safety Information A possible side effect of Eliquis is bleeding. You should call your healthcare provider right away if you experience any of the following: Bleeding from an injury or your nose that does not stop. Unusual colored urine (red or dark brown) or unusual colored stools (red or black). Unusual bruising for unknown reasons. A serious fall or if you hit your head (even if there is no bleeding).  Some medicines may interact with Eliquis and might increase your risk of bleeding or clotting while on Eliquis. To help avoid this, consult your healthcare provider or pharmacist prior to using any new prescription or non-prescription medications, including herbals, vitamins, non-steroidal anti-inflammatory drugs (NSAIDs) and supplements.  This website has more information on Eliquis (apixaban): http://www.eliquis.com/eliquis/home

## 2021-05-06 NOTE — Progress Notes (Signed)
[place] for heparin>apixaban Indication: acute DVT  Allergies  Allergen Reactions   Allegra [Fexofenadine Hcl] Other (See Comments)    "takes me to another world"   Ativan [Lorazepam] Other (See Comments)    Hallucinations   Azithromycin Nausea And Vomiting   Codeine Hives   Doxycycline Hyclate Nausea Only   Penicillins Hives    Has patient had a PCN reaction causing immediate rash, facial/tongue/throat swelling, SOB or lightheadedness with hypotension: no Has patient had a PCN reaction causing severe rash involving mucus membranes or skin necrosis: no Has patient had a PCN reaction that required hospitalization: no Has patient had a PCN reaction occurring within the last 10 years: no If all of the above answers are "NO", then may proceed with Cephalosporin use.    Trazodone And Nefazodone Nausea Only   Zyrtec [Cetirizine] Other (See Comments)    "takes me to another world"    Patient Measurements: Height: 5\' 5"  (165.1 cm) Weight: 64.6 kg (142 lb 6.7 oz) IBW/kg (Calculated) : 57 Heparin Dosing Weight: 65 kg  Vital Signs: Temp: 98.3 F (36.8 C) (09/22 0553) Temp Source: Oral (09/22 0553) BP: 107/60 (09/22 0553) Pulse Rate: 77 (09/22 0553)  Labs: Recent Labs    05/03/21 1019 05/03/21 1019 05/04/21 0320 05/05/21 0242 05/05/21 1625 05/06/21 0242  HGB 12.8  --  11.1* 10.4*  --   --   HCT 38.9  --  33.6* 31.8*  --   --   PLT 255  --  347 279  --   --   HEPARINUNFRC  --    < > 0.61 <0.10* 0.80* 0.79*  CREATININE  --   --  0.71 0.83  --  0.72   < > = values in this interval not displayed.     Estimated Creatinine Clearance: 54.7 mL/min (by C-G formula based on SCr of 0.72 mg/dL).  Assessment: 10 YOF who recently had a R hip fracture s/p surgery here with acute DVT to start IV heparin for VTE treatment (acute L CIV thrombus on ultrasound). Plan for mechanical thrombectomy on 9/20.  Co-pay $45 for Eliquis or Xarelto, $5 for  warfarin.  Heparin adjusted overnight, orders to change to apixaban this morning. No bleeding issues noted. CBC not done this morning.   Patient has received IV heparin since 9/12 and has been therapeutic except for one outlier lab. Will start apixaban at standard dosing without initial week load.   Goal of Therapy:  Heparin level 0.3-0.7 units/ml Monitor platelets by anticoagulation protocol: Yes   Plan Stop heparin once apixaban is given, start apixaban 5mg  bid this morning  Erin Hearing PharmD., BCPS Clinical Pharmacist 05/06/2021 9:18 AM

## 2021-05-06 NOTE — Progress Notes (Signed)
[place] for heparin Indication: acute DVT  Allergies  Allergen Reactions   Allegra [Fexofenadine Hcl] Other (See Comments)    "takes me to another world"   Ativan [Lorazepam] Other (See Comments)    Hallucinations   Azithromycin Nausea And Vomiting   Codeine Hives   Doxycycline Hyclate Nausea Only   Penicillins Hives    Has patient had a PCN reaction causing immediate rash, facial/tongue/throat swelling, SOB or lightheadedness with hypotension: no Has patient had a PCN reaction causing severe rash involving mucus membranes or skin necrosis: no Has patient had a PCN reaction that required hospitalization: no Has patient had a PCN reaction occurring within the last 10 years: no If all of the above answers are "NO", then may proceed with Cephalosporin use.    Trazodone And Nefazodone Nausea Only   Zyrtec [Cetirizine] Other (See Comments)    "takes me to another world"    Patient Measurements: Height: 5\' 5"  (165.1 cm) Weight: 64.6 kg (142 lb 6.7 oz) IBW/kg (Calculated) : 57 Heparin Dosing Weight: 65 kg  Vital Signs: Temp: 98.5 F (36.9 C) (09/21 1956) Temp Source: Oral (09/21 1956) BP: 102/58 (09/21 1956) Pulse Rate: 86 (09/21 1956)  Labs: Recent Labs    05/03/21 1019 05/03/21 1019 05/04/21 0320 05/05/21 0242 05/05/21 1625 05/06/21 0242  HGB 12.8  --  11.1* 10.4*  --   --   HCT 38.9  --  33.6* 31.8*  --   --   PLT 255  --  347 279  --   --   HEPARINUNFRC  --    < > 0.61 <0.10* 0.80* 0.79*  CREATININE  --   --  0.71 0.83  --  0.72   < > = values in this interval not displayed.     Estimated Creatinine Clearance: 54.7 mL/min (by C-G formula based on SCr of 0.72 mg/dL).  Assessment: 46 YOF who recently had a R hip fracture s/p surgery here with acute DVT to start IV heparin for VTE treatment (acute L CIV thrombus on ultrasound). Plan for mechanical thrombectomy on 9/20.  Co-pay $45 for Eliquis or Xarelto, $5 for  warfarin.  Heparin level of 0.8 on heparin 1100 units/hr is supratherapeutic. Per RN level drawn appropriately and no bleeding noted.   Heparin level still came back elevated at 0.79. We will decrease again and recheck level. No bleeding per Rn.   Goal of Therapy:  Heparin level 0.3-0.7 units/ml Monitor platelets by anticoagulation protocol: Yes   Plan:  Decrease heparin to 900 units/hr  Check 8 hr heparin level  F/u daily heparin level, CBC stable Monitor for s/s of bleeding F/u transition to oral agent after thrombectomy  Onnie Boer, PharmD, BCIDP, AAHIVP, CPP Infectious Disease Pharmacist 05/06/2021 3:47 AM

## 2021-05-06 NOTE — Progress Notes (Addendum)
  Progress Note    05/06/2021 7:34 AM 2 Days Post-Op  Subjective:  no complaints   Vitals:   05/05/21 1956 05/06/21 0553  BP: (!) 102/58 107/60  Pulse: 86 77  Resp: 19 18  Temp: 98.5 F (36.9 C) 98.3 F (36.8 C)  SpO2: 100% 99%   Physical Exam: Cardiac:  regular Lungs:  non labored Extremities:  2+ l femoral pulse and left DP. Left popliteal access site c/d/I without swelling or hematoma. Left leg with some edema still present but improving Neurologic: alert  CBC    Component Value Date/Time   WBC 9.3 05/05/2021 0242   RBC 3.41 (L) 05/05/2021 0242   HGB 10.4 (L) 05/05/2021 0242   HGB 13.7 05/08/2019 1156   HCT 31.8 (L) 05/05/2021 0242   HCT 41.4 05/08/2019 1156   PLT 279 05/05/2021 0242   PLT 357 05/08/2019 1156   MCV 93.3 05/05/2021 0242   MCV 92 05/08/2019 1156   MCH 30.5 05/05/2021 0242   MCHC 32.7 05/05/2021 0242   RDW 13.9 05/05/2021 0242   RDW 12.5 05/08/2019 1156   LYMPHSABS 0.8 04/27/2021 0717   MONOABS 2.0 (H) 04/27/2021 0717   EOSABS 0.0 04/27/2021 0717   BASOSABS 0.1 04/27/2021 0717    BMET    Component Value Date/Time   NA 138 05/06/2021 0242   NA 142 05/08/2019 1156   K 3.5 05/06/2021 0242   CL 105 05/06/2021 0242   CO2 26 05/06/2021 0242   GLUCOSE 88 05/06/2021 0242   BUN <5 (L) 05/06/2021 0242   BUN 11 05/08/2019 1156   CREATININE 0.72 05/06/2021 0242   CALCIUM 9.0 05/06/2021 0242   GFRNONAA >60 05/06/2021 0242   GFRAA 72 05/08/2019 1156    INR    Component Value Date/Time   INR 1.60 (H) 02/08/2010 0631     Intake/Output Summary (Last 24 hours) at 05/06/2021 0734 Last data filed at 05/06/2021 0630 Gross per 24 hour  Intake 691.62 ml  Output 1550 ml  Net -858.38 ml     Assessment/Plan:  75 y.o. female is s/p mechanical thrombectomy of inferior vena cava, left common iliac, external iliac, common femoral, femoral, and popliteal veins and  Balloon venoplasty of the inferior vena cava, left common iliac, external iliac, common  femoral, femoral, popliteal vein  Left leg without pain. Improved edema. Dressings and popliteal suture removed yesterday. Order placed for thigh high compression stockings yesterday. These have not been delivered to patients room but she needs to start wearing them when they are available. Transition to oral anticoagulation. Okay for d/c from vascular standpoint. She is awaiting possible CIR admit. She will have follow up arranged in our office in 1 month with IVC/ LLE venous duplex  DVT prophylaxis:  iv Heparin   Karoline Caldwell, PA-C [facility] [phone] 05/06/2021 7:34 AM  I agree with the above.  OK for d/c.  1 month follow up.  Needs anticoagulation for 3-6 months  Wells Phillippe Orlick

## 2021-05-07 DIAGNOSIS — F0390 Unspecified dementia without behavioral disturbance: Secondary | ICD-10-CM | POA: Diagnosis not present

## 2021-05-07 DIAGNOSIS — R112 Nausea with vomiting, unspecified: Secondary | ICD-10-CM | POA: Diagnosis not present

## 2021-05-07 DIAGNOSIS — E440 Moderate protein-calorie malnutrition: Secondary | ICD-10-CM | POA: Diagnosis not present

## 2021-05-07 DIAGNOSIS — N179 Acute kidney failure, unspecified: Secondary | ICD-10-CM | POA: Diagnosis not present

## 2021-05-07 NOTE — Progress Notes (Addendum)
PROGRESS NOTE    Wendy Watts  ZCH:[id number] DOB: 07-11-1946 DOA: 04/26/2021 PCP: [name], MD    Brief Narrative:  75 y.o. female with history of advanced dementia who was recently admitted for right hip fracture discharged to rehab on March 30, 2021-brought to the ED on 9/12 for significant LLE swelling along with nausea/vomiting-patient was found to have extensive LLE DVT and AKI.  She was admitted to the hospitalist service-AKI was treated with supportive care-vascular surgery was consulted-patient subsequently underwent thrombectomy on 9/20.  See below for further details.  Subjective:  Pleasantly confused-asking for ice chips.  Objective: Vitals: Blood pressure 119/72, pulse 91, temperature 98.2 F (36.8 C), temperature source Oral, resp. rate 17, height 5\' 5"  (1.651 m), weight 64.6 kg, SpO2 100 %.   Exam: Gen Exam:Alert awake-not in any distress HEENT:atraumatic, normocephalic Chest: B/L clear to auscultation anteriorly CVS:S1S2 regular Abdomen:soft non tender, non distended Extremities: Minimal LLE edema. Neurology: Non focal Skin: no rash   Pertinent labs/radiology:    Doppler on 9/13: DVT involving left CFV, SF junction, left femoral vein, left proximal profunda vein, left popliteal vein, left posterior tibial vein, left peroneal vein, left gastrocnemius vein  Assessment & Plan: AKI: Hemodynamically mediated-due to nausea/vomiting-renal failure has resolved-creatinine back to baseline.    SIRS: Noninfectious etiology-likely from swelling/inflammation from extensive LLE DVT.  UA/CXR/blood cultures remain negative.  All antimicrobial therapy discontinued on 9/14.  Leukocytosis has normalized.  Nausea/vomiting: Resolved-could have had a transient viral syndrome.  CT abdomen negative for any acute abnormalities.  HIDA scan was negative for cholecystitis.  Tolerating advancement in diet.  Hypokalemia: Repleted.  Extensive LLE DVT s/p thrombectomy on 9/20:  Was on IV heparin-has been transitioned to Eliquis.  Suspect DVT was provoked by recent hip surgery.  Given significant clot burden-will require at least 6 months of anticoagulation.   Constipation: Significant stool burden seen on CT-given Dulcolax suppository with significant results-continue MiraLAX/senna.  BM on 9/22.  Recent right hip fracture-s/p ORIF on 8/14: Per last orthopedic note on 8/15-nonweightbearing for right lower extremity (but okay for touchdown weightbearing).  I will get in touch with orthopedics to see if she can now be full weightbearing.  Addendum: Per Ortho MD-Dr. Laverda Sorenson to be weightbearing as tolerated.  Dementia: Pleasantly confused-continue Aricept/Namenda/BuSpar.  Oral thrush: Improved-on nystatin.  Vitamin B12 deficiency: Started supplementation on 9/18.  Failure to thrive syndrome: Per patient's daughter-ever since she had recent hip fracture-her appetite has been poor-and has been failing to thrive syndrome.  Per nursing staff-continues to have poor oral intake-have started Remeron.  Debility/deconditioning: PT/OT evaluation in progress-recommendations are for [employer] has denied Media planner following with family for SNF placement..  Nutrition Status: Nutrition Problem: Moderate Malnutrition Etiology: chronic illness (dementia) Signs/Symptoms: mild fat depletion, moderate fat depletion, mild muscle depletion, moderate muscle depletion, energy intake < 75% for > or equal to 1 month Interventions: Boost Breeze, MVI, Prostat     DVT prophylaxis: Heparin gtt Code Status: Full Family Communication: Daughter-Shea-[phone]-updated over the phone on 9/23  Status is: Inpatient  Remains inpatient appropriate because:Inpatient level of care appropriate due to severity of illness  Dispo: The patient is from: Home              Anticipated d/c is to:  Unclear at this time              Patient currently is not medically stable to d/c.   Awaiting CIR bed.   Difficult to place patient No  Consultants:  Vascular Surgery  Procedures:     1.  Ultrasound-guided access, left popliteal vein    2.  IVUS (intravascular ultrasound, left popliteal, femoral, common femoral, external iliac, and common iliac veins and inferior vena cava            3.  Mechanical venous thrombectomy using the INARI device of the inferior vena cava, left common iliac, external iliac, common femoral, femoral, and popliteal veins            4.  Balloon venoplasty of the inferior vena cava, left common iliac, external iliac, common femoral, femoral, popliteal vein            5.  Venography of the left popliteal, femoral, common femoral, external iliac, and common iliac veins as well as the inferior vena cava  Antimicrobials: Anti-infectives (From admission, onward)    Start     Dose/Rate Route Frequency Ordered Stop   04/27/21 0000  ceFEPIme (MAXIPIME) 2 g in sodium chloride 0.9 % 100 mL IVPB  Status:  Discontinued        2 g 200 mL/hr over 30 Minutes Intravenous Every 24 hours 04/26/21 2128 04/28/21 1626   04/26/21 2200  metroNIDAZOLE (FLAGYL) IVPB 500 mg  Status:  Discontinued        500 mg 100 mL/hr over 60 Minutes Intravenous Every 12 hours 04/26/21 2124 04/28/21 1626   04/26/21 2130  ceFEPIme (MAXIPIME) 2 g in sodium chloride 0.9 % 100 mL IVPB  Status:  Discontinued        2 g 200 mL/hr over 30 Minutes Intravenous  Once 04/26/21 2124 04/28/21 1626       Filed Weights   04/27/21 2146 05/01/21 0400 05/03/21 0422  Weight: 69.8 kg 65.1 kg 64.6 kg    Data Reviewed: I have personally reviewed following labs and imaging studies  CBC: Recent Labs  Lab 05/01/21 0038 05/03/21 1019 05/04/21 0320 05/05/21 0242  WBC 12.4* 11.6* 9.6 9.3  HGB 11.0* 12.8 11.1* 10.4*  HCT 34.0* 38.9 33.6* 31.8*  MCV 94.2 93.5 92.8 93.3  PLT 292 255 347 096    Basic Metabolic Panel: Recent Labs  Lab 05/01/21 0038 05/04/21 0320 05/05/21 0242 05/06/21 0242   NA 139 134* 137 138  K 3.7 3.4* 3.5 3.5  CL 106 100 106 105  CO2 24 27 22 26   GLUCOSE 87 86 78 88  BUN 28* 10 7* <5*  CREATININE 1.05* 0.71 0.83 0.72  CALCIUM 10.1 9.5 8.9 9.0  MG  --   --  1.5* 2.0    GFR: Estimated Creatinine Clearance: 54.7 mL/min (by C-G formula based on SCr of 0.72 mg/dL). Liver Function Tests: No results for input(s): AST, ALT, ALKPHOS, BILITOT, PROT, ALBUMIN in the last 168 hours.  No results for input(s): LIPASE, AMYLASE in the last 168 hours.  No results for input(s): AMMONIA in the last 168 hours. Coagulation Profile: No results for input(s): INR, PROTIME in the last 168 hours. Cardiac Enzymes: No results for input(s): CKTOTAL, CKMB, CKMBINDEX, TROPONINI in the last 168 hours. BNP (last 3 results) No results for input(s): PROBNP in the last 8760 hours. HbA1C: No results for input(s): HGBA1C in the last 72 hours. CBG: No results for input(s): GLUCAP in the last 168 hours. Lipid Profile: No results for input(s): CHOL, HDL, LDLCALC, TRIG, CHOLHDL, LDLDIRECT in the last 72 hours. Thyroid Function Tests: No results for input(s): TSH, T4TOTAL, FREET4, T3FREE, THYROIDAB in the last 72 hours. Anemia Panel: No results  for input(s): VITAMINB12, FOLATE, FERRITIN, TIBC, IRON, RETICCTPCT in the last 72 hours.  Sepsis Labs: No results for input(s): PROCALCITON, LATICACIDVEN in the last 168 hours.   No results found for this or any previous visit (from the past 240 hour(s)).     Radiology Studies: No results found.  Scheduled Meds:  (feeding supplement) PROSource Plus  30 mL Oral BID BM   apixaban  5 mg Oral BID   busPIRone  10 mg Oral BID   cyanocobalamin  1,000 mcg Subcutaneous Q2000   donepezil  10 mg Oral QHS   feeding supplement  1 Container Oral TID BM   memantine  10 mg Oral BID   mirtazapine  7.5 mg Oral QHS   multivitamin with minerals  1 tablet Oral Daily   nystatin  5 mL Oral QID   polyethylene glycol  17 g Oral BID   senna  2 tablet  Oral QHS   sodium chloride flush  3 mL Intravenous Q12H   Continuous Infusions:  sodium chloride       LOS: 11 days   Oren Binet, MD [facility] Pager On Amion  If 7PM-7AM, please contact night-coverage 05/07/2021, 12:34 PM

## 2021-05-07 NOTE — Progress Notes (Signed)
Inpatient Rehabilitation Admissions Coordinator   Insurance has denied CIR admit. I have notified her daughter, [name], by phone and she is aware. She is requesting SW contact her about possible SNF placement. I have alerted acute team and TOC. We will sign off at this time.  Danne Baxter, RN, MSN Rehab Admissions Coordinator [phone] 05/07/2021 10:40 AM

## 2021-05-07 NOTE — NC FL2 (Signed)
[place] LEVEL OF CARE SCREENING TOOL     IDENTIFICATION  Patient Name: Wendy Watts Birthdate: 06/11/1946 Sex: female Admission Date (Current Location): 04/26/2021  [facility] and [place] Number:  Herbalist and Address:  [facility], [place] [address] [place], [place] [address]      Provider Number: [id number]  Attending Physician Name and Address:  Jonetta Osgood, MD  Relative Name and Phone Number:  [name] [phone]    Current Level of Care: Hospital Recommended Level of Care: [place] Prior Approval Number:    Date Approved/Denied:   PASRR Number: [id number]  Discharge Plan: SNF    Current Diagnoses: Patient Active Problem List   Diagnosis Date Noted   Malnutrition of moderate degree 04/28/2021   Acute renal failure (ARF) ([place]) 04/26/2021   SIRS (systemic inflammatory response syndrome) ([place]) 04/26/2021   Nausea & vomiting 04/26/2021   ARF (acute renal failure) ([place]) 04/26/2021   Dementia without behavioral disturbance ([place]) 05/08/2019    Orientation RESPIRATION BLADDER Height & Weight     Self  Normal Incontinent, External catheter (External Urinary Catheter) Weight: 142 lb 6.7 oz (64.6 kg) Height:  5\' 5"  (165.1 cm)  BEHAVIORAL SYMPTOMS/MOOD NEUROLOGICAL BOWEL NUTRITION STATUS      Incontinent Diet (Please see discharge summary)  AMBULATORY STATUS COMMUNICATION OF NEEDS Skin   Limited Assist Verbally Other (Comment) Eddie Dibbles)                       Personal Care Assistance Level of Assistance  Bathing, Feeding, Dressing Bathing Assistance: Limited assistance Feeding assistance: Limited assistance (Needs assist) Dressing Assistance: Limited assistance     Functional Limitations Info  Sight, Hearing, Speech Sight Info: Impaired Hearing Info: Impaired Speech Info: Adequate    SPECIAL CARE FACTORS FREQUENCY  PT (By licensed PT), OT (By licensed OT)     PT  Frequency: 5x min weekly OT Frequency: 5x min weekly            Contractures Contractures Info: Not present    Additional Factors Info  Code Status, Allergies, Psychotropic Code Status Info: FULL Allergies Info: Allegra (fexofenadine Hcl),Ativan (lorazepam),Azithromycin,Codeine,Doxycycline Hyclate,Penicillins,Trazodone And Nefazodone,Zyrtec (cetirizine) Psychotropic Info: busPIRone (BUSPAR) tablet 10 mg 2 times daily,mirtazapine (REMERON) tablet 7.5 mg daily at bedtime,         Current Medications (05/07/2021):  This is the current hospital active medication list Current Facility-Administered Medications  Medication Dose Route Frequency Provider Last Rate Last Admin   (feeding supplement) PROSource Plus liquid 30 mL  30 mL Oral BID BM Ghimire, Shanker M, MD   30 mL at 05/06/21 1346   0.9 %  sodium chloride infusion  250 mL Intravenous PRN Serafina Mitchell, MD       acetaminophen (TYLENOL) tablet 650 mg  650 mg Oral Q6H PRN Rise Patience, MD       Or   acetaminophen (TYLENOL) suppository 650 mg  650 mg Rectal Q6H PRN Rise Patience, MD       apixaban Arne Cleveland) tablet 5 mg  5 mg Oral BID Lyndee Leo, RPH   5 mg at 05/07/21 1212   bisacodyl (DULCOLAX) suppository 10 mg  10 mg Rectal Daily PRN Jonetta Osgood, MD       busPIRone (BUSPAR) tablet 10 mg  10 mg Oral BID Rise Patience, MD   10 mg at 05/07/21 1212   cyanocobalamin ((VITAMIN B-12)) injection 1,000 mcg  1,000  mcg Subcutaneous Q2000 Jonetta Osgood, MD   1,000 mcg at 05/06/21 1346   donepezil (ARICEPT) tablet 10 mg  10 mg Oral QHS Rise Patience, MD   10 mg at 05/06/21 2148   feeding supplement (BOOST / RESOURCE BREEZE) liquid 1 Container  1 Container Oral TID BM Jonetta Osgood, MD   1 Container at 05/06/21 2147   hydrALAZINE (APRESOLINE) injection 5 mg  5 mg Intravenous Q20 Min PRN Serafina Mitchell, MD       labetalol (NORMODYNE) injection 10 mg  10 mg Intravenous Q10 min PRN Serafina Mitchell, MD       memantine [facility]) tablet 10 mg  10 mg Oral BID Rise Patience, MD   10 mg at 05/06/21 2147   mirtazapine (REMERON) tablet 7.5 mg  7.5 mg Oral QHS Jonetta Osgood, MD   7.5 mg at 05/06/21 2147   multivitamin with minerals tablet 1 tablet  1 tablet Oral Daily Jonetta Osgood, MD   1 tablet at 05/06/21 1002   nystatin (MYCOSTATIN) 100000 UNIT/ML suspension 500,000 Units  5 mL Oral QID Jonetta Osgood, MD   500,000 Units at 05/06/21 2147   ondansetron (ZOFRAN) injection 4 mg  4 mg Intravenous Q6H PRN Serafina Mitchell, MD       polyethylene glycol (MIRALAX / GLYCOLAX) packet 17 g  17 g Oral BID Jonetta Osgood, MD   17 g at 05/06/21 2148   senna (SENOKOT) tablet 17.2 mg  2 tablet Oral QHS Jonetta Osgood, MD   17.2 mg at 05/06/21 2148   sodium chloride flush (NS) 0.9 % injection 3 mL  3 mL Intravenous Q12H Serafina Mitchell, MD   3 mL at 05/07/21 1048   sodium chloride flush (NS) 0.9 % injection 3 mL  3 mL Intravenous PRN Serafina Mitchell, MD         Discharge Medications: Please see discharge summary for a list of discharge medications.  Relevant Imaging Results:  Relevant Lab Results:   Additional Information [phone] Both Covid Vaccines and booster  Trula Ore, LCSWA

## 2021-05-07 NOTE — TOC Initial Note (Signed)
Transition of Care [facility]) - Initial/Assessment Note    Patient Details  Name: Wendy Watts MRN: [id number] Date of Birth: Jun 05, 1946  [facility]) CM/SW Contact:    Trula Ore, [place] Phone Number: 05/07/2021, 1:21 PM  Clinical Narrative:                  CSW spoke with patients daughter [name] who is interested in looking in to SNF placement for patient. Patient comes from home with spouse. [name] reports that patient is out of her days at [facility], but would like to still pursue to see what her options are and once bed offers received will discuss with facility about payment options. Patients daughter gave CSW permission to fax out initial referral near the [place] area. Two top choices for patient are [place]. Patient has received both covid vaccines as well as booster. CSW will continue to follow and assist with patients dc planning needs. Expected Discharge Plan: Skilled Nursing Facility Barriers to Discharge: Continued Medical Work up   Patient Goals and CMS Choice   CMS Medicare.gov Compare Post Acute Care list provided to:: Patient Represenative (must comment) (daughter [name]) Choice offered to / list presented to : Adult Children  Expected Discharge Plan and Services Expected Discharge Plan: Skilled Nursing Facility In-house Referral: Clinical Social Work Discharge Planning Services: CM Consult Post Acute Care Choice: [place] (would like SNF, however out of SNF days, and cannot pay copay out of pocket) Living arrangements for the past 2 months: [place]: [place] Date [place]: 05/02/21 Time [place]: 1222 Representative spoke with at [place]: [place] Arrangements/Services Living arrangements for the past 2 months: [place] with:: Self, Spouse Patient language and need for interpreter reviewed::  Yes Do you feel safe going back to the place where you live?: No   SNF  Need for Family Participation in Patient Care: Yes (Comment) Care giver support system in place?: Yes (comment) Current home services: DME Criminal Activity/Legal Involvement Pertinent to Current Situation/Hospitalization: No - Comment as needed  Activities of Daily Living Home Assistive Devices/Equipment: None ADL Screening (condition at time of admission) Patient's cognitive ability adequate to safely complete daily activities?: No Is the patient deaf or have difficulty hearing?: Yes Does the patient have difficulty seeing, even when wearing glasses/contacts?: Yes Does the patient have difficulty concentrating, remembering, or making decisions?: Yes Patient able to express need for assistance with ADLs?: Yes Does the patient have difficulty dressing or bathing?: Yes Independently performs ADLs?: No Communication: Independent Dressing (OT): Needs assistance, Dependent Is this a change from baseline?: Pre-admission baseline Grooming: Dependent, Needs assistance Is this a change from baseline?: Pre-admission baseline Feeding: Needs assistance, Dependent Is this a change from baseline?: Pre-admission baseline Bathing: Needs assistance, Dependent Is this a change from baseline?: Pre-admission baseline Toileting: Dependent, Needs assistance Is this a change from baseline?: Pre-admission baseline In/Out Bed: Dependent, Needs assistance Is this a change from baseline?: Pre-admission baseline Walks in Home: Dependent, Needs assistance Is this a change from baseline?: Pre-admission baseline Does the patient have difficulty walking or climbing stairs?: Yes Weakness of Legs: Both Weakness of Arms/Hands: None  Permission Sought/Granted Permission sought to share information with : Case Manager, Family Supports, Customer service manager  Permission granted to share information with : No  Share Information with  NAME: Patient only oriented to person spoke with patients daughter [name]  Permission granted to share info w AGENCY: Patient only oriented to person spoke with patients daughter [name] /SNF  Permission granted to share info w Relationship: Patient only oriented to person spoke with patients daughter [name]  Permission granted to share info w Contact Information: Patient only oriented to person spoke with patients daughter [name] [phone]  Emotional Assessment       Orientation: : Oriented to Self Alcohol / Substance Use: Not Applicable Psych Involvement: No (comment)  Admission diagnosis:  ARF (acute renal failure) ([place]) [N17.9] Nausea & vomiting [R11.2] Acute renal failure (ARF) ([place]) [N17.9] Patient Active Problem List   Diagnosis Date Noted   Malnutrition of moderate degree 04/28/2021   Acute renal failure (ARF) ([place]) 04/26/2021   SIRS (systemic inflammatory response syndrome) ([place]) 04/26/2021   Nausea & vomiting 04/26/2021   ARF (acute renal failure) ([place]) 04/26/2021   Dementia without behavioral disturbance ([place]) 05/08/2019   PCP:  [name], MD Pharmacy:   [facility] [place], [address] AT [address] [address] Phone: [phone] Fax: [phone]     Social Determinants of Health (SDOH) Interventions    Readmission Risk Interventions No flowsheet data found.

## 2021-05-07 NOTE — Progress Notes (Signed)
Physical Therapy Treatment Patient Details Name: Wendy Watts MRN: [id number] DOB: Feb 25, 1946 Today's Date: 05/07/2021   History of Present Illness Pt is a 75 y.o. female admitted from home on 04/26/21 with nausea, vomiting, LLE swelling. CXR with mild L basilar atelectasis. Found to have extensive LLE DVT, AKI, SIRS. S/p LLE DVT mechanical thrombectomy 9/20. PMH includes advanced dementia, recent R hip fx s/p ORIF (03/28/21 with d/c to SNF for ~20 days per husband), HOH, vertigo, anxiety.    PT Comments    Patient received in bed, quite confused but cooperative with therapy. Needed redirection frequently throughout session as she perseverates on "having already done that" and needing ice. Able to transfer to recliner but had a very difficult time getting her to adhere to 50% PWB precautions RLE when pivoting- honestly, would likely be safer with scooting transfers although this might be very difficult to teach given cognitive impairments. Left in recliner with all needs met, chair alarm active and nursing staff aware of pt status. Will continue efforts.     Recommendations for follow up therapy are one component of a multi-disciplinary discharge planning process, led by the attending physician.  Recommendations may be updated based on patient status, additional functional criteria and insurance authorization.  Follow Up Recommendations  SNF;Supervision/Assistance - 24 hour     Equipment Recommendations  Wheelchair (measurements PT);Wheelchair cushion (measurements PT);Hospital bed;3in1 (PT);Other (comment) (hoyer lift and pads)    Recommendations for Other Services       Precautions / Restrictions Precautions Precautions: Fall;Other (comment) Precaution Comments: pwb 50 lbs  R LE, cognitive deficits Restrictions Weight Bearing Restrictions: Yes RLE Weight Bearing: Partial weight bearing RLE Partial Weight Bearing Percentage or Pounds: 50%     Mobility  Bed Mobility Overal bed  mobility: Needs Assistance Bed Mobility: Supine to Sit     Supine to sit: Mod assist;HOB elevated     General bed mobility comments: ModA to bring legs over EOB and square up at side of bed    Transfers Overall transfer level: Needs assistance Equipment used: Rolling walker (2 wheeled) Transfers: Sit to/from [facility] Sit to Stand: Mod assist Stand pivot transfers: Mod assist;+2 physical assistance       General transfer comment: had to block R foot to decrease weight but through R LE with transfers, however very difficult to control her weight shifting when transferring. Not as impulsive with transfers today, but needed heavy physical assist to make sure she fully completed pivot to chair before sitting down  Ambulation/Gait         Gait velocity: Decreased   General Gait Details: deferred- unable to maintain 50% PWB R LE during transfers   Stairs             Wheelchair Mobility    Modified Rankin (Stroke Patients Only)       Balance Overall balance assessment: Needs assistance Sitting-balance support: Feet supported Sitting balance-Leahy Scale: Fair     Standing balance support: Bilateral upper extremity supported;During functional activity Standing balance-Leahy Scale: Poor Standing balance comment: reliant on UE support and external physical assistance                            Cognition Arousal/Alertness: Awake/alert Behavior During Therapy: Restless Overall Cognitive Status: History of cognitive impairments - at baseline  General Comments: hx of advanced dementia at baseline; perseverated on needing ice and needed lots of cues for task at hand and redirection. Not as impulsive today but very difficult to get her to adhere to 50% WB precautions R LE      Exercises      General Comments General comments (skin integrity, edema, etc.): insurance denied CIR- so plan is now  back to SNF      Pertinent Vitals/Pain Pain Assessment: Faces Faces Pain Scale: Hurts a little bit Pain Location: R leg Pain Descriptors / Indicators: Discomfort;Guarding;Moaning Pain Intervention(s): Limited activity within patient's tolerance;Monitored during session    Home Living                      Prior Function            PT Goals (current goals can now be found in the care plan section) Acute Rehab PT Goals Patient Stated Goal: to have ice PT Goal Formulation: With patient Time For Goal Achievement: 05/15/21 Potential to Achieve Goals: Fair Progress towards PT goals: Progressing toward goals    Frequency    Min 3X/week      PT Plan Discharge plan needs to be updated;Equipment recommendations need to be updated    Co-evaluation              AM-PAC PT "6 Clicks" Mobility   Outcome Measure  Help needed turning from your back to your side while in a flat bed without using bedrails?: A Little Help needed moving from lying on your back to sitting on the side of a flat bed without using bedrails?: A Lot Help needed moving to and from a bed to a chair (including a wheelchair)?: Total Help needed standing up from a chair using your arms (e.g., wheelchair or bedside chair)?: A Lot Help needed to walk in hospital room?: Total Help needed climbing 3-5 steps with a railing? : Total 6 Click Score: 10    End of Session Equipment Utilized During Treatment: Gait belt Activity Tolerance: Patient tolerated treatment well;Other (comment) (limited by cognition) Patient left: in chair;with call bell/phone within reach;with chair alarm set Nurse Communication: Mobility status PT Visit Diagnosis: Other abnormalities of gait and mobility (R26.89);Muscle weakness (generalized) (M62.81);Pain;Unsteadiness on feet (R26.81);Difficulty in walking, not elsewhere classified (R26.2) Pain - Right/Left: Right Pain - part of body: Leg     Time: 1610-9604 PT Time  Calculation (min) (ACUTE ONLY): 21 min  Charges:  $Therapeutic Activity: 8-22 mins                    Windell Norfolk, DPT, PN2   Supplemental Physical Therapist [place]    Pager [phone] Acute Rehab Office [phone]

## 2021-05-08 DIAGNOSIS — N179 Acute kidney failure, unspecified: Secondary | ICD-10-CM | POA: Diagnosis not present

## 2021-05-08 NOTE — Progress Notes (Signed)
PROGRESS NOTE    Wendy Watts  FXT:[id number] DOB: [birth year] DOA: 04/26/2021 PCP: [name], MD    Brief Narrative:  75 y.o. female with history of advanced dementia who was recently admitted for right hip fracture discharged to rehab on March 30, 2021-brought to the ED on 9/12 for significant LLE swelling along with nausea/vomiting-patient was found to have extensive LLE DVT and AKI.  She was admitted to the hospitalist service-AKI was treated with supportive care-vascular surgery was consulted-patient subsequently underwent thrombectomy on 9/20.  See below for further details.  Subjective:  Lying comfortably in bed-pleasantly confused  Objective: Vitals: Blood pressure 106/62, pulse 88, temperature 98.7 F (37.1 C), temperature source Axillary, resp. rate 16, height 5\' 5"  (1.651 m), weight 64.6 kg, SpO2 97 %.   Exam: Gen Exam: Confused pleasantly-not in any distress. HEENT:atraumatic, normocephalic Chest: B/L clear to auscultation anteriorly CVS:S1S2 regular Abdomen:soft non tender, non distended Extremities: Minimal LLE edema Neurology: Non focal Skin: no rash   Pertinent labs/radiology:    Doppler on 9/13: DVT involving left CFV, SF junction, left femoral vein, left proximal profunda vein, left popliteal vein, left posterior tibial vein, left peroneal vein, left gastrocnemius vein  Assessment & Plan: AKI: Hemodynamically mediated-due to nausea/vomiting-renal failure has resolved-creatinine back to baseline.    SIRS: Noninfectious etiology-likely from swelling/inflammation from extensive LLE DVT.  UA/CXR/blood cultures remain negative.  All antimicrobial therapy discontinued on 9/14.  Leukocytosis has normalized.  Nausea/vomiting: Resolved-could have had a transient viral syndrome.  CT abdomen negative for any acute abnormalities.  HIDA scan was negative for cholecystitis.  Tolerating diet but oral intake is erratic.  Hypokalemia: Repleted.  Extensive LLE DVT  s/p thrombectomy on 9/20: Was on IV heparin-has been transitioned to Eliquis.  Suspect DVT was provoked by recent hip surgery.  Given significant clot burden-will require at least 6 months of anticoagulation.   Constipation: Significant stool burden seen on CT-given Dulcolax suppository with significant results-continue MiraLAX/senna.  BM on 9/22.  Recent right hip fracture-s/p ORIF on 8/14: Per last orthopedic note on 8/15-nonweightbearing for right lower extremity (but okay for touchdown weightbearing).  I will get in touch with orthopedics to see if she can now be full weightbearing.  Addendum: Per Ortho MD-Dr. Laverda Sorenson to be weightbearing as tolerated.  Dementia: Pleasantly confused-continue Aricept/Namenda/BuSpar.  Oral thrush: Improved-on nystatin.  Vitamin B12 deficiency: Started supplementation on 9/18.  Failure to thrive syndrome: Per patient's daughter-ever since she had recent hip fracture-her appetite has been poor-and has been failing to thrive syndrome.  Per nursing staff-continues to have poor oral intake-have started Remeron.  Debility/deconditioning: PT/OT evaluation in progress-recommendations are for [employer] has denied Media planner following with family for SNF placement..  Nutrition Status: Nutrition Problem: Moderate Malnutrition Etiology: chronic illness (dementia) Signs/Symptoms: mild fat depletion, moderate fat depletion, mild muscle depletion, moderate muscle depletion, energy intake < 75% for > or equal to 1 month Interventions: Boost Breeze, MVI, Prostat     DVT prophylaxis: Heparin gtt Code Status: Full Family Communication: Daughter-Shea-[phone]-updated over the phone on 9/23  Status is: Inpatient  Remains inpatient appropriate because:Inpatient level of care appropriate due to severity of illness  Dispo: The patient is from: Home              Anticipated d/c is to: SNF              Patient currently is medically stable to  d/c.  Awaiting SNF bed.   Difficult to place patient No  Consultants:  Vascular Surgery  Procedures:     1.  Ultrasound-guided access, left popliteal vein    2.  IVUS (intravascular ultrasound, left popliteal, femoral, common femoral, external iliac, and common iliac veins and inferior vena cava            3.  Mechanical venous thrombectomy using the INARI device of the inferior vena cava, left common iliac, external iliac, common femoral, femoral, and popliteal veins            4.  Balloon venoplasty of the inferior vena cava, left common iliac, external iliac, common femoral, femoral, popliteal vein            5.  Venography of the left popliteal, femoral, common femoral, external iliac, and common iliac veins as well as the inferior vena cava  Antimicrobials: Anti-infectives (From admission, onward)    Start     Dose/Rate Route Frequency Ordered Stop   04/27/21 0000  ceFEPIme (MAXIPIME) 2 g in sodium chloride 0.9 % 100 mL IVPB  Status:  Discontinued        2 g 200 mL/hr over 30 Minutes Intravenous Every 24 hours 04/26/21 2128 04/28/21 1626   04/26/21 2200  metroNIDAZOLE (FLAGYL) IVPB 500 mg  Status:  Discontinued        500 mg 100 mL/hr over 60 Minutes Intravenous Every 12 hours 04/26/21 2124 04/28/21 1626   04/26/21 2130  ceFEPIme (MAXIPIME) 2 g in sodium chloride 0.9 % 100 mL IVPB  Status:  Discontinued        2 g 200 mL/hr over 30 Minutes Intravenous  Once 04/26/21 2124 04/28/21 1626       Filed Weights   04/27/21 2146 05/01/21 0400 05/03/21 0422  Weight: 69.8 kg 65.1 kg 64.6 kg    Data Reviewed: I have personally reviewed following labs and imaging studies  CBC: Recent Labs  Lab 05/03/21 1019 05/04/21 0320 05/05/21 0242  WBC 11.6* 9.6 9.3  HGB 12.8 11.1* 10.4*  HCT 38.9 33.6* 31.8*  MCV 93.5 92.8 93.3  PLT 255 347 867    Basic Metabolic Panel: Recent Labs  Lab 05/04/21 0320 05/05/21 0242 05/06/21 0242  NA 134* 137 138  K 3.4* 3.5 3.5  CL 100 106 105   CO2 27 22 26   GLUCOSE 86 78 88  BUN 10 7* <5*  CREATININE 0.71 0.83 0.72  CALCIUM 9.5 8.9 9.0  MG  --  1.5* 2.0    GFR: Estimated Creatinine Clearance: 54.7 mL/min (by C-G formula based on SCr of 0.72 mg/dL). Liver Function Tests: No results for input(s): AST, ALT, ALKPHOS, BILITOT, PROT, ALBUMIN in the last 168 hours.  No results for input(s): LIPASE, AMYLASE in the last 168 hours.  No results for input(s): AMMONIA in the last 168 hours. Coagulation Profile: No results for input(s): INR, PROTIME in the last 168 hours. Cardiac Enzymes: No results for input(s): CKTOTAL, CKMB, CKMBINDEX, TROPONINI in the last 168 hours. BNP (last 3 results) No results for input(s): PROBNP in the last 8760 hours. HbA1C: No results for input(s): HGBA1C in the last 72 hours. CBG: No results for input(s): GLUCAP in the last 168 hours. Lipid Profile: No results for input(s): CHOL, HDL, LDLCALC, TRIG, CHOLHDL, LDLDIRECT in the last 72 hours. Thyroid Function Tests: No results for input(s): TSH, T4TOTAL, FREET4, T3FREE, THYROIDAB in the last 72 hours. Anemia Panel: No results for input(s): VITAMINB12, FOLATE, FERRITIN, TIBC, IRON, RETICCTPCT in the last 72 hours.  Sepsis Labs: No results for input(s):  PROCALCITON, LATICACIDVEN in the last 168 hours.   No results found for this or any previous visit (from the past 240 hour(s)).     Radiology Studies: No results found.  Scheduled Meds:  (feeding supplement) PROSource Plus  30 mL Oral BID BM   apixaban  5 mg Oral BID   busPIRone  10 mg Oral BID   cyanocobalamin  1,000 mcg Subcutaneous Q2000   donepezil  10 mg Oral QHS   feeding supplement  1 Container Oral TID BM   memantine  10 mg Oral BID   mirtazapine  7.5 mg Oral QHS   multivitamin with minerals  1 tablet Oral Daily   nystatin  5 mL Oral QID   polyethylene glycol  17 g Oral BID   senna  2 tablet Oral QHS   sodium chloride flush  3 mL Intravenous Q12H   Continuous Infusions:   sodium chloride       LOS: 12 days   Oren Binet, MD [facility] Pager On Amion  If 7PM-7AM, please contact night-coverage 05/08/2021, 3:59 PM

## 2021-05-08 NOTE — TOC Progression Note (Signed)
Transition of Care [facility]) - Progression Note    Patient Details  Name: Wendy Watts MRN: [id number] Date of Birth: 10/18/1945  [facility] CM/SW [place], LCSW Phone Number:[phone] 05/08/2021, 2:00 PM  Clinical Narrative:     CSW spoke with [name]  to provide the 3 bed offers that have come back.  [name] explained that first choice is  [facility], second [facility] and third choice [facility]. CSW explained that none of them have made a bed offer yet.  [name] informed CSW that she followed up with [facility] and they confirmed that pt is will be eligible for 20 more days for SNF due to it being a new illness. CSW informed [name] that the information would be relayed.  TOC team will continue to assist with discharge planning needs.   Expected Discharge Plan: Skilled Nursing Facility Barriers to Discharge: Continued Medical Work up  Expected Discharge Plan and Services Expected Discharge Plan: [place] In-house Referral: Clinical Social Work Discharge Planning Services: CM Consult Post Acute Care Choice: [place] (would like SNF, however out of SNF days, and cannot pay copay out of pocket) Living arrangements for the past 2 months: [place]: [place] Date [place]: 05/02/21 Time [place]: 1222 Representative spoke with at [place]: Amy   Social Determinants of Health ([place]) Interventions    Readmission Risk Interventions No flowsheet data found.

## 2021-05-09 ENCOUNTER — Encounter (HOSPITAL_COMMUNITY): Payer: Self-pay | Admitting: Internal Medicine

## 2021-05-09 ENCOUNTER — Inpatient Hospital Stay (HOSPITAL_COMMUNITY): Payer: PPO

## 2021-05-09 DIAGNOSIS — N170 Acute kidney failure with tubular necrosis: Secondary | ICD-10-CM

## 2021-05-09 DIAGNOSIS — I82220 Acute embolism and thrombosis of inferior vena cava: Secondary | ICD-10-CM | POA: Diagnosis not present

## 2021-05-09 DIAGNOSIS — I455 Other specified heart block: Secondary | ICD-10-CM | POA: Diagnosis not present

## 2021-05-09 DIAGNOSIS — Z20822 Contact with and (suspected) exposure to covid-19: Secondary | ICD-10-CM | POA: Diagnosis not present

## 2021-05-09 DIAGNOSIS — R9431 Abnormal electrocardiogram [ECG] [EKG]: Secondary | ICD-10-CM

## 2021-05-09 DIAGNOSIS — B370 Candidal stomatitis: Secondary | ICD-10-CM | POA: Diagnosis not present

## 2021-05-09 DIAGNOSIS — I495 Sick sinus syndrome: Secondary | ICD-10-CM

## 2021-05-09 DIAGNOSIS — N179 Acute kidney failure, unspecified: Secondary | ICD-10-CM | POA: Diagnosis not present

## 2021-05-09 DIAGNOSIS — I82412 Acute embolism and thrombosis of left femoral vein: Secondary | ICD-10-CM | POA: Diagnosis not present

## 2021-05-09 LAB — CBC
HCT: 32.1 % — ABNORMAL LOW (ref 36.0–46.0)
Hemoglobin: 10.3 g/dL — ABNORMAL LOW (ref 12.0–15.0)
MCH: 29.9 pg (ref 26.0–34.0)
MCHC: 32.1 g/dL (ref 30.0–36.0)
MCV: 93.3 fL (ref 80.0–100.0)
Platelets: 258 10*3/uL (ref 150–400)
RBC: 3.44 MIL/uL — ABNORMAL LOW (ref 3.87–5.11)
RDW: 14.5 % (ref 11.5–15.5)
WBC: 6.9 10*3/uL (ref 4.0–10.5)
nRBC: 0 % (ref 0.0–0.2)

## 2021-05-09 LAB — BASIC METABOLIC PANEL
Anion gap: 7 (ref 5–15)
BUN: <5 mg/dL — ABNORMAL LOW (ref 8–23)
CO2: 27 mmol/L (ref 22–32)
Calcium: 9.3 mg/dL (ref 8.9–10.3)
Chloride: 105 mmol/L (ref 98–111)
Creatinine, Ser: 0.77 mg/dL (ref 0.44–1.00)
GFR, Estimated: >60 mL/min (ref >60)
Glucose, Bld: 89 mg/dL (ref 70–99)
Potassium: 3.5 mmol/L (ref 3.5–5.1)
Sodium: 139 mmol/L (ref 135–145)

## 2021-05-09 LAB — SURGICAL PCR SCREEN
MRSA, PCR: NEGATIVE
Staphylococcus aureus: NEGATIVE

## 2021-05-09 LAB — ECHOCARDIOGRAM COMPLETE
Area-P 1/2: 2.91 cm2
Height: 65 in
S' Lateral: 2.1 cm
Weight: 2278.67 oz

## 2021-05-09 MED ORDER — SODIUM CHLORIDE 0.9 % IV SOLN
80.0000 mg | INTRAVENOUS | Status: AC
  Administered 2021-05-10: 80 mg
  Filled 2021-05-09: qty 2

## 2021-05-09 MED ORDER — SODIUM CHLORIDE 0.9 % IV SOLN: INTRAVENOUS | Status: DC

## 2021-05-09 MED ORDER — VANCOMYCIN HCL IN DEXTROSE 1-5 GM/200ML-% IV SOLN
1000.0000 mg | INTRAVENOUS | Status: AC
  Administered 2021-05-10: 1000 mg via INTRAVENOUS
  Filled 2021-05-09: qty 200

## 2021-05-09 MED ORDER — SODIUM CHLORIDE 0.9% FLUSH: 3.0000 mL | INTRAVENOUS | Status: DC | PRN

## 2021-05-09 MED ORDER — SODIUM CHLORIDE 0.9 % IV SOLN: 250.0000 mL | INTRAVENOUS | Status: DC

## 2021-05-09 MED ORDER — SODIUM CHLORIDE 0.9% FLUSH
3.0000 mL | Freq: Two times a day (BID) | INTRAVENOUS | Status: DC
  Administered 2021-05-09 – 2021-05-11 (×4): 3 mL via INTRAVENOUS

## 2021-05-09 NOTE — Progress Notes (Signed)
  Echocardiogram 2D Echocardiogram has been performed.  Wendy Watts 05/09/2021, 10:59 AM

## 2021-05-09 NOTE — Progress Notes (Signed)
As I was doing vital signs Pt had an episode that  seems like a focal seizure. Pt jerked suddenly starting starring as she rolled her eyes backward. This episode lasted for over 60 seconds, looked more confused than her base line, attempted to climb out of bed , but gradually returned to her baseline and went back to sleep.

## 2021-05-09 NOTE — Progress Notes (Signed)
PROGRESS NOTE    Wendy Watts  VZC:[id number] DOB: 1946/07/21 DOA: 04/26/2021 PCP: [name], MD    Brief Narrative:  75 y.o. female with history of advanced dementia who was recently admitted for right hip fracture discharged to rehab on March 30, 2021-brought to the ED on 9/12 for significant LLE swelling along with nausea/vomiting-patient was found to have extensive LLE DVT and AKI.  She was admitted to the hospitalist service-AKI was treated with supportive care-vascular surgery was consulted-patient subsequently underwent thrombectomy on 9/20.  See below for further details.  Subjective:   Patient in bed, appears comfortable, denies any headache, no fever, no chest pain or pressure, no shortness of breath , no abdominal pain. No new focal weakness.   Objective: Vitals: Blood pressure 136/62, pulse 73, temperature 98.9 F (37.2 C), temperature source Oral, resp. rate 15, height 5\' 5"  (1.651 m), weight 64.6 kg, SpO2 93 %.   Exam:  Awake but confused, No new F.N deficits, Normal affect [place].AT,PERRAL Supple Neck,No JVD, No cervical lymphadenopathy appriciated.  Symmetrical Chest wall movement, Good air movement bilaterally, CTAB RRR,No Gallops, Rubs or new Murmurs, No Parasternal Heave +ve B.Sounds, Abd Soft, No tenderness, No organomegaly appriciated, No rebound - guarding or rigidity. No Cyanosis, Clubbing or edema, No new Rash or bruise   Pertinent labs/radiology:  Doppler on 9/13: DVT involving left CFV, SF junction, left femoral vein, left proximal profunda vein, left popliteal vein, left posterior tibial vein, left peroneal vein, left gastrocnemius vein.    Assessment & Plan:  > 10 sec Sinus Blocks x 2 - DC'd Aricept, asymptomatic, EP called.  AKI: Hemodynamically mediated-due to nausea/vomiting-renal failure has resolved-creatinine back to baseline.    SIRS: Noninfectious etiology-likely from swelling/inflammation from extensive LLE DVT.  UA/CXR/blood  cultures remain negative.  All antimicrobial therapy discontinued on 9/14.  Leukocytosis has normalized.  Nausea/vomiting: Resolved-could have had a transient viral syndrome.  CT abdomen negative for any acute abnormalities.  HIDA scan was negative for cholecystitis.  Tolerating diet but oral intake is erratic.  Hypokalemia: Repleted.  Extensive LLE DVT s/p thrombectomy on 9/20: Was on IV heparin-has been transitioned to Eliquis.  Suspect DVT was provoked by recent hip surgery.  Given significant clot burden-will require at least 6 months of anticoagulation, apply TEDs.  Constipation: Significant stool burden seen on CT-given Dulcolax suppository with significant results-continue MiraLAX/senna.  BM on 9/22.  Recent right hip fracture-s/p ORIF on 8/14: Per last orthopedic note on 8/15-nonweightbearing for right lower extremity (but okay for touchdown weightbearing). Follow with Ortho in 1 week post DC.  Addendum: Per Ortho MD-Dr. Laverda Sorenson to be weightbearing as tolerated.  Dementia: Pleasantly confused-continue Aricept/Namenda/BuSpar.  Oral thrush: Improved-on nystatin.  Vitamin B12 deficiency: Started supplementation on 9/18.  Failure to thrive syndrome: Per patient's daughter-ever since she had recent hip fracture-her appetite has been poor-and has been failing to thrive syndrome.  Per nursing staff-continues to have poor oral intake-have started Remeron.  Debility/deconditioning: PT/OT evaluation in progress-recommendations are for [employer] has denied Media planner following with family for SNF placement..  Nutrition Status: Nutrition Problem: Moderate Malnutrition Etiology: chronic illness (dementia) Signs/Symptoms: mild fat depletion, moderate fat depletion, mild muscle depletion, moderate muscle depletion, energy intake < 75% for > or equal to 1 month Interventions: Boost Breeze, MVI, Prostat     DVT prophylaxis: Heparin gtt Code Status: Full Family  Communication: Daughter - [phone]-updated over the phone on 05/09/21  Status is: Inpatient  Remains inpatient appropriate because:Inpatient level of care appropriate  due to severity of illness  Dispo: The patient is from: Home              Anticipated d/c is to: SNF              Patient currently is medically stable to d/c.  Awaiting SNF bed.   Difficult to place patient No    Consultants:  Vascular Surgery EP  Procedures:      1.  Ultrasound-guided access, left popliteal vein    2.  IVUS (intravascular ultrasound, left popliteal, femoral, common femoral, external iliac, and common iliac veins and inferior vena cava            3.  Mechanical venous thrombectomy using the INARI device of the inferior vena cava, left common iliac, external iliac, common femoral, femoral, and popliteal veins            4.  Balloon venoplasty of the inferior vena cava, left common iliac, external iliac, common femoral, femoral, popliteal vein            5.  Venography of the left popliteal, femoral, common femoral, external iliac, and common iliac veins as well as the inferior vena cava   Antimicrobials:  Anti-infectives (From admission, onward)    Start     Dose/Rate Route Frequency Ordered Stop   04/27/21 0000  ceFEPIme (MAXIPIME) 2 g in sodium chloride 0.9 % 100 mL IVPB  Status:  Discontinued        2 g 200 mL/hr over 30 Minutes Intravenous Every 24 hours 04/26/21 2128 04/28/21 1626   04/26/21 2200  metroNIDAZOLE (FLAGYL) IVPB 500 mg  Status:  Discontinued        500 mg 100 mL/hr over 60 Minutes Intravenous Every 12 hours 04/26/21 2124 04/28/21 1626   04/26/21 2130  ceFEPIme (MAXIPIME) 2 g in sodium chloride 0.9 % 100 mL IVPB  Status:  Discontinued        2 g 200 mL/hr over 30 Minutes Intravenous  Once 04/26/21 2124 04/28/21 1626       Filed Weights   04/27/21 2146 05/01/21 0400 05/03/21 0422  Weight: 69.8 kg 65.1 kg 64.6 kg    Data Reviewed: I have personally reviewed  following labs and imaging studies  CBC: Recent Labs  Lab 05/03/21 1019 05/04/21 0320 05/05/21 0242 05/09/21 0149  WBC 11.6* 9.6 9.3 6.9  HGB 12.8 11.1* 10.4* 10.3*  HCT 38.9 33.6* 31.8* 32.1*  MCV 93.5 92.8 93.3 93.3  PLT 255 347 279 564   Basic Metabolic Panel: Recent Labs  Lab 05/04/21 0320 05/05/21 0242 05/06/21 0242 05/09/21 0149  NA 134* 137 138 139  K 3.4* 3.5 3.5 3.5  CL 100 106 105 105  CO2 27 22 26 27   GLUCOSE 86 78 88 89  BUN 10 7* <5* <5*  CREATININE 0.71 0.83 0.72 0.77  CALCIUM 9.5 8.9 9.0 9.3  MG  --  1.5* 2.0  --    GFR: Estimated Creatinine Clearance: 54.7 mL/min (by C-G formula based on SCr of 0.77 mg/dL). Liver Function Tests: No results for input(s): AST, ALT, ALKPHOS, BILITOT, PROT, ALBUMIN in the last 168 hours.  No results for input(s): LIPASE, AMYLASE in the last 168 hours.  No results for input(s): AMMONIA in the last 168 hours. Coagulation Profile: No results for input(s): INR, PROTIME in the last 168 hours. Cardiac Enzymes: No results for input(s): CKTOTAL, CKMB, CKMBINDEX, TROPONINI in the last 168 hours. BNP (last 3 results) No results for input(s):  PROBNP in the last 8760 hours. HbA1C: No results for input(s): HGBA1C in the last 72 hours. CBG: No results for input(s): GLUCAP in the last 168 hours. Lipid Profile: No results for input(s): CHOL, HDL, LDLCALC, TRIG, CHOLHDL, LDLDIRECT in the last 72 hours. Thyroid Function Tests: No results for input(s): TSH, T4TOTAL, FREET4, T3FREE, THYROIDAB in the last 72 hours. Anemia Panel: No results for input(s): VITAMINB12, FOLATE, FERRITIN, TIBC, IRON, RETICCTPCT in the last 72 hours.  Sepsis Labs: No results for input(s): PROCALCITON, LATICACIDVEN in the last 168 hours.   No results found for this or any previous visit (from the past 240 hour(s)).     Radiology Studies: No results found.  Scheduled Meds:  (feeding supplement) PROSource Plus  30 mL Oral BID BM   apixaban  5 mg  Oral BID   busPIRone  10 mg Oral BID   cyanocobalamin  1,000 mcg Subcutaneous Q2000   feeding supplement  1 Container Oral TID BM   memantine  10 mg Oral BID   mirtazapine  7.5 mg Oral QHS   multivitamin with minerals  1 tablet Oral Daily   polyethylene glycol  17 g Oral BID   senna  2 tablet Oral QHS   Continuous Infusions:  sodium chloride       LOS: 13 days   Signature  Lala Lund M.D on 05/09/2021 at 9:21 AM   -  To page go to [email or web address]

## 2021-05-09 NOTE — Consult Note (Addendum)
Cardiology Consultation:   Patient ID: LAIYAH EXLINE MRN: [id number]; DOB: [birth year]  Admit date: 04/26/2021 Date of Consult: 05/09/2021  PCP:  [name], MD   Rachel Providers Cardiologist:  None   {   Patient Profile:   Wendy Watts is a 75 y.o. female with a hx of advanced dementia, recent Rt hip fx,  admitted 04/26/21 with lower ext swelling, who is being seen 05/09/2021 for the evaluation of long symptomatic pauses at the request of Dr. Candiss Norse.  History of Present Illness:   Wendy Watts with hx of advanced dementia was admitted 04/26/21 with swollen leg post rt hip surgery for fx.  + lt leg DVT, with mechanical venous thrombectomy of the infra vena cava, left common iliac, external iliac, common femoral, femoral, and popliteal veins using the INARI device. On 05/04/21.  Also with balloon venoplasty using a 12 mm balloon.  Plan for anticoag for 3-6 months due to provoked DVT.    This morning at 4:47am, patient with an almost 19 second pause with 1-2 escape beats. During this period her "eyes rolled back in her head" and was unresponsive.  This mornign I spoke with the patient's daughter, Wendy Watts [phone]). She tells me that the hip fracture occurred when she suddenly fell while walking. No clear prodrome. She has had other falls that were sudden and not obviously mechanical. Unclear if a pause in the rhythm could have contributed.    Past Medical History:  Diagnosis Date   Anxiety    Dementia ([place])    High cholesterol    Vertigo     Past Surgical History:  Procedure Laterality Date   HYSTERECTOMY     INTRAMEDULLARY (IM) NAIL INTERTROCHANTERIC Right 03/28/2021   Procedure: INTRAMEDULLARY (IM) NAIL INTERTROCHANTRIC;  Surgeon: Meredith Pel, MD;  Location: [facility];  Service: Orthopedics;  Laterality: Right;   PERIPHERAL VASCULAR THROMBECTOMY N/A 05/04/2021   Procedure: PERIPHERAL VASCULAR THROMBECTOMY - VENUS;  Surgeon: Serafina Mitchell, MD;  Location: [place] [facility];  Service: Cardiovascular;  Laterality: N/A;   TOTAL KNEE ARTHROPLASTY Right      Home Medications:  Prior to Admission medications   Medication Sig Start Date End Date Taking? Authorizing Provider  busPIRone (BUSPAR) 10 MG tablet Take 10 mg by mouth 2 (two) times daily. 02/11/21  Yes [provider]  donepezil (ARICEPT) 10 MG tablet TAKE 1 TABLET(10 MG) BY MOUTH AT BEDTIME Patient taking differently: Take 10 mg by mouth at bedtime. 02/17/21  Yes Lomax, Amy, NP  fluticasone (FLONASE) 50 MCG/ACT nasal spray Place 2 sprays into both nostrils daily as needed for allergies or rhinitis.   Yes [provider]  memantine (NAMENDA) 10 MG tablet Take 1 tablet (10 mg total) by mouth 2 (two) times daily. 02/16/21  Yes Lomax, Amy, NP  methocarbamol (ROBAXIN) 500 MG tablet Take 1 tablet (500 mg total) by mouth every 8 (eight) hours as needed for muscle spasms. 03/30/21  Yes [place] Eric J, DO  traMADol (ULTRAM) 50 MG tablet Take 1 tablet (50 mg total) by mouth every 6 (six) hours as needed for moderate pain. 03/30/21  Yes [place] Eric J, DO  VITAMIN D PO Take 5,000 Units by mouth daily.   Yes [provider]  Wheat Dextrin (BENEFIBER DRINK MIX) PACK Take 1 Scoop by mouth daily. Mix in applesauce   Yes [provider]  enoxaparin (LOVENOX) 40 MG/0.4ML injection Inject 0.4 mLs (40 mg total) into the skin daily. 03/30/21 04/29/21  [place] Eric J, DO    Inpatient Medications: Scheduled Meds:  (feeding supplement) PROSource Plus  30 mL Oral BID BM   apixaban  5 mg Oral BID   busPIRone  10 mg Oral BID   cyanocobalamin  1,000 mcg Subcutaneous Q2000   feeding supplement  1 Container Oral TID BM   memantine  10 mg Oral BID   mirtazapine  7.5 mg Oral QHS   multivitamin with minerals  1 tablet Oral Daily   polyethylene glycol  17 g Oral BID   senna  2 tablet Oral QHS   Continuous Infusions:  sodium chloride     PRN Meds: sodium chloride, acetaminophen **OR**  acetaminophen, bisacodyl, hydrALAZINE, ondansetron (ZOFRAN) IV, sodium chloride flush  Allergies:    Allergies  Allergen Reactions   Allegra [Fexofenadine Hcl] Other (See Comments)    "takes me to another world"   Ativan [Lorazepam] Other (See Comments)    Hallucinations   Azithromycin Nausea And Vomiting   Codeine Hives   Doxycycline Hyclate Nausea Only   Penicillins Hives    Has patient had a PCN reaction causing immediate rash, facial/tongue/throat swelling, SOB or lightheadedness with hypotension: no Has patient had a PCN reaction causing severe rash involving mucus membranes or skin necrosis: no Has patient had a PCN reaction that required hospitalization: no Has patient had a PCN reaction occurring within the last 10 years: no If all of the above answers are "NO", then may proceed with Cephalosporin use.    Trazodone And Nefazodone Nausea Only   Zyrtec [Cetirizine] Other (See Comments)    "takes me to another world"    Social History:   Social History   Socioeconomic History   Marital status: Married    Spouse name: Not on file   Number of children: 2   Years of education: Not on file   Highest education level: High school graduate  Occupational History   Not on file  Tobacco Use   Smoking status: Never   Smokeless tobacco: Never  Substance and Sexual Activity   Alcohol use: Never   Drug use: Never   Sexual activity: Not on file  Other Topics Concern   Not on file  Social History Narrative   LIves at home with husband and son   Social Determinants of Health   Financial Resource Strain: Not on file  Food Insecurity: Not on file  Transportation Needs: Not on file  Physical Activity: Not on file  Stress: Not on file  Social Connections: Not on file  Intimate Partner Violence: Not on file    Family History:    Family History  Problem Relation Age of Onset   Dementia Neg Hx    Alzheimer's disease Neg Hx      ROS:  Please see the history of present  illness.   All other ROS reviewed and negative.     Physical Exam/Data:   Vitals:   05/08/21 0434 05/08/21 1456 05/08/21 2017 05/09/21 0455  BP: (!) 119/48 106/62 (!) 137/58 136/62  Pulse: 71 88 85 73  Resp: 14 16 19 15   Temp: 98.6 F (37 C) 98.7 F (37.1 C) 98.7 F (37.1 C) 98.9 F (37.2 C)  TempSrc: Axillary Axillary Axillary Oral  SpO2: 96% 97% 98% 93%  Weight:      Height:        Intake/Output Summary (Last 24 hours) at 05/09/2021 0756 Last data filed at 05/08/2021 2117 Gross per 24 hour  Intake 640 ml  Output  250 ml  Net 390 ml   Last 3 Weights 05/03/2021 05/01/2021 04/27/2021  Weight (lbs) 142 lb 6.7 oz 143 lb 8.3 oz 153 lb 14.1 oz  Weight (kg) 64.6 kg 65.1 kg 69.8 kg     Body mass index is 23.7 kg/m.    General:  In no acute distress HEENT: normal Neck: no JVD Vascular: No carotid bruits; Distal pulses 2+ bilaterally Cardiac:  normal S1, S2; RRR; no murmur  Lungs:  clear to auscultation bilaterally, no wheezing, rhonchi or rales  Abd: soft, nontender, no hepatomegaly  Ext: LLE edema Musculoskeletal:  No deformities, LLE edema Skin: warm and dry  Neuro:  no focal abnormalities noted.  Psych:  Normal affect   EKG:  The EKG was personally reviewed and demonstrates:  sinus rhythm. Normal intervals.  Telemetry:  Telemetry was personally reviewed and demonstrates:  4:47am on 05/09/2021 patient with 19 second pause with 3 escape beats.  Relevant CV Studies:  Echo pending  Laboratory Data:  High Sensitivity Troponin:  No results for input(s): TROPONINIHS in the last 720 hours.   Chemistry Recent Labs  Lab 05/05/21 0242 05/06/21 0242 05/09/21 0149  NA 137 138 139  K 3.5 3.5 3.5  CL 106 105 105  CO2 22 26 27   GLUCOSE 78 88 89  BUN 7* <5* <5*  CREATININE 0.83 0.72 0.77  CALCIUM 8.9 9.0 9.3  MG 1.5* 2.0  --   GFRNONAA >60 >60 >60  ANIONGAP 9 7 7     No results for input(s): PROT, ALBUMIN, AST, ALT, ALKPHOS, BILITOT in the last 168 hours. Lipids No  results for input(s): CHOL, TRIG, HDL, LABVLDL, LDLCALC, CHOLHDL in the last 168 hours.  Hematology Recent Labs  Lab 05/04/21 0320 05/05/21 0242 05/09/21 0149  WBC 9.6 9.3 6.9  RBC 3.62* 3.41* 3.44*  HGB 11.1* 10.4* 10.3*  HCT 33.6* 31.8* 32.1*  MCV 92.8 93.3 93.3  MCH 30.7 30.5 29.9  MCHC 33.0 32.7 32.1  RDW 13.7 13.9 14.5  PLT 347 279 258   Thyroid No results for input(s): TSH, FREET4 in the last 168 hours.  BNPNo results for input(s): BNP, PROBNP in the last 168 hours.  DDimer No results for input(s): DDIMER in the last 168 hours.   Radiology/Studies:  No results found.   Assessment and Plan:  75yo woman with dementia who suffered a recent hip fracture now s/p ORIF who presented from rehab with lower extremity swelling found to have extensive DVT and AKI. Vascular surgery has performed extensive thrombectomy and venoplasty (05/04/2021). Last night, patient with prolonged symptomatic sinus pause lasting 19 seconds. No evidence of significant conduction disease on her baseline ECG.  Sinus pause, symptomatic. She will benefit from a pacemaker. She would be a good candidate for Micra implant but given thrombectomy and venoplasty I would like to avoid extensive instrumentation of her IVC with the micra sheath. Will plan for a traditional DDD PPM (biotronik for CLS).  Please keep NPO after MN.  Did discuss the pacemaker procedure in detail with the patient's daughter, Wendy Watts. Discussed the risks, recovery.  Risks, benefits, alternatives to PPM implantation were discussed in detail with the patient's daughter today. The patient understands that the risks include but are not limited to bleeding, infection, pneumothorax, perforation, tamponade, vascular damage, renal failure, MI, stroke, death, and lead dislodgement and wishes to proceed.  We will therefore schedule device implantation at the next available time.  Needs echo.     For questions or updates, please contact [facility] Please consult [email or web address] for contact info under    [employer]. [name], MD, [facility], [facility] Cardiac Electrophysiology

## 2021-05-09 NOTE — Progress Notes (Signed)
HOSPITAL MEDICINE OVERNIGHT EVENT NOTE    Contacted by nursing due to concern over approximately a 1 minute episode of "eyes rolled in the back of the head" with unresponsiveness.  During this episode, there are several prolonged cardiac pauses on telemetry.  Nursing reports that patient is now back to baseline.  Patient is currently hemodynamically stable.  I requested that we telemetry strips during this episode be uploaded so that I can review.  Chart reviewed, no AV nodal blocking agents are being administered to this patient.  Obtaining repeat EKG.  Telemetry reviewed revealing multiple pauses as long as 14.72 seconds.  EP involvement may be necessary.  I discussed this episode and the telemetry strips with Dr. Candiss Norse who is taking over care of this patient this morning.  Continue to monitor patient closely on telemetry.  Wendy Emerald  MD [facility]

## 2021-05-10 ENCOUNTER — Inpatient Hospital Stay (HOSPITAL_COMMUNITY)
Admission: EM | Disposition: A | Discharge status: Home Health | Payer: Self-pay | Source: Home / Self Care | Attending: Internal Medicine

## 2021-05-10 ENCOUNTER — Telehealth: Payer: Self-pay | Admitting: Orthopedic Surgery

## 2021-05-10 ENCOUNTER — Encounter (HOSPITAL_COMMUNITY)
Admission: EM | Disposition: A | Discharge status: Home Health | Payer: Self-pay | Source: Home / Self Care | Attending: Internal Medicine

## 2021-05-10 DIAGNOSIS — I82220 Acute embolism and thrombosis of inferior vena cava: Secondary | ICD-10-CM | POA: Diagnosis not present

## 2021-05-10 DIAGNOSIS — Z20822 Contact with and (suspected) exposure to covid-19: Secondary | ICD-10-CM | POA: Diagnosis not present

## 2021-05-10 DIAGNOSIS — I82412 Acute embolism and thrombosis of left femoral vein: Secondary | ICD-10-CM | POA: Diagnosis not present

## 2021-05-10 DIAGNOSIS — I495 Sick sinus syndrome: Secondary | ICD-10-CM | POA: Diagnosis not present

## 2021-05-10 DIAGNOSIS — B370 Candidal stomatitis: Secondary | ICD-10-CM | POA: Diagnosis not present

## 2021-05-10 HISTORY — PX: PACEMAKER IMPLANT: EP1218

## 2021-05-10 LAB — CBC
HCT: 33.2 % — ABNORMAL LOW (ref 36.0–46.0)
Hemoglobin: 10.7 g/dL — ABNORMAL LOW (ref 12.0–15.0)
MCH: 30.4 pg (ref 26.0–34.0)
MCHC: 32.2 g/dL (ref 30.0–36.0)
MCV: 94.3 fL (ref 80.0–100.0)
Platelets: 284 10*3/uL (ref 150–400)
RBC: 3.52 MIL/uL — ABNORMAL LOW (ref 3.87–5.11)
RDW: 14.4 % (ref 11.5–15.5)
WBC: 6 10*3/uL (ref 4.0–10.5)
nRBC: 0 % (ref 0.0–0.2)

## 2021-05-10 LAB — BASIC METABOLIC PANEL
Anion gap: 6 (ref 5–15)
BUN: 9 mg/dL (ref 8–23)
CO2: 26 mmol/L (ref 22–32)
Calcium: 9.5 mg/dL (ref 8.9–10.3)
Chloride: 106 mmol/L (ref 98–111)
Creatinine, Ser: 0.82 mg/dL (ref 0.44–1.00)
GFR, Estimated: >60 mL/min (ref >60)
Glucose, Bld: 91 mg/dL (ref 70–99)
Potassium: 3.6 mmol/L (ref 3.5–5.1)
Sodium: 138 mmol/L (ref 135–145)

## 2021-05-10 LAB — MAGNESIUM: Magnesium: 1.8 mg/dL (ref 1.7–2.4)

## 2021-05-10 LAB — PROTIME-INR
INR: 1.5 — ABNORMAL HIGH (ref 0.8–1.2)
Prothrombin Time: 18.5 seconds — ABNORMAL HIGH (ref 11.4–15.2)

## 2021-05-10 SURGERY — PACEMAKER IMPLANT

## 2021-05-10 MED ORDER — SODIUM CHLORIDE 0.9 % IV SOLN
INTRAVENOUS | Status: AC
  Filled 2021-05-10: qty 2

## 2021-05-10 MED ORDER — HEPARIN (PORCINE) IN NACL 1000-0.9 UT/500ML-% IV SOLN
INTRAVENOUS | Status: DC | PRN
  Administered 2021-05-10: 500 mL

## 2021-05-10 MED ORDER — LIDOCAINE HCL 1 % IJ SOLN
INTRAMUSCULAR | Status: AC
  Filled 2021-05-10: qty 60

## 2021-05-10 MED ORDER — LIDOCAINE HCL (PF) 1 % IJ SOLN
INTRAMUSCULAR | Status: DC | PRN
  Administered 2021-05-10: 50 mL

## 2021-05-10 MED ORDER — APIXABAN 5 MG PO TABS
5.0000 mg | ORAL_TABLET | Freq: Two times a day (BID) | ORAL | Status: DC
  Filled 2021-05-10: qty 1

## 2021-05-10 MED ORDER — VANCOMYCIN HCL IN DEXTROSE 1-5 GM/200ML-% IV SOLN
INTRAVENOUS | Status: AC
  Filled 2021-05-10: qty 200

## 2021-05-10 SURGICAL SUPPLY — 7 items
CABLE SURGICAL S-101-97-12 (CABLE) ×2 IMPLANT
LEAD SOLIA S PRO MRI 53 (Lead) ×1 IMPLANT
PACEMAKER EDORA SR-T MRI (Pacemaker) ×1 IMPLANT
PAD PRO RADIOLUCENT 2001M-C (PAD) ×2 IMPLANT
SHEATH 7FR PRELUDE SNAP 13 (SHEATH) ×2 IMPLANT
SHEATH PROBE COVER 6X72 (BAG) ×1 IMPLANT
TRAY PACEMAKER INSERTION (PACKS) ×2 IMPLANT

## 2021-05-10 NOTE — Plan of Care (Signed)
  Problem: Education: Goal: Knowledge of General Education information will improve Description Including pain rating scale, medication(s)/side effects and non-pharmacologic comfort measures Outcome: Progressing   

## 2021-05-10 NOTE — Progress Notes (Signed)
Progress Note  Patient Name: Wendy Watts Date of Encounter: 05/10/2021  [facility] Cardiologist: New to [facility]   Subjective   No new c/o  Inpatient Medications    Scheduled Meds:  (feeding supplement) PROSource Plus  30 mL Oral BID BM   apixaban  5 mg Oral BID   busPIRone  10 mg Oral BID   cyanocobalamin  1,000 mcg Subcutaneous Q2000   feeding supplement  1 Container Oral TID BM   gentamicin irrigation  80 mg Irrigation On Call   memantine  10 mg Oral BID   mirtazapine  7.5 mg Oral QHS   multivitamin with minerals  1 tablet Oral Daily   polyethylene glycol  17 g Oral BID   senna  2 tablet Oral QHS   sodium chloride flush  3 mL Intravenous Q12H   Continuous Infusions:  sodium chloride     sodium chloride 50 mL/hr at 05/10/21 0616   sodium chloride     vancomycin     PRN Meds: sodium chloride, acetaminophen **OR** acetaminophen, bisacodyl, hydrALAZINE, ondansetron (ZOFRAN) IV, sodium chloride flush, sodium chloride flush   Vital Signs    Vitals:   05/09/21 0455 05/09/21 1451 05/09/21 2035 05/10/21 0512  BP: 136/62 104/68 106/62 (!) 108/58  Pulse: 73 75 81 67  Resp: 15 17 18 14   Temp: 98.9 F (37.2 C) 98.3 F (36.8 C) 99 F (37.2 C) 98.6 F (37 C)  TempSrc: Oral Oral Oral Oral  SpO2: 93% 97% 98% 96%  Weight:      Height:        Intake/Output Summary (Last 24 hours) at 05/10/2021 0812 Last data filed at 05/10/2021 0616 Gross per 24 hour  Intake --  Output 250 ml  Net -250 ml   Last 3 Weights 05/03/2021 05/01/2021 04/27/2021  Weight (lbs) 142 lb 6.7 oz 143 lb 8.3 oz 153 lb 14.1 oz  Weight (kg) 64.6 kg 65.1 kg 69.8 kg      Telemetry    SR 60's-70's - Personally Reviewed  ECG    No new EKGs - Personally Reviewed  Physical Exam   GEN: No acute distress.   Neck: No JVD Cardiac: RRR, no murmurs, rubs, or gallops.  Respiratory: CTA b/l. GI: Soft, nontender, non-distended  MS: No edema; age appropriate atrophy. Neuro:  Nonfocal  Psych: Normal  affect   Labs    High Sensitivity Troponin:  No results for input(s): TROPONINIHS in the last 720 hours.   Chemistry Recent Labs  Lab 05/05/21 0242 05/06/21 0242 05/09/21 0149 05/10/21 0124  NA 137 138 139 138  K 3.5 3.5 3.5 3.6  CL 106 105 105 106  CO2 22 26 27 26   GLUCOSE 78 88 89 91  BUN 7* <5* <5* 9  CREATININE 0.83 0.72 0.77 0.82  CALCIUM 8.9 9.0 9.3 9.5  MG 1.5* 2.0  --  1.8  GFRNONAA >60 >60 >60 >60  ANIONGAP 9 7 7 6     Lipids No results for input(s): CHOL, TRIG, HDL, LABVLDL, LDLCALC, CHOLHDL in the last 168 hours.  Hematology Recent Labs  Lab 05/05/21 0242 05/09/21 0149 05/10/21 0124  WBC 9.3 6.9 6.0  RBC 3.41* 3.44* 3.52*  HGB 10.4* 10.3* 10.7*  HCT 31.8* 32.1* 33.2*  MCV 93.3 93.3 94.3  MCH 30.5 29.9 30.4  MCHC 32.7 32.1 32.2  RDW 13.9 14.5 14.4  PLT 279 258 284   Thyroid No results for input(s): TSH, FREET4 in the last 168 hours.  BNPNo  results for input(s): BNP, PROBNP in the last 168 hours.  DDimer No results for input(s): DDIMER in the last 168 hours.   Radiology     Cardiac Studies   05/09/21: TTE IMPRESSIONS   1. Left ventricular ejection fraction, by estimation, is 70 to 75%. The  left ventricle has hyperdynamic function. The left ventricle has no  regional wall motion abnormalities. Left ventricular diastolic parameters  are consistent with Grade I diastolic  dysfunction (impaired relaxation).   2. Right ventricular systolic function is normal. The right ventricular  size is normal.   3. The mitral valve is normal in structure. No evidence of mitral valve  regurgitation. No evidence of mitral stenosis.   4. The aortic valve is normal in structure. Aortic valve regurgitation is  not visualized. No aortic stenosis is present.   5. The inferior vena cava is normal in size with greater than 50%  respiratory variability, suggesting right atrial pressure of 3 mmHg.   Patient Profile     75 y.o. female w/PMHx of advanced dementia,  HLD.  She was recently admitted with a fall and R hip fx, discharged to rehab 04/30/21.  Developed LLE swelling, N/V, found with extensive LLE DVT and AI >> vascular surgery s/p intervention (as below) > heparin > Eliquis  Developed prolonged pause/asystolic event, symptomatic and EP consulted   Assessment & Plan    Symptomatic sinus pause Planned for PPM today with Dr. [name] Pt had no follow up questions this AM Tentatively looks late afternoon schedule  2. Extensive LLE DVT Provoked with recent hip surgery  s/p mechanical thrombectomy of inferior vena cava, left common iliac, external iliac, common femoral, femoral, and popliteal veins and  Balloon venoplasty of the inferior vena cava, left common iliac, external iliac, common femoral, femoral, popliteal vein  Eliquis held today for PPM implant SCD's  3. Dementia Spoke with daughter [name] [phone]) this morning, answered her follow up questions, also remains agreeable to proceed with pacer as planned   For questions or updates, please contact [place] Please consult [email or web address] for contact info under        Signed, [name], PA-C  05/10/2021, 8:12 AM

## 2021-05-10 NOTE — Progress Notes (Signed)
PROGRESS NOTE    Wendy Watts  VQM:[id number] DOB: 07-10-1946 DOA: 04/26/2021 PCP: [name], MD    Brief Narrative:  75 y.o. female with history of advanced dementia who was recently admitted for right hip fracture discharged to rehab on March 30, 2021-brought to the ED on 9/12 for significant LLE swelling along with nausea/vomiting-patient was found to have extensive LLE DVT and AKI.  She was admitted to the hospitalist service-AKI was treated with supportive care-vascular surgery was consulted-patient subsequently underwent thrombectomy on 9/20.  See below for further details.  Subjective:   Patient in bed, appears comfortable, denies any headache, no fever, no chest pain or pressure, no shortness of breath , no abdominal pain. No new focal weakness.    Objective:  Vitals: Blood pressure 106/74, pulse 77, temperature 97.6 F (36.4 C), temperature source Oral, resp. rate 20, height 5\' 5"  (1.651 m), weight 64.6 kg, SpO2 100 %.   Exam:  Awake Alert x 2, No new F.N deficits, Normal affect [place].AT,PERRAL Supple Neck,No JVD, No cervical lymphadenopathy appriciated.  Symmetrical Chest wall movement, Good air movement bilaterally, CTAB RRR,No Gallops, Rubs or new Murmurs, No Parasternal Heave +ve B.Sounds, Abd Soft, No tenderness, No organomegaly appriciated, No rebound - guarding or rigidity. No Cyanosis, Clubbing or edema, No new Rash or bruise    Pertinent labs/radiology:  Doppler on 9/13: DVT involving left CFV, SF junction, left femoral vein, left proximal profunda vein, left popliteal vein, left posterior tibial vein, left peroneal vein, left gastrocnemius vein.    Assessment & Plan:  > 10 sec Sinus Blocks x 2 - DC'd Aricept, asymptomatic, EP called, pacemaker to be placed 05/10/21.  AKI: Hemodynamically mediated-due to nausea/vomiting-renal failure has resolved-creatinine back to baseline.    SIRS: Noninfectious etiology-likely from swelling/inflammation from  extensive LLE DVT.  UA/CXR/blood cultures remain negative.  All antimicrobial therapy discontinued on 9/14.  Leukocytosis has normalized.  Nausea/vomiting: Resolved-could have had a transient viral syndrome.  CT abdomen negative for any acute abnormalities.  HIDA scan was negative for cholecystitis.  Tolerating diet but oral intake is erratic.  Hypokalemia: Repleted.  Extensive LLE DVT s/p thrombectomy on 9/20: Was on IV heparin-has been transitioned to Eliquis.  Suspect DVT was provoked by recent hip surgery.  Given significant clot burden-will require at least 6 months of anticoagulation, apply TEDs.  Constipation: Significant stool burden seen on CT-given Dulcolax suppository with significant results-continue MiraLAX/senna.  BM on 9/22.  Recent right hip fracture-s/p ORIF on 8/14: Per last orthopedic note on 8/15-nonweightbearing for right lower extremity (but okay for touchdown weightbearing). Follow with Ortho in 1 week post DC.  Addendum: Per Ortho MD-Dr. Laverda Sorenson to be weightbearing as tolerated.  Dementia: Pleasantly confused-continue Aricept/Namenda/BuSpar.  Oral thrush: Improved-on nystatin.  Vitamin B12 deficiency: Started supplementation on 9/18.  Failure to thrive syndrome: Per patient's daughter-ever since she had recent hip fracture-her appetite has been poor-and has been failing to thrive syndrome.  Per nursing staff-continues to have poor oral intake-have started Remeron.  Debility/deconditioning: PT/OT evaluation in progress-recommendations are for [employer] has denied Media planner following with family for SNF placement..  Nutrition Status: Nutrition Problem: Moderate Malnutrition Etiology: chronic illness (dementia) Signs/Symptoms: mild fat depletion, moderate fat depletion, mild muscle depletion, moderate muscle depletion, energy intake < 75% for > or equal to 1 month Interventions: Boost Breeze, MVI, Prostat     DVT prophylaxis: Heparin  gtt Code Status: Full Family Communication: Daughter - Wendy Watts-[phone]-updated over the phone on 05/09/21  Status is: Inpatient  Remains inpatient appropriate because:Inpatient level of care appropriate due to severity of illness  Dispo: The patient is from: Home              Anticipated d/c is to: SNF              Patient currently is medically stable to d/c.  Awaiting SNF bed.   Difficult to place patient No    Consultants:  Vascular Surgery EP  Procedures:      1.  Ultrasound-guided access, left popliteal vein    2.  IVUS (intravascular ultrasound, left popliteal, femoral, common femoral, external iliac, and common iliac veins and inferior vena cava            3.  Mechanical venous thrombectomy using the INARI device of the inferior vena cava, left common iliac, external iliac, common femoral, femoral, and popliteal veins            4.  Balloon venoplasty of the inferior vena cava, left common iliac, external iliac, common femoral, femoral, popliteal vein            5.  Venography of the left popliteal, femoral, common femoral, external iliac, and common iliac veins as well as the inferior vena cava   TTE -   1. Left ventricular ejection fraction, by estimation, is 70 to 75%. The left ventricle has hyperdynamic function. The left ventricle has no regional wall motion abnormalities. Left ventricular diastolic parameters are consistent with Grade I diastolic dysfunction (impaired relaxation).  2. Right ventricular systolic function is normal. The right ventricular size is normal.  3. The mitral valve is normal in structure. No evidence of mitral valve regurgitation. No evidence of mitral stenosis.  4. The aortic valve is normal in structure. Aortic valve regurgitation is not visualized. No aortic stenosis is present.  5. The inferior vena cava is normal in size with greater than 50% respiratory variability, suggesting right atrial pressure of 3  mmHg.   Antimicrobials:  Anti-infectives (From admission, onward)    Start     Dose/Rate Route Frequency Ordered Stop   05/10/21 0000  gentamicin (GARAMYCIN) 80 mg in sodium chloride 0.9 % 500 mL irrigation        80 mg Irrigation On call 05/09/21 1135 05/11/21 0000   05/10/21 0000  vancomycin (VANCOCIN) IVPB 1000 mg/200 mL premix        1,000 mg 200 mL/hr over 60 Minutes Intravenous On call 05/09/21 1135 05/11/21 0000   04/27/21 0000  ceFEPIme (MAXIPIME) 2 g in sodium chloride 0.9 % 100 mL IVPB  Status:  Discontinued        2 g 200 mL/hr over 30 Minutes Intravenous Every 24 hours 04/26/21 2128 04/28/21 1626   04/26/21 2200  metroNIDAZOLE (FLAGYL) IVPB 500 mg  Status:  Discontinued        500 mg 100 mL/hr over 60 Minutes Intravenous Every 12 hours 04/26/21 2124 04/28/21 1626   04/26/21 2130  ceFEPIme (MAXIPIME) 2 g in sodium chloride 0.9 % 100 mL IVPB  Status:  Discontinued        2 g 200 mL/hr over 30 Minutes Intravenous  Once 04/26/21 2124 04/28/21 1626       Filed Weights   04/27/21 2146 05/01/21 0400 05/03/21 0422  Weight: 69.8 kg 65.1 kg 64.6 kg    Data Reviewed: I have personally reviewed following labs and imaging studies  CBC: Recent Labs  Lab 05/04/21 0320 05/05/21 0242 05/09/21 0149 05/10/21 0124  WBC 9.6  9.3 6.9 6.0  HGB 11.1* 10.4* 10.3* 10.7*  HCT 33.6* 31.8* 32.1* 33.2*  MCV 92.8 93.3 93.3 94.3  PLT 347 279 258 270   Basic Metabolic Panel: Recent Labs  Lab 05/04/21 0320 05/05/21 0242 05/06/21 0242 05/09/21 0149 05/10/21 0124  NA 134* 137 138 139 138  K 3.4* 3.5 3.5 3.5 3.6  CL 100 106 105 105 106  CO2 27 22 26 27 26   GLUCOSE 86 78 88 89 91  BUN 10 7* <5* <5* 9  CREATININE 0.71 0.83 0.72 0.77 0.82  CALCIUM 9.5 8.9 9.0 9.3 9.5  MG  --  1.5* 2.0  --  1.8   GFR: Estimated Creatinine Clearance: 53.3 mL/min (by C-G formula based on SCr of 0.82 mg/dL). Liver Function Tests: No results for input(s): AST, ALT, ALKPHOS, BILITOT, PROT, ALBUMIN in  the last 168 hours.  No results for input(s): LIPASE, AMYLASE in the last 168 hours.  No results for input(s): AMMONIA in the last 168 hours. Coagulation Profile: Recent Labs  Lab 05/10/21 0124  INR 1.5*   Cardiac Enzymes: No results for input(s): CKTOTAL, CKMB, CKMBINDEX, TROPONINI in the last 168 hours. BNP (last 3 results) No results for input(s): PROBNP in the last 8760 hours. HbA1C: No results for input(s): HGBA1C in the last 72 hours. CBG: No results for input(s): GLUCAP in the last 168 hours. Lipid Profile: No results for input(s): CHOL, HDL, LDLCALC, TRIG, CHOLHDL, LDLDIRECT in the last 72 hours. Thyroid Function Tests: No results for input(s): TSH, T4TOTAL, FREET4, T3FREE, THYROIDAB in the last 72 hours. Anemia Panel: No results for input(s): VITAMINB12, FOLATE, FERRITIN, TIBC, IRON, RETICCTPCT in the last 72 hours.  Sepsis Labs: No results for input(s): PROCALCITON, LATICACIDVEN in the last 168 hours.   Recent Results (from the past 240 hour(s))  Surgical PCR screen     Status: None   Collection Time: 05/09/21 12:20 PM   Specimen: Nasal Mucosa; Nasal Swab  Result Value Ref Range Status   MRSA, PCR NEGATIVE NEGATIVE Final   Staphylococcus aureus NEGATIVE NEGATIVE Final    Comment: (NOTE) The Xpert SA Assay (FDA approved for NASAL specimens in patients 31 years of age and older), is one component of a comprehensive surveillance program. It is not intended to diagnose infection nor to guide or monitor treatment. Performed at [facility], [place] [address], [place], [place] [address]        Radiology Studies: ECHOCARDIOGRAM COMPLETE  Result Date: 05/09/2021    ECHOCARDIOGRAM REPORT   Patient Name:   Wendy Watts Date of Exam: 05/09/2021 Medical Rec #:  [id number]     Height:       65.0 in Accession #:    [id number]    Weight:       142.4 lb Date of Birth:  August 09, 1946      BSA:          1.712 m Patient Age:    70 years      BP:           136/62 mmHg Patient  Gender: F             HR:           81 bpm. Exam Location:  Inpatient Procedure: 2D Echo Indications:    abnormal ecg  History:        Patient has no prior history of Echocardiogram examinations.  Signs/Symptoms:dementia.  Sonographer:    Johny Chess RDCS Referring Phys: [place] Comments: Image acquisition challenging due to uncooperative patient. IMPRESSIONS  1. Left ventricular ejection fraction, by estimation, is 70 to 75%. The left ventricle has hyperdynamic function. The left ventricle has no regional wall motion abnormalities. Left ventricular diastolic parameters are consistent with Grade I diastolic dysfunction (impaired relaxation).  2. Right ventricular systolic function is normal. The right ventricular size is normal.  3. The mitral valve is normal in structure. No evidence of mitral valve regurgitation. No evidence of mitral stenosis.  4. The aortic valve is normal in structure. Aortic valve regurgitation is not visualized. No aortic stenosis is present.  5. The inferior vena cava is normal in size with greater than 50% respiratory variability, suggesting right atrial pressure of 3 mmHg. FINDINGS  Left Ventricle: Left ventricular ejection fraction, by estimation, is 70 to 75%. The left ventricle has hyperdynamic function. The left ventricle has no regional wall motion abnormalities. The left ventricular internal cavity size was normal in size. There is no left ventricular hypertrophy. Left ventricular diastolic parameters are consistent with Grade I diastolic dysfunction (impaired relaxation). Right Ventricle: The right ventricular size is normal. No increase in right ventricular wall thickness. Right ventricular systolic function is normal. Left Atrium: Left atrial size was normal in size. Right Atrium: Right atrial size was normal in size. Pericardium: There is no evidence of pericardial effusion. Mitral Valve: The mitral valve is normal in structure. No  evidence of mitral valve regurgitation. No evidence of mitral valve stenosis. Tricuspid Valve: The tricuspid valve is normal in structure. Tricuspid valve regurgitation is not demonstrated. No evidence of tricuspid stenosis. Aortic Valve: The aortic valve is normal in structure. Aortic valve regurgitation is not visualized. No aortic stenosis is present. Pulmonic Valve: The pulmonic valve was normal in structure. Pulmonic valve regurgitation is not visualized. No evidence of pulmonic stenosis. Aorta: The aortic root is normal in size and structure. Venous: The inferior vena cava is normal in size with greater than 50% respiratory variability, suggesting right atrial pressure of 3 mmHg. IAS/Shunts: No atrial level shunt detected by color flow Doppler.  LEFT VENTRICLE PLAX 2D LVIDd:         3.50 cm  Diastology LVIDs:         2.10 cm  LV e' medial:    5.77 cm/s LV PW:         0.80 cm  LV E/e' medial:  8.6 LV IVS:        0.70 cm  LV e' lateral:   6.74 cm/s LVOT diam:     1.60 cm  LV E/e' lateral: 7.4 LV SV:         33 LV SV Index:   19 LVOT Area:     2.01 cm  RIGHT VENTRICLE RV S prime:     13.60 cm/s TAPSE (M-mode): 1.3 cm LEFT ATRIUM           Index LA diam:      2.80 cm 1.64 cm/m LA Vol (A4C): 21.1 ml 12.32 ml/m  AORTIC VALVE LVOT Vmax:   115.00 cm/s LVOT Vmean:  70.600 cm/s LVOT VTI:    0.166 m  AORTA Ao Root diam: 2.60 cm Ao Asc diam:  2.80 cm MITRAL VALVE MV Area (PHT): 2.91 cm    SHUNTS MV Decel Time: 261 msec    Systemic VTI:  0.17 m MV E velocity: 49.80 cm/s  Systemic Diam: 1.60 cm  MV A velocity: 79.30 cm/s MV E/A ratio:  0.63 Candee Furbish MD Electronically signed by Candee Furbish MD Signature Date/Time: 05/09/2021/11:09:59 AM    Final     Scheduled Meds:  (feeding supplement) PROSource Plus  30 mL Oral BID BM   busPIRone  10 mg Oral BID   cyanocobalamin  1,000 mcg Subcutaneous Q2000   feeding supplement  1 Container Oral TID BM   gentamicin irrigation  80 mg Irrigation On Call   memantine  10 mg Oral  BID   mirtazapine  7.5 mg Oral QHS   multivitamin with minerals  1 tablet Oral Daily   polyethylene glycol  17 g Oral BID   senna  2 tablet Oral QHS   sodium chloride flush  3 mL Intravenous Q12H   Continuous Infusions:  sodium chloride     sodium chloride 50 mL/hr at 05/10/21 0616   sodium chloride     vancomycin       LOS: 14 days   Signature  Lala Lund M.D on 05/10/2021 at 10:40 AM   -  To page go to [email or web address]

## 2021-05-10 NOTE — Progress Notes (Signed)
PT Cancellation Note  Patient Details Name: Wendy Watts MRN: [id number] DOB: 1946/07/05   Cancelled Treatment:    Reason Eval/Treat Not Completed: Medical issues which prohibited therapy - Pt with symptomatic cardiac pause over the weekend, plan for pacemaker placement today. Will hold.    Stacie Glaze, PT DPT Acute Rehabilitation Services Pager [phone]  Office [phone]    Louis Matte 05/10/2021, 8:57 AM

## 2021-05-10 NOTE — Telephone Encounter (Signed)
Pt daughter called and cancelled appt for wed due to pt having a pacemaker put in today. She is wondering when pt should follow up? And where to go from here?   CB [phone]

## 2021-05-10 NOTE — Telephone Encounter (Signed)
IC advised.  

## 2021-05-10 NOTE — Progress Notes (Signed)
ANTICOAGULATION CONSULT NOTE - Initial Consult  Pharmacy Consult for apixaban Indication: DVT  Allergies  Allergen Reactions   Allegra [Fexofenadine Hcl] Other (See Comments)    "takes me to another world"   Ativan [Lorazepam] Other (See Comments)    Hallucinations   Azithromycin Nausea And Vomiting   Codeine Hives   Doxycycline Hyclate Nausea Only   Penicillins Hives    Has patient had a PCN reaction causing immediate rash, facial/tongue/throat swelling, SOB or lightheadedness with hypotension: no Has patient had a PCN reaction causing severe rash involving mucus membranes or skin necrosis: no Has patient had a PCN reaction that required hospitalization: no Has patient had a PCN reaction occurring within the last 10 years: no If all of the above answers are "NO", then may proceed with Cephalosporin use.    Trazodone And Nefazodone Nausea Only   Zyrtec [Cetirizine] Other (See Comments)    "takes me to another world"    Patient Measurements: Height: 5\' 5"  (165.1 cm) Weight: 64.6 kg (142 lb 6.7 oz) IBW/kg (Calculated) : 57  Vital Signs: Temp: 98.5 F (36.9 C) (09/26 1117) Temp Source: Oral (09/26 1117) BP: 106/63 (09/26 1117) Pulse Rate: 77 (09/26 1117)  Labs: Recent Labs    05/09/21 0149 05/10/21 0124  HGB 10.3* 10.7*  HCT 32.1* 33.2*  PLT 258 284  LABPROT  --  18.5*  INR  --  1.5*  CREATININE 0.77 0.82    Estimated Creatinine Clearance: 53.3 mL/min (by C-G formula based on SCr of 0.82 mg/dL).   Medical History: Past Medical History:  Diagnosis Date   Anxiety    Dementia ([place])    High cholesterol    Vertigo     Medications:  Scheduled:   (feeding supplement) PROSource Plus  30 mL Oral BID BM   busPIRone  10 mg Oral BID   cyanocobalamin  1,000 mcg Subcutaneous Q2000   feeding supplement  1 Container Oral TID BM   memantine  10 mg Oral BID   mirtazapine  7.5 mg Oral QHS   multivitamin with minerals  1 tablet Oral Daily   polyethylene glycol  17 g  Oral BID   senna  2 tablet Oral QHS   sodium chloride flush  3 mL Intravenous Q12H    Assessment: 75yof found to have extensive LLE DVT , underwent thrombectomy 9/20. Now s/p PPM placement 9/26.  Hgb 10.7, plt 284. No s/sx of bleeding. Last dose of apixaban 5 mg on 9/25. Discussed with Dr Candiss Norse and EP, will plan to restart on 9/27 in AM - plan to evaluate site prior to dose being given.  Goal of Therapy:  Monitor platelets by anticoagulation protocol: Yes   Plan:  Start apixaban 5 mg BID on 9/27 at 1000 Monitor CBC, and for s/sx of bleeding   Antonietta Jewel, PharmD, [place] Clinical Pharmacist  Phone: [phone] 05/10/2021 4:44 PM  Please check AMION for all [place] phone numbers After 10:00 PM, call [place] [phone]

## 2021-05-10 NOTE — TOC Progression Note (Addendum)
Transition of Care [facility]) - Progression Note    Patient Details  Name: DAVANNA HE MRN: [id number] Date of Birth: 08-27-1945  Transition of Care [facility]) CM/SW [place], [place] Phone Number: 05/10/2021, 3:31 PM  Clinical Narrative:     Update- CSW spoke with Perrin Smack who confirmed she will look into see if patient has any SNF days left. Kitty gave copay amount per day to give patients daughter if not. Perrin Smack will follow back up with CSW in the morning on if she can offer SNF bed for patient. CSW informed patients daughter.  Update- CSW called [facility] to check on bed availability. [facility] confirmed they have no current bed availability. CSW awaiting callback from [place] with [facility]. Patients daughter confirmed if [facility] cannot offer then patients daughter is interested in patient going home with home health services.  CSW provided patients daughter with SNF bed offers. Patients daughter is going to look them over and give CSW call back with SNF choice. CSW also confirmed with patients daughter that CSW will check on [facility] and [facility] to see if they can make SNF bed offer for patient. Patients daughter wants CSW to check again on [place] to see if they have bed availability now. First choice would be [facility]. Second choice would be [facility]. CSW will continue to follow and assist with dc planning needs.  Expected Discharge Plan: Skilled Nursing Facility Barriers to Discharge: Continued Medical Work up  Expected Discharge Plan and Services Expected Discharge Plan: [place] In-house Referral: Clinical Social Work Discharge Planning Services: CM Consult Post Acute Care Choice: [place] (would like SNF, however out of SNF days, and cannot pay copay out of pocket) Living arrangements for the past 2 months: [place]: [place] Date [place]: 05/02/21 Time  Agency: 1222 Representative spoke with at [place]: Amy   Social Determinants of Health ([place]) Interventions    Readmission Risk Interventions No flowsheet data found.

## 2021-05-10 NOTE — Telephone Encounter (Signed)
Yes   ok to weight bear as tolerated

## 2021-05-10 NOTE — Progress Notes (Signed)
Occupational Therapy Treatment Patient Details Name: Wendy Watts MRN: [id number] DOB: April 29, 1946 Today's Date: 05/10/2021   History of present illness Pt is a 75 y.o. female admitted from home on 04/26/21 with nausea, vomiting, LLE swelling. CXR with mild L basilar atelectasis. Found to have extensive LLE DVT, AKI, SIRS. S/p LLE DVT mechanical thrombectomy 9/20. PMH includes advanced dementia, recent R hip fx s/p ORIF (03/28/21 with d/c to SNF for ~20 days per husband), HOH, vertigo, anxiety.   OT comments  Pt. Is scheduled for pacemaker sx later today following cardiac event yesterday. Pt. Nurse stated it was ok to work with pt. And get her up in chair. Pt. Was cooperative but confused during session. Pt. Daughter was there to encourage pt. To work with therapy. Pt. Was able to follow basic 1 step commands but is now following 50 wb and needs assist to achieve this. Acute OT to follow.    Recommendations for follow up therapy are one component of a multi-disciplinary discharge planning process, led by the attending physician.  Recommendations may be updated based on patient status, additional functional criteria and insurance authorization.    Follow Up Recommendations  SNF    Equipment Recommendations  None recommended by OT (TBD at dc location.)    Recommendations for Other Services      Precautions / Restrictions Precautions Precautions: Fall;Other (comment) Precaution Comments: pwb 50 lbs  R LE, cognitive deficits Required Braces or Orthoses:  (Pt. requires cues and assist to maintain pwb status.) Restrictions Weight Bearing Restrictions: Yes RLE Weight Bearing: Partial weight bearing RLE Partial Weight Bearing Percentage or Pounds: 50%       Mobility Bed Mobility Overal bed mobility: Needs Assistance Bed Mobility: Supine to Sit     Supine to sit: Mod assist     General bed mobility comments: cues for hand placement    Transfers Overall transfer level: Needs  assistance Equipment used: Rolling walker (2 wheeled) Transfers: Sit to/from [facility] Sit to Stand: Mod assist;+2 physical assistance Stand pivot transfers: Mod assist;+2 physical assistance       General transfer comment: pt. needs assist to maintain wb stauts.    Balance     Sitting balance-Leahy Scale: Fair       Standing balance-Leahy Scale: Poor                             ADL either performed or assessed with clinical judgement   ADL Overall ADL's : Needs assistance/impaired Eating/Feeding: Set up;Supervision/ safety (with ice chips. Pt. is having pacemaker sx today.)   Grooming: Set up;Supervision/safety;Sitting   Upper Body Bathing: Minimal assistance;Sitting   Lower Body Bathing: Maximal assistance;Sit to/from stand;Moderate assistance   Upper Body Dressing : Minimal assistance;Sitting   Lower Body Dressing: Sit to/from stand;Maximal assistance;Total assistance   Toilet Transfer: Minimal assistance;+2 for safety/equipment;Stand-pivot;RW Toilet Transfer Details (indicate cue type and reason): cues for wb Toileting- Clothing Manipulation and Hygiene: Total assistance;Sit to/from stand       Functional mobility during ADLs: Minimal assistance;+2 for safety/equipment;Rolling walker General ADL Comments: Pt. sat eob for  adls     Vision   Vision Assessment?: No apparent visual deficits   Perception     Praxis      Cognition Arousal/Alertness: Awake/alert Behavior During Therapy: WFL for tasks assessed/performed Overall Cognitive Status: History of cognitive impairments - at baseline  General Comments: hx of advanced dementia at baseline; perseverated on needing ice and needed lots of cues for task at hand and redirection. Not as impulsive today but very difficult to get her to adhere to 50% WB precautions R LE        Exercises     Shoulder Instructions       General  Comments      Pertinent Vitals/ Pain       Pain Assessment: 0-10 Pain Score: 6  Pain Intervention(s): Limited activity within patient's tolerance;Monitored during session (pain only with movement.)  Home Living                                          Prior Functioning/Environment              Frequency  Min 2X/week        Progress Toward Goals  OT Goals(current goals can now be found in the care plan section)  Progress towards OT goals: Progressing toward goals  Acute Rehab OT Goals Patient Stated Goal: family wants her to go to rehab and improve ability to care for herself. OT Goal Formulation: Patient unable to participate in goal setting Time For Goal Achievement: 05/15/21 Potential to Achieve Goals: Fair ADL Goals Pt Will Perform Grooming: with min guard assist;standing Pt Will Perform Upper Body Bathing: with set-up;sitting Pt Will Perform Lower Body Bathing: with min assist;sitting/lateral leans;sit to/from stand Pt Will Perform Upper Body Dressing: with set-up;sitting Pt Will Perform Lower Body Dressing: with min assist;with adaptive equipment;sitting/lateral leans;sit to/from stand Pt Will Transfer to Toilet: with min guard assist;ambulating;regular height toilet;grab bars Pt Will Perform Toileting - Clothing Manipulation and hygiene: with min assist;sitting/lateral leans;sit to/from stand Pt Will Perform Tub/Shower Transfer: Tub transfer;Shower transfer;with min guard assist;ambulating;shower seat;rolling walker Pt/caregiver will Perform Home Exercise Program: Increased strength;Both right and left upper extremity;With Supervision Additional ADL Goal #1: Pt will verbalize/demonstrate 3 fall prevention strategies to incorporate into her ADLs/ADL mobility to increase safety awareness with min guard-min assist overall. Additional ADL Goal #2: Pt will be Ox4 and follow 2 step commands with 80% accuracy in order to safely participate in ADLs/ADL  mobility with min-min guard overall. Additional ADL Goal #3: Pt will verbalize/demonstrate RLE PWB status with 80% accuracy in order to safely participate in ADLs/ADL mobility with min guard-min assist overall.  Plan Discharge plan remains appropriate    Co-evaluation                 AM-PAC OT "6 Clicks" Daily Activity     Outcome Measure   Help from another person eating meals?: A Little Help from another person taking care of personal grooming?: A Little Help from another person toileting, which includes using toliet, bedpan, or urinal?: Total Help from another person bathing (including washing, rinsing, drying)?: A Lot Help from another person to put on and taking off regular upper body clothing?: A Little Help from another person to put on and taking off regular lower body clothing?: Total 6 Click Score: 13    End of Session Equipment Utilized During Treatment: Gait belt;Rolling walker  OT Visit Diagnosis: Unsteadiness on feet (R26.81);Muscle weakness (generalized) (M62.81);Pain Pain - Right/Left: Left   Activity Tolerance Patient tolerated treatment well   Patient Left in bed;with call bell/phone within reach;with bed alarm set   Nurse Communication Mobility status  Time: 7414-2395 OT Time Calculation (min): 42 min  Charges: OT General Charges $OT Visit: 1 Visit OT Treatments $Self Care/Home Management : 23-37 mins $Therapeutic Activity: 8-22 mins  {Bryston Colocho OT/L   Brande Uncapher 05/10/2021, 9:59 AM

## 2021-05-11 ENCOUNTER — Inpatient Hospital Stay (HOSPITAL_COMMUNITY): Payer: PPO

## 2021-05-11 ENCOUNTER — Encounter (HOSPITAL_COMMUNITY): Payer: Self-pay | Admitting: Cardiology

## 2021-05-11 DIAGNOSIS — I82220 Acute embolism and thrombosis of inferior vena cava: Secondary | ICD-10-CM | POA: Diagnosis not present

## 2021-05-11 DIAGNOSIS — Z950 Presence of cardiac pacemaker: Secondary | ICD-10-CM

## 2021-05-11 DIAGNOSIS — Z20822 Contact with and (suspected) exposure to covid-19: Secondary | ICD-10-CM | POA: Diagnosis not present

## 2021-05-11 DIAGNOSIS — I82412 Acute embolism and thrombosis of left femoral vein: Secondary | ICD-10-CM | POA: Diagnosis not present

## 2021-05-11 DIAGNOSIS — B370 Candidal stomatitis: Secondary | ICD-10-CM | POA: Diagnosis not present

## 2021-05-11 DIAGNOSIS — I495 Sick sinus syndrome: Secondary | ICD-10-CM | POA: Diagnosis not present

## 2021-05-11 MED ORDER — APIXABAN 5 MG PO TABS
5.0000 mg | ORAL_TABLET | Freq: Two times a day (BID) | ORAL | Status: DC
  Administered 2021-05-11 – 2021-05-12 (×3): 5 mg via ORAL
  Filled 2021-05-11 (×2): qty 1

## 2021-05-11 MED FILL — Lidocaine HCl Local Inj 1%: INTRAMUSCULAR | Qty: 50 | Status: AC

## 2021-05-11 NOTE — TOC Progression Note (Signed)
Transition of Care [facility]) - Progression Note    Patient Details  Name: Wendy Watts MRN: [id number] Date of Birth: [birth year]  Transition of Care [facility]) CM/SW Contact  Graves-Bigelow, Ocie Cornfield, RN Phone Number: 05/11/2021, 12:35 PM  Clinical Narrative:  Case Manager spoke with daughter this morning regarding home health vs SNF. Daughter spoke with the patient's spouse and the plan will be for the patient to return home with home health services. Wendy Watts has been following the patient for physical therapy Science writer added Registered Nurse, Occupational Therapy, and Clinical Education officer, museum for additional community resources. Referral was made to [facility] and start of care to begin within 24-48 hours post transition home. Daughter states that the patient will travel home via private vehicle. No further needs from Case Manager at this time.   Expected Discharge Plan: [place] Barriers to Discharge: No Barriers Identified  Expected Discharge Plan and Services Expected Discharge Plan: [place] In-house Referral: Clinical Social Work Discharge Planning Services: CM Consult Post Acute Care Choice: [place] arrangements for the past 2 months: Single Family Home Expected Discharge Date: 05/11/21               DME Arranged: N/A DME Agency: NA       HH Arranged: RN, Disease Management, PT, OT, Social Work [employer] Agency: [place] Date HH Agency Contacted: 05/11/21 Time [place]: 1200 Representative spoke with at [place]: Amy    Readmission Risk Interventions No flowsheet data found.

## 2021-05-11 NOTE — Progress Notes (Signed)
Physical Therapy Treatment Patient Details Name: Wendy Watts MRN: [id number] DOB: 12/16/1945 Today's Date: 05/11/2021   History of Present Illness Pt is a 75 y.o. female admitted from home on 04/26/21 with nausea, vomiting, LLE swelling. CXR with mild L basilar atelectasis. Found to have extensive LLE DVT, AKI, SIRS. S/p LLE DVT mechanical thrombectomy 9/20 PPM placed 05/10/21. PMH includes advanced dementia, recent R hip fx s/p ORIF (03/28/21 with d/c to SNF for ~20 days per husband), HOH, vertigo, anxiety.    PT Comments    Pt supine in bed on entry, initially agreeable to get up to recliner. Pt unable to state why her L shoulder hurts or why she should not bear weight through her L UE. Pt requires maxA to keep from pushing through L UE with mobility. Pt limited in safe mobility by increased R hip pain with weightbearing as tolerated. Pt requires maximal multimodal cuing with all mobility, and max physical assist to come to EOB, modA for sit to stand, maxA for pivot transfers and total A for return to room. Left PPM precaution handout in room. Will attempt to return when daughter is room to review. Family request return home with [facility] services. Pt will need 24/7 supervision and mobility via wheelchair as she requires increased assistance due to pain. PT will continue to follow acutely.     Recommendations for follow up therapy are one component of a multi-disciplinary discharge planning process, led by the attending physician.  Recommendations may be updated based on patient status, additional functional criteria and insurance authorization.  Follow Up Recommendations  CIR;Supervision/Assistance - 24 hour     Equipment Recommendations  None recommended by PT       Precautions / Restrictions Precautions Precautions: Fall;Other (comment);ICD/Pacemaker (cognitive deficits) Restrictions Weight Bearing Restrictions: Yes LUE Weight Bearing: Non weight bearing (PMM placement) RLE Weight Bearing:  Weight bearing as tolerated (per Dr Marlou Sa note 05/10/21)     Mobility  Bed Mobility Overal bed mobility: Needs Assistance Bed Mobility: Supine to Sit     Supine to sit: HOB elevated;Max assist     General bed mobility comments: repeated multimodal cues for moving LE across bed, stops when moving painful R hip, constant cuing to keep L UE down and to not push through it, with max A able to manage LE off bed, keep pt from pushing through L UE and bring trunk to upright    Transfers Overall transfer level: Needs assistance Equipment used: Rolling walker (2 wheeled) Transfers: Sit to/from [facility] Sit to Stand: Mod assist Stand pivot transfers: Max assist       General transfer comment: pt reports need to have BM and is agreeable to get to [facility] able to stand with modA however with maximal multimodal cuing pt requires maxA for pivoting due to R hip pain, once on [facility] discuss further pivot to recliner. able to stand for pericare with modA but with max A to attempt to pivot to recliner pt R hip painful with weightbearing and pt pushed to pivot back to bed instead of to recliner.  Ambulation/Gait             General Gait Details: pt refuses further mobility          Balance Overall balance assessment: Needs assistance Sitting-balance support: Feet supported Sitting balance-Leahy Scale: Fair     Standing balance support: Bilateral upper extremity supported;During functional activity Standing balance-Leahy Scale: Poor Standing balance comment: reliant on UE support and external physical assistance  Cognition Arousal/Alertness: Awake/alert Behavior During Therapy: Restless;Anxious;Impulsive Overall Cognitive Status: History of cognitive impairments - at baseline                                 General Comments: pt reports need for BM, and starts transfer to [facility] however in mid transfer forgets transfer and  starts to panic, and pushes to get back to bed         General Comments General comments (skin integrity, edema, etc.): at rest BP 116/73, HR 92 bpm and SaO2 on RA 96%O2, with painful standing HR increased to 117 bpm, returns to 90s bpm with return to bed      Pertinent Vitals/Pain Pain Assessment: Faces Faces Pain Scale: Hurts even more Pain Location: R leg Pain Descriptors / Indicators: Discomfort;Guarding;Moaning Pain Intervention(s): Monitored during session;Limited activity within patient's tolerance;Repositioned     PT Goals (current goals can now be found in the care plan section) Acute Rehab PT Goals Patient Stated Goal: to have ice PT Goal Formulation: With patient Time For Goal Achievement: 05/15/21 Potential to Achieve Goals: Fair Progress towards PT goals: Not progressing toward goals - comment (limited by pain and confustion today)    Frequency    Min 3X/week      PT Plan Current plan remains appropriate       AM-PAC PT "6 Clicks" Mobility   Outcome Measure  Help needed turning from your back to your side while in a flat bed without using bedrails?: A Little Help needed moving from lying on your back to sitting on the side of a flat bed without using bedrails?: A Lot Help needed moving to and from a bed to a chair (including a wheelchair)?: A Lot Help needed standing up from a chair using your arms (e.g., wheelchair or bedside chair)?: A Lot Help needed to walk in hospital room?: Total Help needed climbing 3-5 steps with a railing? : Total 6 Click Score: 11    End of Session Equipment Utilized During Treatment: Gait belt Activity Tolerance: Patient tolerated treatment well;Other (comment);Patient limited by pain (limited by cognition) Patient left: in bed;with call bell/phone within reach;with bed alarm set Nurse Communication: Mobility status PT Visit Diagnosis: Other abnormalities of gait and mobility (R26.89);Muscle weakness (generalized)  (M62.81);Pain;Unsteadiness on feet (R26.81);Difficulty in walking, not elsewhere classified (R26.2) Pain - Right/Left: Right Pain - part of body: Leg     Time: 0086-7619 PT Time Calculation (min) (ACUTE ONLY): 26 min  Charges:  $Therapeutic Activity: 23-37 mins                     Zoi Devine B. Migdalia Dk PT, DPT Acute Rehabilitation Services Pager [phone] Office [phone]    [place] 05/11/2021, 1:22 PM

## 2021-05-11 NOTE — TOC Progression Note (Addendum)
Transition of Care [facility]) - Progression Note    Patient Details  Name: Wendy Watts MRN: [id number] Date of Birth: Sep 15, 1945  Transition of Care [facility]) CM/SW [place], [place] Phone Number: 05/11/2021, 8:44 AM  Clinical Narrative:     Update 11:32am- CSW received call from patients daughter. Daughter confirmed she wants patient to go home with hh. CSW informed MD, case Freight forwarder, and RN.  CSW spoke with patients daughter and let her know that [facility] can make bed offer but confirmed that [place] with [facility] informed CSW that patient has used 22 days at previous SNF. Patient has 78 copay days left at $184.00 per day. CSW informed patients daughter. Patients daughter confirmed with CSW she will give call back with decision SNF. Vrs. HH. CSW informed case manager and MD.  CSW left voicemail for patients daughter to discuss dc plan. CSW awaiting callback.  Expected Discharge Plan: Skilled Nursing Facility Barriers to Discharge: Continued Medical Work up  Expected Discharge Plan and Services Expected Discharge Plan: [place] In-house Referral: Clinical Social Work Discharge Planning Services: CM Consult Post Acute Care Choice: [place] (would like SNF, however out of SNF days, and cannot pay copay out of pocket) Living arrangements for the past 2 months: Single Family Home Expected Discharge Date: 05/11/21                           [facility] Agency: [place] Date [place]: 05/02/21 Time [place]: 1222 Representative spoke with at [place]: Amy   Social Determinants of Health ([place]) Interventions    Readmission Risk Interventions No flowsheet data found.

## 2021-05-11 NOTE — Care Management Important Message (Signed)
Important Message  Patient Details  Name: Wendy Watts MRN: [id number] Date of Birth: 1945/11/14   Medicare Important Message Given:  Yes     Shelda Altes 05/11/2021, 11:54 AM

## 2021-05-11 NOTE — Progress Notes (Signed)
Progress Note  Patient Name: Wendy Watts Date of Encounter: 05/11/2021  [facility] Cardiologist: New to [facility]   Subjective   No new c/o, denies pain at site  Inpatient Medications    Scheduled Meds:  (feeding supplement) PROSource Plus  30 mL Oral BID BM   apixaban  5 mg Oral BID   busPIRone  10 mg Oral BID   cyanocobalamin  1,000 mcg Subcutaneous Q2000   feeding supplement  1 Container Oral TID BM   memantine  10 mg Oral BID   mirtazapine  7.5 mg Oral QHS   multivitamin with minerals  1 tablet Oral Daily   polyethylene glycol  17 g Oral BID   senna  2 tablet Oral QHS   sodium chloride flush  3 mL Intravenous Q12H   Continuous Infusions:  sodium chloride     PRN Meds: sodium chloride, acetaminophen **OR** acetaminophen, bisacodyl, hydrALAZINE, ondansetron (ZOFRAN) IV, sodium chloride flush   Vital Signs    Vitals:   05/10/21 1706 05/10/21 2100 05/11/21 0431 05/11/21 0828  BP: 110/90 127/69 119/66 108/70  Pulse: 79 74 77 80  Resp:  19 19 16   Temp: 98.1 F (36.7 C) 98.7 F (37.1 C) 97.7 F (36.5 C) 99 F (37.2 C)  TempSrc: Oral Oral Oral Oral  SpO2: 98% 92% 90% 91%  Weight:      Height:        Intake/Output Summary (Last 24 hours) at 05/11/2021 0928 Last data filed at 05/10/2021 2100 Gross per 24 hour  Intake 699.53 ml  Output 200 ml  Net 499.53 ml   Last 3 Weights 05/03/2021 05/01/2021 04/27/2021  Weight (lbs) 142 lb 6.7 oz 143 lb 8.3 oz 153 lb 14.1 oz  Weight (kg) 64.6 kg 65.1 kg 69.8 kg      Telemetry    SR 60's-70's - Personally Reviewed  ECG    SR 76bpm - Personally Reviewed  Physical Exam   GEN: No acute distress.   Neck: No JVD Cardiac: RRR, no murmurs, rubs, or gallops.  Respiratory: CTA b/l. GI: Soft, nontender, non-distended  MS: No edema; age appropriate atrophy. Neuro:  Nonfocal, known baseline dementia  Psych: Normal affect   PPM is stable, no hematoma, no bleeding  Labs    High Sensitivity Troponin:  No results for  input(s): TROPONINIHS in the last 720 hours.   Chemistry Recent Labs  Lab 05/05/21 0242 05/06/21 0242 05/09/21 0149 05/10/21 0124  NA 137 138 139 138  K 3.5 3.5 3.5 3.6  CL 106 105 105 106  CO2 22 26 27 26   GLUCOSE 78 88 89 91  BUN 7* <5* <5* 9  CREATININE 0.83 0.72 0.77 0.82  CALCIUM 8.9 9.0 9.3 9.5  MG 1.5* 2.0  --  1.8  GFRNONAA >60 >60 >60 >60  ANIONGAP 9 7 7 6     Lipids No results for input(s): CHOL, TRIG, HDL, LABVLDL, LDLCALC, CHOLHDL in the last 168 hours.  Hematology Recent Labs  Lab 05/05/21 0242 05/09/21 0149 05/10/21 0124  WBC 9.3 6.9 6.0  RBC 3.41* 3.44* 3.52*  HGB 10.4* 10.3* 10.7*  HCT 31.8* 32.1* 33.2*  MCV 93.3 93.3 94.3  MCH 30.5 29.9 30.4  MCHC 32.7 32.1 32.2  RDW 13.9 14.5 14.4  PLT 279 258 284   Thyroid No results for input(s): TSH, FREET4 in the last 168 hours.  BNPNo results for input(s): BNP, PROBNP in the last 168 hours.  DDimer No results for input(s): DDIMER in the  last 168 hours.   Radiology     Cardiac Studies   05/09/21: TTE IMPRESSIONS   1. Left ventricular ejection fraction, by estimation, is 70 to 75%. The  left ventricle has hyperdynamic function. The left ventricle has no  regional wall motion abnormalities. Left ventricular diastolic parameters  are consistent with Grade I diastolic  dysfunction (impaired relaxation).   2. Right ventricular systolic function is normal. The right ventricular  size is normal.   3. The mitral valve is normal in structure. No evidence of mitral valve  regurgitation. No evidence of mitral stenosis.   4. The aortic valve is normal in structure. Aortic valve regurgitation is  not visualized. No aortic stenosis is present.   5. The inferior vena cava is normal in size with greater than 50%  respiratory variability, suggesting right atrial pressure of 3 mmHg.   Patient Profile     75 y.o. female w/PMHx of advanced dementia, HLD.  She was recently admitted with a fall and R hip fx, discharged  to rehab 04/30/21.  Developed LLE swelling, N/V, found with extensive LLE DVT and AI >> vascular surgery s/p intervention (as below) > heparin > Eliquis  Developed prolonged pause/asystolic event, symptomatic and EP consulted   Assessment & Plan    Symptomatic sinus pause  S.p PPM yesterday Site is stable, OK for Eliquis  Device check this morning with stable measurements CXR this morning with stable lead position, no ptx I will review wound care and activity restrictions with the patient once her daughter is at bedside. I tried to call [name] this AM, went to VM  Dr. [name] has seen and examined the patient this AM I have asked the RN to call me once daughter is at bedside Once teaching with family is completed she is OK to discharge from our perspective    2. Extensive LLE DVT Provoked with recent hip surgery  s/p mechanical thrombectomy of inferior vena cava, left common iliac, external iliac, common femoral, femoral, and popliteal veins and  Balloon venoplasty of the inferior vena cava, left common iliac, external iliac, common femoral, femoral, popliteal vein   3. Dementia    For questions or updates, please contact [facility] Please consult [email or web address] for contact info under        Signed, [name], PA-C  05/11/2021, 9:28 AM

## 2021-05-11 NOTE — Progress Notes (Signed)
PROGRESS NOTE    Wendy Watts  TMH:[id number] DOB: [birth year] DOA: 04/26/2021 PCP: [name], MD    Brief Narrative:  75 y.o. female with history of advanced dementia who was recently admitted for right hip fracture discharged to rehab on March 30, 2021-brought to the ED on 9/12 for significant LLE swelling along with nausea/vomiting-patient was found to have extensive LLE DVT and AKI.  She was admitted to the hospitalist service-AKI was treated with supportive care-vascular surgery was consulted-patient subsequently underwent thrombectomy on 9/20.  See below for further details.  Subjective:   Patient in bed, appears comfortable, denies any headache, no fever, no chest pain or pressure, no shortness of breath , no abdominal pain. No new focal weakness.    Objective:  Vitals: Blood pressure 104/72, pulse 77, temperature 99 F (37.2 C), temperature source Oral, resp. rate (!) 21, height 5\' 5"  (1.651 m), weight 64.6 kg, SpO2 99 %.   Exam:  Awake Alertx 1, No new F.N deficits, Normal affect [place].AT,PERRAL Supple Neck,No JVD, No cervical lymphadenopathy appriciated.  Symmetrical Chest wall movement, Good air movement bilaterally, CTAB RRR,No Gallops, Rubs or new Murmurs, No Parasternal Heave +ve B.Sounds, Abd Soft, No tenderness, No organomegaly appriciated, No rebound - guarding or rigidity. No Cyanosis, L. Arm in sling, PPM site appears clean     Pertinent labs/radiology:  Doppler on 9/13: DVT involving left CFV, SF junction, left femoral vein, left proximal profunda vein, left popliteal vein, left posterior tibial vein, left peroneal vein, left gastrocnemius vein.    Assessment & Plan:  > 10 sec Sinus Blocks x 2 - DC'd Aricept, asymptomatic, EP called, pacemaker  placed 05/10/21.  AKI: Hemodynamically mediated-due to nausea/vomiting-renal failure has resolved-creatinine back to baseline.    SIRS: Noninfectious etiology-likely from swelling/inflammation from  extensive LLE DVT.  UA/CXR/blood cultures remain negative.  All antimicrobial therapy discontinued on 9/14.  Leukocytosis has normalized.  Nausea/vomiting: Resolved-could have had a transient viral syndrome.  CT abdomen negative for any acute abnormalities.  HIDA scan was negative for cholecystitis.  Tolerating diet but oral intake is erratic.  Hypokalemia: Repleted.  Extensive LLE DVT s/p thrombectomy on 9/20: Was on IV heparin-has been transitioned to Eliquis.  Suspect DVT was provoked by recent hip surgery.  Given significant clot burden-will require at least 6 months of anticoagulation, apply TEDs.  Constipation: Significant stool burden seen on CT-given Dulcolax suppository with significant results-continue MiraLAX/senna.  BM on 9/22.  Recent right hip fracture-s/p ORIF on 8/14: Per last orthopedic note on 8/15-nonweightbearing for right lower extremity (but okay for touchdown weightbearing). Follow with Ortho in 1 week post DC.  Addendum: Per Ortho MD-Dr. Laverda Sorenson to be weightbearing as tolerated.  Dementia: Pleasantly confused-continue Aricept/Namenda/BuSpar.  Oral thrush: Improved-on nystatin.  Vitamin B12 deficiency: Started supplementation on 9/18.  Failure to thrive syndrome: Per patient's daughter-ever since she had recent hip fracture-her appetite has been poor-and has been failing to thrive syndrome.  Per nursing staff-continues to have poor oral intake-have started Remeron.  Debility/deconditioning: PT/OT evaluation in progress-recommendations are for [employer] has denied Media planner following with family for SNF placement..  Nutrition Status: Nutrition Problem: Moderate Malnutrition Etiology: chronic illness (dementia) Signs/Symptoms: mild fat depletion, moderate fat depletion, mild muscle depletion, moderate muscle depletion, energy intake < 75% for > or equal to 1 month Interventions: Boost Breeze, MVI, Prostat     DVT prophylaxis: Heparin  gtt Code Status: Full Family Communication: Daughter - [phone]-updated over the phone on 05/09/21, 05/11/21  Status is:  Inpatient  Remains inpatient appropriate because:Inpatient level of care appropriate due to severity of illness  Dispo: The patient is from: Home              Anticipated d/c is to: SNF              Patient currently is medically stable to d/c.  Awaiting SNF bed.   Difficult to place patient No   Consultants:  Vascular Surgery EP  Procedures:      1.  Ultrasound-guided access, left popliteal vein    2.  IVUS (intravascular ultrasound, left popliteal, femoral, common femoral, external iliac, and common iliac veins and inferior vena cava            3.  Mechanical venous thrombectomy using the INARI device of the inferior vena cava, left common iliac, external iliac, common femoral, femoral, and popliteal veins            4.  Balloon venoplasty of the inferior vena cava, left common iliac, external iliac, common femoral, femoral, popliteal vein            5.  Venography of the left popliteal, femoral, common femoral, external iliac, and common iliac veins as well as the inferior vena cava   TTE -   1. Left ventricular ejection fraction, by estimation, is 70 to 75%. The left ventricle has hyperdynamic function. The left ventricle has no regional wall motion abnormalities. Left ventricular diastolic parameters are consistent with Grade I diastolic dysfunction (impaired relaxation).  2. Right ventricular systolic function is normal. The right ventricular size is normal.  3. The mitral valve is normal in structure. No evidence of mitral valve regurgitation. No evidence of mitral stenosis.  4. The aortic valve is normal in structure. Aortic valve regurgitation is not visualized. No aortic stenosis is present.  5. The inferior vena cava is normal in size with greater than 50% respiratory variability, suggesting right atrial pressure of 3 mmHg.   PPM placement  05/10/21   Antimicrobials:  Anti-infectives (From admission, onward)    Start     Dose/Rate Route Frequency Ordered Stop   05/10/21 0000  gentamicin (GARAMYCIN) 80 mg in sodium chloride 0.9 % 500 mL irrigation        80 mg Irrigation On call 05/09/21 1135 05/10/21 1510   05/10/21 0000  vancomycin (VANCOCIN) IVPB 1000 mg/200 mL premix        1,000 mg 200 mL/hr over 60 Minutes Intravenous On call 05/09/21 1135 05/10/21 1532   04/27/21 0000  ceFEPIme (MAXIPIME) 2 g in sodium chloride 0.9 % 100 mL IVPB  Status:  Discontinued        2 g 200 mL/hr over 30 Minutes Intravenous Every 24 hours 04/26/21 2128 04/28/21 1626   04/26/21 2200  metroNIDAZOLE (FLAGYL) IVPB 500 mg  Status:  Discontinued        500 mg 100 mL/hr over 60 Minutes Intravenous Every 12 hours 04/26/21 2124 04/28/21 1626   04/26/21 2130  ceFEPIme (MAXIPIME) 2 g in sodium chloride 0.9 % 100 mL IVPB  Status:  Discontinued        2 g 200 mL/hr over 30 Minutes Intravenous  Once 04/26/21 2124 04/28/21 1626       Filed Weights   04/27/21 2146 05/01/21 0400 05/03/21 0422  Weight: 69.8 kg 65.1 kg 64.6 kg    Data Reviewed: I have personally reviewed following labs and imaging studies  CBC: Recent Labs  Lab 05/05/21 0242 05/09/21 0149 05/10/21  0124  WBC 9.3 6.9 6.0  HGB 10.4* 10.3* 10.7*  HCT 31.8* 32.1* 33.2*  MCV 93.3 93.3 94.3  PLT 279 258 469   Basic Metabolic Panel: Recent Labs  Lab 05/05/21 0242 05/06/21 0242 05/09/21 0149 05/10/21 0124  NA 137 138 139 138  K 3.5 3.5 3.5 3.6  CL 106 105 105 106  CO2 22 26 27 26   GLUCOSE 78 88 89 91  BUN 7* <5* <5* 9  CREATININE 0.83 0.72 0.77 0.82  CALCIUM 8.9 9.0 9.3 9.5  MG 1.5* 2.0  --  1.8   GFR: Estimated Creatinine Clearance: 53.3 mL/min (by C-G formula based on SCr of 0.82 mg/dL). Liver Function Tests: No results for input(s): AST, ALT, ALKPHOS, BILITOT, PROT, ALBUMIN in the last 168 hours.  No results for input(s): LIPASE, AMYLASE in the last 168  hours.  No results for input(s): AMMONIA in the last 168 hours. Coagulation Profile: Recent Labs  Lab 05/10/21 0124  INR 1.5*   Cardiac Enzymes: No results for input(s): CKTOTAL, CKMB, CKMBINDEX, TROPONINI in the last 168 hours. BNP (last 3 results) No results for input(s): PROBNP in the last 8760 hours. HbA1C: No results for input(s): HGBA1C in the last 72 hours. CBG: No results for input(s): GLUCAP in the last 168 hours. Lipid Profile: No results for input(s): CHOL, HDL, LDLCALC, TRIG, CHOLHDL, LDLDIRECT in the last 72 hours. Thyroid Function Tests: No results for input(s): TSH, T4TOTAL, FREET4, T3FREE, THYROIDAB in the last 72 hours. Anemia Panel: No results for input(s): VITAMINB12, FOLATE, FERRITIN, TIBC, IRON, RETICCTPCT in the last 72 hours.  Sepsis Labs: No results for input(s): PROCALCITON, LATICACIDVEN in the last 168 hours.   Recent Results (from the past 240 hour(s))  Surgical PCR screen     Status: None   Collection Time: 05/09/21 12:20 PM   Specimen: Nasal Mucosa; Nasal Swab  Result Value Ref Range Status   MRSA, PCR NEGATIVE NEGATIVE Final   Staphylococcus aureus NEGATIVE NEGATIVE Final    Comment: (NOTE) The Xpert SA Assay (FDA approved for NASAL specimens in patients 36 years of age and older), is one component of a comprehensive surveillance program. It is not intended to diagnose infection nor to guide or monitor treatment. Performed at [facility], [place] [address] [place], [place] [address]        Radiology Studies: DG Chest 2 View  Result Date: 05/11/2021 CLINICAL DATA:  Pacemaker placement EXAM: CHEST - 2 VIEW COMPARISON:  04/26/2021 FINDINGS: Left pacer is in place with single lead tip in the right ventricle. No pneumothorax. Heart and mediastinal contours are within normal limits. No focal opacities or effusions. No acute bony abnormality. IMPRESSION: Left pacer placement.  No pneumothorax. Electronically Signed   By: Rolm Baptise M.D.    On: 05/11/2021 09:17    Scheduled Meds:  (feeding supplement) PROSource Plus  30 mL Oral BID BM   apixaban  5 mg Oral BID   busPIRone  10 mg Oral BID   cyanocobalamin  1,000 mcg Subcutaneous Q2000   feeding supplement  1 Container Oral TID BM   memantine  10 mg Oral BID   mirtazapine  7.5 mg Oral QHS   multivitamin with minerals  1 tablet Oral Daily   polyethylene glycol  17 g Oral BID   senna  2 tablet Oral QHS   sodium chloride flush  3 mL Intravenous Q12H   Continuous Infusions:  sodium chloride       LOS: 15 days  Signature  Lala Lund M.D on 05/11/2021 at 11:00 AM   -  To page go to [email or web address]

## 2021-05-11 NOTE — Progress Notes (Addendum)
Teaching completed with patient and her daughter at bedside.  Tommye Standard, PA-C

## 2021-05-12 ENCOUNTER — Encounter: Payer: PPO | Admitting: Orthopedic Surgery

## 2021-05-12 LAB — GLUCOSE, CAPILLARY: Glucose-Capillary: 91 mg/dL (ref 70–99)

## 2021-05-12 MED ORDER — ACETAMINOPHEN 500 MG PO TABS: 500.0000 mg | ORAL_TABLET | Freq: Three times a day (TID) | ORAL | 0 refills | Status: DC | PRN

## 2021-05-12 MED ORDER — VITAMIN B-12 1000 MCG PO TABS: 1000.0000 ug | ORAL_TABLET | Freq: Every day | ORAL | 0 refills | Status: DC

## 2021-05-12 MED ORDER — APIXABAN 5 MG PO TABS: 5.0000 mg | ORAL_TABLET | Freq: Two times a day (BID) | ORAL | 0 refills | Status: DC

## 2021-05-12 MED ORDER — POLYETHYLENE GLYCOL 3350 17 G PO PACK: 17.0000 g | PACK | Freq: Every day | ORAL | 0 refills | Status: DC | PRN

## 2021-05-12 NOTE — Discharge Summary (Signed)
Wendy Watts XGZ:[id number] DOB: June 28, 1946 DOA: 04/26/2021  PCP: [name], MD  Admit date: 04/26/2021  Discharge date: 05/12/2021  Admitted From: Home  Disposition:  Home   Recommendations for Outpatient Follow-up:   Follow up with PCP in 1-2 weeks  PCP Please obtain BMP/CBC, 2 view CXR in 1week,  (see Discharge instructions)   PCP Please follow up on the following pending results:    Home Health: PT,RN,SW   Equipment/Devices: none  Consultations: VVS, Cards Discharge Condition: Stable    CODE STATUS: Full    Diet Recommendation: Heart Healthy   Diet Order             Diet Heart Room service appropriate? Yes; Fluid consistency: Thin  Diet effective now                    Chief Complaint  Patient presents with   Abdominal Pain   Leg Swelling    L leg     Brief history of present illness from the day of admission and additional interim summary    75 y.o. female with history of advanced dementia who was recently admitted for right hip fracture discharged to rehab on March 30, 2021-brought to the ED on 9/12 for significant LLE swelling along with nausea/vomiting-patient was found to have extensive LLE DVT and AKI.  She was admitted to the hospitalist service-AKI was treated with supportive care-vascular surgery was consulted-patient subsequently underwent thrombectomy on 9/20.  See below for further details.                                                                 Hospital Course    > 10 sec Sinus Blocks x 2 - DC'd Aricept, asymptomatic, EP called, pacemaker  placed 05/10/21, site appears stable, symptom free, DC home.   Extensive LLE DVT s/p thrombectomy on 9/20: Was on IV heparin-has been transitioned to Eliquis.  Suspect DVT was provoked by recent hip surgery.  Given significant  clot burden-will require at least 6 months of anticoagulation.   Recent right hip fracture-s/p ORIF on 8/14: Per last orthopedic note on 8/15-nonweightbearing for right lower extremity (but okay for touchdown weightbearing). Follow with Ortho in 1 week post DC.   Addendum: Per Ortho MD-Dr. Laverda Sorenson to be weightbearing as tolerated.   AKI: Hemodynamically mediated-due to nausea/vomiting-renal failure has resolved-creatinine back to baseline.     SIRS: Noninfectious etiology-likely from swelling/inflammation from extensive LLE DVT.  UA/CXR/blood cultures remain negative.  All antimicrobial therapy discontinued on 9/14.  Leukocytosis has normalized.  Dementia: Pleasantly confused-continue Aricept/Namenda/BuSpar.   Oral thrush: resolved after nystatin.   Vitamin B12 deficiency: Started supplementation on 9/18.   Debility/deconditioning: PT/OT - HHPT  Discharge diagnosis     Principal Problem:   Acute renal failure (ARF) (  [place]) Active Problems:   Dementia without behavioral disturbance (HCC)   SIRS (systemic inflammatory response syndrome) (HCC)   Nausea & vomiting   ARF (acute renal failure) (HCC)   Malnutrition of moderate degree   Cardiac pacemaker in situ    Discharge instructions    Discharge Instructions     Discharge instructions   Complete by: As directed    Follow with Primary MD [name], MD in 7 days   Get CBC, CMP, 2 view Chest X ray -  checked next visit within 1 week by Primary MD   Activity: As tolerated with Full fall precautions use walker/cane & assistance as needed, review L. Arm activity instructions as given by cardiology.  Disposition Home   Diet: Heart Healthy    Special Instructions: If you have smoked or chewed Tobacco  in the last 2 yrs please stop smoking, stop any regular Alcohol  and or any Recreational drug use.  On your next visit with your primary care physician please Get Medicines reviewed and adjusted.  Please request  your Prim.MD to go over all Hospital Tests and Procedure/Radiological results at the follow up, please get all Hospital records sent to your Prim MD by signing hospital release before you go home.  If you experience worsening of your admission symptoms, develop shortness of breath, life threatening emergency, suicidal or homicidal thoughts you must seek medical attention immediately by calling 911 or calling your MD immediately  if symptoms less severe.  You Must read complete instructions/literature along with all the possible adverse reactions/side effects for all the Medicines you take and that have been prescribed to you. Take any new Medicines after you have completely understood and accpet all the possible adverse reactions/side effects.       Discharge Medications   Allergies as of 05/12/2021       Reactions   Allegra [fexofenadine Hcl] Other (See Comments)   "takes me to another world"   Ativan [lorazepam] Other (See Comments)   Hallucinations   Azithromycin Nausea And Vomiting   Codeine Hives   Doxycycline Hyclate Nausea Only   Penicillins Hives   Has patient had a PCN reaction causing immediate rash, facial/tongue/throat swelling, SOB or lightheadedness with hypotension: no Has patient had a PCN reaction causing severe rash involving mucus membranes or skin necrosis: no Has patient had a PCN reaction that required hospitalization: no Has patient had a PCN reaction occurring within the last 10 years: no If all of the above answers are "NO", then may proceed with Cephalosporin use.   Trazodone And Nefazodone Nausea Only   Zyrtec [cetirizine] Other (See Comments)   "takes me to another world"        Medication List     STOP taking these medications    enoxaparin 40 MG/0.4ML injection Commonly known as: LOVENOX   methocarbamol 500 MG tablet Commonly known as: ROBAXIN   traMADol 50 MG tablet Commonly known as: ULTRAM       TAKE these medications     acetaminophen 500 MG tablet Commonly known as: TYLENOL Take 1 tablet (500 mg total) by mouth every 8 (eight) hours as needed for moderate pain.   apixaban 5 MG Tabs tablet Commonly known as: ELIQUIS Take 1 tablet (5 mg total) by mouth 2 (two) times daily.   Benefiber Drink Mix Pack Take 1 Scoop by mouth daily. Mix in applesauce   busPIRone 10 MG tablet Commonly known as: BUSPAR Take 10 mg by mouth 2 (two) times  daily.   donepezil 10 MG tablet Commonly known as: ARICEPT TAKE 1 TABLET(10 MG) BY MOUTH AT BEDTIME What changed:  how much to take how to take this when to take this additional instructions   fluticasone 50 MCG/ACT nasal spray Commonly known as: FLONASE Place 2 sprays into both nostrils daily as needed for allergies or rhinitis.   memantine 10 MG tablet Commonly known as: Namenda Take 1 tablet (10 mg total) by mouth 2 (two) times daily.   polyethylene glycol 17 g packet Commonly known as: MIRALAX / GLYCOLAX Take 17 g by mouth daily as needed.   vitamin B-12 1000 MCG tablet Commonly known as: CYANOCOBALAMIN Take 1 tablet (1,000 mcg total) by mouth daily.   VITAMIN D PO Take 5,000 Units by mouth daily.         Follow-up Information     [facility] Follow up in 1 month(s).   Why: The office will call the patient with an appointment Contact information: [place] [phone]        [place] [facility] Follow up.   Specialty: Cardiology Why: 05/20/21 @ 9:20AM, wound check visit (pacemaker) Contact information: [address] [place]        Vickie Epley, MD Follow up.   Specialties: Cardiology, Radiology Why: 08/12/21 @ 2:00PM Contact information: [address] [place] [address] [place] Follow up.   Why: If you have questions please call [phone].  Physical Therapy,Occupational Therapy, Registered Nurse, Clinical Social Worker-office to call you with visit times.        [name], MD. Schedule an appointment as soon as possible for a visit in 1 week(s).   Specialty: Family Medicine Contact information: [place] [address] [phone]                 Major procedures and Radiology Reports - PLEASE review detailed and final reports thoroughly  -       CT ABDOMEN PELVIS WO CONTRAST  Result Date: 04/26/2021 CLINICAL DATA:  Abdominal pain, acute, nonlocalized EXAM: CT ABDOMEN AND PELVIS WITHOUT CONTRAST TECHNIQUE: Multidetector CT imaging of the abdomen and pelvis was performed following the standard protocol without IV contrast. Unenhanced CT was performed per clinician order. Lack of IV contrast limits sensitivity and specificity, especially for evaluation of abdominal/pelvic solid viscera. COMPARISON:  09/21/2016 FINDINGS: Lower chest: No acute pleural or parenchymal lung disease. Hepatobiliary: High attenuation material within the gallbladder consistent with sludge. No evidence of calcified gallstones or cholecystitis. Liver is unremarkable. Pancreas: Unremarkable. No pancreatic ductal dilatation or surrounding inflammatory changes. Spleen: Normal in size without focal abnormality. Adrenals/Urinary Tract: The adrenals are stable. There is a punctate 2 mm nonobstructing calculus within the lower pole left kidney. No right-sided calculi. No obstructive uropathy within either kidney. The bladder is minimally distended, limiting its evaluation. Punctate 2 mm calculus is seen layering dependently within the right lateral aspect of the bladder. Stomach/Bowel: There is a large amount of retained stool within the rectal vault, consistent with fecal impaction. No bowel obstruction or ileus. Normal appendix right lower quadrant. No bowel wall thickening or inflammatory change. Vascular/Lymphatic: Minimal  atherosclerosis. There is asymmetric enlargement of the left common iliac, internal iliac, and external iliac veins concerning for deep venous thrombosis. Correlation with left lower extremity Doppler evaluation may be useful.  Reproductive: Status post hysterectomy. No adnexal masses. Other: No free fluid or free gas.  No abdominal wall hernia. Musculoskeletal: Postsurgical changes are seen from ORIF of a proximal right femur fracture. Alignment is near anatomic. No other acute or destructive bony lesions. There is diffuse subcutaneous edema within the visualized left lower extremity, likely related to suspected DVT. Reconstructed images demonstrate no additional findings. IMPRESSION: 1. Punctate 2 mm nonobstructing left renal calculus. There is also a punctate 2 mm calculus within the bladder lumen. No obstructive uropathy within either kidney. 2. Suspected deep venous thrombosis involving the left lower extremity. Left lower extremity Doppler ultrasound recommended. 3. Large amount of stool within the rectal vault, consistent with fecal impaction. 4. Suspected gallbladder sludge. No evidence of cholelithiasis or cholecystitis. 5.  Aortic Atherosclerosis (ICD10-I70.0). Critical Value/emergent results were called by telephone at the time of interpretation on 04/26/2021 at 9:19 pm to provider [facility] , who verbally acknowledged these results. Electronically Signed   By: Randa Ngo M.D.   On: 04/26/2021 21:19   DG Chest 2 View  Result Date: 05/11/2021 CLINICAL DATA:  Pacemaker placement EXAM: CHEST - 2 VIEW COMPARISON:  04/26/2021 FINDINGS: Left pacer is in place with single lead tip in the right ventricle. No pneumothorax. Heart and mediastinal contours are within normal limits. No focal opacities or effusions. No acute bony abnormality. IMPRESSION: Left pacer placement.  No pneumothorax. Electronically Signed   By: Rolm Baptise M.D.   On: 05/11/2021 09:17   NM Hepatobiliary Liver Func  Result Date:  04/27/2021 CLINICAL DATA:  Abdominal pain, nausea, vomiting, diarrhea, constipation EXAM: NUCLEAR MEDICINE HEPATOBILIARY IMAGING TECHNIQUE: Sequential images of the abdomen were obtained out to 60 minutes following intravenous administration of radiopharmaceutical. RADIOPHARMACEUTICALS:  5.3 mCi Tc-97m  Choletec IV COMPARISON:  No direct comparison study. Correlation made with same day right upper quadrant ultrasound. FINDINGS: Prompt uptake and biliary excretion of activity by the liver is seen. Gallbladder activity is visualized, consistent with patency of cystic duct. Biliary imaging was aborted after 1 hour as the patient could not tolerate completing the study. Tracer is not seen in the small bowel, the tracer is seen in the large bowel. Gallbladder ejection fraction was not calculated. IMPRESSION: 1. Normal visualization of the gallbladder, excluding acute cholecystitis. 2. The patient could not tolerate completing the study, as such, ejection fraction and evaluation for chronic cholecystitis were not completed. Electronically Signed   By: Valetta Mole M.D.   On: 04/27/2021 14:41   PERIPHERAL VASCULAR CATHETERIZATION  Result Date: 05/04/2021 Images from the original result were not included. Patient name: Wendy Watts MRN: [id number] DOB: [birth year] Sex: female 05/04/2021 Pre-operative Diagnosis: Left leg DVT Post-operative diagnosis:  Same Surgeon:  [name] Procedure Performed:  1.  Ultrasound-guided access, left popliteal vein  2.  IVUS (intravascular ultrasound, left popliteal, femoral, common femoral, external iliac, and common iliac veins and inferior vena cava  3.  Mechanical venous thrombectomy using the INARI device of the inferior vena cava, left common iliac, external iliac, common femoral, femoral, and popliteal veins  4.  Balloon venoplasty of the inferior vena cava, left common iliac, external iliac, common femoral, femoral, popliteal vein  5.  Venography of the left popliteal, femoral,  common femoral, external iliac, and common iliac veins as well as the inferior vena cava  Indications: This is a 75 year old female with recent hip surgery who presented with a acute left leg DVT.  She comes in today for intervention. Procedure:  The patient was  identified in the holding area and taken to room 8.  The patient was then placed supine on the table and prepped and draped in the usual sterile fashion.  A time out was called.  Ultrasound was used to evaluate the left popliteal vein which was distended and full of thrombus.  The popliteal vein was then cannulated under ultrasound guidance with a micropuncture needle.  An 018 wire was advanced without resistance followed by placement of micropuncture sheath.  I then performed a quick puff of contrast to confirm that I was in the venous system and then advanced a Bentson wire centrally.  I then placed an 8 [place] sheath.  Next using a Berenstein 2 catheter and a Bentson wire, I was able to get the wire catheter up into the internal jugular vein.  I then switched out for an Amplatz wire and performed intravascular ultrasound of the left popliteal, femoral, common femoral, external iliac, and common iliac vein as well as AP vena cava.  This showed that there was thrombus in the infra vena cava extending up to the renal veins.  I then inserted the Inari sheath.  The patient was given an extra 5000 units of heparin.  Mechanical thrombectomy was then performed of the inferior vena cava, common iliac, external iliac, common femoral, femoral, and popliteal veins.  4 passes were performed.  Upon completion of rehab interrogated all of the veins with IVUS, and there did not appear to be any residual thrombus.  I then performed balloon venoplasty of the inferior vena cava, left common iliac, external iliac, common femoral, femoral, popliteal vein using a 12 x 80 Mustang balloon.  I then performed venography from the sheath up into the abdomen.  This showed no obvious  filling defects and inline flow.  There did appear to be some narrowing of the common iliac vein which I have predilated with a 12 mm balloon.  I did not want to stent this given that this was acute thrombus after surgery, and because of the patient's age and dementia.  At this point is very satisfied with the results.  The sheath was removed and the site closed with a 3-0 nylon and rubber bolster. Findings: Thrombosed extended up in the inferior vena cava to the renal veins.  Successful mechanical thrombectomy and balloon venoplasty  Impression:  #1  Successful mechanical venous thrombectomy of the infra vena cava, left common iliac, external iliac, common femoral, femoral, and popliteal veins using the INARI device.  I also performed balloon venoplasty using a 12 mm balloon.  #2  Reestablishment of inline flow through the iliofemoral venous system after thrombectomy and venoplasty  #3  The patient will need to be on anticoagulation for 3 to 6 months as this was a provoked DVT.  Her procedural dressings will be removed tomorrow or the next day and 20-30 thigh-high compression stockings will be placed V. [name], M.D., [facility] [place] Office: [phone] Pager:  [phone]   VAS [name] IVC/ILIAC (VENOUS ONLY)  Result Date: 04/27/2021 IVC/ILIAC STUDY Patient Name:  Wendy Watts  Date of Exam:   04/27/2021 Medical Rec #: [id number]      Accession #:    [id number] Date of Birth: May 14, 1946       Patient Gender: F Patient Age:   29 years Exam Location:  [facility] Procedure:      VAS [name] IVC/ILIAC (VENOUS ONLY) Referring Phys: Annie Main CHIU --------------------------------------------------------------------------------  Indications: DVT LLE Limitations: Air/bowel gas  and patient discomfort.  Comparison Study: CT of abdomen/pelvis showed enlarged left iliac system                   suggestive of DVT. Performing Technologist: Rogelia Rohrer RVT, RDMS  Examination Guidelines: A  complete evaluation includes B-mode imaging, spectral Doppler, color Doppler, and power Doppler as needed of all accessible portions of each vessel. Bilateral testing is considered an integral part of a complete examination. Limited examinations for reoccurring indications may be performed as noted.  IVC/Iliac Findings: +----------+------+-----------------+------------------------------------------+    IVC    Patent    Thrombus                      Comments                  +----------+------+-----------------+------------------------------------------+ IVC Prox                         Not visualized due to bowel gas & patient                                   discomfort                                 +----------+------+-----------------+------------------------------------------+ IVC Mid   patent                                                            +----------+------+-----------------+------------------------------------------+ IVC Distal      age indeterminate                                           +----------+------+-----------------+------------------------------------------+  +-------------------+---------+-----------+---------+-----------+--------+         CIV        RT-PatentRT-ThrombusLT-PatentLT-ThrombusComments +-------------------+---------+-----------+---------+-----------+--------+ Common Iliac Prox   patent                         acute            +-------------------+---------+-----------+---------+-----------+--------+ Common Iliac Mid    patent                         acute            +-------------------+---------+-----------+---------+-----------+--------+ Common Iliac Distal patent                         acute            +-------------------+---------+-----------+---------+-----------+--------+  +-------------------+---------+-----------+---------+-----------------+--------+         EIV        RT-PatentRT-ThrombusLT-Patent    LT-Thrombus   Comments +-------------------+---------+-----------+---------+-----------------+--------+ External Iliac Vein patent                      age indeterminate         Prox                                                                      +-------------------+---------+-----------+---------+-----------------+--------+  External Iliac Vein patent                      age indeterminate         Mid                                                                       +-------------------+---------+-----------+---------+-----------------+--------+ External Iliac Vein patent                      age indeterminate         Distal                                                                    +-------------------+---------+-----------+---------+-----------------+--------+   Summary: IVC/Iliac: There is evidence of age indeterminate thrombus involving the IVC. There is no evidence of thrombus involving the right common iliac vein. There is evidence of acute thrombus involving the left common iliac vein. There is no evidence of thrombus involving the right external iliac vein. There is evidence of age indeterminate thrombus involving the left external iliac vein.  *See table(s) above for measurements and observations.  Electronically signed by Harold Barban MD on 04/27/2021 at 7:11:37 PM.    Final    DG Chest Portable 1 View  Result Date: 04/26/2021 CLINICAL DATA:  Right-sided abdominal pain and left leg swelling. EXAM: PORTABLE CHEST 1 VIEW COMPARISON:  March 28, 2021 FINDINGS: Very mild atelectasis is seen within the lateral aspect of the left lung base. There is no evidence of a pleural effusion or pneumothorax. The heart size and mediastinal contours are within normal limits. The visualized skeletal structures are unremarkable. IMPRESSION: Very mild left basilar atelectasis. Electronically Signed   By: Virgina Norfolk M.D.   On: 04/26/2021 21:48   DG  Swallowing Func-Speech Pathology  Result Date: 05/03/2021 Table formatting from the original result was not included. Objective Swallowing Evaluation: Type of Study: MBS-Modified Barium Swallow Study  Patient Details Name: STEPFANIE YOTT MRN: [id number] Date of Birth: [birth year] Today's Date: 05/03/2021 Time: SLP Start Time (ACUTE ONLY): 1330 -SLP Stop Time (ACUTE ONLY): 1400 SLP Time Calculation (min) (ACUTE ONLY): 30 min Past Medical History: Past Medical History: Diagnosis Date  Anxiety   Dementia ([place])   High cholesterol   Vertigo  Past Surgical History: Past Surgical History: Procedure Laterality Date  HYSTERECTOMY    INTRAMEDULLARY (IM) NAIL INTERTROCHANTERIC Right 03/28/2021  Procedure: INTRAMEDULLARY (IM) NAIL INTERTROCHANTRIC;  Surgeon: Meredith Pel, MD;  Location: [facility];  Service: Orthopedics;  Laterality: Right;  TOTAL KNEE ARTHROPLASTY Right  HPI: Pt is a 75 y.o. female who was admitted secondary to nausea, vomiting and left lower extremity swelling. Pt's daughter reported to MD that pt has been having poor appetite and failure to thrive syndrome since recent surgery. CXR 9/12: Very mild left basilar atelectasis. PMH: advanced dementia, recent right hip fracture-s/p ORIF on 8/14  Subjective: alert, difficult to convince to participate Assessment / Plan / Recommendation CHL IP CLINICAL  IMPRESSIONS 05/03/2021 Clinical Impression Limited MBS completed due to pt's reluctance to participate; therefore minimal consistencies and quantities could be assessed.  She demonstrated functional mastication of a small portion of cracker and 1/2 tspn of pureed barium with normal onset of the swallow and no obvious deficits.  She allowed sips of thin barium, demonstrating high penetration x1 (Penetration/aspiration score of 2, considered WFL) and one incident of penetration to the vocal folds when throwing her head into extension. There was no aspiration.  Pt was noted to cough throughout the exam - with both solids  and liquids- which was not correlated with swallow dysfunction/airway invasion.  Notable on one occasion was backflow of thin liquids into the cervical esophagus - no hx of GERD or esophageal deficits in chart.  Recommend continuing with her current diet given no evidence of aspiration.  Will f/u x1 for education. SLP Visit Diagnosis Dysphagia, unspecified (R13.10) Attention and concentration deficit following -- Frontal lobe and executive function deficit following -- Impact on safety and function --   CHL IP TREATMENT RECOMMENDATION 05/03/2021 Treatment Recommendations Therapy as outlined in treatment plan below   Prognosis 05/03/2021 Prognosis for Safe Diet Advancement Fair Barriers to Reach Goals Cognitive deficits;Motivation Barriers/Prognosis Comment -- CHL IP DIET RECOMMENDATION 05/03/2021 SLP Diet Recommendations Dysphagia 3 (Mech soft) solids;Thin liquid Liquid Administration via Cup;Straw Medication Administration Crushed with puree Compensations Minimize environmental distractions Postural Changes --   CHL IP OTHER RECOMMENDATIONS 05/03/2021 Recommended Consults -- Oral Care Recommendations Oral care BID Other Recommendations --   CHL IP FOLLOW UP RECOMMENDATIONS 05/03/2021 Follow up Recommendations 24 hour supervision/assistance   CHL IP FREQUENCY AND DURATION 05/03/2021 Speech Therapy Frequency (ACUTE ONLY) min 1 x/week Treatment Duration 1 week      CHL IP ORAL PHASE 05/03/2021 Oral Phase WFL Oral - Pudding Teaspoon -- Oral - Pudding Cup -- Oral - Honey Teaspoon -- Oral - Honey Cup -- Oral - Nectar Teaspoon -- Oral - Nectar Cup -- Oral - Nectar Straw -- Oral - Thin Teaspoon -- Oral - Thin Cup -- Oral - Thin Straw -- Oral - Puree -- Oral - Mech Soft -- Oral - Regular WFL Oral - Multi-Consistency -- Oral - Pill -- Oral Phase - Comment --  CHL IP PHARYNGEAL PHASE 05/03/2021 Pharyngeal Phase WFL Pharyngeal- Pudding Teaspoon -- Pharyngeal -- Pharyngeal- Pudding Cup -- Pharyngeal -- Pharyngeal- Honey Teaspoon --  Pharyngeal -- Pharyngeal- Honey Cup -- Pharyngeal -- Pharyngeal- Nectar Teaspoon -- Pharyngeal -- Pharyngeal- Nectar Cup -- Pharyngeal -- Pharyngeal- Nectar Straw -- Pharyngeal -- Pharyngeal- Thin Teaspoon -- Pharyngeal -- Pharyngeal- Thin Cup -- Pharyngeal -- Pharyngeal- Thin Straw -- Pharyngeal -- Pharyngeal- Puree -- Pharyngeal -- Pharyngeal- Mechanical Soft -- Pharyngeal -- Pharyngeal- Regular -- Pharyngeal -- Pharyngeal- Multi-consistency -- Pharyngeal -- Pharyngeal- Pill -- Pharyngeal -- Pharyngeal Comment --  No flowsheet data found. [name] 05/03/2021, 2:48 PM              ECHOCARDIOGRAM COMPLETE  Result Date: 05/09/2021    ECHOCARDIOGRAM REPORT   Patient Name:   Wendy Watts Date of Exam: 05/09/2021 Medical Rec #:  [id number]     Height:       65.0 in Accession #:    [id number]    Weight:       142.4 lb Date of Birth:  Aug 20, 1945      BSA:          1.712 m Patient Age:    13 years  BP:           136/62 mmHg Patient Gender: F             HR:           81 bpm. Exam Location:  Inpatient Procedure: 2D Echo Indications:    abnormal ecg  History:        Patient has no prior history of Echocardiogram examinations.                 Signs/Symptoms:dementia.  Sonographer:    Johny Chess RDCS Referring Phys: [place] Comments: Image acquisition challenging due to uncooperative patient. IMPRESSIONS  1. Left ventricular ejection fraction, by estimation, is 70 to 75%. The left ventricle has hyperdynamic function. The left ventricle has no regional wall motion abnormalities. Left ventricular diastolic parameters are consistent with Grade I diastolic dysfunction (impaired relaxation).  2. Right ventricular systolic function is normal. The right ventricular size is normal.  3. The mitral valve is normal in structure. No evidence of mitral valve regurgitation. No evidence of mitral stenosis.  4. The aortic valve is normal in structure. Aortic valve regurgitation is not  visualized. No aortic stenosis is present.  5. The inferior vena cava is normal in size with greater than 50% respiratory variability, suggesting right atrial pressure of 3 mmHg. FINDINGS  Left Ventricle: Left ventricular ejection fraction, by estimation, is 70 to 75%. The left ventricle has hyperdynamic function. The left ventricle has no regional wall motion abnormalities. The left ventricular internal cavity size was normal in size. There is no left ventricular hypertrophy. Left ventricular diastolic parameters are consistent with Grade I diastolic dysfunction (impaired relaxation). Right Ventricle: The right ventricular size is normal. No increase in right ventricular wall thickness. Right ventricular systolic function is normal. Left Atrium: Left atrial size was normal in size. Right Atrium: Right atrial size was normal in size. Pericardium: There is no evidence of pericardial effusion. Mitral Valve: The mitral valve is normal in structure. No evidence of mitral valve regurgitation. No evidence of mitral valve stenosis. Tricuspid Valve: The tricuspid valve is normal in structure. Tricuspid valve regurgitation is not demonstrated. No evidence of tricuspid stenosis. Aortic Valve: The aortic valve is normal in structure. Aortic valve regurgitation is not visualized. No aortic stenosis is present. Pulmonic Valve: The pulmonic valve was normal in structure. Pulmonic valve regurgitation is not visualized. No evidence of pulmonic stenosis. Aorta: The aortic root is normal in size and structure. Venous: The inferior vena cava is normal in size with greater than 50% respiratory variability, suggesting right atrial pressure of 3 mmHg. IAS/Shunts: No atrial level shunt detected by color flow Doppler.  LEFT VENTRICLE PLAX 2D LVIDd:         3.50 cm  Diastology LVIDs:         2.10 cm  LV e' medial:    5.77 cm/s LV PW:         0.80 cm  LV E/e' medial:  8.6 LV IVS:        0.70 cm  LV e' lateral:   6.74 cm/s LVOT diam:     1.60  cm  LV E/e' lateral: 7.4 LV SV:         33 LV SV Index:   19 LVOT Area:     2.01 cm  RIGHT VENTRICLE RV S prime:     13.60 cm/s TAPSE (M-mode): 1.3 cm LEFT ATRIUM  Index LA diam:      2.80 cm 1.64 cm/m LA Vol (A4C): 21.1 ml 12.32 ml/m  AORTIC VALVE LVOT Vmax:   115.00 cm/s LVOT Vmean:  70.600 cm/s LVOT VTI:    0.166 m  AORTA Ao Root diam: 2.60 cm Ao Asc diam:  2.80 cm MITRAL VALVE MV Area (PHT): 2.91 cm    SHUNTS MV Decel Time: 261 msec    Systemic VTI:  0.17 m MV E velocity: 49.80 cm/s  Systemic Diam: 1.60 cm MV A velocity: 79.30 cm/s MV E/A ratio:  0.63 Candee Furbish MD Electronically signed by Candee Furbish MD Signature Date/Time: 05/09/2021/11:09:59 AM    Final    XR FEMUR, MIN 2 VIEWS RIGHT  Result Date: 04/19/2021 AP and lateral views of right femur reviewed.  No significant change in position of IMH S  hardware or fracture alignment since operative radiographs.  No interval fracture noted.  Minimal callus present  VAS [name] LOWER EXTREMITY VENOUS (DVT) (ONLY [facility] & [facility])  Result Date: 04/27/2021  Lower Venous DVT Study Patient Name:  Wendy Watts  Date of Exam:   04/27/2021 Medical Rec #: [id number]      Accession #:    [id number] Date of Birth: [birth year]       Patient Gender: F Patient Age:   42 years Exam Location:  [facility] Procedure:      VAS [name] LOWER EXTREMITY VENOUS (DVT) Referring Phys: Gean Birchwood --------------------------------------------------------------------------------  Indications: Swelling. Other Indications: Abnormal CT of abdomen/pelvis (enlarged left iliac venous                    system in comparison to right). Risk Factors: Limited mobility post RT hip fracture Surgery Repair of RT hip FX (03/2021) Trauma Fall with RT hip FX 03/2021. Performing Technologist: Rogelia Rohrer RVT, RDMS  Examination Guidelines: A complete evaluation includes B-mode imaging, spectral Doppler, color Doppler, and power Doppler as needed of all accessible portions of each vessel. Bilateral  testing is considered an integral part of a complete examination. Limited examinations for reoccurring indications may be performed as noted. The reflux portion of the exam is performed with the patient in reverse Trendelenburg.  +-----+---------------+---------+-----------+----------+--------------+ RIGHTCompressibilityPhasicitySpontaneityPropertiesThrombus Aging +-----+---------------+---------+-----------+----------+--------------+ CFV  Full           Yes      Yes                                 +-----+---------------+---------+-----------+----------+--------------+   +---------+---------------+---------+-----------+----------+------------------+ LEFT     CompressibilityPhasicitySpontaneityPropertiesThrombus Aging     +---------+---------------+---------+-----------+----------+------------------+ CFV      None           No       No                   Acute              +---------+---------------+---------+-----------+----------+------------------+ SFJ      None                                         Acute              +---------+---------------+---------+-----------+----------+------------------+ FV Prox  None           No       No  Acute              +---------+---------------+---------+-----------+----------+------------------+ FV Mid   None           No       No                   Acute              +---------+---------------+---------+-----------+----------+------------------+ FV DistalNone           No       No                   Acute              +---------+---------------+---------+-----------+----------+------------------+ PFV      None           No       No                   Acute              +---------+---------------+---------+-----------+----------+------------------+ POP      None           No       No                   Acute               +---------+---------------+---------+-----------+----------+------------------+ PTV      None           No       No                   Acute              +---------+---------------+---------+-----------+----------+------------------+ PERO     None           No       No                   Acute              +---------+---------------+---------+-----------+----------+------------------+ Gastroc  None           No       No                   Acute              +---------+---------------+---------+-----------+----------+------------------+ GSV      None           No       No                   Acute - prox/mid                                                         thigh              +---------+---------------+---------+-----------+----------+------------------+     Summary: RIGHT: - No evidence of common femoral vein obstruction.  LEFT: - Findings consistent with acute deep vein thrombosis involving the left common femoral vein, SF junction, left femoral vein, left proximal profunda vein, left popliteal vein, left posterior tibial veins, left peroneal veins, and left gastrocnemius veins. - Findings consistent with acute superficial vein thrombosis involving the left great saphenous vein. - No cystic structure found in  the popliteal fossa. Subcutaneous edema throughout left lower extremity.  *See table(s) above for measurements and observations. Electronically signed by Harold Barban MD on 04/27/2021 at 7:12:31 PM.    Final    US Abdomen Limited RUQ (LIVER/GB)  Result Date: 04/27/2021 CLINICAL DATA:  Nausea and vomiting. EXAM: ULTRASOUND ABDOMEN LIMITED RIGHT UPPER QUADRANT COMPARISON:  May 14, 2012 FINDINGS: Gallbladder: A mild amount of heterogeneous echogenic sludge is seen within the gallbladder lumen. No gallstones or wall thickening visualized (2.0 mm). A positive sonographic Murphy's sign is noted by sonographer. Common bile duct: Diameter: 3.0 mm Liver: No focal lesion  identified. Diffusely increased echogenicity of the liver parenchyma is noted. Portal vein is patent on color Doppler imaging with normal direction of blood flow towards the liver. Other: None. IMPRESSION: 1. Echogenic sludge and a positive sonographic Murphy's sign in the absence of cholelithiasis. Further evaluation with a nuclear medicine hepatobiliary scan is recommended, as acalculous cholecystitis can not completely be excluded. 2. Fatty liver. Electronically Signed   By: Virgina Norfolk M.D.   On: 04/27/2021 00:07    Micro Results     Recent Results (from the past 240 hour(s))  Surgical PCR screen     Status: None   Collection Time: 05/09/21 12:20 PM   Specimen: Nasal Mucosa; Nasal Swab  Result Value Ref Range Status   MRSA, PCR NEGATIVE NEGATIVE Final   Staphylococcus aureus NEGATIVE NEGATIVE Final    Comment: (NOTE) The Xpert SA Assay (FDA approved for NASAL specimens in patients 28 years of age and older), is one component of a comprehensive surveillance program. It is not intended to diagnose infection nor to guide or monitor treatment. Performed at [facility], [place] [address], [place], [place] [address]     Today   Subjective    Maryan Sivak today has no headache,no chest abdominal pain,no new weakness tingling or numbness, feels much better wants to go home today.     Objective   Blood pressure 101/60, pulse 77, temperature 98.1 F (36.7 C), temperature source Oral, resp. rate 18, height 5\' 5"  (1.651 m), weight 64.6 kg, SpO2 95 %.   Intake/Output Summary (Last 24 hours) at 05/12/2021 1042 Last data filed at 05/11/2021 2300 Gross per 24 hour  Intake 120 ml  Output 600 ml  Net -480 ml    Exam  Awake, pleasantly confused, no focal deficits  [place].AT,PERRAL Supple Neck,No JVD, No cervical lymphadenopathy appriciated.  Symmetrical Chest wall movement, Good air movement bilaterally, CTAB RRR,No Gallops,Rubs or new Murmurs, No Parasternal Heave +ve B.Sounds,  Abd Soft, Non tender, No organomegaly appriciated, No rebound -guarding or rigidity. No Cyanosis, L arm in sling   Data Review   CBC w Diff:  Lab Results  Component Value Date   WBC 6.0 05/10/2021   HGB 10.7 (L) 05/10/2021   HGB 13.7 05/08/2019   HCT 33.2 (L) 05/10/2021   HCT 41.4 05/08/2019   PLT 284 05/10/2021   PLT 357 05/08/2019   LYMPHOPCT 3 04/27/2021   MONOPCT 7 04/27/2021   EOSPCT 0 04/27/2021   BASOPCT 0 04/27/2021    CMP:  Lab Results  Component Value Date   NA 138 05/10/2021   NA 142 05/08/2019   K 3.6 05/10/2021   CL 106 05/10/2021   CO2 26 05/10/2021   BUN 9 05/10/2021   BUN 11 05/08/2019   CREATININE 0.82 05/10/2021   PROT 5.5 (L) 04/28/2021   PROT 6.8 05/08/2019   ALBUMIN 2.5 (L) 04/28/2021  ALBUMIN 4.1 05/08/2019   BILITOT 1.1 04/28/2021   BILITOT 0.6 05/08/2019   ALKPHOS 109 04/28/2021   AST 16 04/28/2021   ALT 17 04/28/2021  .   Total Time in preparing paper work, data evaluation and todays exam - 64 minutes  Lala Lund M.D on 05/12/2021 at 10:42 AM  [facility]

## 2021-05-13 DIAGNOSIS — Z950 Presence of cardiac pacemaker: Secondary | ICD-10-CM | POA: Diagnosis not present

## 2021-05-13 DIAGNOSIS — F0391 Unspecified dementia with behavioral disturbance: Secondary | ICD-10-CM | POA: Diagnosis not present

## 2021-05-13 DIAGNOSIS — S72001S Fracture of unspecified part of neck of right femur, sequela: Secondary | ICD-10-CM | POA: Diagnosis not present

## 2021-05-13 DIAGNOSIS — F411 Generalized anxiety disorder: Secondary | ICD-10-CM | POA: Diagnosis not present

## 2021-05-13 DIAGNOSIS — E782 Mixed hyperlipidemia: Secondary | ICD-10-CM | POA: Diagnosis not present

## 2021-05-13 DIAGNOSIS — Z4732 Aftercare following explantation of hip joint prosthesis: Secondary | ICD-10-CM | POA: Diagnosis not present

## 2021-05-13 DIAGNOSIS — Z7901 Long term (current) use of anticoagulants: Secondary | ICD-10-CM | POA: Diagnosis not present

## 2021-05-13 DIAGNOSIS — D62 Acute posthemorrhagic anemia: Secondary | ICD-10-CM | POA: Diagnosis not present

## 2021-05-13 DIAGNOSIS — K5901 Slow transit constipation: Secondary | ICD-10-CM | POA: Diagnosis not present

## 2021-05-13 DIAGNOSIS — Z9181 History of falling: Secondary | ICD-10-CM | POA: Diagnosis not present

## 2021-05-17 DIAGNOSIS — Z950 Presence of cardiac pacemaker: Secondary | ICD-10-CM | POA: Diagnosis not present

## 2021-05-17 DIAGNOSIS — E782 Mixed hyperlipidemia: Secondary | ICD-10-CM | POA: Diagnosis not present

## 2021-05-17 DIAGNOSIS — Z9181 History of falling: Secondary | ICD-10-CM | POA: Diagnosis not present

## 2021-05-17 DIAGNOSIS — F03911 Unspecified dementia, unspecified severity, with agitation: Secondary | ICD-10-CM | POA: Diagnosis not present

## 2021-05-17 DIAGNOSIS — F411 Generalized anxiety disorder: Secondary | ICD-10-CM | POA: Diagnosis not present

## 2021-05-17 DIAGNOSIS — K5901 Slow transit constipation: Secondary | ICD-10-CM | POA: Diagnosis not present

## 2021-05-17 DIAGNOSIS — D62 Acute posthemorrhagic anemia: Secondary | ICD-10-CM | POA: Diagnosis not present

## 2021-05-17 DIAGNOSIS — S72001S Fracture of unspecified part of neck of right femur, sequela: Secondary | ICD-10-CM | POA: Diagnosis not present

## 2021-05-17 DIAGNOSIS — Z7901 Long term (current) use of anticoagulants: Secondary | ICD-10-CM | POA: Diagnosis not present

## 2021-05-17 DIAGNOSIS — Z4732 Aftercare following explantation of hip joint prosthesis: Secondary | ICD-10-CM | POA: Diagnosis not present

## 2021-05-20 ENCOUNTER — Ambulatory Visit: Payer: PPO

## 2021-05-20 ENCOUNTER — Other Ambulatory Visit: Payer: Self-pay

## 2021-05-20 ENCOUNTER — Ambulatory Visit (INDEPENDENT_AMBULATORY_CARE_PROVIDER_SITE_OTHER): Payer: PPO

## 2021-05-20 DIAGNOSIS — R55 Syncope and collapse: Secondary | ICD-10-CM | POA: Diagnosis not present

## 2021-05-20 LAB — CUP PACEART INCLINIC DEVICE CHECK
Date Time Interrogation Session: 20221006114632
Implantable Lead Implant Date: 20220926
Implantable Lead Location: 753860
Implantable Lead Model: 377171
Implantable Lead Serial Number: 8000535066
Implantable Pulse Generator Implant Date: 20220926
Lead Channel Impedance Value: 585 Ohm
Lead Channel Pacing Threshold Amplitude: 0.9 V
Lead Channel Pacing Threshold Amplitude: 1 V
Lead Channel Pacing Threshold Amplitude: 1 V
Lead Channel Pacing Threshold Pulse Width: 0.4 ms
Lead Channel Pacing Threshold Pulse Width: 0.4 ms
Lead Channel Pacing Threshold Pulse Width: 0.4 ms
Lead Channel Sensing Intrinsic Amplitude: 10.4 mV
Lead Channel Sensing Intrinsic Amplitude: 10.6 mV
Lead Channel Setting Pacing Amplitude: 3 V
Lead Channel Setting Pacing Pulse Width: 0.4 ms
Pulse Gen Model: 407157
Pulse Gen Serial Number: 70243330

## 2021-05-20 NOTE — Patient Instructions (Signed)

## 2021-05-20 NOTE — Progress Notes (Signed)
Wound check appointment. Steri-strips removed. Wound without redness or edema. Incision edges approximated, wound well healed. Normal device function. Threshold, sensing, and impedances consistent with implant measurements. Device programmed at with auto capture programmed on for extra safety margin until 3 month visit. Histogram distribution appropriate for patient and level of activity. No high ventricular rates noted. Patient's daughter educated about wound care, arm mobility, lifting restrictions. ROV 08/12/21 with CL.

## 2021-05-21 DIAGNOSIS — S72001A Fracture of unspecified part of neck of right femur, initial encounter for closed fracture: Secondary | ICD-10-CM | POA: Diagnosis not present

## 2021-05-21 DIAGNOSIS — F0390 Unspecified dementia without behavioral disturbance: Secondary | ICD-10-CM | POA: Diagnosis not present

## 2021-05-21 DIAGNOSIS — S72001S Fracture of unspecified part of neck of right femur, sequela: Secondary | ICD-10-CM | POA: Diagnosis not present

## 2021-05-21 DIAGNOSIS — Z9181 History of falling: Secondary | ICD-10-CM | POA: Diagnosis not present

## 2021-05-22 ENCOUNTER — Other Ambulatory Visit: Payer: Self-pay

## 2021-05-22 DIAGNOSIS — I824Y2 Acute embolism and thrombosis of unspecified deep veins of left proximal lower extremity: Secondary | ICD-10-CM

## 2021-06-07 ENCOUNTER — Other Ambulatory Visit: Payer: Self-pay

## 2021-06-07 ENCOUNTER — Ambulatory Visit (INDEPENDENT_AMBULATORY_CARE_PROVIDER_SITE_OTHER): Payer: PPO | Admitting: Surgery

## 2021-06-07 ENCOUNTER — Encounter: Payer: Self-pay | Admitting: Surgery

## 2021-06-07 ENCOUNTER — Ambulatory Visit (INDEPENDENT_AMBULATORY_CARE_PROVIDER_SITE_OTHER)
Admit: 2021-06-07 | Discharge: 2021-06-07 | Disposition: A | Discharge status: Home / Self Care | Payer: PPO | Attending: Surgery | Admitting: Surgery

## 2021-06-07 ENCOUNTER — Ambulatory Visit (HOSPITAL_COMMUNITY)
Admission: RE | Admit: 2021-06-07 | Discharge: 2021-06-07 | Disposition: A | Discharge status: Home / Self Care | Payer: PPO | Source: Ambulatory Visit | Attending: Surgery | Admitting: Surgery

## 2021-06-07 VITALS — BP 134/88 | HR 87 | Temp 97.9°F | Resp 20 | Ht 65.0 in | Wt 140.0 lb

## 2021-06-07 DIAGNOSIS — I824Y2 Acute embolism and thrombosis of unspecified deep veins of left proximal lower extremity: Secondary | ICD-10-CM | POA: Diagnosis not present

## 2021-06-07 DIAGNOSIS — M7989 Other specified soft tissue disorders: Secondary | ICD-10-CM

## 2021-06-07 NOTE — Progress Notes (Signed)
[facility] [place]  Patient name: Wendy Watts MRN: [id number] DOB: 09-Mar-1946 Sex: female   REASON FOR VISIT:    Follow up  [place] ILLNESS:    Wendy Watts is a 75 y.o. female who presented on 04/27/2021 with a left-sided DVT with extension into the inferior vena cava..  She was status post right hip surgery on 03/29/2021 after a fall.  On 05/04/2021, she underwent mechanical thrombectomy of the IVC and iliac veins as well as balloon venoplasty.  She did end up getting a pacemaker 1 week later.  She is now waling with a walker.  She remains on Eliquis   PAST MEDICAL HISTORY:   Past Medical History:  Diagnosis Date   Anxiety    Dementia ([place])    High cholesterol    Vertigo      FAMILY HISTORY:   Family History  Problem Relation Age of Onset   Dementia Neg Hx    Alzheimer's disease Neg Hx     SOCIAL HISTORY:   Social History   Tobacco Use   Smoking status: Never   Smokeless tobacco: Never  Substance Use Topics   Alcohol use: Never     ALLERGIES:   Allergies  Allergen Reactions   Allegra [Fexofenadine Hcl] Other (See Comments)    "takes me to another world"   Ativan [Lorazepam] Other (See Comments)    Hallucinations   Azithromycin Nausea And Vomiting   Codeine Hives   Doxycycline Hyclate Nausea Only   Penicillins Hives    Has patient had a PCN reaction causing immediate rash, facial/tongue/throat swelling, SOB or lightheadedness with hypotension: no Has patient had a PCN reaction causing severe rash involving mucus membranes or skin necrosis: no Has patient had a PCN reaction that required hospitalization: no Has patient had a PCN reaction occurring within the last 10 years: no If all of the above answers are "NO", then may proceed with Cephalosporin use.    Trazodone And Nefazodone Nausea Only   Zyrtec [Cetirizine] Other (See Comments)    "takes me to another world"     CURRENT  MEDICATIONS:   Current Outpatient Medications  Medication Sig Dispense Refill   acetaminophen (TYLENOL) 500 MG tablet Take 1 tablet (500 mg total) by mouth every 8 (eight) hours as needed for moderate pain. 20 tablet 0   apixaban (ELIQUIS) 5 MG TABS tablet Take 1 tablet (5 mg total) by mouth 2 (two) times daily. 60 tablet 0   busPIRone (BUSPAR) 10 MG tablet Take 10 mg by mouth 2 (two) times daily.     donepezil (ARICEPT) 10 MG tablet TAKE 1 TABLET(10 MG) BY MOUTH AT BEDTIME (Patient taking differently: Take 10 mg by mouth at bedtime.) 90 tablet 3   fluticasone (FLONASE) 50 MCG/ACT nasal spray Place 2 sprays into both nostrils daily as needed for allergies or rhinitis.     memantine (NAMENDA) 10 MG tablet Take 1 tablet (10 mg total) by mouth 2 (two) times daily. 180 tablet 3   polyethylene glycol (MIRALAX / GLYCOLAX) 17 g packet Take 17 g by mouth daily as needed. 14 each 0   vitamin B-12 (CYANOCOBALAMIN) 1000 MCG tablet Take 1 tablet (1,000 mcg total) by mouth daily. 30 tablet 0   VITAMIN D PO Take 5,000 Units by mouth daily.     Wheat Dextrin (BENEFIBER DRINK MIX) PACK Take 1 Scoop by mouth daily. Mix in applesauce     No current facility-administered medications for this  visit.    REVIEW OF SYSTEMS:   [X]  denotes positive finding, [ ]  denotes negative finding Cardiac  Comments:  Chest pain or chest pressure: x   Shortness of breath upon exertion:    Short of breath when lying flat:    Irregular heart rhythm:        Vascular    Pain in calf, thigh, or hip brought on by ambulation:    Pain in feet at night that wakes you up from your sleep:     Blood clot in your veins:    Leg swelling:         Pulmonary    Oxygen at home:    Productive cough:     Wheezing:         Neurologic    Sudden weakness in arms or legs:     Sudden numbness in arms or legs:     Sudden onset of difficulty speaking or slurred speech:    Temporary loss of vision in one eye:     Problems with dizziness:          Gastrointestinal    Blood in stool:     Vomited blood:         Genitourinary    Burning when urinating:     Blood in urine:        Psychiatric    Major depression:         Hematologic    Bleeding problems:    Problems with blood clotting too easily:        Skin    Rashes or ulcers:        Constitutional    Fever or chills:      PHYSICAL EXAM:   Vitals:   06/07/21 0922  BP: 134/88  Pulse: 87  Resp: 20  Temp: 97.9 F (36.6 C)  SpO2: 96%  Weight: 63.5 kg  Height: 5\' 5"  (1.651 m)    GENERAL: The patient is a well-nourished female, in no acute distress. The vital signs are documented above. CARDIAC: There is a regular rate and rhythm.  VASCULAR: trace to 1+ edema. On the left leg.  Palpable pedal pulses PULMONARY: Non-labored respirations  MUSCULOSKELETAL: There are no major deformities or cyanosis. NEUROLOGIC: No focal weakness or paresthesias are detected. SKIN: There are no ulcers or rashes noted. PSYCHIATRIC: The patient has a normal affect.  STUDIES:   I have reviewed the following vascular studies: IVC iliac: IVC/Iliac: No evidence of thrombus in IVC and Iliac veins. No.     Left reflux: Left:  - Venous reflux is noted in the left greater saphenous vein in the thigh.  - Venous reflux is noted in the left femoral vein.  - Venous reflux is noted in the left short saphenous vein MEDICAL ISSUES:   Postoperative left leg DVT: The patient underwent successful mechanical thrombectomy.  She now is on Eliquis.  Her leg edema is dramatically improved.  She now has trace to 1+ edema.  Unfortunately, she is wearing a TED hose.  I have recommended 15-20 compression socks as I think she is going to have difficulty getting them on.  Overall she has made excellent progress.  I told her daughter that I would give her at minimum 3 months of anticoagulation, and longer if she is not back to an adequate activity level.  She will contact me if she has any further  concerns or questions.  Otherwise I will see her back on an as-needed basis.  Leia Alf, MD, FACS [facility] [phone] Pager [phone]

## 2021-06-15 ENCOUNTER — Other Ambulatory Visit: Payer: Self-pay

## 2021-06-15 ENCOUNTER — Telehealth: Payer: Self-pay

## 2021-06-15 DIAGNOSIS — S72001S Fracture of unspecified part of neck of right femur, sequela: Secondary | ICD-10-CM | POA: Diagnosis not present

## 2021-06-15 DIAGNOSIS — Z7901 Long term (current) use of anticoagulants: Secondary | ICD-10-CM | POA: Diagnosis not present

## 2021-06-15 DIAGNOSIS — K5901 Slow transit constipation: Secondary | ICD-10-CM | POA: Diagnosis not present

## 2021-06-15 DIAGNOSIS — F03911 Unspecified dementia, unspecified severity, with agitation: Secondary | ICD-10-CM | POA: Diagnosis not present

## 2021-06-15 DIAGNOSIS — D62 Acute posthemorrhagic anemia: Secondary | ICD-10-CM | POA: Diagnosis not present

## 2021-06-15 DIAGNOSIS — F411 Generalized anxiety disorder: Secondary | ICD-10-CM | POA: Diagnosis not present

## 2021-06-15 DIAGNOSIS — Z9181 History of falling: Secondary | ICD-10-CM | POA: Diagnosis not present

## 2021-06-15 DIAGNOSIS — E782 Mixed hyperlipidemia: Secondary | ICD-10-CM | POA: Diagnosis not present

## 2021-06-15 DIAGNOSIS — Z950 Presence of cardiac pacemaker: Secondary | ICD-10-CM | POA: Diagnosis not present

## 2021-06-15 DIAGNOSIS — Z4732 Aftercare following explantation of hip joint prosthesis: Secondary | ICD-10-CM | POA: Diagnosis not present

## 2021-06-15 MED ORDER — APIXABAN 5 MG PO TABS: 5.0000 mg | ORAL_TABLET | Freq: Two times a day (BID) | ORAL | 0 refills | Status: DC

## 2021-06-15 NOTE — Telephone Encounter (Signed)
[name] called to request refill on Eliquis. She has an appt scheduled with her PCP next week. Refill has been sent in to pt's pharmacy. No questions/concerns at this time.

## 2021-06-21 DIAGNOSIS — S72001A Fracture of unspecified part of neck of right femur, initial encounter for closed fracture: Secondary | ICD-10-CM | POA: Diagnosis not present

## 2021-06-21 DIAGNOSIS — Z9181 History of falling: Secondary | ICD-10-CM | POA: Diagnosis not present

## 2021-06-21 DIAGNOSIS — S72001S Fracture of unspecified part of neck of right femur, sequela: Secondary | ICD-10-CM | POA: Diagnosis not present

## 2021-06-21 DIAGNOSIS — F0390 Unspecified dementia without behavioral disturbance: Secondary | ICD-10-CM | POA: Diagnosis not present

## 2021-06-24 DIAGNOSIS — E559 Vitamin D deficiency, unspecified: Secondary | ICD-10-CM | POA: Diagnosis not present

## 2021-06-24 DIAGNOSIS — R451 Restlessness and agitation: Secondary | ICD-10-CM | POA: Diagnosis not present

## 2021-06-24 DIAGNOSIS — Z23 Encounter for immunization: Secondary | ICD-10-CM | POA: Diagnosis not present

## 2021-06-24 DIAGNOSIS — Z79899 Other long term (current) drug therapy: Secondary | ICD-10-CM | POA: Diagnosis not present

## 2021-06-24 DIAGNOSIS — E785 Hyperlipidemia, unspecified: Secondary | ICD-10-CM | POA: Diagnosis not present

## 2021-06-24 DIAGNOSIS — Z86718 Personal history of other venous thrombosis and embolism: Secondary | ICD-10-CM | POA: Diagnosis not present

## 2021-06-24 DIAGNOSIS — N179 Acute kidney failure, unspecified: Secondary | ICD-10-CM | POA: Diagnosis not present

## 2021-06-24 DIAGNOSIS — E46 Unspecified protein-calorie malnutrition: Secondary | ICD-10-CM | POA: Diagnosis not present

## 2021-06-24 DIAGNOSIS — Z09 Encounter for follow-up examination after completed treatment for conditions other than malignant neoplasm: Secondary | ICD-10-CM | POA: Diagnosis not present

## 2021-06-24 DIAGNOSIS — Z8781 Personal history of (healed) traumatic fracture: Secondary | ICD-10-CM | POA: Diagnosis not present

## 2021-06-24 DIAGNOSIS — E538 Deficiency of other specified B group vitamins: Secondary | ICD-10-CM | POA: Diagnosis not present

## 2021-07-12 ENCOUNTER — Other Ambulatory Visit: Payer: Self-pay | Admitting: Surgery

## 2021-07-14 DIAGNOSIS — Z0000 Encounter for general adult medical examination without abnormal findings: Secondary | ICD-10-CM | POA: Diagnosis not present

## 2021-07-21 DIAGNOSIS — Z9181 History of falling: Secondary | ICD-10-CM | POA: Diagnosis not present

## 2021-07-21 DIAGNOSIS — F0390 Unspecified dementia without behavioral disturbance: Secondary | ICD-10-CM | POA: Diagnosis not present

## 2021-07-21 DIAGNOSIS — S72001A Fracture of unspecified part of neck of right femur, initial encounter for closed fracture: Secondary | ICD-10-CM | POA: Diagnosis not present

## 2021-07-21 DIAGNOSIS — S72001S Fracture of unspecified part of neck of right femur, sequela: Secondary | ICD-10-CM | POA: Diagnosis not present

## 2021-07-28 ENCOUNTER — Ambulatory Visit (INDEPENDENT_AMBULATORY_CARE_PROVIDER_SITE_OTHER): Payer: PPO | Admitting: Orthopedic Surgery

## 2021-07-28 ENCOUNTER — Telehealth: Payer: Self-pay | Admitting: Family Medicine

## 2021-07-28 ENCOUNTER — Other Ambulatory Visit: Payer: Self-pay

## 2021-07-28 ENCOUNTER — Ambulatory Visit: Payer: PPO | Admitting: Family Medicine

## 2021-07-28 DIAGNOSIS — S72001A Fracture of unspecified part of neck of right femur, initial encounter for closed fracture: Secondary | ICD-10-CM | POA: Diagnosis not present

## 2021-07-28 NOTE — Telephone Encounter (Signed)
Called to r/s today's appointment due to Amy being out sick, [name] expressed concern stating that the pt broke her hip back in August and since then has completely gone downhill. I offered her the next available appt with Amy of 02/07 and she stated that she could not wait that long. Would like to be worked in if possible.

## 2021-07-28 NOTE — Telephone Encounter (Signed)
Tried calling pt's daughter back and could not get a hold of her, LVM asking her to give [name] a call back. If she returns our call, please offer pt appt 12/15 at 10:00 with Amy and advise her that she may still be sick tomorrow.

## 2021-07-30 DIAGNOSIS — F411 Generalized anxiety disorder: Secondary | ICD-10-CM | POA: Diagnosis not present

## 2021-08-01 ENCOUNTER — Encounter: Payer: Self-pay | Admitting: Orthopedic Surgery

## 2021-08-01 NOTE — Progress Notes (Signed)
Office Visit Note   Patient: Wendy Watts           Date of Birth: 05-14-1946           MRN: [id number] Visit Date: 07/28/2021 Requested by: [name], [place],  [place] [address] PCP: [name], MD  Subjective: Chief Complaint  Patient presents with   Right Leg - Routine Post Op    HPI: Wendy Watts is a patient who is now about 4 months out right IM nail for hip fracture.  Went to rehab on September 7.  Then home.  Then she actually had a blood clot in the left leg on September 12.  Had to have surgery to remove it.  She currently is on Eliquis.  She is walking well mostly with a walker.  She also has a pacemaker.  She is at home with her father and brother.  She has lost about 20 pounds.  She has a handicap sticker.  She is able to do the steps.              ROS: All systems reviewed are negative as they relate to the chief complaint within the history of present illness.  Patient denies  fevers or chills.   Assessment & Plan: Visit Diagnoses:  1. Closed fracture of right hip, initial encounter [facility])     Plan: Impression is right hip fracture 4 months out doing well weightbearing with not too much pain on exam.  Fracture is healed nicely.  She did have a blood clot in that left leg.  That has been dealt with.  She will follow-up with [name] as needed.  Anticipate that she may be able to transition off of the walker after another month or 2 to a cane.  Follow-Up Instructions: Return if symptoms worsen or fail to improve.   Orders:  No orders of the defined types were placed in this encounter.  No orders of the defined types were placed in this encounter.     Procedures: No procedures performed   Clinical Data: No additional findings.  Objective: Vital Signs: There were no vitals taken for this visit.  Physical Exam:   Constitutional: Patient appears well-developed HEENT:  Head: Normocephalic Eyes:EOM are normal Neck: Normal range of  motion Cardiovascular: Normal rate Pulmonary/chest: Effort normal Neurologic: Patient is alert Skin: Skin is warm Psychiatric: Patient has normal mood and affect   Ortho Exam: Ortho exam demonstrates equal leg lengths.  Ankle dorsiflexion plantarflexion intact.  Pedal pulses palpable.  Not too much groin pain with internal ex rotation on the right compared to the left.  Left calf has about 1 more centimeter of swelling compared to the right.  Hip flexion strength is 5- out of 5 on the right 5+ out of 5 on the left.  Specialty Comments:  No specialty comments available.  Imaging: No results found.   PMFS History: Patient Active Problem List   Diagnosis Date Noted   Cardiac pacemaker in situ    Malnutrition of moderate degree 04/28/2021   Acute renal failure (ARF) ([place]) 04/26/2021   SIRS (systemic inflammatory response syndrome) (HCC) 04/26/2021   Nausea & vomiting 04/26/2021   ARF (acute renal failure) ([place]) 04/26/2021   Dementia without behavioral disturbance ([place]) 05/08/2019   Past Medical History:  Diagnosis Date   Anxiety    Dementia (HCC)    High cholesterol    Vertigo     Family History  Problem Relation  Age of Onset   Dementia Neg Hx    Alzheimer's disease Neg Hx     Past Surgical History:  Procedure Laterality Date   HYSTERECTOMY     INTRAMEDULLARY (IM) NAIL INTERTROCHANTERIC Right 03/28/2021   Procedure: INTRAMEDULLARY (IM) NAIL INTERTROCHANTRIC;  Surgeon: Meredith Pel, MD;  Location: [facility];  Service: Orthopedics;  Laterality: Right;   PACEMAKER IMPLANT N/A 05/10/2021   Procedure: PACEMAKER IMPLANT;  Surgeon: Vickie Epley, MD;  Location: [place] [facility];  Service: Cardiovascular;  Laterality: N/A;   PERIPHERAL VASCULAR THROMBECTOMY N/A 05/04/2021   Procedure: PERIPHERAL VASCULAR THROMBECTOMY - VENUS;  Surgeon: Serafina Mitchell, MD;  Location: [place] [facility];  Service: Cardiovascular;  Laterality: N/A;   TOTAL KNEE ARTHROPLASTY Right     Social History   Occupational History   Not on file  Tobacco Use   Smoking status: Never   Smokeless tobacco: Never  Substance and Sexual Activity   Alcohol use: Never   Drug use: Never   Sexual activity: Not on file

## 2021-08-05 LAB — CUP PACEART REMOTE DEVICE CHECK
Date Time Interrogation Session: 20221222080243
Implantable Lead Implant Date: 20220926
Implantable Lead Location: 753860
Implantable Lead Model: 377171
Implantable Lead Serial Number: 8000535066
Implantable Pulse Generator Implant Date: 20220926
Pulse Gen Model: 407157
Pulse Gen Serial Number: 70243330

## 2021-08-10 ENCOUNTER — Ambulatory Visit (INDEPENDENT_AMBULATORY_CARE_PROVIDER_SITE_OTHER): Payer: PPO

## 2021-08-10 DIAGNOSIS — R55 Syncope and collapse: Secondary | ICD-10-CM

## 2021-08-12 ENCOUNTER — Ambulatory Visit: Payer: PPO | Admitting: Cardiology

## 2021-08-12 ENCOUNTER — Other Ambulatory Visit: Payer: Self-pay

## 2021-08-12 ENCOUNTER — Encounter: Payer: Self-pay | Admitting: Cardiology

## 2021-08-12 VITALS — BP 90/64 | HR 72 | Ht 65.0 in | Wt 122.8 lb

## 2021-08-12 DIAGNOSIS — Z950 Presence of cardiac pacemaker: Secondary | ICD-10-CM

## 2021-08-12 DIAGNOSIS — I824Y2 Acute embolism and thrombosis of unspecified deep veins of left proximal lower extremity: Secondary | ICD-10-CM

## 2021-08-12 DIAGNOSIS — R001 Bradycardia, unspecified: Secondary | ICD-10-CM | POA: Diagnosis not present

## 2021-08-12 NOTE — Progress Notes (Signed)
Electrophysiology Office Follow up Visit Note:    Date:  08/12/2021   ID:  Wendy Watts, DOB 07-25-1946, MRN [id number]  PCP:  [name], MD  [place] Cardiologist:  None  [facility] Electrophysiologist:  Vickie Epley, MD    Interval History:    Wendy Watts is a 75 y.o. female who presents for a follow up visit after pacemaker implant on May 10, 2021 for sinus pauses.  She has done well since implant.  Incision healed well.  She is here with her daughter who I previously met.       Past Medical History:  Diagnosis Date   Anxiety    Dementia ([place])    High cholesterol    Vertigo     Past Surgical History:  Procedure Laterality Date   HYSTERECTOMY     INTRAMEDULLARY (IM) NAIL INTERTROCHANTERIC Right 03/28/2021   Procedure: INTRAMEDULLARY (IM) NAIL INTERTROCHANTRIC;  Surgeon: Meredith Pel, MD;  [facility];  Service: Orthopedics;  Laterality: Right;   PACEMAKER IMPLANT N/A 05/10/2021   Procedure: PACEMAKER IMPLANT;  Surgeon: Vickie Epley, MD;  Location: [place] [facility];  Service: Cardiovascular;  Laterality: N/A;   PERIPHERAL VASCULAR THROMBECTOMY N/A 05/04/2021   Procedure: PERIPHERAL VASCULAR THROMBECTOMY - VENUS;  Surgeon: Serafina Mitchell, MD;  Location: [place] [facility];  Service: Cardiovascular;  Laterality: N/A;   TOTAL KNEE ARTHROPLASTY Right     Current Medications: Current Meds  Medication Sig   acetaminophen (TYLENOL) 500 MG tablet Take 1 tablet (500 mg total) by mouth every 8 (eight) hours as needed for moderate pain.   busPIRone (BUSPAR) 10 MG tablet Take 10 mg by mouth 2 (two) times daily.   donepezil (ARICEPT) 10 MG tablet TAKE 1 TABLET(10 MG) BY MOUTH AT BEDTIME (Patient taking differently: Take 10 mg by mouth at bedtime.)   ELIQUIS 5 MG TABS tablet TAKE 1 TABLET(5 MG) BY MOUTH TWICE DAILY   fluticasone (FLONASE) 50 MCG/ACT nasal spray Place 2 sprays into both nostrils daily as needed for allergies or  rhinitis.   memantine (NAMENDA) 10 MG tablet Take 1 tablet (10 mg total) by mouth 2 (two) times daily.   polyethylene glycol (MIRALAX / GLYCOLAX) 17 g packet Take 17 g by mouth daily as needed.   vitamin B-12 (CYANOCOBALAMIN) 1000 MCG tablet Take 1 tablet (1,000 mcg total) by mouth daily.   VITAMIN D PO Take 5,000 Units by mouth daily.   Wheat Dextrin (BENEFIBER DRINK MIX) PACK Take 1 Scoop by mouth daily. Mix in applesauce     Allergies:   Allegra [fexofenadine hcl], Ativan [lorazepam], Azithromycin, Codeine, Doxycycline hyclate, Penicillins, Trazodone and nefazodone, and Zyrtec [cetirizine]   Social History   Socioeconomic History   Marital status: Married    Spouse name: Not on file   Number of children: 2   Years of education: Not on file   Highest education level: High school graduate  Occupational History   Not on file  Tobacco Use   Smoking status: Never   Smokeless tobacco: Never  Substance and Sexual Activity   Alcohol use: Never   Drug use: Never   Sexual activity: Not on file  Other Topics Concern   Not on file  Social History Narrative   LIves at home with husband and son   Social Determinants of Health   Financial Resource Strain: Not on file  Food Insecurity: Not on file  Transportation Needs: Not on file  Physical Activity: Not  on file  Stress: Not on file  Social Connections: Not on file     Family History: The patient's family history is negative for Dementia and Alzheimer's disease.  ROS:   Please see the history of present illness.    All other systems reviewed and are negative.  EKGs/Labs/Other Studies Reviewed:    The following studies were reviewed today:  August 12, 2021 in clinic device interrogation personally reviewed Longevity 12 years 0% pacing  EKG:  The ekg ordered today demonstrates sinus rhythm  Recent Labs: 03/28/2021: TSH 0.562 04/28/2021: ALT 17 05/10/2021: BUN 9; Creatinine, Ser 0.82; Hemoglobin 10.7; Magnesium 1.8;  Platelets 284; Potassium 3.6; Sodium 138  Recent Lipid Panel No results found for: CHOL, TRIG, HDL, CHOLHDL, VLDL, LDLCALC, LDLDIRECT  Physical Exam:    VS:  BP 90/64    Pulse 72    Ht '5\' 5"'  (1.651 m)    Wt 122 lb 12.8 oz (55.7 kg)    SpO2 96%    BMI 20.43 kg/m     Wt Readings from Last 3 Encounters:  08/12/21 122 lb 12.8 oz (55.7 kg)  06/07/21 140 lb (63.5 kg)  05/03/21 142 lb 6.7 oz (64.6 kg)     GEN:  Well nourished, well developed in no acute distress HEENT: Normal NECK: No JVD; No carotid bruits LYMPHATICS: No lymphadenopathy CARDIAC: RRR, no murmurs, rubs, gallops RESPIRATORY:  Clear to auscultation without rales, wheezing or rhonchi  ABDOMEN: Soft, non-tender, non-distended MUSCULOSKELETAL:  No edema; No deformity  SKIN: Warm and dry NEUROLOGIC:  Alert and oriented x 3 PSYCHIATRIC:  Normal affect        ASSESSMENT:    1. Bradycardia    PLAN:    In order of problems listed above:  #Symptomatic sinus bradycardia post permanent pacemaker implant Pacemaker functioning appropriately on today's check.  Continue remote monitoring.  I will see her back in 9 months or sooner as needed.  #Deep vein thrombosis On Eliquis.  #Dementia Here with daughter who is very involved in her care.  Follow-up in clinic in 9 months or sooner as needed.     Medication Adjustments/Labs and Tests Ordered: Current medicines are reviewed at length with the patient today.  Concerns regarding medicines are outlined above.  Orders Placed This Encounter  Procedures   EKG 12-Lead   No orders of the defined types were placed in this encounter.    Signed, Lars Mage, MD, [facility], [facility] 08/12/2021 2:08 PM    Electrophysiology  [facility]

## 2021-08-12 NOTE — Patient Instructions (Signed)
Medication Instructions:  Your physician recommends that you continue on your current medications as directed. Please refer to the Current Medication list given to you today. *If you need a refill on your cardiac medications before your next appointment, please call your pharmacy*  Lab Work: None ordered. If you have labs (blood work) drawn today and your tests are completely normal, you will receive your results only by: [place] (if you have MyChart) OR A paper copy in the mail If you have any lab test that is abnormal or we need to change your treatment, we will call you to review the results.  Testing/Procedures: None ordered.  Follow-Up: At [facility], you and your health needs are our priority.  As part of our continuing mission to provide you with exceptional heart care, we have created designated Provider Care Teams.  These Care Teams include your primary Cardiologist (physician) and Advanced Practice Providers (APPs -  Physician Assistants and Nurse Practitioners) who all work together to provide you with the care you need, when you need it.  Your next appointment:   Your physician wants you to follow-up in: 9 months with Wendy Epley, MD. Dennis Bast will receive a reminder letter in the mail two months in advance. If you don't receive a letter, please call our office to schedule the follow-up appointment.  Remote monitoring is used to monitor your Pacemaker from home. This monitoring reduces the number of office visits required to check your device to one time per year. It allows [name] to keep an eye on the functioning of your device to ensure it is working properly. You are scheduled for a device check from home on 11/09/2021. You may send your transmission at any time that day. If you have a wireless device, the transmission will be sent automatically. After your physician reviews your transmission, you will receive a postcard with your next transmission date.

## 2021-08-17 ENCOUNTER — Other Ambulatory Visit: Payer: Self-pay | Admitting: Surgery

## 2021-08-20 NOTE — Progress Notes (Signed)
Remote pacemaker transmission.   

## 2021-08-21 DIAGNOSIS — Z9181 History of falling: Secondary | ICD-10-CM | POA: Diagnosis not present

## 2021-08-21 DIAGNOSIS — S72001S Fracture of unspecified part of neck of right femur, sequela: Secondary | ICD-10-CM | POA: Diagnosis not present

## 2021-08-21 DIAGNOSIS — S72001A Fracture of unspecified part of neck of right femur, initial encounter for closed fracture: Secondary | ICD-10-CM | POA: Diagnosis not present

## 2021-08-21 DIAGNOSIS — F0390 Unspecified dementia without behavioral disturbance: Secondary | ICD-10-CM | POA: Diagnosis not present

## 2021-09-20 ENCOUNTER — Other Ambulatory Visit: Payer: Self-pay | Admitting: Surgery

## 2021-09-21 DIAGNOSIS — S72001A Fracture of unspecified part of neck of right femur, initial encounter for closed fracture: Secondary | ICD-10-CM | POA: Diagnosis not present

## 2021-09-21 DIAGNOSIS — Z9181 History of falling: Secondary | ICD-10-CM | POA: Diagnosis not present

## 2021-09-21 DIAGNOSIS — S72001S Fracture of unspecified part of neck of right femur, sequela: Secondary | ICD-10-CM | POA: Diagnosis not present

## 2021-09-21 DIAGNOSIS — F0390 Unspecified dementia without behavioral disturbance: Secondary | ICD-10-CM | POA: Diagnosis not present

## 2021-10-20 ENCOUNTER — Other Ambulatory Visit: Payer: Self-pay | Admitting: Vascular Surgery

## 2021-11-09 ENCOUNTER — Ambulatory Visit (INDEPENDENT_AMBULATORY_CARE_PROVIDER_SITE_OTHER): Payer: PPO

## 2021-11-09 DIAGNOSIS — Z950 Presence of cardiac pacemaker: Secondary | ICD-10-CM | POA: Diagnosis not present

## 2021-11-09 LAB — CUP PACEART REMOTE DEVICE CHECK
Date Time Interrogation Session: 20230328090936
Implantable Lead Implant Date: 20220926
Implantable Lead Location: 753860
Implantable Lead Model: 377171
Implantable Lead Serial Number: 8000535066
Implantable Pulse Generator Implant Date: 20220926
Pulse Gen Model: 407157
Pulse Gen Serial Number: 70243330

## 2021-11-22 ENCOUNTER — Other Ambulatory Visit: Payer: Self-pay | Admitting: Family Medicine

## 2021-11-22 DIAGNOSIS — Z1231 Encounter for screening mammogram for malignant neoplasm of breast: Secondary | ICD-10-CM

## 2021-11-22 DIAGNOSIS — M8580 Other specified disorders of bone density and structure, unspecified site: Secondary | ICD-10-CM

## 2021-11-22 NOTE — Progress Notes (Signed)
Remote pacemaker transmission.   

## 2021-11-25 ENCOUNTER — Other Ambulatory Visit: Payer: Self-pay | Admitting: Vascular Surgery

## 2021-12-01 ENCOUNTER — Encounter: Payer: Self-pay | Admitting: Family Medicine

## 2021-12-01 ENCOUNTER — Ambulatory Visit: Payer: PPO | Admitting: Family Medicine

## 2021-12-01 VITALS — BP 119/66 | HR 66 | Ht 65.0 in | Wt 138.5 lb

## 2021-12-01 DIAGNOSIS — G309 Alzheimer's disease, unspecified: Secondary | ICD-10-CM

## 2021-12-01 NOTE — Progress Notes (Signed)
? ? ?Chief Complaint  ?Patient presents with  ? Follow-up  ?  Rm 1, alone. Pt here to f/u for AD. Last seen 07/28/20 and scored 15 on MMSE. Pt fell and broke her R hip Aug 14,2022. Had a blood cot 04/2021 and was hospitalized for 2/5 weeks and had a pacemaker placed around that time. Memory has progressively gotten worse. She's is unable to recognize her reflection in the mirror. MMSE:15  ? ? ?HISTORY OF PRESENT ILLNESS: ? ?12/01/21 ALL: ?Wendy Watts returns for follow up for AD. She continues memantine '10mg'$  BID and donepezil '10mg'$  daily. She presents with her daughter, today, who aids in history.  ?She had a fall last summer and broke her hip. She had progressive decline in memory following. She lost about 20 pounds but has started to gain back. She loves sweets. She had a large DVT following hip fracture. She had recently had Covid booster. During hospitalization she had some bradycardia with prolonged pauses. She is s/p pacemaker placement. She is back home. She is walking much better. Able to walk without assistive device. No falls. She is taking vitamin D supplements. She is scheduled for bone density test in September. She is having more difficulty with irritability. She does not like to take medications. She does not like to shower. She was started on escitalopram which seems to have helped some. She is wearing depends. She does recognize the need to urinate at times. ? ? ?07/28/2020 ALL:  ?Wendy Watts is a 76 y.o. female here today for follow up for AD. We increased memantine to '10mg'$  BID and continued Aricept at last visit.  She has tolerated medications well.  No obvious adverse effects.  She feels that memory is fairly stable.  Her husband is with her today and agrees.  No significant changes.  No behavioral changes.  She continues to perform ADLs with minimal assistance.  She does not drive. ? ? ?HISTORY (copied from previous note) ? ?Wendy Watts is a 76 y.o. female here today for follow up. She has  continued Aricept '10mg'$  daily. Namenda '5mg'$  BID was added in 10/2019. She has tolerated it well. Memory seems stable. Her husband is with her today and aids in history. She continues to perform ADL's independently. She may need some assistance with getting in and out of the shower. Her daughter lays out her clothes. She does not drive. She reports being active. She likes to dance. No falls. Her husband reports that they are active and get out of the house every day.  ?  ?  ?HISTORY: (copied from my note on 11/06/2019) ?  ?Wendy Watts is a 76 y.o. female here today for follow up for dementia. She has tolerated Aricept '10mg'$  daily. Mrs Salah feels that it has helped. Her daughter is with her today and is not certain. She requires assistance with ADL's.  Her daughter will lay out her close but she is able to put garments on herself.  Her husband assist her with getting in and out of the bathtub.  [name] is able to wash herself.  Her husband helps with medications. Husband and daughter help with finances. She does not drive. She did have a fall in October.. She stumbled up set of stairs. She did hit her head but reports only bruising. She has been followed closely by PCP. She was recently started on Buspar. She does feel that it has helped some.  Her son is currently awaiting trial for theft.  Her  daughter mentions today that patient may have history of bipolar disorder.  There is been a strained relationship between mother and daughter for years. ? ? ?REVIEW OF SYSTEMS: Out of a complete 14 system review of symptoms, the patient complains only of the following symptoms, memory loss and all other reviewed systems are negative. ? ? ?ALLERGIES: ?Allergies  ?Allergen Reactions  ? Allegra [Fexofenadine Hcl] Other (See Comments)  ?  "takes me to another world"  ? Ativan [Lorazepam] Other (See Comments)  ?  Hallucinations  ? Azithromycin Nausea And Vomiting  ? Codeine Hives  ? Doxycycline Hyclate Nausea Only  ? Penicillins Hives  ?   Has patient had a PCN reaction causing immediate rash, facial/tongue/throat swelling, SOB or lightheadedness with hypotension: no ?Has patient had a PCN reaction causing severe rash involving mucus membranes or skin necrosis: no ?Has patient had a PCN reaction that required hospitalization: no ?Has patient had a PCN reaction occurring within the last 10 years: no ?If all of the above answers are "NO", then may proceed with Cephalosporin use. ?  ? Trazodone And Nefazodone Nausea Only  ? Zyrtec [Cetirizine] Other (See Comments)  ?  "takes me to another world"  ? ? ? ?HOME MEDICATIONS: ?Outpatient Medications Prior to Visit  ?Medication Sig Dispense Refill  ? busPIRone (BUSPAR) 10 MG tablet Take 10 mg by mouth 2 (two) times daily.    ? donepezil (ARICEPT) 10 MG tablet TAKE 1 TABLET(10 MG) BY MOUTH AT BEDTIME (Patient taking differently: Take 10 mg by mouth at bedtime.) 90 tablet 3  ? ELIQUIS 5 MG TABS tablet TAKE 1 TABLET(5 MG) BY MOUTH TWICE DAILY 60 tablet 0  ? escitalopram (LEXAPRO) 5 MG tablet Take 5 mg by mouth daily.    ? memantine (NAMENDA) 10 MG tablet Take 1 tablet (10 mg total) by mouth 2 (two) times daily. 180 tablet 3  ? vitamin B-12 (CYANOCOBALAMIN) 1000 MCG tablet Take 1 tablet (1,000 mcg total) by mouth daily. 30 tablet 0  ? VITAMIN D PO Take 5,000 Units by mouth daily.    ? Wheat Dextrin (BENEFIBER DRINK MIX) PACK Take 1 Scoop by mouth daily. Mix in applesauce    ? acetaminophen (TYLENOL) 500 MG tablet Take 1 tablet (500 mg total) by mouth every 8 (eight) hours as needed for moderate pain. 20 tablet 0  ? fluticasone (FLONASE) 50 MCG/ACT nasal spray Place 2 sprays into both nostrils daily as needed for allergies or rhinitis.    ? polyethylene glycol (MIRALAX / GLYCOLAX) 17 g packet Take 17 g by mouth daily as needed. 14 each 0  ? ?No facility-administered medications prior to visit.  ? ? ? ?PAST MEDICAL HISTORY: ?Past Medical History:  ?Diagnosis Date  ? Anxiety   ? Dementia ([place])   ? High cholesterol    ? Vertigo   ? ? ? ?PAST SURGICAL HISTORY: ?Past Surgical History:  ?Procedure Laterality Date  ? HYSTERECTOMY    ? INTRAMEDULLARY (IM) NAIL INTERTROCHANTERIC Right 03/28/2021  ? Procedure: INTRAMEDULLARY (IM) NAIL INTERTROCHANTRIC;  Surgeon: Meredith Pel, MD;  [facility]: [facility];  Service: Orthopedics;  Laterality: Right;  ? PACEMAKER IMPLANT N/A 05/10/2021  ? Procedure: PACEMAKER IMPLANT;  Surgeon: Vickie Epley, MD;  Location: [place] [facility];  Service: Cardiovascular;  Laterality: N/A;  ? PERIPHERAL VASCULAR THROMBECTOMY N/A 05/04/2021  ? Procedure: PERIPHERAL VASCULAR THROMBECTOMY - VENUS;  Surgeon: Serafina Mitchell, MD;  Location: [place] [facility];  Service: Cardiovascular;  Laterality: N/A;  ?  TOTAL KNEE ARTHROPLASTY Right   ? ? ? ?FAMILY HISTORY: ?Family History  ?Problem Relation Age of Onset  ? Dementia Neg Hx   ? Alzheimer's disease Neg Hx   ? ? ? ?SOCIAL HISTORY: ?Social History  ? ?Socioeconomic History  ? Marital status: Married  ?  Spouse name: Not on file  ? Number of children: 2  ? Years of education: Not on file  ? Highest education level: High school graduate  ?Occupational History  ? Not on file  ?Tobacco Use  ? Smoking status: Never  ? Smokeless tobacco: Never  ?Substance and Sexual Activity  ? Alcohol use: Never  ? Drug use: Never  ? Sexual activity: Not on file  ?Other Topics Concern  ? Not on file  ?Social History Narrative  ? LIves at home with husband and son  ? ?Social Determinants of Health  ? ?Financial Resource Strain: Not on file  ?Food Insecurity: Not on file  ?Transportation Needs: Not on file  ?Physical Activity: Not on file  ?Stress: Not on file  ?Social Connections: Not on file  ?Intimate Partner Violence: Not on file  ? ? ? ? ?PHYSICAL EXAM ? ?Vitals:  ? 12/01/21 1525  ?BP: 119/66  ?Pulse: 66  ?Weight: 138 lb 8 oz (62.8 kg)  ?Height: '5\' 5"'$  (1.651 m)  ? ? ?Body mass index is 23.05 kg/m?. ? ? ?Generalized: Well developed, in no acute distress ? ?Cardiology: normal rate  and rhythm, no murmur auscultated  ?Respiratory: clear to auscultation bilaterally   ? ?Neurological examination  ?Mentation: Alert, she is not oriented to time, but able to tell me place, aid in little hist

## 2021-12-01 NOTE — Patient Instructions (Signed)
Below is our plan: ? ?We will continue donepezil '10mg'$  daily and memantine '10mg'$  twice daily.  ? ?Please make sure you are staying well hydrated. I recommend 50-60 ounces daily. Well balanced diet and regular exercise encouraged. Consistent sleep schedule with 6-8 hours recommended.  ? ?Please continue follow up with care team as directed.  ? ?Follow up with me in 6 months  ? ?You may receive a survey regarding today's visit. I encourage you to leave honest feed back as I do use this information to improve patient care. Thank you for seeing me today!  ? ?Management of Memory Problems ?  ?There are some general things you can do to help manage your memory problems.  Your memory may not in fact recover, but by using techniques and strategies you will be able to manage your memory difficulties better. ?  ?1)  Establish a routine. ?Try to establish and then stick to a regular routine.  By doing this, you will get used to what to expect and you will reduce the need to rely on your memory.  Also, try to do things at the same time of day, such as taking your medication or checking your calendar first thing in the morning. ?Think about think that you can do as a part of a regular routine and make a list.  Then enter them into a daily planner to remind you.  This will help you establish a routine. ?  ?2)  Organize your environment. ?Organize your environment so that it is uncluttered.  Decrease visual stimulation.  Place everyday items such as keys or cell phone in the same place every day (ie.  Basket next to front door) ?Use post it notes with a brief message to yourself (ie. Turn off light, lock the door) ?Use labels to indicate where things go (ie. Which cupboards are for food, dishes, etc.) ?Keep a notepad and pen by the telephone to take messages ?  ?3)  Memory Aids ?A diary or journal/notebook/daily planner ?Making a list (shopping list, chore list, to do list that needs to be done) ?Using an alarm as a reminder  (kitchen timer or cell phone alarm) ?Using cell phone to store information (Notes, Calendar, Reminders) ?Calendar/White board placed in a prominent position ?Post-it notes ?  ?In order for memory aids to be useful, you need to have good habits.  It's no good remembering to make a note in your journal if you don't remember to look in it.  Try setting aside a certain time of day to look in journal. ?  ?4)  Improving mood and managing fatigue. ?There may be other factors that contribute to memory difficulties.  Factors, such as anxiety, depression and tiredness can affect memory. ?Regular gentle exercise can help improve your mood and give you more energy. ?Simple relaxation techniques may help relieve symptoms of anxiety ?Try to get back to completing activities or hobbies you enjoyed doing in the past. ?Learn to pace yourself through activities to decrease fatigue. ?Find out about some local support groups where you can share experiences with others. ?Try and achieve 7-8 hours of sleep at night. ? ? ?

## 2021-12-27 ENCOUNTER — Inpatient Hospital Stay: Admission: RE | Admit: 2021-12-27 | Payer: PPO | Source: Ambulatory Visit

## 2021-12-27 ENCOUNTER — Ambulatory Visit
Admission: RE | Admit: 2021-12-27 | Discharge: 2021-12-27 | Disposition: A | Discharge status: Home / Self Care | Payer: PPO | Source: Ambulatory Visit | Attending: Family Medicine | Admitting: Family Medicine

## 2021-12-27 DIAGNOSIS — Z1231 Encounter for screening mammogram for malignant neoplasm of breast: Secondary | ICD-10-CM

## 2021-12-28 ENCOUNTER — Other Ambulatory Visit: Payer: Self-pay | Admitting: Vascular Surgery

## 2021-12-29 ENCOUNTER — Telehealth: Payer: Self-pay | Admitting: Orthopedic Surgery

## 2021-12-29 NOTE — Telephone Encounter (Signed)
IC advised could pick up at front desk.  

## 2021-12-29 NOTE — Telephone Encounter (Signed)
Wendy Watts called in reference to getting her mother a permanent handicap sticker. Her current handicap sticker is set to expire at the end of this month. Please advise ?

## 2022-01-27 ENCOUNTER — Other Ambulatory Visit: Payer: Self-pay | Admitting: Vascular Surgery

## 2022-02-01 ENCOUNTER — Other Ambulatory Visit: Payer: Self-pay | Admitting: Vascular Surgery

## 2022-02-08 ENCOUNTER — Ambulatory Visit (INDEPENDENT_AMBULATORY_CARE_PROVIDER_SITE_OTHER): Payer: PPO

## 2022-02-08 DIAGNOSIS — Z950 Presence of cardiac pacemaker: Secondary | ICD-10-CM | POA: Diagnosis not present

## 2022-02-09 LAB — CUP PACEART REMOTE DEVICE CHECK
Date Time Interrogation Session: 20230627085835
Implantable Lead Implant Date: 20220926
Implantable Lead Location: 753860
Implantable Lead Model: 377171
Implantable Lead Serial Number: 8000535066
Implantable Pulse Generator Implant Date: 20220926
Pulse Gen Model: 407157
Pulse Gen Serial Number: 70243330

## 2022-03-02 NOTE — Progress Notes (Signed)
Remote pacemaker transmission.   

## 2022-03-16 ENCOUNTER — Other Ambulatory Visit: Payer: Self-pay | Admitting: Vascular Surgery

## 2022-03-28 ENCOUNTER — Other Ambulatory Visit: Payer: Self-pay

## 2022-03-28 MED ORDER — DONEPEZIL HCL 10 MG PO TABS: ORAL_TABLET | ORAL | 3 refills | Status: DC

## 2022-03-29 ENCOUNTER — Other Ambulatory Visit: Payer: Self-pay

## 2022-03-29 MED ORDER — MEMANTINE HCL 10 MG PO TABS: 10.0000 mg | ORAL_TABLET | Freq: Two times a day (BID) | ORAL | 0 refills | Status: DC

## 2022-04-12 DIAGNOSIS — F0390 Unspecified dementia without behavioral disturbance: Secondary | ICD-10-CM | POA: Diagnosis not present

## 2022-04-12 DIAGNOSIS — G309 Alzheimer's disease, unspecified: Secondary | ICD-10-CM | POA: Diagnosis not present

## 2022-04-12 DIAGNOSIS — G43909 Migraine, unspecified, not intractable, without status migrainosus: Secondary | ICD-10-CM | POA: Diagnosis not present

## 2022-04-12 DIAGNOSIS — E785 Hyperlipidemia, unspecified: Secondary | ICD-10-CM | POA: Diagnosis not present

## 2022-04-27 DIAGNOSIS — R635 Abnormal weight gain: Secondary | ICD-10-CM | POA: Diagnosis not present

## 2022-04-27 DIAGNOSIS — G4700 Insomnia, unspecified: Secondary | ICD-10-CM | POA: Diagnosis not present

## 2022-04-27 DIAGNOSIS — R451 Restlessness and agitation: Secondary | ICD-10-CM | POA: Diagnosis not present

## 2022-05-10 ENCOUNTER — Ambulatory Visit (INDEPENDENT_AMBULATORY_CARE_PROVIDER_SITE_OTHER): Payer: PPO

## 2022-05-10 DIAGNOSIS — R55 Syncope and collapse: Secondary | ICD-10-CM | POA: Diagnosis not present

## 2022-05-10 LAB — CUP PACEART REMOTE DEVICE CHECK
Date Time Interrogation Session: 20230926084533
Implantable Lead Implant Date: 20220926
Implantable Lead Location: 753860
Implantable Lead Model: 377171
Implantable Lead Serial Number: 8000535066
Implantable Pulse Generator Implant Date: 20220926
Pulse Gen Model: 407157
Pulse Gen Serial Number: 70243330

## 2022-05-11 DIAGNOSIS — R451 Restlessness and agitation: Secondary | ICD-10-CM | POA: Diagnosis not present

## 2022-05-11 DIAGNOSIS — G4700 Insomnia, unspecified: Secondary | ICD-10-CM | POA: Diagnosis not present

## 2022-05-11 DIAGNOSIS — R399 Unspecified symptoms and signs involving the genitourinary system: Secondary | ICD-10-CM | POA: Diagnosis not present

## 2022-05-13 ENCOUNTER — Ambulatory Visit
Admission: RE | Admit: 2022-05-13 | Discharge: 2022-05-13 | Disposition: A | Discharge status: Home / Self Care | Payer: PPO | Source: Ambulatory Visit | Attending: Family Medicine | Admitting: Family Medicine

## 2022-05-13 DIAGNOSIS — M810 Age-related osteoporosis without current pathological fracture: Secondary | ICD-10-CM | POA: Diagnosis not present

## 2022-05-13 DIAGNOSIS — M8580 Other specified disorders of bone density and structure, unspecified site: Secondary | ICD-10-CM

## 2022-05-13 DIAGNOSIS — Z780 Asymptomatic menopausal state: Secondary | ICD-10-CM | POA: Diagnosis not present

## 2022-05-20 NOTE — Progress Notes (Signed)
Remote pacemaker transmission.   

## 2022-05-25 ENCOUNTER — Other Ambulatory Visit: Payer: Self-pay | Admitting: Vascular Surgery

## 2022-06-01 NOTE — Progress Notes (Unsigned)
No chief complaint on file.   HISTORY OF PRESENT ILLNESS:  06/01/22 ALL: Wendy Watts returns for follow up for AD. She continues memantine and donepezil.   12/01/2021 ALL: Wendy Watts returns for follow up for AD. She continues memantine '10mg'$  BID and donepezil '10mg'$  daily. She presents with her daughter, today, who aids in history.  She had a fall last summer and broke her hip. She had progressive decline in memory following. She lost about 20 pounds but has started to gain back. She loves sweets. She had a large DVT following hip fracture. She had recently had Covid booster. During hospitalization she had some bradycardia with prolonged pauses. She is s/p pacemaker placement. She is back home. She is walking much better. Able to walk without assistive device. No falls. She is taking vitamin D supplements. She is scheduled for bone density test in September. She is having more difficulty with irritability. She does not like to take medications. She does not like to shower. She was started on escitalopram which seems to have helped some. She is wearing depends. She does recognize the need to urinate at times.  07/28/2020 ALL:  Wendy Watts is a 76 y.o. female here today for follow up for AD. We increased memantine to '10mg'$  BID and continued Aricept at last visit.  She has tolerated medications well.  No obvious adverse effects.  She feels that memory is fairly stable.  Her husband is with her today and agrees.  No significant changes.  No behavioral changes.  She continues to perform ADLs with minimal assistance.  She does not drive.  HISTORY (copied from previous note)  Wendy Watts is a 76 y.o. female here today for follow up. She has continued Aricept '10mg'$  daily. Namenda '5mg'$  BID was added in 10/2019. She has tolerated it well. Memory seems stable. Her husband is with her today and aids in history. She continues to perform ADL's independently. She may need some assistance with getting in and out of the  shower. Her daughter lays out her clothes. She does not drive. She reports being active. She likes to dance. No falls. Her husband reports that they are active and get out of the house every day.    HISTORY: (copied from my note on 11/06/2019)   Wendy Watts is a 76 y.o. female here today for follow up for dementia. She has tolerated Aricept '10mg'$  daily. Mrs Wandrey feels that it has helped. Her daughter is with her today and is not certain. She requires assistance with ADL's.  Her daughter will lay out her close but she is able to put garments on herself.  Her husband assist her with getting in and out of the bathtub.  [name] is able to wash herself.  Her husband helps with medications. Husband and daughter help with finances. She does not drive. She did have a fall in October.. She stumbled up set of stairs. She did hit her head but reports only bruising. She has been followed closely by PCP. She was recently started on Buspar. She does feel that it has helped some.  Her son is currently awaiting trial for theft.  Her daughter mentions today that patient may have history of bipolar disorder.  There is been a strained relationship between mother and daughter for years.   REVIEW OF SYSTEMS: Out of a complete 14 system review of symptoms, the patient complains only of the following symptoms, memory loss and all other reviewed systems are negative.  ALLERGIES: Allergies  Allergen Reactions   Allegra [Fexofenadine Hcl] Other (See Comments)    "takes me to another world"   Ativan [Lorazepam] Other (See Comments)    Hallucinations   Azithromycin Nausea And Vomiting   Codeine Hives   Doxycycline Hyclate Nausea Only   Penicillins Hives    Has patient had a PCN reaction causing immediate rash, facial/tongue/throat swelling, SOB or lightheadedness with hypotension: no Has patient had a PCN reaction causing severe rash involving mucus membranes or skin necrosis: no Has patient had a PCN reaction that  required hospitalization: no Has patient had a PCN reaction occurring within the last 10 years: no If all of the above answers are "NO", then may proceed with Cephalosporin use.    Trazodone And Nefazodone Nausea Only   Zyrtec [Cetirizine] Other (See Comments)    "takes me to another world"     HOME MEDICATIONS: Outpatient Medications Prior to Visit  Medication Sig Dispense Refill   busPIRone (BUSPAR) 10 MG tablet Take 10 mg by mouth 2 (two) times daily.     donepezil (ARICEPT) 10 MG tablet TAKE 1 TABLET(10 MG) BY MOUTH AT BEDTIME 90 tablet 3   ELIQUIS 5 MG TABS tablet TAKE 1 TABLET(5 MG) BY MOUTH TWICE DAILY 60 tablet 0   escitalopram (LEXAPRO) 5 MG tablet Take 5 mg by mouth daily.     memantine (NAMENDA) 10 MG tablet Take 1 tablet (10 mg total) by mouth 2 (two) times daily. 180 tablet 0   vitamin B-12 (CYANOCOBALAMIN) 1000 MCG tablet Take 1 tablet (1,000 mcg total) by mouth daily. 30 tablet 0   VITAMIN D PO Take 5,000 Units by mouth daily.     Wheat Dextrin (BENEFIBER DRINK MIX) PACK Take 1 Scoop by mouth daily. Mix in applesauce     No facility-administered medications prior to visit.     PAST MEDICAL HISTORY: Past Medical History:  Diagnosis Date   Anxiety    Dementia ([place])    High cholesterol    Vertigo      PAST SURGICAL HISTORY: Past Surgical History:  Procedure Laterality Date   HYSTERECTOMY     INTRAMEDULLARY (IM) NAIL INTERTROCHANTERIC Right 03/28/2021   Procedure: INTRAMEDULLARY (IM) NAIL INTERTROCHANTRIC;  Surgeon: Meredith Pel, MD;  Location: [facility];  Service: Orthopedics;  Laterality: Right;   PACEMAKER IMPLANT N/A 05/10/2021   Procedure: PACEMAKER IMPLANT;  Surgeon: Vickie Epley, MD;  Location: [facility];  Service: Cardiovascular;  Laterality: N/A;   PERIPHERAL VASCULAR THROMBECTOMY N/A 05/04/2021   Procedure: PERIPHERAL VASCULAR THROMBECTOMY - VENUS;  Surgeon: Serafina Mitchell, MD;  Location: [facility];  Service: Cardiovascular;   Laterality: N/A;   TOTAL KNEE ARTHROPLASTY Right      FAMILY HISTORY: Family History  Problem Relation Age of Onset   Dementia Neg Hx    Alzheimer's disease Neg Hx      SOCIAL HISTORY: Social History   Socioeconomic History   Marital status: Married    Spouse name: Not on file   Number of children: 2   Years of education: Not on file   Highest education level: High school graduate  Occupational History   Not on file  Tobacco Use   Smoking status: Never   Smokeless tobacco: Never  Substance and Sexual Activity   Alcohol use: Never   Drug use: Never   Sexual activity: Not on file  Other Topics Concern   Not on file  Social History Narrative   [place]  at home with husband and son   Social Determinants of Radio broadcast assistant Strain: Not on file  Food Insecurity: Not on file  Transportation Needs: Not on file  Physical Activity: Not on file  Stress: Not on file  Social Connections: Not on file  Intimate Partner Violence: Not on file      PHYSICAL EXAM  There were no vitals filed for this visit.   There is no height or weight on file to calculate BMI.   Generalized: Well developed, in no acute distress  Cardiology: normal rate and rhythm, no murmur auscultated  Respiratory: clear to auscultation bilaterally    Neurological examination  Mentation: Alert, she is not oriented to time, but able to tell me place, aid in little history taking. Unable to carry on meaningful conversation. Follows all commands speech and language fluent Cranial nerve II-XII: Pupils were equal round reactive to light. Extraocular movements were full, visual field were full on confrontational test. Facial sensation and strength were normal. Head turning and shoulder shrug  were normal and symmetric. Motor: The motor testing reveals 5 over 5 strength of all 4 extremities. Good symmetric motor tone is noted throughout.  Coordination: Cerebellar testing reveals good  finger-nose-finger and heel-to-shin bilaterally.  Gait and station: Pushes to standing position without difficulty. Gait is mildly arthritic, stable without assistive device.     DIAGNOSTIC DATA (LABS, IMAGING, TESTING) - I reviewed patient records, labs, notes, testing and imaging myself where available.  Lab Results  Component Value Date   WBC 6.0 05/10/2021   HGB 10.7 (L) 05/10/2021   HCT 33.2 (L) 05/10/2021   MCV 94.3 05/10/2021   PLT 284 05/10/2021      Component Value Date/Time   NA 138 05/10/2021 0124   NA 142 05/08/2019 1156   K 3.6 05/10/2021 0124   CL 106 05/10/2021 0124   CO2 26 05/10/2021 0124   GLUCOSE 91 05/10/2021 0124   BUN 9 05/10/2021 0124   BUN 11 05/08/2019 1156   CREATININE 0.82 05/10/2021 0124   CALCIUM 9.5 05/10/2021 0124   PROT 5.5 (L) 04/28/2021 0035   PROT 6.8 05/08/2019 1156   ALBUMIN 2.5 (L) 04/28/2021 0035   ALBUMIN 4.1 05/08/2019 1156   AST 16 04/28/2021 0035   ALT 17 04/28/2021 0035   ALKPHOS 109 04/28/2021 0035   BILITOT 1.1 04/28/2021 0035   BILITOT 0.6 05/08/2019 1156   GFRNONAA >60 05/10/2021 0124   GFRAA 72 05/08/2019 1156   No results found for: "CHOL", "HDL", "LDLCALC", "LDLDIRECT", "TRIG", "CHOLHDL" Lab Results  Component Value Date   HGBA1C 5.4 03/29/2021   Lab Results  Component Value Date   VITAMINB12 186 05/02/2021   Lab Results  Component Value Date   TSH 0.562 03/28/2021       12/01/2021    3:38 PM 07/28/2020   10:48 AM 02/06/2020   10:02 AM  MMSE - Mini Mental State Exam  Orientation to time 0 0 0  Orientation to Place '5 4 3  '$ Registration '3 3 3  '$ Attention/ Calculation 0 0 0  Recall '1 1 2  '$ Language- name 2 objects '2 2 2  '$ Language- repeat 0 1 0  Language- follow 3 step command '2 2 3  '$ Language- read & follow direction '1 1 1  '$ Write a sentence '1 1 1  '$ Copy design 0 0 1  Total score '15 15 16     '$ ASSESSMENT AND PLAN  76 y.o. year old female  has a  past medical history of Anxiety, Dementia ([place]), High  cholesterol, and Vertigo. here with   No diagnosis found.  Danaysha is doing well, today.  Memory seems to be fairly stable considering all that she has been through over the past year. MMSE 15 of 30, last 16 of 30. She has tolerated Aricept and Namenda.  We will continue current treatment plan. Mood seems better on escitalopram started with PCP. She will continue close follow up with Dr Harrington Challenger. She was encouraged to continue regular physical and mental activity. Fall precautions advised. Memory compensation strategies reviewed.  She will follow-up with me in 6 months.  She and her husband both verbalized understanding and agreement with this plan.  No orders of the defined types were placed in this encounter.   Debbora Presto, MSN, FNP-C 06/01/2022, 8:23 AM  [facility] [address] [place], [place] [address] [phone]

## 2022-06-01 NOTE — Patient Instructions (Incomplete)
Below is our plan:  We will continue memantine and donepezil. I will increase buspirone 10mg  three times daily. Give her 10mg  twice daily, everyday. Biagio Borg can give her an extra 10mg  during the day for periods where she is more restless. Try melatonin extended release to see if this helps with keeping her asleep. You can get this over the counter. Monitor for worsening grogginess in the mornings.   Please make sure you are staying well hydrated. I recommend 50-60 ounces daily. Well balanced diet and regular exercise encouraged. Consistent sleep schedule with 6-8 hours recommended.   Please continue follow up with care team as directed.   Follow up with me in 6 months   You may receive a survey regarding today's visit. I encourage you to leave honest feed back as I do use this information to improve patient care. Thank you for seeing me today!   Management of Memory Problems   There are some general things you can do to help manage your memory problems.  Your memory may not in fact recover, but by using techniques and strategies you will be able to manage your memory difficulties better.   1)  Establish a routine. Try to establish and then stick to a regular routine.  By doing this, you will get used to what to expect and you will reduce the need to rely on your memory.  Also, try to do things at the same time of day, such as taking your medication or checking your calendar first thing in the morning. Think about think that you can do as a part of a regular routine and make a list.  Then enter them into a daily planner to remind you.  This will help you establish a routine.   2)  Organize your environment. Organize your environment so that it is uncluttered.  Decrease visual stimulation.  Place everyday items such as keys or cell phone in the same place every day (ie.  Basket next to front door) Use post it notes with a brief message to yourself (ie. Turn off light, lock the door) Use labels to  indicate where things go (ie. Which cupboards are for food, dishes, etc.) Keep a notepad and pen by the telephone to take messages   3)  Memory Aids A diary or journal/notebook/daily planner Making a list (shopping list, chore list, to do list that needs to be done) Using an alarm as a reminder (kitchen timer or cell phone alarm) Using cell phone to store information (Notes, Calendar, Reminders) Calendar/White board placed in a prominent position Post-it notes   In order for memory aids to be useful, you need to have good habits.  It's no good remembering to make a note in your journal if you don't remember to look in it.  Try setting aside a certain time of day to look in journal.   4)  Improving mood and managing fatigue. There may be other factors that contribute to memory difficulties.  Factors, such as anxiety, depression and tiredness can affect memory. Regular gentle exercise can help improve your mood and give you more energy. Exercise: there are short videos created by the [employer] on Health specially for older adults: https://bit.ly/2I30q97.  Mediterranean diet: which emphasizes fruits, vegetables, whole grains, legumes, fish, and other seafood; unsaturated fats such as olive oils; and low amounts of red meat, eggs, and sweets. A variation of this, called MIND [facility] Intervention for Neurodegenerative Delay) incorporates the DASH (Dietary Approaches to Stop Hypertension)  diet, which has been shown to lower high blood pressure, a risk factor for Alzheimer's disease. More information at: ExitMarketing.de.  Aerobic exercise that improve heart health is also good for the mind.  [employer] on Aging have short videos for exercises that you can do at home: BlindWorkshop.com.pt Simple relaxation techniques may help relieve symptoms of anxiety Try to get back to completing activities or  hobbies you enjoyed doing in the past. Learn to pace yourself through activities to decrease fatigue. Find out about some local support groups where you can share experiences with others. Try and achieve 7-8 hours of sleep at night.   Resources for Family/Caregiver  Online caregiver support groups can be found at WesternTunes.it or call Alzheimer's Association's 24/7 hotline: (832)825-9162. [facility] offers in-person, virtual support groups and individual counseling for both care partners and persons with memory loss. Call for more information at ([phone].   Advanced care plan: there are two types of Power of Attorney: healthcare and durable. Healthcare POA is a designated person to make healthcare decisions on your behalf if you were too sick to make them yourself. This person can be selected and documented by your physician. Durable POA has to be set up with a lawyer who takes charge of your finances and estate if you were too sick or cognitively impaired to manage your finances accurately. You can find a local Elder Therapist, art here: [place].at.  Check out www.planyourlifespan.org, which will help you plan before a crisis and decide who will take care of life considerations in a circumstance where you may not be able to speak for yourself.   Helpful books (available on [employer] or your local bookstore):  By Dr. Carl Best: Keeping Love Alive as Memories Fade: The 5 Love Languages and the Alzheimer's Journey May 16, 2015 The Dementia Care Partner's Workbook: A Guide for Understanding, Education, and [employer] - January 13, 2018.  Both available for less than $15.   "Coping with behavior change in dementia: a family caregiver's guide" by Ricardo Jericho & Valora Piccolo "A Caregiver's Guide to Dementia: Using Activities and Other Strategies to Prevent, Reduce and Manage Behavioral Symptoms" by Mahala Menghini Gitlin and [employer].  Youth worker of Joy for the Person  with Alzheimer's or Dementia" 4th edition by Tama High  Caregiver videos on common behaviors related to dementia: PopulationGame.pl  [place] Caregiver Portal: free to sign up, links to local resources: [email or web address]-[email or web address]/login

## 2022-06-02 ENCOUNTER — Ambulatory Visit: Payer: PPO | Admitting: Family Medicine

## 2022-06-02 ENCOUNTER — Encounter: Payer: Self-pay | Admitting: Family Medicine

## 2022-06-02 VITALS — BP 96/71 | Ht 64.0 in | Wt 152.0 lb

## 2022-06-02 DIAGNOSIS — G309 Alzheimer's disease, unspecified: Secondary | ICD-10-CM

## 2022-06-02 MED ORDER — BUSPIRONE HCL 10 MG PO TABS: 10.0000 mg | ORAL_TABLET | Freq: Three times a day (TID) | ORAL | 3 refills | Status: DC

## 2022-06-21 ENCOUNTER — Other Ambulatory Visit: Payer: Self-pay | Admitting: Vascular Surgery

## 2022-07-06 DIAGNOSIS — R451 Restlessness and agitation: Secondary | ICD-10-CM | POA: Diagnosis not present

## 2022-07-06 DIAGNOSIS — N390 Urinary tract infection, site not specified: Secondary | ICD-10-CM | POA: Diagnosis not present

## 2022-07-06 DIAGNOSIS — F411 Generalized anxiety disorder: Secondary | ICD-10-CM | POA: Diagnosis not present

## 2022-07-06 DIAGNOSIS — G309 Alzheimer's disease, unspecified: Secondary | ICD-10-CM | POA: Diagnosis not present

## 2022-07-21 ENCOUNTER — Other Ambulatory Visit: Payer: Self-pay | Admitting: Vascular Surgery

## 2022-07-27 DIAGNOSIS — Z6826 Body mass index (BMI) 26.0-26.9, adult: Secondary | ICD-10-CM | POA: Diagnosis not present

## 2022-07-27 DIAGNOSIS — Z0000 Encounter for general adult medical examination without abnormal findings: Secondary | ICD-10-CM | POA: Diagnosis not present

## 2022-08-02 DIAGNOSIS — Z0000 Encounter for general adult medical examination without abnormal findings: Secondary | ICD-10-CM | POA: Diagnosis not present

## 2022-08-02 DIAGNOSIS — Z6828 Body mass index (BMI) 28.0-28.9, adult: Secondary | ICD-10-CM | POA: Diagnosis not present

## 2022-08-02 DIAGNOSIS — F411 Generalized anxiety disorder: Secondary | ICD-10-CM | POA: Diagnosis not present

## 2022-08-02 DIAGNOSIS — R451 Restlessness and agitation: Secondary | ICD-10-CM | POA: Diagnosis not present

## 2022-08-02 DIAGNOSIS — Z79899 Other long term (current) drug therapy: Secondary | ICD-10-CM | POA: Diagnosis not present

## 2022-08-02 DIAGNOSIS — E785 Hyperlipidemia, unspecified: Secondary | ICD-10-CM | POA: Diagnosis not present

## 2022-08-02 DIAGNOSIS — E559 Vitamin D deficiency, unspecified: Secondary | ICD-10-CM | POA: Diagnosis not present

## 2022-08-02 DIAGNOSIS — Z86718 Personal history of other venous thrombosis and embolism: Secondary | ICD-10-CM | POA: Diagnosis not present

## 2022-08-02 DIAGNOSIS — G309 Alzheimer's disease, unspecified: Secondary | ICD-10-CM | POA: Diagnosis not present

## 2022-08-09 ENCOUNTER — Ambulatory Visit (INDEPENDENT_AMBULATORY_CARE_PROVIDER_SITE_OTHER): Payer: PPO

## 2022-08-09 DIAGNOSIS — R001 Bradycardia, unspecified: Secondary | ICD-10-CM | POA: Diagnosis not present

## 2022-08-10 LAB — CUP PACEART REMOTE DEVICE CHECK
Battery Voltage: 95
Date Time Interrogation Session: 20231224075418
Implantable Lead Connection Status: 753985
Implantable Lead Implant Date: 20220926
Implantable Lead Location: 753860
Implantable Lead Model: 377171
Implantable Lead Serial Number: 8000535066
Implantable Pulse Generator Implant Date: 20220926
Pulse Gen Model: 407157
Pulse Gen Serial Number: 70243330

## 2022-09-01 DIAGNOSIS — M810 Age-related osteoporosis without current pathological fracture: Secondary | ICD-10-CM | POA: Diagnosis not present

## 2022-09-01 DIAGNOSIS — G43909 Migraine, unspecified, not intractable, without status migrainosus: Secondary | ICD-10-CM | POA: Diagnosis not present

## 2022-09-01 DIAGNOSIS — G309 Alzheimer's disease, unspecified: Secondary | ICD-10-CM | POA: Diagnosis not present

## 2022-09-01 DIAGNOSIS — E785 Hyperlipidemia, unspecified: Secondary | ICD-10-CM | POA: Diagnosis not present

## 2022-09-02 NOTE — Progress Notes (Signed)
Remote pacemaker transmission.   

## 2022-09-08 ENCOUNTER — Other Ambulatory Visit: Payer: Self-pay | Admitting: *Deleted

## 2022-09-08 MED ORDER — MEMANTINE HCL 10 MG PO TABS: 10.0000 mg | ORAL_TABLET | Freq: Two times a day (BID) | ORAL | 0 refills | Status: DC

## 2022-09-12 IMAGING — RF DG FEMUR 2+V*R*
1 series · 4 of 4 positions shown · non-contrast
Comparison: Film from earlier in the same day.

CLINICAL DATA: Known right femoral fracture

EXAM:
RIGHT FEMUR 2 VIEWS

[Series 1: unknown protocol · 0.14mm/px · 4 of 4 slices shown]
[im 1/4]
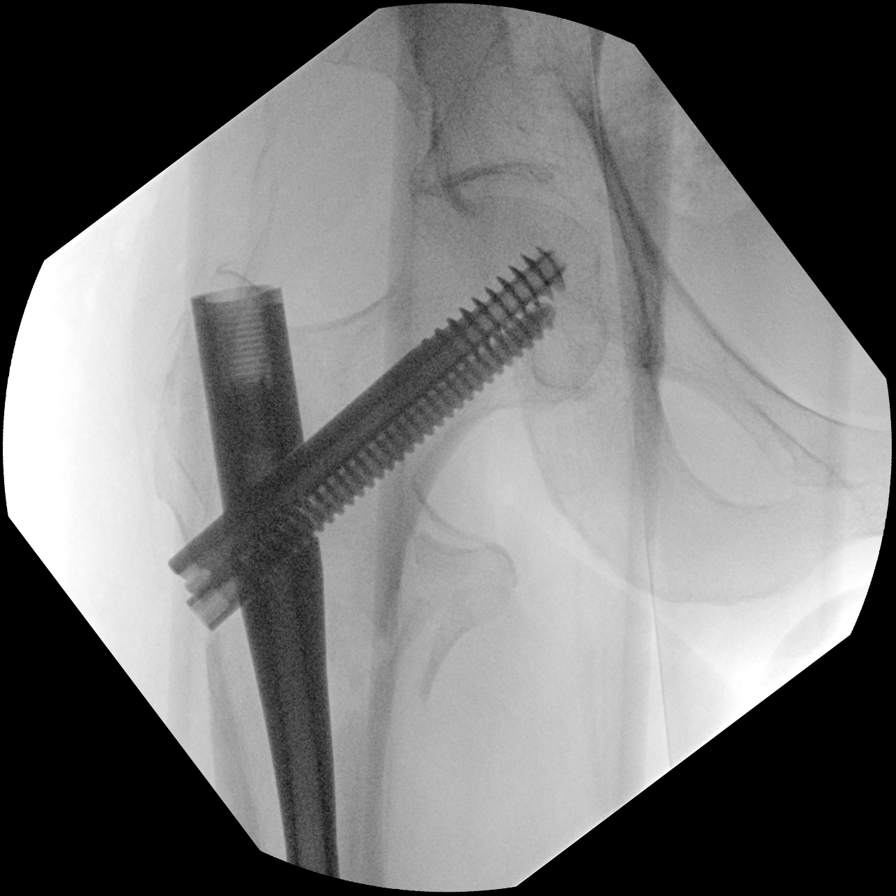
[im 2/4]
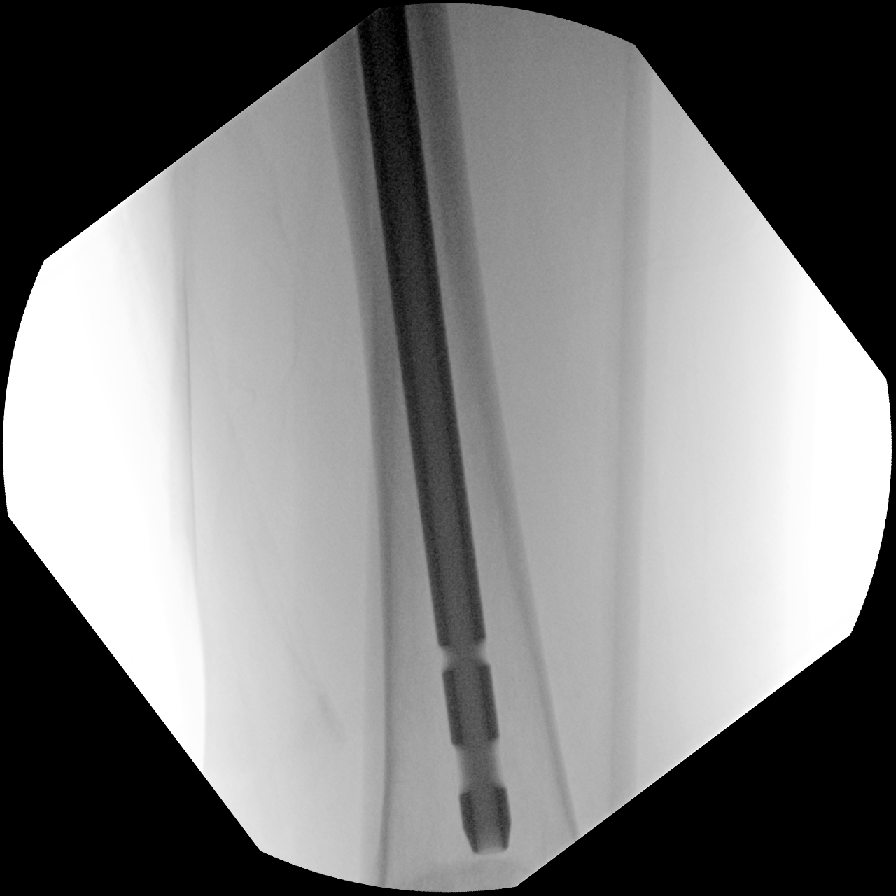
[im 3/4]
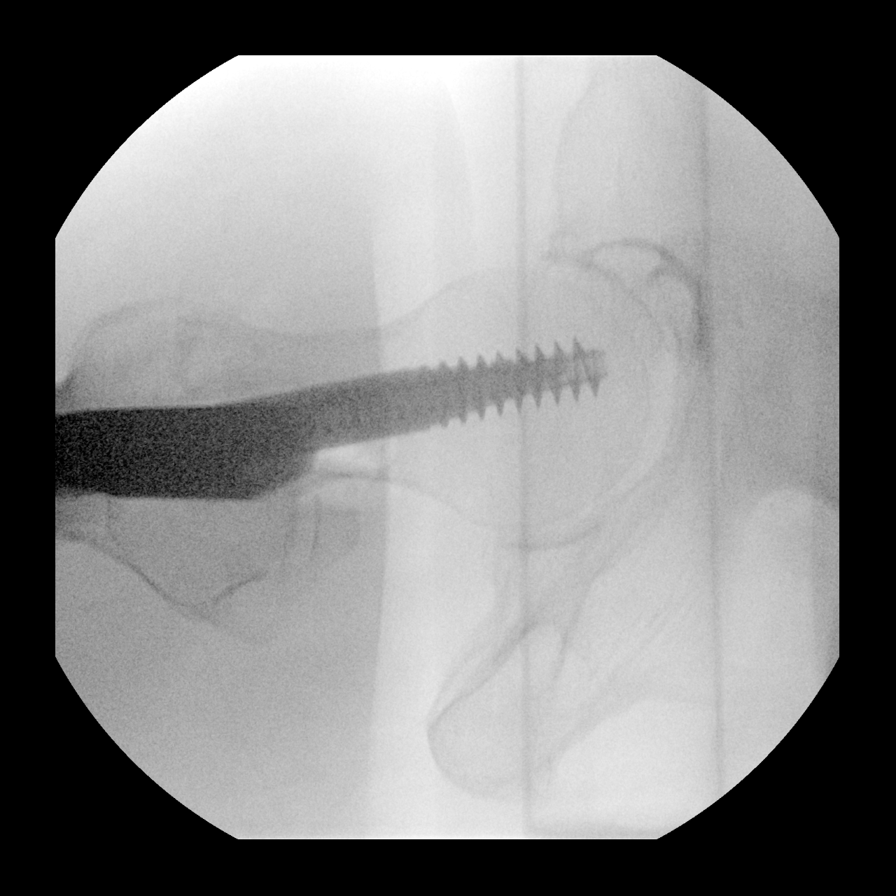
[im 4/4]
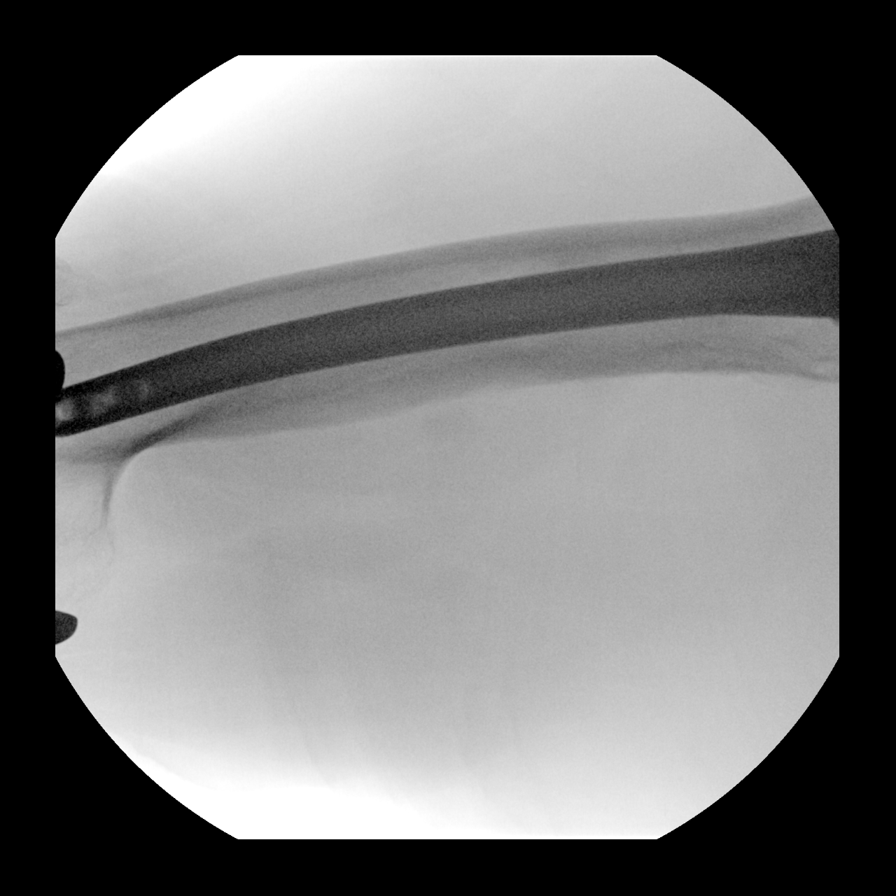

[4 of 4 positions shown; findings below may reference images not displayed]

FLUOROSCOPY TIME:  Radiation Exposure Index (as provided by the
fluoroscopic device): 17.07 mGy

If the device does not provide the exposure index:

Fluoroscopy Time:  48 seconds

Number of Acquired Images:  4
FINDINGS: Medullary rod and fixation screws are noted in the proximal right
femur. Fracture fragments are in near anatomic alignment.
IMPRESSION: Status post ORIF of proximal right femoral fracture.

## 2022-09-12 IMAGING — CR DG HIP (WITH OR WITHOUT PELVIS) 2-3V*R*
3 series · 3 of 3 positions shown · non-contrast
Comparison: None.

CLINICAL DATA: Fall with RIGHT hip pain.

EXAM:
DG HIP (WITH OR WITHOUT PELVIS) 2-3V RIGHT

[x pelvis]
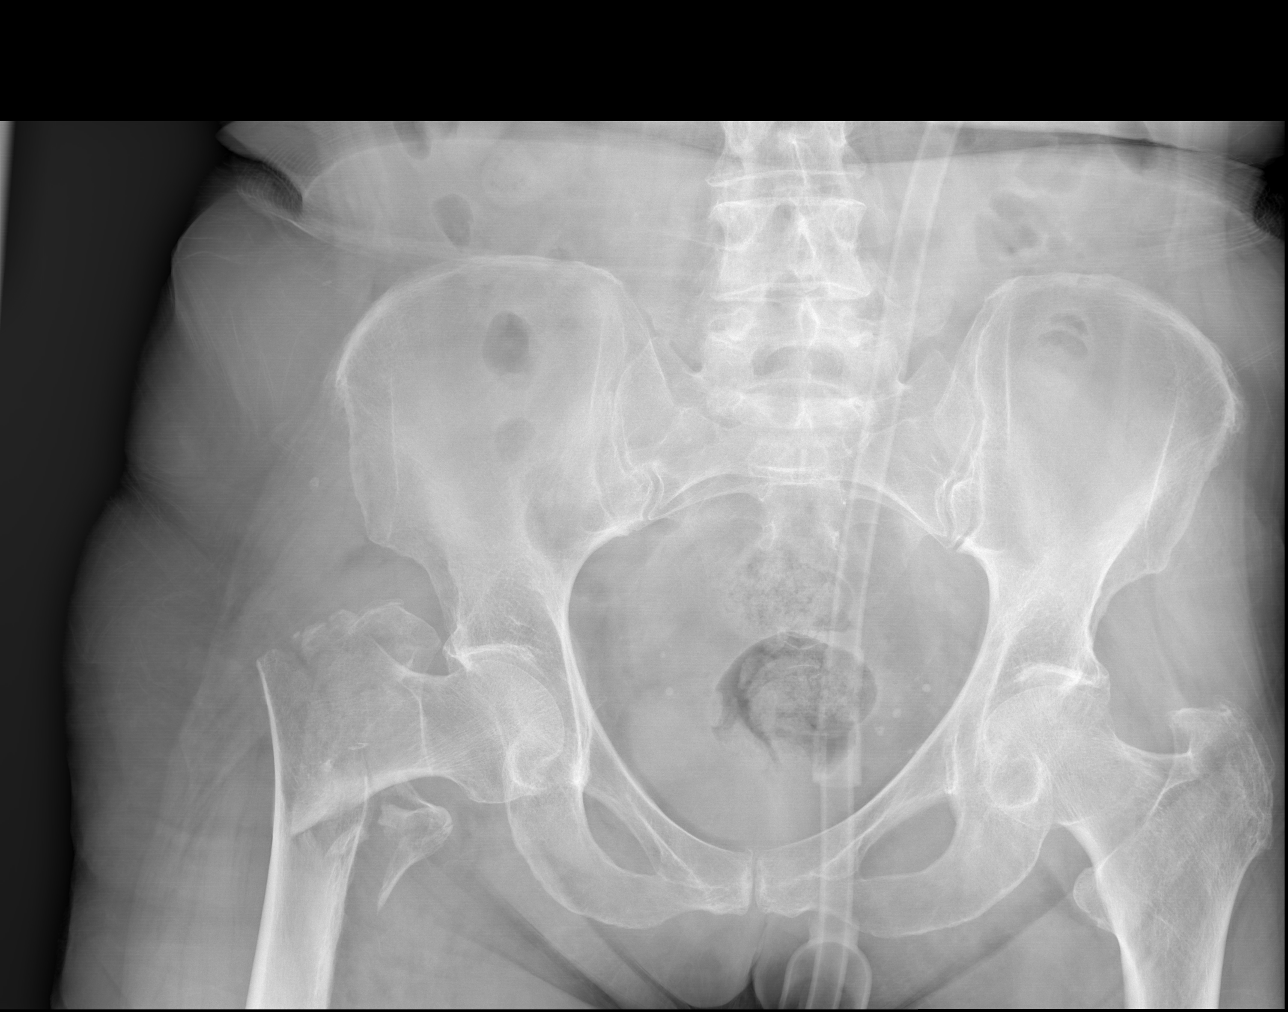

[x hip ap right]
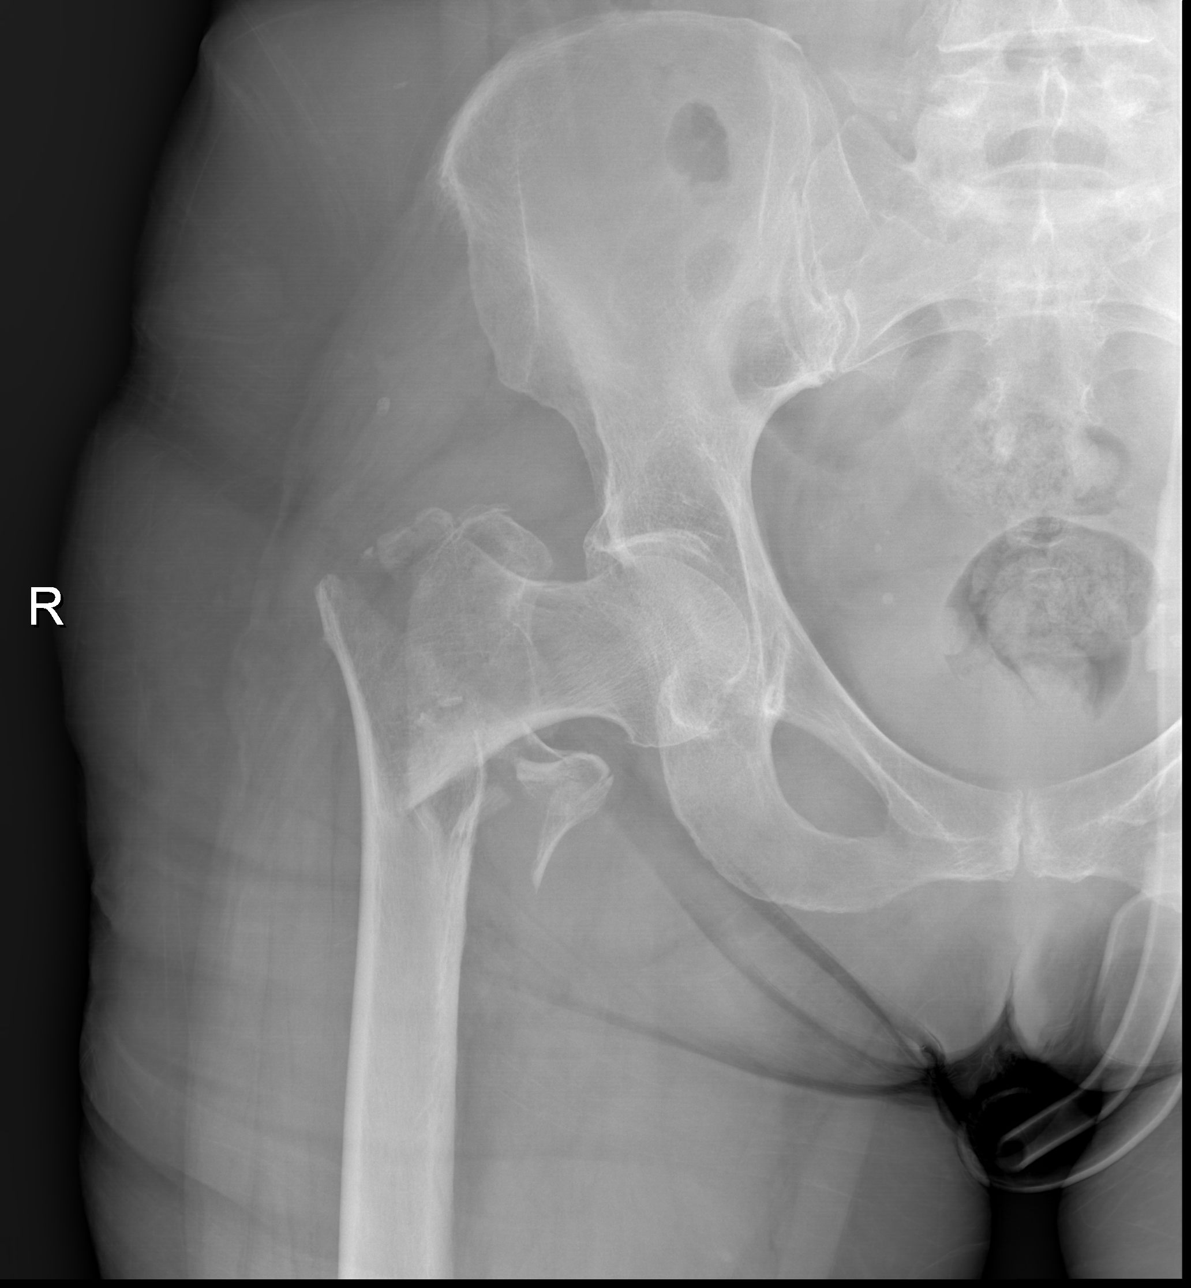

[w hip lat right]
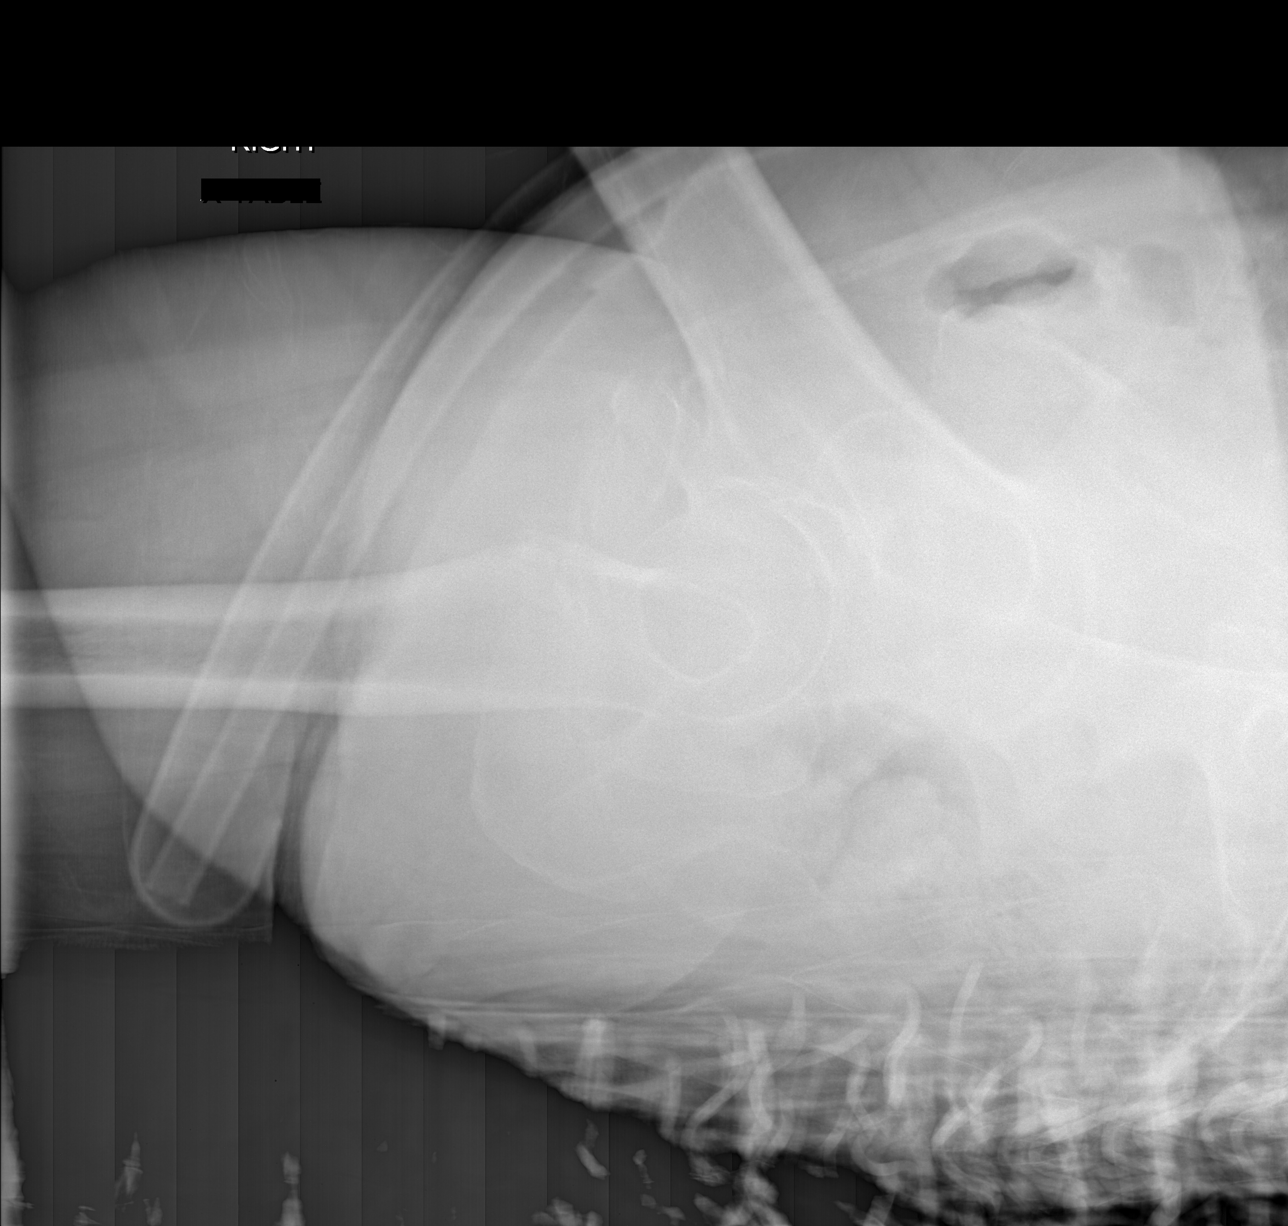

[3 of 3 positions shown; findings below may reference images not displayed]

FINDINGS: Markedly displaced/comminuted fracture of the RIGHT femoral neck,
centered at the trochanters, with 90 degree angulation deformity at
the fracture site. Femoral head remains grossly well positioned
relative to the acetabulum.

Remainder of the osseous pelvis appears intact and normally aligned.
IMPRESSION: Markedly displaced/comminuted fracture of the RIGHT femoral neck,
intertrochanteric, with 90 degree angulation deformity at the
fracture site.

## 2022-11-08 ENCOUNTER — Ambulatory Visit (INDEPENDENT_AMBULATORY_CARE_PROVIDER_SITE_OTHER): Payer: PPO

## 2022-11-08 DIAGNOSIS — R55 Syncope and collapse: Secondary | ICD-10-CM

## 2022-11-08 LAB — CUP PACEART REMOTE DEVICE CHECK
Battery Voltage: 90
Date Time Interrogation Session: 20240326110020
Implantable Lead Connection Status: 753985
Implantable Lead Implant Date: 20220926
Implantable Lead Location: 753860
Implantable Lead Model: 377171
Implantable Lead Serial Number: 8000535066
Implantable Pulse Generator Implant Date: 20220926
Pulse Gen Model: 407157
Pulse Gen Serial Number: 70243330

## 2022-12-05 ENCOUNTER — Other Ambulatory Visit: Payer: Self-pay | Admitting: *Deleted

## 2022-12-05 MED ORDER — MEMANTINE HCL 10 MG PO TABS: 10.0000 mg | ORAL_TABLET | Freq: Two times a day (BID) | ORAL | 0 refills | Status: DC

## 2022-12-05 NOTE — Telephone Encounter (Signed)
Last seen on 06/02/22 per note "We will continue memantine and donepezil." Follow up scheduled on 12/13/22 Last filled on 09/09/22 #180 tablets (90 day supply)

## 2022-12-13 ENCOUNTER — Ambulatory Visit: Payer: PPO | Admitting: Family Medicine

## 2022-12-20 NOTE — Progress Notes (Signed)
Remote pacemaker transmission.   

## 2022-12-21 ENCOUNTER — Other Ambulatory Visit: Payer: Self-pay

## 2022-12-21 MED ORDER — DONEPEZIL HCL 10 MG PO TABS: ORAL_TABLET | ORAL | 0 refills | Status: DC

## 2023-02-01 DIAGNOSIS — Z950 Presence of cardiac pacemaker: Secondary | ICD-10-CM | POA: Diagnosis not present

## 2023-02-01 DIAGNOSIS — F0280 Dementia in other diseases classified elsewhere without behavioral disturbance: Secondary | ICD-10-CM | POA: Diagnosis not present

## 2023-02-01 DIAGNOSIS — Z6827 Body mass index (BMI) 27.0-27.9, adult: Secondary | ICD-10-CM | POA: Diagnosis not present

## 2023-02-01 DIAGNOSIS — F411 Generalized anxiety disorder: Secondary | ICD-10-CM | POA: Diagnosis not present

## 2023-02-01 DIAGNOSIS — R451 Restlessness and agitation: Secondary | ICD-10-CM | POA: Diagnosis not present

## 2023-02-01 DIAGNOSIS — M810 Age-related osteoporosis without current pathological fracture: Secondary | ICD-10-CM | POA: Diagnosis not present

## 2023-02-01 DIAGNOSIS — G309 Alzheimer's disease, unspecified: Secondary | ICD-10-CM | POA: Diagnosis not present

## 2023-02-07 ENCOUNTER — Ambulatory Visit (INDEPENDENT_AMBULATORY_CARE_PROVIDER_SITE_OTHER): Payer: PPO

## 2023-02-07 DIAGNOSIS — R55 Syncope and collapse: Secondary | ICD-10-CM | POA: Diagnosis not present

## 2023-02-13 LAB — CUP PACEART REMOTE DEVICE CHECK
Date Time Interrogation Session: 20240628130012
Implantable Lead Connection Status: 753985
Implantable Lead Implant Date: 20220926
Implantable Lead Location: 753860
Implantable Lead Model: 377171
Implantable Lead Serial Number: 8000535066
Implantable Pulse Generator Implant Date: 20220926
Pulse Gen Model: 407157
Pulse Gen Serial Number: 70243330

## 2023-03-03 NOTE — Progress Notes (Signed)
Remote pacemaker transmission.   

## 2023-03-07 NOTE — Patient Instructions (Signed)
Below is our plan:  We will continue donepezil 10mg and memantine 10mg daily  Please make sure you are staying well hydrated. I recommend 50-60 ounces daily. Well balanced diet and regular exercise encouraged. Consistent sleep schedule with 6-8 hours recommended.   Please continue follow up with care team as directed.   Follow up with me in 1 year   You may receive a survey regarding today's visit. I encourage you to leave honest feed back as I do use this information to improve patient care. Thank you for seeing me today!   Management of Memory Problems   There are some general things you can do to help manage your memory problems.  Your memory may not in fact recover, but by using techniques and strategies you will be able to manage your memory difficulties better.   1)  Establish a routine. Try to establish and then stick to a regular routine.  By doing this, you will get used to what to expect and you will reduce the need to rely on your memory.  Also, try to do things at the same time of day, such as taking your medication or checking your calendar first thing in the morning. Think about think that you can do as a part of a regular routine and make a list.  Then enter them into a daily planner to remind you.  This will help you establish a routine.   2)  Organize your environment. Organize your environment so that it is uncluttered.  Decrease visual stimulation.  Place everyday items such as keys or cell phone in the same place every day (ie.  Basket next to front door) Use post it notes with a brief message to yourself (ie. Turn off light, lock the door) Use labels to indicate where things go (ie. Which cupboards are for food, dishes, etc.) Keep a notepad and pen by the telephone to take messages   3)  Memory Aids A diary or journal/notebook/daily planner Making a list (shopping list, chore list, to do list that needs to be done) Using an alarm as a reminder (kitchen timer or cell  phone alarm) Using cell phone to store information (Notes, Calendar, Reminders) Calendar/White board placed in a prominent position Post-it notes   In order for memory aids to be useful, you need to have good habits.  It's no good remembering to make a note in your journal if you don't remember to look in it.  Try setting aside a certain time of day to look in journal.   4)  Improving mood and managing fatigue. There may be other factors that contribute to memory difficulties.  Factors, such as anxiety, depression and tiredness can affect memory. Regular gentle exercise can help improve your mood and give you more energy. Exercise: there are short videos created by the National Institute on Health specially for older adults: https://bit.ly/2I30q97.  Mediterranean diet: which emphasizes fruits, vegetables, whole grains, legumes, fish, and other seafood; unsaturated fats such as olive oils; and low amounts of red meat, eggs, and sweets. A variation of this, called MIND (Mediterranean-DASH Intervention for Neurodegenerative Delay) incorporates the DASH (Dietary Approaches to Stop Hypertension) diet, which has been shown to lower high blood pressure, a risk factor for Alzheimer's disease. More information at: https://www.nia.nih.gov/health/what-do-we-know-about-diet-and-prevention-alzheimers-disease.  Aerobic exercise that improve heart health is also good for the mind.  National Institute on Aging have short videos for exercises that you can do at home: www.nia.nih.gov/Go4Life Simple relaxation techniques may help relieve   symptoms of anxiety Try to get back to completing activities or hobbies you enjoyed doing in the past. Learn to pace yourself through activities to decrease fatigue. Find out about some local support groups where you can share experiences with others. Try and achieve 7-8 hours of sleep at night.   Resources for Family/Caregiver  Online caregiver support groups can be found at  alz.org or call Alzheimer's Association's 24/7 hotline: 800.272.3900. [facility] offers in-person, virtual support groups and individual counseling for both care partners and persons with memory loss. Call for more information at [phone].   Advanced care plan: there are two types of Power of Attorney: healthcare and durable. Healthcare POA is a designated person to make healthcare decisions on your behalf if you were too sick to make them yourself. This person can be selected and documented by your physician. Durable POA has to be set up with a lawyer who takes charge of your finances and estate if you were too sick or cognitively impaired to manage your finances accurately. You can find a local Elder Law lawyer here: https://www.naela.org/.  Check out www.planyourlifespan.org, which will help you plan before a crisis and decide who will take care of life considerations in a circumstance where you may not be able to speak for yourself.   Helpful books (available on Amazon or your local bookstore):  By Dr. Ed Shaw: Keeping Love Alive as Memories Fade: The 5 Love Languages and the Alzheimer's Journey May 16, 2015 The Dementia Care Partner's Workbook: A Guide for Understanding, Education, and Hope Paperback - January 13, 2018.  Both available for less than $15.   "Coping with behavior change in dementia: a family caregiver's guide" by Beth Spencer & Laurie White "A Caregiver's Guide to Dementia: Using Activities and Other Strategies to Prevent, Reduce and Manage Behavioral Symptoms" by Laura N. Gitlin and Catherine Piersol.  "Creating Moments of Joy for the Person with Alzheimer's or Dementia" 4th edition by Jolene Brackrey  Caregiver videos on common behaviors related to dementia: https://www.[facility].org/dementia/caregiver-education-videos  [facility] Caregiver Portal: free to sign up, links to local resources: [email or web address]  

## 2023-03-07 NOTE — Progress Notes (Unsigned)
No chief complaint on file.   HISTORY OF PRESENT ILLNESS:  03/07/23 ALL: Wendy Watts returns for follow up for AD. She was last seen 05/2022 and experiencing more agitation. We increased buspirone to 10mg  TID PRN. Since,   06/02/2022 ALL: Wendy Watts returns for follow up for AD. She continues memantine and donepezil. Memory loss has progressed. She is more irritable. PCP switched escitalopram to Wellbutrin and this seems to be well tolerated. Her daughter felt that appetite was increased on escitalopram. Seems to have a normal appetite now. She requires assistance with ADLs. She is irritably with baths. She seems to be getting up more at night. PCP started trazodone 25mg  QHS. She is going to [facility] twice a week. She seems to do better and sleeps better on these days.   12/01/2021 ALL: Wendy Watts returns for follow up for AD. She continues memantine 10mg  BID and donepezil 10mg  daily. She presents with her daughter, today, who aids in history.  She had a fall last summer and broke her hip. She had progressive decline in memory following. She lost about 20 pounds but has started to gain back. She loves sweets. She had a large DVT following hip fracture. She had recently had Covid booster. During hospitalization she had some bradycardia with prolonged pauses. She is s/p pacemaker placement. She is back home. She is walking much better. Able to walk without assistive device. No falls. She is taking vitamin D supplements. She is scheduled for bone density test in September. She is having more difficulty with irritability. She does not like to take medications. She does not like to shower. She was started on escitalopram which seems to have helped some. She is wearing depends. She does recognize the need to urinate at times.  07/28/2020 ALL:  Wendy Watts is a 77 y.o. female here today for follow up for AD. We increased memantine to 10mg  BID and continued Aricept at last visit.  She has tolerated medications  well.  No obvious adverse effects.  She feels that memory is fairly stable.  Her husband is with her today and agrees.  No significant changes.  No behavioral changes.  She continues to perform ADLs with minimal assistance.  She does not drive.  HISTORY (copied from previous note)  Wendy Watts is a 77 y.o. female here today for follow up. She has continued Aricept 10mg  daily. Namenda 5mg  BID was added in 10/2019. She has tolerated it well. Memory seems stable. Her husband is with her today and aids in history. She continues to perform ADL's independently. She may need some assistance with getting in and out of the shower. Her daughter lays out her clothes. She does not drive. She reports being active. She likes to dance. No falls. Her husband reports that they are active and get out of the house every day.    HISTORY: (copied from my note on 11/06/2019)   Wendy Watts is a 77 y.o. female here today for follow up for dementia. She has tolerated Aricept 10mg  daily. Wendy Watts feels that it has helped. Her daughter is with her today and is not certain. She requires assistance with ADL's.  Her daughter will lay out her close but she is able to put garments on herself.  Her husband assist her with getting in and out of the bathtub.  Wendy Watts is able to wash herself.  Her husband helps with medications. Husband and daughter help with finances. She does not drive. She did have  a fall in October.. She stumbled up set of stairs. She did hit her head but reports only bruising. She has been followed closely by PCP. She was recently started on Buspar. She does feel that it has helped some.  Her son is currently awaiting trial for theft.  Her daughter mentions today that patient may have history of bipolar disorder.  There is been a strained relationship between mother and daughter for years.   REVIEW OF SYSTEMS: Out of a complete 14 system review of symptoms, the patient complains only of the following symptoms, memory  loss, irritability and all other reviewed systems are negative.   ALLERGIES: Allergies  Allergen Reactions   Allegra [Fexofenadine Hcl] Other (See Comments)    "takes me to another world"   Ativan [Lorazepam] Other (See Comments)    Hallucinations   Azithromycin Nausea And Vomiting   Codeine Hives   Doxycycline Hyclate Nausea Only   Penicillins Hives    Has patient had a PCN reaction causing immediate rash, facial/tongue/throat swelling, SOB or lightheadedness with hypotension: no Has patient had a PCN reaction causing severe rash involving mucus membranes or skin necrosis: no Has patient had a PCN reaction that required hospitalization: no Has patient had a PCN reaction occurring within the last 10 years: no If all of the above answers are "NO", then may proceed with Cephalosporin use.    Trazodone And Nefazodone Nausea Only   Zyrtec [Cetirizine] Other (See Comments)    "takes me to another world"     HOME MEDICATIONS: Outpatient Medications Prior to Visit  Medication Sig Dispense Refill   buPROPion (WELLBUTRIN XL) 150 MG 24 hr tablet Take 150 mg by mouth daily.     busPIRone (BUSPAR) 10 MG tablet Take 1 tablet (10 mg total) by mouth 3 (three) times daily. 270 tablet 3   donepezil (ARICEPT) 10 MG tablet TAKE 1 TABLET(10 MG) BY MOUTH AT BEDTIME 90 tablet 0   ELIQUIS 5 MG TABS tablet TAKE 1 TABLET(5 MG) BY MOUTH TWICE DAILY 60 tablet 0   memantine (NAMENDA) 10 MG tablet Take 1 tablet (10 mg total) by mouth 2 (two) times daily. 180 tablet 0   traZODone (DESYREL) 50 MG tablet Take 50 mg by mouth at bedtime.     vitamin B-12 (CYANOCOBALAMIN) 1000 MCG tablet Take 1 tablet (1,000 mcg total) by mouth daily. 30 tablet 0   VITAMIN D PO Take 5,000 Units by mouth daily.     Wheat Dextrin (BENEFIBER DRINK MIX) PACK Take 1 Scoop by mouth daily. Mix in applesauce     No facility-administered medications prior to visit.     PAST MEDICAL HISTORY: Past Medical History:  Diagnosis Date    Anxiety    Dementia (HCC)    High cholesterol    Vertigo      PAST SURGICAL HISTORY: Past Surgical History:  Procedure Laterality Date   HYSTERECTOMY     INTRAMEDULLARY (IM) NAIL INTERTROCHANTERIC Right 03/28/2021   Procedure: INTRAMEDULLARY (IM) NAIL INTERTROCHANTRIC;  Surgeon: Cammy Copa, MD;  Location: [facility];  Service: Orthopedics;  Laterality: Right;   PACEMAKER IMPLANT N/A 05/10/2021   Procedure: PACEMAKER IMPLANT;  Surgeon: Lanier Prude, MD;  Location: [facility] INVASIVE CV LAB;  Service: Cardiovascular;  Laterality: N/A;   PERIPHERAL VASCULAR THROMBECTOMY N/A 05/04/2021   Procedure: PERIPHERAL VASCULAR THROMBECTOMY - VENUS;  Surgeon: Nada Libman, MD;  Location: [facility] INVASIVE CV LAB;  Service: Cardiovascular;  Laterality: N/A;   TOTAL KNEE ARTHROPLASTY Right  FAMILY HISTORY: Family History  Problem Relation Age of Onset   Dementia Neg Hx    Alzheimer's disease Neg Hx      SOCIAL HISTORY: Social History   Socioeconomic History   Marital status: Married    Spouse name: Not on file   Number of children: 2   Years of education: Not on file   Highest education level: High school graduate  Occupational History   Not on file  Tobacco Use   Smoking status: Never   Smokeless tobacco: Never  Substance and Sexual Activity   Alcohol use: Never   Drug use: Never   Sexual activity: Not on file  Other Topics Concern   Not on file  Social History Narrative   LIves at home with husband and son   Social Determinants of Health   Financial Resource Strain: Not on file  Food Insecurity: Not on file  Transportation Needs: Not on file  Physical Activity: Not on file  Stress: Not on file  Social Connections: Not on file  Intimate Partner Violence: Not on file      PHYSICAL EXAM  There were no vitals filed for this visit.    There is no height or weight on file to calculate BMI.   Generalized: Well developed, in no acute distress  Cardiology:  normal rate and rhythm, no murmur auscultated  Respiratory: clear to auscultation bilaterally    Neurological examination  Mentation: Alert, she is not oriented to time, but able to tell me place, aid in little history taking. Unable to carry on meaningful conversation. Follows all commands speech and language fluent Cranial nerve II-XII: Pupils were equal round reactive to light. Extraocular movements were full, visual field were full on confrontational test. Facial sensation and strength were normal. Head turning and shoulder shrug  were normal and symmetric. Motor: The motor testing reveals 5 over 5 strength of all 4 extremities. Good symmetric motor tone is noted throughout.  Coordination: Cerebellar testing reveals good finger-nose-finger and heel-to-shin bilaterally.  Gait and station: Pushes to standing position without difficulty. Gait is mildly arthritic, stable without assistive device.     DIAGNOSTIC DATA (LABS, IMAGING, TESTING) - I reviewed patient records, labs, notes, testing and imaging myself where available.  Lab Results  Component Value Date   WBC 6.0 05/10/2021   HGB 10.7 (L) 05/10/2021   HCT 33.2 (L) 05/10/2021   MCV 94.3 05/10/2021   PLT 284 05/10/2021      Component Value Date/Time   NA 138 05/10/2021 0124   NA 142 05/08/2019 1156   K 3.6 05/10/2021 0124   CL 106 05/10/2021 0124   CO2 26 05/10/2021 0124   GLUCOSE 91 05/10/2021 0124   BUN 9 05/10/2021 0124   BUN 11 05/08/2019 1156   CREATININE 0.82 05/10/2021 0124   CALCIUM 9.5 05/10/2021 0124   PROT 5.5 (L) 04/28/2021 0035   PROT 6.8 05/08/2019 1156   ALBUMIN 2.5 (L) 04/28/2021 0035   ALBUMIN 4.1 05/08/2019 1156   AST 16 04/28/2021 0035   ALT 17 04/28/2021 0035   ALKPHOS 109 04/28/2021 0035   BILITOT 1.1 04/28/2021 0035   BILITOT 0.6 05/08/2019 1156   GFRNONAA >60 05/10/2021 0124   GFRAA 72 05/08/2019 1156   No results found for: "CHOL", "HDL", "LDLCALC", "LDLDIRECT", "TRIG", "CHOLHDL" Lab  Results  Component Value Date   HGBA1C 5.4 03/29/2021   Lab Results  Component Value Date   VITAMINB12 186 05/02/2021   Lab Results  Component Value Date  TSH 0.562 03/28/2021       12/01/2021    3:38 PM 07/28/2020   10:48 AM 02/06/2020   10:02 AM  MMSE - Mini Mental State Exam  Orientation to time 0 0 0  Orientation to Place 5 4 3   Registration 3 3 3   Attention/ Calculation 0 0 0  Recall 1 1 2   Language- name 2 objects 2 2 2   Language- repeat 0 1 0  Language- follow 3 step command 2 2 3   Language- read & follow direction 1 1 1   Write a sentence 1 1 1   Copy design 0 0 1  Total score 15 15 16      ASSESSMENT AND PLAN  77 y.o. year old female  has a past medical history of Anxiety, Dementia (HCC), High cholesterol, and Vertigo. here with   No diagnosis found.  Yesica is doing well, today.  Memory seems to be fairly stable considering all that she has been through over the past year. MMSE not completed at this visit. Previously 15/30. She has tolerated Aricept and Namenda.  We will continue current treatment plan. Mood seems better on Wellbutrin and trazodone started with PCP. She may consider adding melatonin extended release OTC. She will continue close follow up with Dr Tenny Craw. I have increased buspirone dose to 10mg  TID. Her daughter was advised to give 10mg  BID, everyday, and give third dose during day for increased anxiety/agitation. She was encouraged to continue regular physical and mental activity. Fall precautions advised. Memory compensation strategies reviewed.  She will follow-up with me in 6 months.  She and her daughter both verbalized understanding and agreement with this plan.  No orders of the defined types were placed in this encounter.   I spent 30 minutes of face-to-face and non-face-to-face time with patient.  This included previsit chart review, lab review, study review, order entry, electronic health record documentation, patient education.   Shawnie Dapper,  MSN, FNP-C 03/07/2023, 11:19 AM  [facility] [address], [address] [place], [place] [address] [phone]

## 2023-03-08 ENCOUNTER — Encounter: Payer: Self-pay | Admitting: Family Medicine

## 2023-03-08 ENCOUNTER — Ambulatory Visit (INDEPENDENT_AMBULATORY_CARE_PROVIDER_SITE_OTHER): Payer: PPO | Admitting: Family Medicine

## 2023-03-08 VITALS — BP 124/74 | HR 63 | Ht 66.0 in | Wt 147.2 lb

## 2023-03-08 DIAGNOSIS — G309 Alzheimer's disease, unspecified: Secondary | ICD-10-CM

## 2023-03-08 MED ORDER — MEMANTINE HCL 10 MG PO TABS: 10.0000 mg | ORAL_TABLET | Freq: Two times a day (BID) | ORAL | 3 refills | Status: AC

## 2023-03-08 MED ORDER — DONEPEZIL HCL 10 MG PO TABS: ORAL_TABLET | ORAL | 3 refills | Status: DC

## 2023-03-08 MED ORDER — BUSPIRONE HCL 10 MG PO TABS: ORAL_TABLET | ORAL | 3 refills | Status: DC

## 2023-03-13 DIAGNOSIS — R300 Dysuria: Secondary | ICD-10-CM | POA: Diagnosis not present

## 2023-03-13 DIAGNOSIS — Z6827 Body mass index (BMI) 27.0-27.9, adult: Secondary | ICD-10-CM | POA: Diagnosis not present

## 2023-03-13 DIAGNOSIS — R410 Disorientation, unspecified: Secondary | ICD-10-CM | POA: Diagnosis not present

## 2023-05-09 ENCOUNTER — Ambulatory Visit: Payer: PPO

## 2023-05-09 DIAGNOSIS — R55 Syncope and collapse: Secondary | ICD-10-CM

## 2023-05-12 LAB — CUP PACEART REMOTE DEVICE CHECK
Date Time Interrogation Session: 20240926073720
Implantable Lead Connection Status: 753985
Implantable Lead Implant Date: 20220926
Implantable Lead Location: 753860
Implantable Lead Model: 377171
Implantable Lead Serial Number: 8000535066
Implantable Pulse Generator Implant Date: 20220926
Pulse Gen Model: 407157
Pulse Gen Serial Number: 70243330

## 2023-05-25 NOTE — Progress Notes (Signed)
Remote pacemaker transmission.   

## 2023-06-22 DIAGNOSIS — H6091 Unspecified otitis externa, right ear: Secondary | ICD-10-CM | POA: Diagnosis not present

## 2023-06-22 DIAGNOSIS — J069 Acute upper respiratory infection, unspecified: Secondary | ICD-10-CM | POA: Diagnosis not present

## 2023-06-26 ENCOUNTER — Inpatient Hospital Stay (HOSPITAL_BASED_OUTPATIENT_CLINIC_OR_DEPARTMENT_OTHER)
Admission: EM | Admit: 2023-06-26 | Discharge: 2023-06-30 | DRG: 689 | Disposition: A | Discharge status: Home Health | Payer: PPO | Attending: Internal Medicine | Admitting: Internal Medicine

## 2023-06-26 ENCOUNTER — Encounter (HOSPITAL_BASED_OUTPATIENT_CLINIC_OR_DEPARTMENT_OTHER): Payer: Self-pay

## 2023-06-26 ENCOUNTER — Other Ambulatory Visit: Payer: Self-pay

## 2023-06-26 ENCOUNTER — Emergency Department (HOSPITAL_BASED_OUTPATIENT_CLINIC_OR_DEPARTMENT_OTHER): Payer: PPO

## 2023-06-26 DIAGNOSIS — F05 Delirium due to known physiological condition: Secondary | ICD-10-CM | POA: Diagnosis present

## 2023-06-26 DIAGNOSIS — E7800 Pure hypercholesterolemia, unspecified: Secondary | ICD-10-CM | POA: Diagnosis present

## 2023-06-26 DIAGNOSIS — F02818 Dementia in other diseases classified elsewhere, unspecified severity, with other behavioral disturbance: Secondary | ICD-10-CM | POA: Diagnosis present

## 2023-06-26 DIAGNOSIS — R4182 Altered mental status, unspecified: Secondary | ICD-10-CM | POA: Diagnosis not present

## 2023-06-26 DIAGNOSIS — Z91041 Radiographic dye allergy status: Secondary | ICD-10-CM

## 2023-06-26 DIAGNOSIS — N179 Acute kidney failure, unspecified: Secondary | ICD-10-CM | POA: Diagnosis present

## 2023-06-26 DIAGNOSIS — Z950 Presence of cardiac pacemaker: Secondary | ICD-10-CM

## 2023-06-26 DIAGNOSIS — Z880 Allergy status to penicillin: Secondary | ICD-10-CM

## 2023-06-26 DIAGNOSIS — R918 Other nonspecific abnormal finding of lung field: Secondary | ICD-10-CM | POA: Diagnosis not present

## 2023-06-26 DIAGNOSIS — G9341 Metabolic encephalopathy: Secondary | ICD-10-CM | POA: Diagnosis present

## 2023-06-26 DIAGNOSIS — Z882 Allergy status to sulfonamides status: Secondary | ICD-10-CM

## 2023-06-26 DIAGNOSIS — F03918 Unspecified dementia, unspecified severity, with other behavioral disturbance: Secondary | ICD-10-CM | POA: Diagnosis present

## 2023-06-26 DIAGNOSIS — Z885 Allergy status to narcotic agent status: Secondary | ICD-10-CM

## 2023-06-26 DIAGNOSIS — Z96651 Presence of right artificial knee joint: Secondary | ICD-10-CM | POA: Diagnosis present

## 2023-06-26 DIAGNOSIS — E16A2 Hypoglycemia level 2: Secondary | ICD-10-CM | POA: Diagnosis present

## 2023-06-26 DIAGNOSIS — Z79899 Other long term (current) drug therapy: Secondary | ICD-10-CM

## 2023-06-26 DIAGNOSIS — Z881 Allergy status to other antibiotic agents status: Secondary | ICD-10-CM

## 2023-06-26 DIAGNOSIS — E162 Hypoglycemia, unspecified: Secondary | ICD-10-CM

## 2023-06-26 DIAGNOSIS — B961 Klebsiella pneumoniae [K. pneumoniae] as the cause of diseases classified elsewhere: Secondary | ICD-10-CM | POA: Diagnosis present

## 2023-06-26 DIAGNOSIS — Z90710 Acquired absence of both cervix and uterus: Secondary | ICD-10-CM

## 2023-06-26 DIAGNOSIS — Z9181 History of falling: Secondary | ICD-10-CM

## 2023-06-26 DIAGNOSIS — Z87440 Personal history of urinary (tract) infections: Secondary | ICD-10-CM

## 2023-06-26 DIAGNOSIS — Z7901 Long term (current) use of anticoagulants: Secondary | ICD-10-CM

## 2023-06-26 DIAGNOSIS — Z86718 Personal history of other venous thrombosis and embolism: Secondary | ICD-10-CM

## 2023-06-26 DIAGNOSIS — Z888 Allergy status to other drugs, medicaments and biological substances status: Secondary | ICD-10-CM

## 2023-06-26 DIAGNOSIS — N390 Urinary tract infection, site not specified: Secondary | ICD-10-CM | POA: Diagnosis not present

## 2023-06-26 DIAGNOSIS — J9811 Atelectasis: Secondary | ICD-10-CM | POA: Diagnosis present

## 2023-06-26 DIAGNOSIS — R197 Diarrhea, unspecified: Secondary | ICD-10-CM | POA: Diagnosis not present

## 2023-06-26 DIAGNOSIS — Z515 Encounter for palliative care: Secondary | ICD-10-CM

## 2023-06-26 DIAGNOSIS — H6090 Unspecified otitis externa, unspecified ear: Secondary | ICD-10-CM | POA: Diagnosis present

## 2023-06-26 DIAGNOSIS — J90 Pleural effusion, not elsewhere classified: Secondary | ICD-10-CM | POA: Diagnosis not present

## 2023-06-26 DIAGNOSIS — H6091 Unspecified otitis externa, right ear: Secondary | ICD-10-CM | POA: Diagnosis present

## 2023-06-26 DIAGNOSIS — Z87442 Personal history of urinary calculi: Secondary | ICD-10-CM

## 2023-06-26 DIAGNOSIS — G309 Alzheimer's disease, unspecified: Secondary | ICD-10-CM | POA: Diagnosis present

## 2023-06-26 LAB — COMPREHENSIVE METABOLIC PANEL
ALT: 6 U/L (ref 0–44)
AST: 12 U/L — ABNORMAL LOW (ref 15–41)
Albumin: 3.8 g/dL (ref 3.5–5.0)
Alkaline Phosphatase: 85 U/L (ref 38–126)
Anion gap: 9 (ref 5–15)
BUN: 12 mg/dL (ref 8–23)
CO2: 25 mmol/L (ref 22–32)
Calcium: 9.9 mg/dL (ref 8.9–10.3)
Chloride: 100 mmol/L (ref 98–111)
Creatinine, Ser: 1.09 mg/dL — ABNORMAL HIGH (ref 0.44–1.00)
GFR, Estimated: 52 mL/min — ABNORMAL LOW (ref >60)
Glucose, Bld: 51 mg/dL — ABNORMAL LOW (ref 70–99)
Potassium: 5 mmol/L (ref 3.5–5.1)
Sodium: 134 mmol/L — ABNORMAL LOW (ref 135–145)
Total Bilirubin: 0.6 mg/dL (ref <1.2)
Total Protein: 7.5 g/dL (ref 6.5–8.1)

## 2023-06-26 LAB — URINALYSIS, ROUTINE W REFLEX MICROSCOPIC
Bilirubin Urine: NEGATIVE
Glucose, UA: NEGATIVE mg/dL
Ketones, ur: 40 mg/dL — AB
Nitrite: POSITIVE — AB
Protein, ur: 30 mg/dL — AB
Specific Gravity, Urine: 1.035 — ABNORMAL HIGH (ref 1.005–1.030)
WBC, UA: >50 WBC/hpf (ref 0–5)
pH: 6 (ref 5.0–8.0)

## 2023-06-26 LAB — CBC WITH DIFFERENTIAL/PLATELET
Abs Immature Granulocytes: 0.07 10*3/uL (ref 0.00–0.07)
Basophils Absolute: 0 10*3/uL (ref 0.0–0.1)
Basophils Relative: 0 %
Eosinophils Absolute: 0 10*3/uL (ref 0.0–0.5)
Eosinophils Relative: 0 %
HCT: 40.3 % (ref 36.0–46.0)
Hemoglobin: 13.7 g/dL (ref 12.0–15.0)
Immature Granulocytes: 1 %
Lymphocytes Relative: 16 %
Lymphs Abs: 1.7 10*3/uL (ref 0.7–4.0)
MCH: 30.9 pg (ref 26.0–34.0)
MCHC: 34 g/dL (ref 30.0–36.0)
MCV: 90.8 fL (ref 80.0–100.0)
Monocytes Absolute: 0.9 10*3/uL (ref 0.1–1.0)
Monocytes Relative: 9 %
Neutro Abs: 7.6 10*3/uL (ref 1.7–7.7)
Neutrophils Relative %: 74 %
Platelets: 311 10*3/uL (ref 150–400)
RBC: 4.44 MIL/uL (ref 3.87–5.11)
RDW: 13.2 % (ref 11.5–15.5)
WBC: 10.3 10*3/uL (ref 4.0–10.5)
nRBC: 0 % (ref 0.0–0.2)

## 2023-06-26 LAB — CBG MONITORING, ED: Glucose-Capillary: 94 mg/dL (ref 70–99)

## 2023-06-26 MED ORDER — OLANZAPINE 5 MG PO TABS
5.0000 mg | ORAL_TABLET | Freq: Once | ORAL | Status: DC
  Filled 2023-06-26: qty 1

## 2023-06-26 MED ORDER — QUETIAPINE FUMARATE 25 MG PO TABS
25.0000 mg | ORAL_TABLET | Freq: Every day | ORAL | Status: DC
  Administered 2023-06-27 – 2023-06-29 (×3): 25 mg via ORAL
  Filled 2023-06-26 (×3): qty 1

## 2023-06-26 MED ORDER — QUETIAPINE FUMARATE 25 MG PO TABS
25.0000 mg | ORAL_TABLET | Freq: Once | ORAL | Status: AC
  Administered 2023-06-26: 25 mg via ORAL
  Filled 2023-06-26: qty 1

## 2023-06-26 MED ORDER — CEFTRIAXONE SODIUM 1 G IJ SOLR
1.0000 g | INTRAMUSCULAR | Status: DC
  Administered 2023-06-27 – 2023-06-28 (×2): 1 g via INTRAVENOUS
  Filled 2023-06-26 (×2): qty 10

## 2023-06-26 MED ORDER — CIPROFLOXACIN-DEXAMETHASONE 0.3-0.1 % OT SUSP
4.0000 [drp] | Freq: Once | OTIC | Status: AC
  Administered 2023-06-26: 4 [drp] via OTIC
  Filled 2023-06-26: qty 7.5

## 2023-06-26 MED ORDER — ALPRAZOLAM 0.5 MG PO TABS
0.2500 mg | ORAL_TABLET | Freq: Once | ORAL | Status: AC
  Administered 2023-06-26: 0.25 mg via ORAL
  Filled 2023-06-26: qty 1

## 2023-06-26 MED ORDER — SODIUM CHLORIDE 0.9 % IV SOLN
1.0000 g | Freq: Once | INTRAVENOUS | Status: AC
  Administered 2023-06-26: 1 g via INTRAVENOUS
  Filled 2023-06-26: qty 10

## 2023-06-26 MED ORDER — SODIUM CHLORIDE 0.9 % IV BOLUS
500.0000 mL | Freq: Once | INTRAVENOUS | Status: AC
  Administered 2023-06-26: 500 mL via INTRAVENOUS

## 2023-06-26 NOTE — ED Notes (Signed)
Pt. Combative with care for EKG, IV, in/out cath. Pt. Hitting, kicking, biting, and spitting. 6 nurses in room for safety. All care preformed. Pt. Repositioned in bed, sleeve placed over iv.

## 2023-06-26 NOTE — ED Notes (Signed)
Pt. Attempting to pull out IV and mittens placed to bilateral hands.

## 2023-06-26 NOTE — ED Notes (Signed)
Pt. Attempting to pull iv out. Bilateral mittens placed on patient to try and prevent

## 2023-06-26 NOTE — ED Provider Notes (Signed)
0.0.0.0.0.0.0.0.0.0.0.0.0. [place] EMERGENCY DEPARTMENT AT [facility] Provider Note   CSN: [id number] Arrival date & time: 06/26/23  1440     History Chief Complaint  Patient presents with   Altered Mental Status    Wendy Watts is a 77 y.o. female.  Patient with past history significant for Alzheimer's disease, cardiac pacemaker, and episodes of acute renal failure secondary to UTI here with concerns of altered mental status.  Patient's daughter brings patient here for evaluation after becoming increasingly confused and agitated over the last 2 or 3 days.  Reports that she typically develops the symptoms in the setting of a UTI although cannot report any specific changes with urinary behaviors, odor changes, or other urinary symptoms.  Patient wears a depends diaper.  Has also had some diarrhea in the last day.  Patient unable to provide any history for herself to tell me if she is in any pain or any specific subjective symptoms that she has been noticing.  Was advised to come to the emergency department by primary care provider's office.   Altered Mental Status Presenting symptoms: confusion   Associated symptoms: agitation        Home Medications Prior to Admission medications   Medication Sig Start Date End Date Taking? Authorizing Provider  ALPRAZolam (XANAX) 0.25 MG tablet Take 0.25 mg by mouth 2 (two) times daily as needed. 08/02/22   [provider]  buPROPion (WELLBUTRIN XL) 150 MG 24 hr tablet Take 150 mg by mouth daily.    [provider]  busPIRone (BUSPAR) 10 MG tablet Take 1 tablet (10 mg total) by mouth 2 (two) times daily. May also take 1 tablet (10 mg total) as needed (for worsening agitation.). 03/08/23   Lomax, Amy, NP  donepezil (ARICEPT) 10 MG tablet TAKE 1 TABLET(10 MG) BY MOUTH AT BEDTIME 03/08/23   Lomax, Amy, NP  ELIQUIS 5 MG TABS tablet TAKE 1 TABLET(5 MG) BY MOUTH TWICE DAILY Patient not taking: Reported on 03/08/2023 07/21/22    Maeola Harman, MD  memantine (NAMENDA) 10 MG tablet Take 1 tablet (10 mg total) by mouth 2 (two) times daily. 03/08/23   Lomax, Amy, NP      Allergies    Allegra [fexofenadine hcl], Ativan [lorazepam], Azithromycin, Codeine, Doxycycline hyclate, Penicillins, Trazodone and nefazodone, and Zyrtec [cetirizine]    Review of Systems   Review of Systems  Psychiatric/Behavioral:  Positive for agitation and confusion.   All other systems reviewed and are negative.   Physical Exam Updated Vital Signs BP 107/89   Pulse 97   Temp 98 F (36.7 C)   Resp 18   Ht 5\' 6"  (1.676 m)   Wt 66.7 kg   SpO2 98%   BMI 23.73 kg/m  Physical Exam Vitals and nursing note reviewed.  Constitutional:      General: She is not in acute distress.    Appearance: Normal appearance. She is well-developed. She is not ill-appearing or toxic-appearing.  HENT:     Head: Normocephalic and atraumatic.     Right Ear: Tenderness present. There is no impacted cerumen. No mastoid tenderness. Tympanic membrane is not erythematous or bulging.     Left Ear: Tympanic membrane, ear canal and external ear normal. There is no impacted cerumen.     Ears:     Comments: Erythematous right ear canal with tenderness to the external ear when attempting to visual middle ear. No obvious TM bulging, retraction, or fluid drainage. Eyes:     General:  No scleral icterus.       Right eye: No discharge.        Left eye: No discharge.     Conjunctiva/sclera: Conjunctivae normal.  Cardiovascular:     Rate and Rhythm: Normal rate and regular rhythm.     Heart sounds: No murmur heard. Pulmonary:     Effort: Pulmonary effort is normal. No respiratory distress.     Breath sounds: Normal breath sounds.  Abdominal:     Palpations: Abdomen is soft.     Tenderness: There is no abdominal tenderness.  Musculoskeletal:        General: No swelling.     Cervical back: Neck supple.  Skin:    General: Skin is warm and dry.      Capillary Refill: Capillary refill takes less than 2 seconds.     Findings: No rash.  Neurological:     General: No focal deficit present.     Mental Status: She is alert. She is disoriented.     Comments: AOx1 to self. Unable to tell me any symptomatic concerns  Psychiatric:        Mood and Affect: Mood normal.     ED Results / Procedures / Treatments   Labs (all labs ordered are listed, but only abnormal results are displayed) Labs Reviewed  COMPREHENSIVE METABOLIC PANEL - Abnormal; Notable for the following components:      Result Value   Sodium 134 (*)    Glucose, Bld 51 (*)    Creatinine, Ser 1.09 (*)    AST 12 (*)    GFR, Estimated 52 (*)    All other components within normal limits  URINALYSIS, ROUTINE W REFLEX MICROSCOPIC - Abnormal; Notable for the following components:   APPearance HAZY (*)    Specific Gravity, Urine 1.035 (*)    Hgb urine dipstick TRACE (*)    Ketones, ur 40 (*)    Protein, ur 30 (*)    Nitrite POSITIVE (*)    Leukocytes,Ua LARGE (*)    Bacteria, UA MANY (*)    All other components within normal limits  URINE CULTURE  CBC WITH DIFFERENTIAL/PLATELET  CBG MONITORING, ED    EKG None  Radiology DG Chest Portable 1 View  Result Date: 06/26/2023 CLINICAL DATA:  Diarrhea and altered mental status. EXAM: PORTABLE CHEST 1 VIEW COMPARISON:  May 11, 2021 FINDINGS: There is stable single lead ventricular pacer positioning. The heart size and mediastinal contours are within normal limits. Mild atelectasis and/or infiltrate is seen within the lateral aspect of the left lung base. A small left pleural effusion is noted. No pneumothorax is identified. Multilevel degenerative changes are seen throughout the thoracic spine. IMPRESSION: 1. Mild left basilar atelectasis and/or infiltrate. 2. Small left pleural effusion. Electronically Signed   By: Aram Candela M.D.   On: 06/26/2023 20:39    Procedures Procedures   Medications Ordered in  ED Medications  ciprofloxacin-dexamethasone (CIPRODEX) 0.3-0.1 % OTIC (EAR) suspension 4 drop (has no administration in time range)  ALPRAZolam (XANAX) tablet 0.25 mg (0.25 mg Oral Given 06/26/23 1706)  QUEtiapine (SEROQUEL) tablet 25 mg (25 mg Oral Given 06/26/23 1852)  cefTRIAXone (ROCEPHIN) 1 g in sodium chloride 0.9 % 100 mL IVPB (1 g Intravenous New Bag/Given 06/26/23 2005)  sodium chloride 0.9 % bolus 500 mL (500 mLs Intravenous New Bag/Given 06/26/23 2000)    ED Course/ Medical Decision Making/ A&P Clinical Course as of 06/26/23 2108  Mon Jun 26, 2023  1914 Glucose(!): 51 [OZ]  Clinical Course User Index [OZ] Smitty Knudsen, PA-C                               Medical Decision Making Amount and/or Complexity of Data Reviewed Labs: ordered. Radiology: ordered.  Risk Prescription drug management.   This patient presents to the ED for concern of altered mental status.  Differential diagnosis includes sepsis, UTI, worsening cognitive function, stroke   Lab Tests:  I Ordered, and personally interpreted labs.  The pertinent results include: CBC unremarkable, CMP with slightly declining renal function provide 5-year ago with creatinine 1.09 GFR 52, urinalysis concerning for UTI as there is trace blood, positive nitrites, large leukocytes, and many bacteria noted.  Glucose on the CMP below at 51.  Repeat BG check shows glucose back to normal and is at 94   Imaging Studies ordered:  I ordered imaging studies including chest x-ray I independently visualized and interpreted imaging which showed mild left basilar atelectasis and/or infiltrate, small left pleural effusion I agree with the radiologist interpretation   Medicines ordered and prescription drug management:  I ordered medication including Xanax, Seroquel, fluids, Rocephin, Ciprodex for anxiety, agitation, mild AKI, with UTI, otitis externa Reevaluation of the patient after these medicines showed that the patient  stayed the same I have reviewed the patients home medicines and have made adjustments as needed   Problem List / ED Course:  Patient with past history significant for Alzheimer's disease, cardiac pacemaker, and episodes of acute renal failure secondary to UTI here with concerns of altered mental status.  Patient's daughter reports that she has been acting increasingly altered for the last 2 or 3 days with worsening agitation and poor insight.  At baseline, patient is only alert and oriented to self but poor time with friends or spatial reference.  Has had some dizziness over the last several days and was diagnosed with a case of otitis externa and was started on antibiotic drops.  Reports that they have been on these drops for several days with some improvement.  No recent fevers or chills although patient cannot recall these symptoms specifically.  Daughter reports that patient has had some diarrhea but no vomiting.  No obvious changes in urine color such as pain.  Hematuria, no changes in urine odor or output.  Has previously been admitted for UTI resulting in altered mental status.  Given history, CBC, CMP, CBG, and UA ordered for evaluation of symptoms.  Also added on chest x-ray and EKG.  Anticipate the patient will likely need inpatient admission given age and change in mental status.  No focal neurological deficits per patient's family report. Physical exam is unremarkable although patient is unable to tell me if she has had any areas of focal tenderness or pain.  No obvious pain response to the patient.  Will proceed with basic labs including CBC, CMP, urinalysis, CBG.  Patient not currently meeting SIRS criteria so low concern for sepsis at this point. CBC with no evidence of leukocytosis, CMP with only mild renal impairment noted at 1.09 for creatinine and GFR down to 52, urinalysis however is concerning for UTI.  Presence of UTI is likely the source of the worsening mental status.  Patient require  admission for continuation of IV antibiotics for UTI.  History of kidney stones cellulitis elicit pain on palpation of the abdomen or flanks.  No recent urine culture so will initiate Rocephin.  Chest x-ray imaging still  pending at this time and will wait this for consultation to hospitalist for admission for UTI and altered mental status. Ciprofex ordered for continued of home medication for otitis externa that patient has not finished. Spoke with Dr .Lazarus Salines, hospitalist, who will be admitting patient.  Final Clinical Impression(s) / ED Diagnoses Final diagnoses:  Urinary tract infection with hematuria, site unspecified  Altered mental status, unspecified altered mental status type    Rx / DC Orders ED Discharge Orders     None         Salomon Mast 06/26/23 2108    Anders Simmonds T, DO 06/27/23 2238

## 2023-06-26 NOTE — ED Triage Notes (Signed)
Pt BIB daughter who reports diarrhea, confusion, and agitation x3 days. Pt daughter states that this usually happens when she has a UTI. Pt currently on abx for ear infection. H/x alzheimer's.

## 2023-06-26 NOTE — ED Notes (Signed)
Pt. Screaming no he can't touch me. Pt. Attempting to hit and kick at staff and daughter. PA Jari Favre notified and orders placed.

## 2023-06-26 NOTE — Progress Notes (Signed)
Plan of Care Note for accepted transfer   Patient: Wendy Watts MRN: [id number]   DOA: 06/26/2023  Facility requesting transfer: [facility]  Requesting Provider: Maryanna Shape, PA; Anders Simmonds DO  Reason for transfer: UTI with AMS  Facility course:   20 F with hx dementia, hx DVT previously on [facility], pacemaker, who presented to [facility] with few days of confusion, agitation, similar to symptoms with UTI in past. VS initially tachycardic, improved with IVF. UA grossly consistent with infection, urine culture sent, started on Ceftriaxone. Otherwise with hypoglycemia corrected. OE on the R getting ciprodex. Significant agitation requiring multiple RN for lab draw. Coming in since caregiver unable to manage w her agitation.   Plan of care: The patient is accepted for admission to Med-surg  unit, at [facility] or [facility]   Author: Dolly Rias, MD 06/26/2023  Check [email or web address] for on-call coverage.  Nursing staff, Please call [facility] Admits & Consults System-Wide number on Amion as soon as patient's arrival, so appropriate admitting provider can evaluate the pt.

## 2023-06-27 DIAGNOSIS — E16A2 Hypoglycemia level 2: Secondary | ICD-10-CM | POA: Diagnosis not present

## 2023-06-27 DIAGNOSIS — F03918 Unspecified dementia, unspecified severity, with other behavioral disturbance: Secondary | ICD-10-CM

## 2023-06-27 DIAGNOSIS — H6091 Unspecified otitis externa, right ear: Secondary | ICD-10-CM | POA: Diagnosis not present

## 2023-06-27 DIAGNOSIS — Z7901 Long term (current) use of anticoagulants: Secondary | ICD-10-CM | POA: Diagnosis not present

## 2023-06-27 DIAGNOSIS — H60501 Unspecified acute noninfective otitis externa, right ear: Secondary | ICD-10-CM | POA: Diagnosis not present

## 2023-06-27 DIAGNOSIS — J9811 Atelectasis: Secondary | ICD-10-CM | POA: Diagnosis not present

## 2023-06-27 DIAGNOSIS — G9340 Encephalopathy, unspecified: Secondary | ICD-10-CM

## 2023-06-27 DIAGNOSIS — Z880 Allergy status to penicillin: Secondary | ICD-10-CM | POA: Diagnosis not present

## 2023-06-27 DIAGNOSIS — F02818 Dementia in other diseases classified elsewhere, unspecified severity, with other behavioral disturbance: Secondary | ICD-10-CM | POA: Diagnosis not present

## 2023-06-27 DIAGNOSIS — Z86718 Personal history of other venous thrombosis and embolism: Secondary | ICD-10-CM | POA: Diagnosis not present

## 2023-06-27 DIAGNOSIS — H6090 Unspecified otitis externa, unspecified ear: Secondary | ICD-10-CM | POA: Diagnosis present

## 2023-06-27 DIAGNOSIS — B961 Klebsiella pneumoniae [K. pneumoniae] as the cause of diseases classified elsewhere: Secondary | ICD-10-CM | POA: Diagnosis not present

## 2023-06-27 DIAGNOSIS — E7800 Pure hypercholesterolemia, unspecified: Secondary | ICD-10-CM | POA: Diagnosis not present

## 2023-06-27 DIAGNOSIS — Z96651 Presence of right artificial knee joint: Secondary | ICD-10-CM | POA: Diagnosis not present

## 2023-06-27 DIAGNOSIS — Z950 Presence of cardiac pacemaker: Secondary | ICD-10-CM | POA: Diagnosis not present

## 2023-06-27 DIAGNOSIS — G9341 Metabolic encephalopathy: Secondary | ICD-10-CM | POA: Diagnosis present

## 2023-06-27 DIAGNOSIS — Z888 Allergy status to other drugs, medicaments and biological substances status: Secondary | ICD-10-CM | POA: Diagnosis not present

## 2023-06-27 DIAGNOSIS — N3000 Acute cystitis without hematuria: Secondary | ICD-10-CM

## 2023-06-27 DIAGNOSIS — N179 Acute kidney failure, unspecified: Secondary | ICD-10-CM | POA: Diagnosis not present

## 2023-06-27 DIAGNOSIS — Z91041 Radiographic dye allergy status: Secondary | ICD-10-CM | POA: Diagnosis not present

## 2023-06-27 DIAGNOSIS — G309 Alzheimer's disease, unspecified: Secondary | ICD-10-CM | POA: Diagnosis not present

## 2023-06-27 DIAGNOSIS — F05 Delirium due to known physiological condition: Secondary | ICD-10-CM | POA: Diagnosis not present

## 2023-06-27 DIAGNOSIS — N390 Urinary tract infection, site not specified: Secondary | ICD-10-CM | POA: Diagnosis not present

## 2023-06-27 DIAGNOSIS — Z79899 Other long term (current) drug therapy: Secondary | ICD-10-CM | POA: Diagnosis not present

## 2023-06-27 DIAGNOSIS — Z881 Allergy status to other antibiotic agents status: Secondary | ICD-10-CM | POA: Diagnosis not present

## 2023-06-27 DIAGNOSIS — Z90710 Acquired absence of both cervix and uterus: Secondary | ICD-10-CM | POA: Diagnosis not present

## 2023-06-27 DIAGNOSIS — E162 Hypoglycemia, unspecified: Secondary | ICD-10-CM

## 2023-06-27 DIAGNOSIS — Z885 Allergy status to narcotic agent status: Secondary | ICD-10-CM | POA: Diagnosis not present

## 2023-06-27 DIAGNOSIS — Z515 Encounter for palliative care: Secondary | ICD-10-CM | POA: Diagnosis not present

## 2023-06-27 DIAGNOSIS — Z882 Allergy status to sulfonamides status: Secondary | ICD-10-CM | POA: Diagnosis not present

## 2023-06-27 MED ORDER — ONDANSETRON HCL 4 MG/2ML IJ SOLN: 4.0000 mg | Freq: Four times a day (QID) | INTRAMUSCULAR | Status: DC | PRN

## 2023-06-27 MED ORDER — ACETAMINOPHEN 650 MG RE SUPP: 650.0000 mg | Freq: Four times a day (QID) | RECTAL | Status: DC | PRN

## 2023-06-27 MED ORDER — ACETAMINOPHEN 325 MG PO TABS: 650.0000 mg | ORAL_TABLET | Freq: Four times a day (QID) | ORAL | Status: DC | PRN

## 2023-06-27 MED ORDER — ONDANSETRON HCL 4 MG PO TABS: 4.0000 mg | ORAL_TABLET | Freq: Four times a day (QID) | ORAL | Status: DC | PRN

## 2023-06-27 MED ORDER — CIPROFLOXACIN-DEXAMETHASONE 0.3-0.1 % OT SUSP
4.0000 [drp] | Freq: Two times a day (BID) | OTIC | Status: DC
  Administered 2023-06-29 (×2): 4 [drp] via OTIC
  Filled 2023-06-27: qty 0.2

## 2023-06-27 MED ORDER — ALPRAZOLAM 0.25 MG PO TABS
0.2500 mg | ORAL_TABLET | Freq: Two times a day (BID) | ORAL | Status: DC | PRN
  Administered 2023-06-27 – 2023-06-28 (×2): 0.25 mg via ORAL
  Filled 2023-06-27 (×2): qty 1

## 2023-06-27 MED ORDER — BUPROPION HCL ER (XL) 150 MG PO TB24
150.0000 mg | ORAL_TABLET | Freq: Every day | ORAL | Status: DC
  Administered 2023-06-27 – 2023-06-30 (×4): 150 mg via ORAL
  Filled 2023-06-27 (×4): qty 1

## 2023-06-27 MED ORDER — BUSPIRONE HCL 10 MG PO TABS: 10.0000 mg | ORAL_TABLET | ORAL | Status: DC | PRN

## 2023-06-27 MED ORDER — DONEPEZIL HCL 10 MG PO TABS
10.0000 mg | ORAL_TABLET | Freq: Every day | ORAL | Status: DC
  Administered 2023-06-27 – 2023-06-29 (×3): 10 mg via ORAL
  Filled 2023-06-27 (×3): qty 1

## 2023-06-27 MED ORDER — ENOXAPARIN SODIUM 40 MG/0.4ML IJ SOSY
40.0000 mg | PREFILLED_SYRINGE | INTRAMUSCULAR | Status: DC
Start: 2023-06-27 — End: 2023-06-30
  Administered 2023-06-27 – 2023-06-29 (×3): 40 mg via SUBCUTANEOUS
  Filled 2023-06-27 (×4): qty 0.4

## 2023-06-27 MED ORDER — BUSPIRONE HCL 10 MG PO TABS
10.0000 mg | ORAL_TABLET | Freq: Two times a day (BID) | ORAL | Status: DC
  Administered 2023-06-27 – 2023-06-30 (×7): 10 mg via ORAL
  Filled 2023-06-27 (×7): qty 1

## 2023-06-27 MED ORDER — MEMANTINE HCL 10 MG PO TABS
10.0000 mg | ORAL_TABLET | Freq: Two times a day (BID) | ORAL | Status: DC
  Administered 2023-06-27 – 2023-06-30 (×7): 10 mg via ORAL
  Filled 2023-06-27 (×7): qty 1

## 2023-06-27 NOTE — TOC Initial Note (Signed)
Transition of Care [facility]) - Initial/Assessment Note    Patient Details  Name: Wendy Watts MRN: [id number] Date of Birth: Jan 03, 1946  Transition of [facility]) CM/SW Contact:    Durenda Guthrie, RN Phone Number: 06/27/2023, 11:24 AM  Clinical Narrative:                 Case manager spoke with patient's daughter, Dimas Chyle [phone]. She states patient may not need PT once UTI and ear infection clears, wants to wait before addressing that. Patient has a walker but doesn't use, per daughter she never moves without someone assisting her. She declines wheelchair, says patient will not use it, plans to spesk with Dr and therapist and will make further decision. TOC will continue to monitor.     Barriers to Discharge: Continued Medical Work up   Patient Goals and CMS Choice            Expected Discharge Plan and Services       Living arrangements for the past 2 months: Single Family Home                                      Prior Living Arrangements/Services Living arrangements for the past 2 months: Single Family Home Lives with:: Adult Children                   Activities of Daily Living   ADL Screening (condition at time of admission) Independently performs ADLs?: No Does the patient have a NEW difficulty with bathing/dressing/toileting/self-feeding that is expected to last >3 days?: Yes (Initiates electronic notice to provider for possible OT consult) Does the patient have a NEW difficulty with getting in/out of bed, walking, or climbing stairs that is expected to last >3 days?: Yes (Initiates electronic notice to provider for possible PT consult) Does the patient have a NEW difficulty with communication that is expected to last >3 days?: No Is the patient deaf or have difficulty hearing?: No Does the patient have difficulty seeing, even when wearing glasses/contacts?: No Does the patient have difficulty concentrating, remembering, or making  decisions?: Yes  Permission Sought/Granted                  Emotional Assessment              Admission diagnosis:  Urinary tract infection [N39.0] Urinary tract infection with hematuria, site unspecified [N39.0, R31.9] Altered mental status, unspecified altered mental status type [R41.82] Acute metabolic encephalopathy [G93.41] Patient Active Problem List   Diagnosis Date Noted   Acute metabolic encephalopathy 06/27/2023   Otitis externa 06/27/2023   Nondiabetic hypoglycemia 06/27/2023   Urinary tract infection 06/26/2023   Cardiac pacemaker in situ    Malnutrition of moderate degree 04/28/2021   Dementia with behavioral disturbance (HCC) 05/08/2019   PCP:  Daisy Floro, MD Pharmacy:   [id number] - [place], [place] - [address] AT [facility] OF [address] & [address] [place] [address] Phone: [phone] Fax: [phone]     Social Determinants of Health (SDOH) Social History: SDOH Screenings   Food Insecurity: No Food Insecurity (06/27/2023)  Housing: Low Risk  (06/27/2023)  Transportation Needs: No Transportation Needs (06/27/2023)  Utilities: Not At Risk (06/27/2023)  Tobacco Use: Low Risk  (06/26/2023)   SDOH Interventions:     Readmission Risk Interventions     No  data to display

## 2023-06-27 NOTE — Assessment & Plan Note (Signed)
06-27-2023 CBG 51 yesterday. CBG has improved.

## 2023-06-27 NOTE — Assessment & Plan Note (Signed)
06-27-2023 stable. Chronic.

## 2023-06-27 NOTE — Progress Notes (Signed)
Patient appears to be resting, has calmed down. Daughter at bedside will leave shortly.

## 2023-06-27 NOTE — Evaluation (Signed)
Occupational Therapy Evaluation Patient Details Name: Wendy Watts MRN: [id number] DOB: 1945-12-09 Today's Date: 06/27/2023   History of Present Illness Pt is a 77 y/o F presenting to ED on 11/11 with agitation/AMS. UA concerning for UTI. Pt also with recent R ear infection. PMH includes advanced dementia, HLD, PPM   Clinical Impression   Pt has assist at baseline with ADLs, ambulates without AD in home for short distances, lives with family who provides 24/7 assist. Pt currently with little to no command follow during session needing mod-max A for ADLs, min-mod +2 for bed mobility and mod +2 for standing with 2 person HHA. Needs cues repeated throughout session 2/2 decr cognition. Pt presenting with impairments listed below, will follow acutely. Recommend HHOT at d/c.        If plan is discharge home, recommend the following: Two people to help with walking and/or transfers;A lot of help with bathing/dressing/bathroom;Assistance with cooking/housework;Direct supervision/assist for medications management;Direct supervision/assist for financial management;Assist for transportation;Help with stairs or ramp for entrance;Supervision due to cognitive status;Assistance with feeding    Functional Status Assessment  Patient has had a recent decline in their functional status and demonstrates the ability to make significant improvements in function in a reasonable and predictable amount of time.  Equipment Recommendations  None recommended by OT    Recommendations for Other Services PT consult     Precautions / Restrictions Precautions Precautions: Fall Precaution Comments: R hand mitt Restrictions Weight Bearing Restrictions: No      Mobility Bed Mobility Overal bed mobility: Needs Assistance Bed Mobility: Supine to Sit, Sit to Supine     Supine to sit: Mod assist, +2 for physical assistance Sit to supine: Min assist        Transfers Overall transfer level: Needs  assistance Equipment used: 2 person hand held assist Transfers: Sit to/from Stand Sit to Stand: Mod assist, +2 physical assistance, Max assist                  Balance Overall balance assessment: Needs assistance Sitting-balance support: Feet supported Sitting balance-Leahy Scale: Fair     Standing balance support: During functional activity Standing balance-Leahy Scale: Poor Standing balance comment: reliance on external support                           ADL either performed or assessed with clinical judgement   ADL Overall ADL's : Needs assistance/impaired Eating/Feeding: Moderate assistance   Grooming: Moderate assistance   Upper Body Bathing: Maximal assistance   Lower Body Bathing: Maximal assistance   Upper Body Dressing : Moderate assistance Upper Body Dressing Details (indicate cue type and reason): hand over hand assist to bring hands through sleeves     Toilet Transfer: Maximal assistance   Toileting- Clothing Manipulation and Hygiene: Maximal assistance       Functional mobility during ADLs: Moderate assistance;+2 for physical assistance       Vision   Additional Comments: unable to assess this session     Perception Perception: Not tested       Praxis Praxis: Not tested       Pertinent Vitals/Pain Pain Assessment Pain Assessment: No/denies pain     Extremity/Trunk Assessment Upper Extremity Assessment Upper Extremity Assessment: Generalized weakness   Lower Extremity Assessment Lower Extremity Assessment: Defer to PT evaluation   Cervical / Trunk Assessment Cervical / Trunk Assessment: Normal   Communication Communication Communication: Hearing impairment   Cognition Arousal: Alert Behavior  During Therapy: Restless Overall Cognitive Status: History of cognitive impairments - at baseline                                 General Comments: hx of advanced dementia, no command follow during session, pt  frequently asking that instructions be repeated     General Comments  VSS, noted ear drainage from R ear, PT notified MD    Exercises     Shoulder Instructions      Home Living Family/patient expects to be discharged to:: Private residence Living Arrangements: Spouse/significant other;Children Available Help at Discharge: Family;Available 24 hours/day Type of Home: House Home Access: Stairs to enter [employer] of Steps: 2 Entrance Stairs-Rails: None Home Layout: One level     Bathroom Shower/Tub: Chief Strategy Officer: Standard     Home Equipment: Agricultural consultant (2 wheels);Shower seat;Wheelchair - manual ([employer])   Additional Comments: home set up info taken from chart review/prior admission      Prior Functioning/Environment Prior Level of Function : Needs assist               ADLs Comments: per chart review wears depends for toileting        OT Problem List: Decreased strength;Decreased range of motion;Decreased activity tolerance      OT Treatment/Interventions: Self-care/ADL training;Therapeutic exercise;Energy conservation;DME and/or AE instruction;Therapeutic activities;Patient/family education;Balance training;Visual/perceptual remediation/compensation;Cognitive remediation/compensation    OT Goals(Current goals can be found in the care plan section) Acute Rehab OT Goals Patient Stated Goal: pt unable to state OT Goal Formulation: Patient unable to participate in goal setting Time For Goal Achievement: 07/11/23 Potential to Achieve Goals: Good  OT Frequency: Min 1X/week    Co-evaluation PT/OT/SLP Co-Evaluation/Treatment: Yes Reason for Co-Treatment: Complexity of the patient's impairments (multi-system involvement);Necessary to address cognition/behavior during functional activity;For patient/therapist safety;To address functional/ADL transfers PT goals addressed during session: Mobility/safety with mobility;Balance OT  goals addressed during session: Strengthening/ROM;ADL's and self-care      AM-PAC OT "6 Clicks" Daily Activity     Outcome Measure Help from another person eating meals?: A Lot Help from another person taking care of personal grooming?: A Lot Help from another person toileting, which includes using toliet, bedpan, or urinal?: Total Help from another person bathing (including washing, rinsing, drying)?: A Lot Help from another person to put on and taking off regular upper body clothing?: A Lot Help from another person to put on and taking off regular lower body clothing?: A Lot 6 Click Score: 11   End of Session Nurse Communication: Mobility status  Activity Tolerance: Patient tolerated treatment well Patient left: in bed;with call bell/phone within reach;with bed alarm set;with restraints reapplied (R hand mitt)  OT Visit Diagnosis: Unsteadiness on feet (R26.81);Other abnormalities of gait and mobility (R26.89);Muscle weakness (generalized) (M62.81)                Time: 3664-4034 OT Time Calculation (min): 16 min Charges:  OT General Charges $OT Visit: 1 Visit OT Evaluation $OT Eval Low Complexity: 1 Low  Carver Fila, OTD, OTR/L SecureChat Preferred Acute Rehab [phone]   Carver Fila Koonce 06/27/2023, 9:38 AM

## 2023-06-27 NOTE — Progress Notes (Signed)
Patient loss Iv access on previous shift, 1900 ABX not given. IV team was consulted. IV nurse at bedside pt initially agreed; however, pt became agitated and aggressive . Prn xanax was given, pt now calm and cooperative. IV consulted again

## 2023-06-27 NOTE — Assessment & Plan Note (Addendum)
On admission, Cont namenda and aricept.  06-27-2023 continue namenda/aricept. Moderate to severe per dtr [name]

## 2023-06-27 NOTE — Assessment & Plan Note (Addendum)
On Admission, UA c/w UTI.Pt with agitation which is her typical UTI symptom per daughter. Empiric rocephin, UCx pending, No SIRS  06-27-2023 awaiting urine cx. Continue IV rocephin

## 2023-06-27 NOTE — Progress Notes (Addendum)
PROGRESS NOTE    Wendy Watts  WUJ:[id number] DOB: March 18, 1946 DOA: 06/26/2023 PCP: Daisy Floro, MD  Subjective: Pt seen and examined. Pt was sleeping and was arousable   Hospital Course: HPI:  77 y.o. female with medical history significant of advanced dementia, HLD, PPM.   Pt in to ED at [facility] with AMS / agitation.  Pt increasingly confused and agitated at home for past 2-3 days per daughter.  Typically develops these symptoms in setting of UTI, though daughter doesn't report any specific changes to urinary behaviors, odors, etc.   Pt wears depends.  Has had some diarrhea in past day.   Pt oriented to self only at baseline.   In ED: Pt without SIRS, UA is indeed c/w UTI on ED workup: Large LE, positive, nitrites, many bacteria, and > 50 WBC   Pt started on rocephin.   CXR with question of R basilar atelectasis vs infiltrate: though pt satting 97% on RA, no reported cough, congestion etc.   Only other finding of significance was some erythema of R ear canal (otitis externa), though TM looked okay.  Significant Events: Admitted 06/26/2023 for acute metabolic encephalopathy, UTI, right otitis externa   Significant Labs: Admission WBC 10.3, UA positive for nitrites, LE, many bacteria  Significant Imaging Studies: Admission CXR left basilar atelectasis  Antibiotic Therapy: Anti-infectives (From admission, onward)    Start     Dose/Rate Route Frequency Ordered Stop   06/27/23 1900  cefTRIAXone (ROCEPHIN) 1 g in sodium chloride 0.9 % 100 mL IVPB        1 g 200 mL/hr over 30 Minutes Intravenous Every 24 hours 06/26/23 2247     06/26/23 1945  cefTRIAXone (ROCEPHIN) 1 g in sodium chloride 0.9 % 100 mL IVPB        1 g 200 mL/hr over 30 Minutes Intravenous  Once 06/26/23 1934 06/26/23 2136       Procedures:   Consultants:     Assessment and Plan: * Acute metabolic encephalopathy On admission, Pt most likely has delirium secondary to UTI. DDx includes:1) PNA:  though no respiratory symptoms, no cough, normal O2 sat, 2) otitis externa: on rocephin for UTI anyhow 3) other causes..  Treat UTI as below. Will hold off on CNS imaging for the moment.  06-27-2023 most likely due to UTI. Difficult to tell due to concomitant dementia. Dtr [name] says that pt's dementia is moderate to severe. Pt normally walks without a walker but did fall at home last week.  Urinary tract infection On Admission, UA c/w UTI.Pt with agitation which is her typical UTI symptom per daughter. Empiric rocephin, UCx pending, No SIRS  06-27-2023 awaiting urine cx. Continue IV rocephin  Otitis externa On admission, Continue ciprodex. On rocephin for UTI as well now.  06-27-2023 continue with ciprodex otic drops  Nondiabetic hypoglycemia 06-27-2023 CBG 51 yesterday. CBG has improved.  Cardiac pacemaker in situ 06-27-2023 stable. Chronic.  Dementia with behavioral disturbance (HCC) On admission, Cont namenda and aricept.  06-27-2023 continue namenda/aricept. Moderate to severe per dtr [name]   DVT prophylaxis: enoxaparin (LOVENOX) injection 40 mg Start: 06/27/23 1000     Code Status: Full Code Family Communication: called pt's dtr [name]. She identifies herself as co-HCPOA. Pt has had dementia for several years. Rated moderate/severe. Per dtr, last MMSE she scored 15. Pt is ambulatory without walker but did fall at home last week. Pt lives with her husband and pt's son. Dtr lives in [place]. Disposition Plan: return home Reason  for continuing need for hospitalization: remains on IV Abx.  Objective: Vitals:   06/26/23 1831 06/26/23 2137 06/26/23 2213 06/27/23 0741  BP: 107/89 (!) 167/56 (!) 138/90 121/77  Pulse:  77 84 86  Resp: 18 16  17   Temp:   97.8 F (36.6 C) 99 F (37.2 C)  TempSrc:   Axillary Oral  SpO2: 98% 97%  98%  Weight:   67.5 kg   Height:   5\' 6"  (1.676 m)     Intake/Output Summary (Last 24 hours) at 06/27/2023 1044 Last data filed at 06/26/2023 2136 Gross  per 24 hour  Intake 600.61 ml  Output --  Net 600.61 ml   Filed Weights   06/26/23 1504 06/26/23 2213  Weight: 66.7 kg 67.5 kg    Examination:  Physical Exam Vitals and nursing note reviewed.  Constitutional:      Comments: Sleeping but arousable  HENT:     Head: Normocephalic and atraumatic.     Ears:     Comments: Right external auditory canal was swollen Cardiovascular:     Rate and Rhythm: Normal rate.  Pulmonary:     Effort: Pulmonary effort is normal.     Breath sounds: Normal breath sounds.  Abdominal:     General: Bowel sounds are normal. There is no distension.     Tenderness: There is abdominal tenderness.     Comments: Abd mildly tender but no rebound or guarding  Musculoskeletal:     Right lower leg: No edema.     Left lower leg: No edema.  Skin:    General: Skin is warm and dry.     Capillary Refill: Capillary refill takes less than 2 seconds.  Neurological:     Mental Status: She is disoriented.     Data Reviewed: I have personally reviewed following labs and imaging studies  CBC: Recent Labs  Lab 06/26/23 1831  WBC 10.3  NEUTROABS 7.6  HGB 13.7  HCT 40.3  MCV 90.8  PLT 311   Basic Metabolic Panel: Recent Labs  Lab 06/26/23 1831  NA 134*  K 5.0  CL 100  CO2 25  GLUCOSE 51*  BUN 12  CREATININE 1.09*  CALCIUM 9.9   GFR: Estimated Creatinine Clearance: 40.5 mL/min (A) (by C-G formula based on SCr of 1.09 mg/dL (H)). Liver Function Tests: Recent Labs  Lab 06/26/23 1831  AST 12*  ALT 6  ALKPHOS 85  BILITOT 0.6  PROT 7.5  ALBUMIN 3.8   CBG: Recent Labs  Lab 06/26/23 1947  GLUCAP 94    Radiology Studies: DG Chest Portable 1 View  Result Date: 06/26/2023 CLINICAL DATA:  Diarrhea and altered mental status. EXAM: PORTABLE CHEST 1 VIEW COMPARISON:  May 11, 2021 FINDINGS: There is stable single lead ventricular pacer positioning. The heart size and mediastinal contours are within normal limits. Mild atelectasis and/or  infiltrate is seen within the lateral aspect of the left lung base. A small left pleural effusion is noted. No pneumothorax is identified. Multilevel degenerative changes are seen throughout the thoracic spine. IMPRESSION: 1. Mild left basilar atelectasis and/or infiltrate. 2. Small left pleural effusion. Electronically Signed   By: Aram Candela M.D.   On: 06/26/2023 20:39    Scheduled Meds:  buPROPion  150 mg Oral Daily   busPIRone  10 mg Oral BID   ciprofloxacin-dexamethasone  4 drop Right EAR BID   donepezil  10 mg Oral QHS   enoxaparin (LOVENOX) injection  40 mg Subcutaneous Q24H   memantine  10 mg Oral BID   QUEtiapine  25 mg Oral QHS   Continuous Infusions:  cefTRIAXone (ROCEPHIN)  IV       LOS: 0 days   Time spent: 40 minutes  Carollee Herter, DO  [facility]  06/27/2023, 10:44 AM

## 2023-06-27 NOTE — Progress Notes (Addendum)
Patient arrived to unit via stretcher transport, pt is noted to be verbally abusive to transport team. once pt was placed in bed, daughter arrived and staff attempted to obtain vital signs, skin assessment and pt care.  Patient then became combative punching and kicking at staff and daughter, Daughter attempted to calm patient pt was verbally aggressive to her. Unable to redirect patient. Patient answers to her name but unable to answer any other orientation question.Patient only alert to self at baseline per daughter,

## 2023-06-27 NOTE — Evaluation (Signed)
Physical Therapy Evaluation Patient Details Name: Wendy Watts MRN: [id number] DOB: 28-May-1946 Today's Date: 06/27/2023  History of Present Illness  Pt is a 77 y/o F presenting to ED on 11/11 with agitation/AMS. UA concerning for UTI. Pt also with recent R ear infection. PMH includes advanced dementia, HLD, PPM  Clinical Impression  Pt presents to PT with deficits in cognition, communication, balance, functional mobility, strength, power. Pt does not consistently follow commands and is often resistant to mobility attempts during session. Pt requires 2 person assistance to briefly stand at bedside during session. PT anticipates if pt becomes more agreeable to mobilizing that she will likely demonstrate reduced assistance needs, as she demonstrates fair strength in trunk and extremities with volitional movements during session. Pt appears to have good caregiver support at home based on report from pt's daughter via phone call. PT is hopeful that the pt's cognition will return closer to baseline with treatment of current infections, and that the pt will be able to discharge home with HHPT.        If plan is discharge home, recommend the following: A lot of help with walking and/or transfers;A lot of help with bathing/dressing/bathroom;Assistance with cooking/housework;Assistance with feeding;Direct supervision/assist for medications management;Direct supervision/assist for financial management;Assist for transportation;Help with stairs or ramp for entrance;Supervision due to cognitive status   Can travel by private vehicle        Equipment Recommendations Wheelchair (measurements PT) (need to confirm if pt owns a wheelchair)  Recommendations for Other Services       Functional Status Assessment Patient has had a recent decline in their functional status and demonstrates the ability to make significant improvements in function in a reasonable and predictable amount of time.     Precautions /  Restrictions Precautions Precautions: Fall Precaution Comments: R hand mitt Restrictions Weight Bearing Restrictions: No      Mobility  Bed Mobility Overal bed mobility: Needs Assistance Bed Mobility: Supine to Sit, Sit to Supine     Supine to sit: Mod assist, +2 for physical assistance Sit to supine: Min assist        Transfers Overall transfer level: Needs assistance Equipment used: 2 person hand held assist Transfers: Sit to/from Stand Sit to Stand: Mod assist, +2 physical assistance           General transfer comment: pt is resistant to transfer attempts, does not follow commands well. Becomes mildly agitated with attempts at tactile cueing to initiate transfer    Ambulation/Gait                  Stairs            Wheelchair Mobility     Tilt Bed    Modified Rankin (Stroke Patients Only)       Balance Overall balance assessment: Needs assistance Sitting-balance support: No upper extremity supported, Feet supported Sitting balance-Leahy Scale: Fair     Standing balance support: Bilateral upper extremity supported, Reliant on assistive device for balance Standing balance-Leahy Scale: Poor                               Pertinent Vitals/Pain Pain Assessment Pain Assessment: PAINAD Breathing: normal Negative Vocalization: occasional moan/groan, low speech, negative/disapproving quality Facial Expression: sad, frightened, frown Body Language: relaxed Consolability: distracted or reassured by voice/touch PAINAD Score: 3    Home Living Family/patient expects to be discharged to:: Private residence Living Arrangements: Spouse/significant other;Children Available  Help at Discharge: Family;Available 24 hours/day Type of Home: House Home Access: Stairs to enter Entrance Stairs-Rails: None Entrance Stairs-Number of Steps: 2   Home Layout: One level Home Equipment: Agricultural consultant (2 wheels);Shower seat;Toilet  riser Additional Comments: daughter provides history, past admission mentioned a wheelchair however this was not confirmed by daughter via phone. PT will follow up on this next session    Prior Function Prior Level of Function : Needs assist  Cognitive Assist : Mobility (cognitive);ADLs (cognitive)           Mobility Comments: ambulates without UE support for short household distances, assistance for any mobility out of the home ADLs Comments: pt requires assistance for all ADL and IADLs     Extremity/Trunk Assessment   Upper Extremity Assessment Upper Extremity Assessment: Defer to OT evaluation    Lower Extremity Assessment Lower Extremity Assessment: Generalized weakness;Difficult to assess due to impaired cognition    Cervical / Trunk Assessment Cervical / Trunk Assessment: Kyphotic  Communication   Communication Communication: Hearing impairment Cueing Techniques: Verbal cues;Tactile cues;Gestural cues;Visual cues  Cognition Arousal: Alert Behavior During Therapy: Agitated Overall Cognitive Status: History of cognitive impairments - at baseline                                 General Comments: advanced dementia, no caregivers present to compare current cognition to baseline. Pt does not follow commands consistently, often resistant to attempts at mobilizing        General Comments General comments (skin integrity, edema, etc.): VSS on RA, pt with drainage from R ear    Exercises     Assessment/Plan    PT Assessment Patient needs continued PT services  PT Problem List Decreased strength;Decreased activity tolerance;Decreased balance;Decreased mobility;Decreased cognition;Decreased knowledge of use of DME;Decreased safety awareness;Decreased knowledge of precautions       PT Treatment Interventions DME instruction;Gait training;Stair training;Functional mobility training;Therapeutic activities;Therapeutic exercise;Balance training;Neuromuscular  re-education;Cognitive remediation;Patient/family education;Wheelchair mobility training    PT Goals (Current goals can be found in the Care Plan section)  Acute Rehab PT Goals Patient Stated Goal: to return home, improve balance PT Goal Formulation: With family Time For Goal Achievement: 07/11/23 Potential to Achieve Goals: Fair    Frequency Min 1X/week     Co-evaluation PT/OT/SLP Co-Evaluation/Treatment: Yes Reason for Co-Treatment: Complexity of the patient's impairments (multi-system involvement);Necessary to address cognition/behavior during functional activity;For patient/therapist safety;To address functional/ADL transfers PT goals addressed during session: Mobility/safety with mobility;Balance OT goals addressed during session: Strengthening/ROM;ADL's and self-care       AM-PAC PT "6 Clicks" Mobility  Outcome Measure Help needed turning from your back to your side while in a flat bed without using bedrails?: A Lot Help needed moving from lying on your back to sitting on the side of a flat bed without using bedrails?: A Lot Help needed moving to and from a bed to a chair (including a wheelchair)?: Total Help needed standing up from a chair using your arms (e.g., wheelchair or bedside chair)?: A Lot Help needed to walk in hospital room?: Total Help needed climbing 3-5 steps with a railing? : Total 6 Click Score: 9    End of Session   Activity Tolerance: Treatment limited secondary to agitation Patient left: in bed;with call bell/phone within reach;with bed alarm set;with restraints reapplied (mitt applied to R hand) Nurse Communication: Mobility status PT Visit Diagnosis: Other abnormalities of gait and mobility (R26.89);Muscle weakness (generalized) (M62.81)  Time: 0900-0915 PT Time Calculation (min) (ACUTE ONLY): 15 min   Charges:   PT Evaluation $PT Eval Low Complexity: 1 Low   PT General Charges $$ ACUTE PT VISIT: 1 Visit         Arlyss Gandy, PT,  DPT Acute Rehabilitation Office [phone]   Arlyss Gandy 06/27/2023, 9:55 AM

## 2023-06-27 NOTE — Assessment & Plan Note (Addendum)
On admission, Pt most likely has delirium secondary to UTI. DDx includes:1) PNA: though no respiratory symptoms, no cough, normal O2 sat, 2) otitis externa: on rocephin for UTI anyhow 3) other causes..  Treat UTI as below. Will hold off on CNS imaging for the moment.  06-27-2023 most likely due to UTI. Difficult to tell due to concomitant dementia. Dtr [name] says that pt's dementia is moderate to severe. Pt normally walks without a walker but did fall at home last week.

## 2023-06-27 NOTE — Hospital Course (Signed)
HPI:  77 y.o. female with medical history significant of advanced dementia, HLD, PPM.   Pt in to ED at [facility] with AMS / agitation.  Pt increasingly confused and agitated at home for past 2-3 days per daughter.  Typically develops these symptoms in setting of UTI, though daughter doesn't report any specific changes to urinary behaviors, odors, etc.   Pt wears depends.  Has had some diarrhea in past day.   Pt oriented to self only at baseline.   In ED: Pt without SIRS, UA is indeed c/w UTI on ED workup: Large LE, positive, nitrites, many bacteria, and > 50 WBC   Pt started on rocephin.   CXR with question of R basilar atelectasis vs infiltrate: though pt satting 97% on RA, no reported cough, congestion etc.   Only other finding of significance was some erythema of R ear canal (otitis externa), though TM looked okay.  Significant Events: Admitted 06/26/2023 for acute metabolic encephalopathy, UTI, right otitis externa   Significant Labs: Admission WBC 10.3, UA positive for nitrites, LE, many bacteria  Significant Imaging Studies: Admission CXR left basilar atelectasis  Antibiotic Therapy: Anti-infectives (From admission, onward)    Start     Dose/Rate Route Frequency Ordered Stop   06/27/23 1900  cefTRIAXone (ROCEPHIN) 1 g in sodium chloride 0.9 % 100 mL IVPB        1 g 200 mL/hr over 30 Minutes Intravenous Every 24 hours 06/26/23 2247     06/26/23 1945  cefTRIAXone (ROCEPHIN) 1 g in sodium chloride 0.9 % 100 mL IVPB        1 g 200 mL/hr over 30 Minutes Intravenous  Once 06/26/23 1934 06/26/23 2136       Procedures:   Consultants:

## 2023-06-27 NOTE — Assessment & Plan Note (Addendum)
On admission, Continue ciprodex. On rocephin for UTI as well now.  06-27-2023 continue with ciprodex otic drops

## 2023-06-27 NOTE — Subjective & Objective (Addendum)
Pt seen and examined. Pt was sleeping and was arousable

## 2023-06-27 NOTE — Progress Notes (Signed)
PIV consult received. Arrived to pt room pt refused to have VAST assess for PIV. Patient nurse called to BS. Patient continued to refuse, becoming more aggressive. Clint Lipps, RN to contact MD. Tomasita Morrow, RN VAST

## 2023-06-27 NOTE — H&P (Signed)
History and Physical    Patient: Wendy Watts WGN:[id number] DOB: [birth year] DOA: 06/26/2023 DOS: the patient was seen and examined on 06/27/2023 PCP: Daisy Floro, MD  Patient coming from: Home  Chief Complaint:  Chief Complaint  Patient presents with   Altered Mental Status   HPI: Wendy Watts is a 77 y.o. female with medical history significant of advanced dementia, HLD, PPM.  Pt in to ED at [facility] with AMS / agitation.  Pt increasingly confused and agitated at home for past 2-3 days per daughter.  Typically develops these symptoms in setting of UTI, though daughter doesn't report any specific changes to urinary behaviors, odors, etc.  Pt wears depends.  Has had some diarrhea in past day.  Pt oriented to self only at baseline.  In ED: Pt without SIRS, UA is indeed c/w UTI on ED workup: Large LE, positive, nitrites, many bacteria, and > 50 WBC  Pt started on rocephin.  CXR with question of R basilar atelectasis vs infiltrate: though pt satting 97% on RA, no reported cough, congestion etc.  Only other finding of significance was some erythema of R ear canal (otitis externa), though TM looked okay.   Review of Systems: unable to review all systems due to the inability of the patient to answer questions. Past Medical History:  Diagnosis Date   Anxiety    Dementia (HCC)    High cholesterol    Vertigo    Past Surgical History:  Procedure Laterality Date   HYSTERECTOMY     INTRAMEDULLARY (IM) NAIL INTERTROCHANTERIC Right 03/28/2021   Procedure: INTRAMEDULLARY (IM) NAIL INTERTROCHANTRIC;  Surgeon: Cammy Copa, MD;  Location: [facility] ORS;  Service: Orthopedics;  Laterality: Right;   PACEMAKER IMPLANT N/A 05/10/2021   Procedure: PACEMAKER IMPLANT;  Surgeon: Lanier Prude, MD;  Location: [facility] INVASIVE CV LAB;  Service: Cardiovascular;  Laterality: N/A;   PERIPHERAL VASCULAR THROMBECTOMY N/A 05/04/2021   Procedure: PERIPHERAL VASCULAR THROMBECTOMY - VENUS;  Surgeon:  Nada Libman, MD;  Location: [facility] INVASIVE CV LAB;  Service: Cardiovascular;  Laterality: N/A;   TOTAL KNEE ARTHROPLASTY Right    Social History:  reports that she has never smoked. She has never used smokeless tobacco. She reports that she does not drink alcohol and does not use drugs.  Allergies  Allergen Reactions   Allegra [Fexofenadine Hcl] Other (See Comments)    "takes me to another world"   Ativan [Lorazepam] Other (See Comments)    Hallucinations   Azithromycin Nausea And Vomiting   Codeine Hives   Doxycycline Hyclate Nausea Only   Penicillins Hives    Has patient had a PCN reaction causing immediate rash, facial/tongue/throat swelling, SOB or lightheadedness with hypotension: no Has patient had a PCN reaction causing severe rash involving mucus membranes or skin necrosis: no Has patient had a PCN reaction that required hospitalization: no Has patient had a PCN reaction occurring within the last 10 years: no If all of the above answers are "NO", then may proceed with Cephalosporin use.    Trazodone And Nefazodone Nausea Only   Zyrtec [Cetirizine] Other (See Comments)    "takes me to another world"    Family History  Problem Relation Age of Onset   Dementia Neg Hx    Alzheimer's disease Neg Hx     Prior to Admission medications   Medication Sig Start Date End Date Taking? Authorizing Provider  ALPRAZolam (XANAX) 0.25 MG tablet Take 0.25 mg by mouth 2 (two) times daily as needed.  08/02/22   [provider]  buPROPion (WELLBUTRIN XL) 150 MG 24 hr tablet Take 150 mg by mouth daily.    [provider]  busPIRone (BUSPAR) 10 MG tablet Take 1 tablet (10 mg total) by mouth 2 (two) times daily. May also take 1 tablet (10 mg total) as needed (for worsening agitation.). 03/08/23   Lomax, Amy, NP  donepezil (ARICEPT) 10 MG tablet TAKE 1 TABLET(10 MG) BY MOUTH AT BEDTIME 03/08/23   Lomax, Amy, NP  ELIQUIS 5 MG TABS tablet TAKE 1 TABLET(5 MG) BY MOUTH TWICE  DAILY Patient not taking: Reported on 03/08/2023 07/21/22   Maeola Harman, MD  memantine (NAMENDA) 10 MG tablet Take 1 tablet (10 mg total) by mouth 2 (two) times daily. 03/08/23   Shawnie Dapper, NP    Physical Exam: Vitals:   06/26/23 1830 06/26/23 1831 06/26/23 2137 06/26/23 2213  BP: (!) 131/116 107/89 (!) 167/56 (!) 138/90  Pulse: 97  77 84  Resp: 20 18 16    Temp:      SpO2: 97% 98% 97%   Weight:    67.5 kg  Height:    5\' 6"  (1.676 m)   Constitutional: Resting comfortably Respiratory: clear to auscultation bilaterally, no wheezing, no crackles. Normal respiratory effort. No accessory muscle use.  Cardiovascular: Regular rate and rhythm, no murmurs / rubs / gallops. No extremity edema. 2+ pedal pulses. No carotid bruits.  Abdomen: no tenderness, no masses palpated. No hepatosplenomegaly. Bowel sounds positive.  Neurologic: Pt asleep at this time, deferred exam since pt with severe agitation earlier and dont really want to wake her up for this.  RN assures me she can MAE purposefully very well (punching and kicking staff earlier). Psychiatric: Deferred for the moment as pt asleep.  Data Reviewed:    Labs on Admission: I have personally reviewed following labs and imaging studies  CBC: Recent Labs  Lab 06/26/23 1831  WBC 10.3  NEUTROABS 7.6  HGB 13.7  HCT 40.3  MCV 90.8  PLT 311   Basic Metabolic Panel: Recent Labs  Lab 06/26/23 1831  NA 134*  K 5.0  CL 100  CO2 25  GLUCOSE 51*  BUN 12  CREATININE 1.09*  CALCIUM 9.9   GFR: Estimated Creatinine Clearance: 40.5 mL/min (A) (by C-G formula based on SCr of 1.09 mg/dL (H)). Liver Function Tests: Recent Labs  Lab 06/26/23 1831  AST 12*  ALT 6  ALKPHOS 85  BILITOT 0.6  PROT 7.5  ALBUMIN 3.8   No results for input(s): "LIPASE", "AMYLASE" in the last 168 hours. No results for input(s): "AMMONIA" in the last 168 hours. Coagulation Profile: No results for input(s): "INR", "PROTIME" in the last 168  hours. Cardiac Enzymes: No results for input(s): "CKTOTAL", "CKMB", "CKMBINDEX", "TROPONINI" in the last 168 hours. BNP (last 3 results) No results for input(s): "PROBNP" in the last 8760 hours. HbA1C: No results for input(s): "HGBA1C" in the last 72 hours. CBG: Recent Labs  Lab 06/26/23 1947  GLUCAP 94   Lipid Profile: No results for input(s): "CHOL", "HDL", "LDLCALC", "TRIG", "CHOLHDL", "LDLDIRECT" in the last 72 hours. Thyroid Function Tests: No results for input(s): "TSH", "T4TOTAL", "FREET4", "T3FREE", "THYROIDAB" in the last 72 hours. Anemia Panel: No results for input(s): "VITAMINB12", "FOLATE", "FERRITIN", "TIBC", "IRON", "RETICCTPCT" in the last 72 hours. Urine analysis:    Component Value Date/Time   COLORURINE YELLOW 06/26/2023 1831   APPEARANCEUR HAZY (A) 06/26/2023 1831   LABSPEC 1.035 (H) 06/26/2023 1831   PHURINE 6.0  06/26/2023 1831   GLUCOSEU NEGATIVE 06/26/2023 1831   HGBUR TRACE (A) 06/26/2023 1831   BILIRUBINUR NEGATIVE 06/26/2023 1831   KETONESUR 40 (A) 06/26/2023 1831   PROTEINUR 30 (A) 06/26/2023 1831   UROBILINOGEN 0.2 02/01/2010 1201   NITRITE POSITIVE (A) 06/26/2023 1831   LEUKOCYTESUR LARGE (A) 06/26/2023 1831    Radiological Exams on Admission: DG Chest Portable 1 View  Result Date: 06/26/2023 CLINICAL DATA:  Diarrhea and altered mental status. EXAM: PORTABLE CHEST 1 VIEW COMPARISON:  May 11, 2021 FINDINGS: There is stable single lead ventricular pacer positioning. The heart size and mediastinal contours are within normal limits. Mild atelectasis and/or infiltrate is seen within the lateral aspect of the left lung base. A small left pleural effusion is noted. No pneumothorax is identified. Multilevel degenerative changes are seen throughout the thoracic spine. IMPRESSION: 1. Mild left basilar atelectasis and/or infiltrate. 2. Small left pleural effusion. Electronically Signed   By: Aram Candela M.D.   On: 06/26/2023 20:39    EKG:  Independently reviewed.   Assessment and Plan: * Acute encephalopathy Pt most likely has delirium secondary to UTI. DDx includes: 1) PNA: though no respiratory symptoms, no cough, normal O2 sat 2) otitis externa: on rocephin for UTI anyhow 3) other causes  Treat UTI as below Will hold off on CNS imaging for the moment.  Urinary tract infection UA c/w UTI. Pt with agitation which is her typical UTI symptom per daughter. Empiric rocephin UCx pending No SIRS  Otitis externa Continue ciprodex On rocephin for UTI as well now.  Dementia with behavioral disturbance (HCC) Cont namenda and aricept.      Advance Care Planning:   Code Status: Full Code per daughter.  Consults: None  Family Communication: No family in room  Severity of Illness: The appropriate patient status for this patient is OBSERVATION. Observation status is judged to be reasonable and necessary in order to provide the required intensity of service to ensure the patient's safety. The patient's presenting symptoms, physical exam findings, and initial radiographic and laboratory data in the context of their medical condition is felt to place them at decreased risk for further clinical deterioration. Furthermore, it is anticipated that the patient will be medically stable for discharge from the hospital within 2 midnights of admission.   Author: Hillary Bow., DO 06/27/2023 4:54 AM  For on call review [email or web address].

## 2023-06-28 DIAGNOSIS — G9341 Metabolic encephalopathy: Secondary | ICD-10-CM | POA: Diagnosis not present

## 2023-06-28 LAB — BASIC METABOLIC PANEL
Anion gap: 8 (ref 5–15)
BUN: 6 mg/dL — ABNORMAL LOW (ref 8–23)
CO2: 22 mmol/L (ref 22–32)
Calcium: 9.4 mg/dL (ref 8.9–10.3)
Chloride: 107 mmol/L (ref 98–111)
Creatinine, Ser: 1 mg/dL (ref 0.44–1.00)
GFR, Estimated: 58 mL/min — ABNORMAL LOW (ref >60)
Glucose, Bld: 85 mg/dL (ref 70–99)
Potassium: 3.9 mmol/L (ref 3.5–5.1)
Sodium: 137 mmol/L (ref 135–145)

## 2023-06-28 LAB — CBC
HCT: 39 % (ref 36.0–46.0)
Hemoglobin: 12.7 g/dL (ref 12.0–15.0)
MCH: 29.7 pg (ref 26.0–34.0)
MCHC: 32.6 g/dL (ref 30.0–36.0)
MCV: 91.1 fL (ref 80.0–100.0)
Platelets: 386 10*3/uL (ref 150–400)
RBC: 4.28 MIL/uL (ref 3.87–5.11)
RDW: 13.2 % (ref 11.5–15.5)
WBC: 7.2 10*3/uL (ref 4.0–10.5)
nRBC: 0 % (ref 0.0–0.2)

## 2023-06-28 NOTE — Progress Notes (Signed)
Patient refused all vital signs, was able to apple bp cuff however patient  would not keep still, swinging and machine would not register. Pt has loose incontinent stool, did allow staff to perform peri care.

## 2023-06-28 NOTE — Progress Notes (Signed)
Physical Therapy Treatment Patient Details Name: Wendy Watts MRN: [id number] DOB: [birth year] Today's Date: 06/28/2023   History of Present Illness Pt is a 77 y/o F presenting to ED on 11/11 with agitation/AMS. UA concerning for UTI. Pt also with recent R ear infection. PMH includes advanced dementia, HLD, PPM    PT Comments  PT treatment focused on bed mobility and sitting balance at EOB. PT encouraged spouse to stay for PT session; however, he decided to leave. Pt agitation/ resistance limited PT treatment. Pt was able to sit EOB  with minA x2 for physical assistance due to pt inability to initiate supine to sit. Pt does not follow commands and was resistant to transfer or gait training. PT attempted to use music as motivation to stand, but the pt was consistently resistant to participate. The pt will continue to benefit from skilled PT to address remaining deficits.   If plan is discharge home, recommend the following: A lot of help with walking and/or transfers;A lot of help with bathing/dressing/bathroom;Assistance with cooking/housework;Assistance with feeding;Direct supervision/assist for medications management;Direct supervision/assist for financial management;Assist for transportation;Help with stairs or ramp for entrance;Supervision due to cognitive status   Can travel by private vehicle        Equipment Recommendations  Wheelchair (measurements PT)    Recommendations for Other Services       Precautions / Restrictions Precautions Precautions: Fall Restrictions Weight Bearing Restrictions: No     Mobility  Bed Mobility Overal bed mobility: Needs Assistance Bed Mobility: Supine to Sit, Sit to Supine     Supine to sit: Mod assist, +2 for physical assistance Sit to supine: Min assist        Transfers                   General transfer comment: Pt was unable to stand due to increased agitation/ resistance to attempt to stand    Ambulation/Gait                    Stairs             Wheelchair Mobility     Tilt Bed    Modified Rankin (Stroke Patients Only)       Balance Overall balance assessment: Needs assistance Sitting-balance support: No upper extremity supported, Feet supported Sitting balance-Leahy Scale: Fair Sitting balance - Comments: Pt sitting EOB                                    Cognition Arousal: Alert Behavior During Therapy: Agitated Overall Cognitive Status: History of cognitive impairments - at baseline                                 General Comments: Pt does not follow commands consistently, often resistant to attempts at mobilizing. Advanced dementia, unable to compare cogntion to baseline due to husband leaving at start of PT session.        Exercises      General Comments General comments (skin integrity, edema, etc.): Pt agitation limited PT session due to resistance to work on transfers or gait      Pertinent Vitals/Pain Pain Assessment Pain Assessment: PAINAD Breathing: normal Negative Vocalization: occasional moan/groan, low speech, negative/disapproving quality Facial Expression: sad, frightened, frown Body Language: tense, distressed pacing, fidgeting Consolability: distracted or reassured by voice/touch PAINAD Score:  4    Home Living                          Prior Function            PT Goals (current goals can now be found in the care plan section) Acute Rehab PT Goals Patient Stated Goal: to return home, improve balance PT Goal Formulation: With family Time For Goal Achievement: 07/11/23 Potential to Achieve Goals: Fair Progress towards PT goals: Progressing toward goals    Frequency    Min 1X/week      PT Plan      Co-evaluation              AM-PAC PT "6 Clicks" Mobility   Outcome Measure  Help needed turning from your back to your side while in a flat bed without using bedrails?: A Lot Help needed  moving from lying on your back to sitting on the side of a flat bed without using bedrails?: A Lot Help needed moving to and from a bed to a chair (including a wheelchair)?: Total Help needed standing up from a chair using your arms (e.g., wheelchair or bedside chair)?: A Lot Help needed to walk in hospital room?: Total Help needed climbing 3-5 steps with a railing? : Total 6 Click Score: 9    End of Session Equipment Utilized During Treatment: Gait belt Activity Tolerance: Treatment limited secondary to agitation Patient left: in bed;with call bell/phone within reach;with bed alarm set Nurse Communication: Mobility status PT Visit Diagnosis: Other abnormalities of gait and mobility (R26.89);Muscle weakness (generalized) (M62.81)     Time: 2725-3664 PT Time Calculation (min) (ACUTE ONLY): 10 min  Charges:    $Therapeutic Activity: 8-22 mins PT General Charges $$ ACUTE PT VISIT: 1 Visit                     Caryl Comes, SPT Acute Rehabilitation Office Phone [phone]    Caryl Comes 06/28/2023, 2:52 PM

## 2023-06-28 NOTE — Plan of Care (Signed)
  Problem: Clinical Measurements: Goal: Ability to maintain clinical measurements within normal limits will improve Outcome: Progressing Goal: Will remain free from infection Outcome: Progressing Goal: Diagnostic test results will improve Outcome: Progressing Goal: Respiratory complications will improve Outcome: Progressing   Problem: Activity: Goal: Risk for activity intolerance will decrease Outcome: Progressing   Problem: Coping: Goal: Level of anxiety will decrease Outcome: Progressing   Problem: Elimination: Goal: Will not experience complications related to bowel motility Outcome: Progressing   Problem: Pain Management: Goal: General experience of comfort will improve Outcome: Progressing

## 2023-06-28 NOTE — Progress Notes (Signed)
PROGRESS NOTE    Wendy Watts  ZOX:[id number] DOB: 1945/10/20 DOA: 06/26/2023 PCP: Daisy Floro, MD  Subjective: Says leave me alone  Hospital Course: HPI:  77 y.o. female with medical history significant of advanced dementia, HLD, PPM.  In with UTI and hospital induced delirium       Assessment and Plan: * Acute metabolic encephalopathy On admission, Pt most likely has delirium secondary to UTI. DDx includes:1) PNA: though no respiratory symptoms, no cough, normal O2 sat, 2) otitis externa: on rocephin for UTI anyhow 3) other causes..  Treat UTI as below. Will hold off on CNS imaging for the moment.  Urinary tract infection - awaiting urine cx. Continue IV rocephin  Otitis externa -continue with ciprodex otic drops  Nondiabetic hypoglycemia -monitor  Cardiac pacemaker in situ - stable. Chronic.  Dementia with behavioral disturbance (HCC) -continue namenda/aricept. Moderate to severe per dtr Wendy Watts   DVT prophylaxis: enoxaparin (LOVENOX) injection 40 mg Start: 06/27/23 1000     Code Status: Full Code- family would like [name] to try Family Communication: called pt's dtr Wendy Watts Disposition Plan: return home Reason for continuing need for hospitalization: remains on IV Abx.  Objective: Vitals:   06/26/23 1831 06/26/23 2137 06/26/23 2213 06/27/23 0741  BP: 107/89 (!) 167/56 (!) 138/90 121/77  Pulse:  77 84 86  Resp: 18 16  17   Temp:   97.8 F (36.6 C) 99 F (37.2 C)  TempSrc:   Axillary Oral  SpO2: 98% 97%  98%  Weight:   67.5 kg   Height:   5\' 6"  (1.676 m)    No intake or output data in the 24 hours ending 06/28/23 1246  Filed Weights   06/26/23 1504 06/26/23 2213  Weight: 66.7 kg 67.5 kg    Examination:   General: Appearance:    Well developed, well nourished female in no acute distress     Lungs:     respirations unlabored  Heart:    Normal heart rate.    MS:   All extremities are intact.   Neurologic:   Awake, alert     Data Reviewed: I  have personally reviewed following labs and imaging studies  CBC: Recent Labs  Lab 06/26/23 1831 06/28/23 0512  WBC 10.3 7.2  NEUTROABS 7.6  --   HGB 13.7 12.7  HCT 40.3 39.0  MCV 90.8 91.1  PLT 311 386   Basic Metabolic Panel: Recent Labs  Lab 06/26/23 1831 06/28/23 0512  NA 134* 137  K 5.0 3.9  CL 100 107  CO2 25 22  GLUCOSE 51* 85  BUN 12 6*  CREATININE 1.09* 1.00  CALCIUM 9.9 9.4   GFR: Estimated Creatinine Clearance: 44.1 mL/min (by C-G formula based on SCr of 1 mg/dL). Liver Function Tests: Recent Labs  Lab 06/26/23 1831  AST 12*  ALT 6  ALKPHOS 85  BILITOT 0.6  PROT 7.5  ALBUMIN 3.8   CBG: Recent Labs  Lab 06/26/23 1947  GLUCAP 94    Radiology Studies: DG Chest Portable 1 View  Result Date: 06/26/2023 CLINICAL DATA:  Diarrhea and altered mental status. EXAM: PORTABLE CHEST 1 VIEW COMPARISON:  May 11, 2021 FINDINGS: There is stable single lead ventricular pacer positioning. The heart size and mediastinal contours are within normal limits. Mild atelectasis and/or infiltrate is seen within the lateral aspect of the left lung base. A small left pleural effusion is noted. No pneumothorax is identified. Multilevel degenerative changes are seen throughout the thoracic spine. IMPRESSION: 1.  Mild left basilar atelectasis and/or infiltrate. 2. Small left pleural effusion. Electronically Signed   By: Aram Candela M.D.   On: 06/26/2023 20:39    Scheduled Meds:  buPROPion  150 mg Oral Daily   busPIRone  10 mg Oral BID   ciprofloxacin-dexamethasone  4 drop Right EAR BID   donepezil  10 mg Oral QHS   enoxaparin (LOVENOX) injection  40 mg Subcutaneous Q24H   memantine  10 mg Oral BID   QUEtiapine  25 mg Oral QHS   Continuous Infusions:  cefTRIAXone (ROCEPHIN)  IV 1 g (06/27/23 2222)     LOS: 1 day   Time spent: 40 minutes  [name], DO  [facility]  06/28/2023, 12:46 PM

## 2023-06-29 DIAGNOSIS — G9341 Metabolic encephalopathy: Secondary | ICD-10-CM | POA: Diagnosis not present

## 2023-06-29 LAB — URINE CULTURE
Culture: >=100000 — AB
Special Requests: NORMAL

## 2023-06-29 MED ORDER — ALPRAZOLAM 0.25 MG PO TABS
0.2500 mg | ORAL_TABLET | Freq: Two times a day (BID) | ORAL | Status: DC
  Administered 2023-06-29 – 2023-06-30 (×3): 0.25 mg via ORAL
  Filled 2023-06-29 (×3): qty 1

## 2023-06-29 MED ORDER — CEPHALEXIN 500 MG PO CAPS
500.0000 mg | ORAL_CAPSULE | Freq: Two times a day (BID) | ORAL | Status: DC
  Administered 2023-06-29 – 2023-06-30 (×3): 500 mg via ORAL
  Filled 2023-06-29 (×3): qty 1

## 2023-06-29 NOTE — Progress Notes (Signed)
PROGRESS NOTE    Wendy Watts  ZOX:[id number] DOB: 06/24/1946 DOA: 06/26/2023 PCP: Daisy Floro, MD  Subjective: Much calmer today-- asking if she can rest Denies pain in wrist/arm  Hospital Course: HPI:  77 y.o. female with medical history significant of advanced dementia, HLD, PPM.  In with UTI and hospital induced delirium.  Awaiting culture for d/c planning       Assessment and Plan: Acute metabolic encephalopathy On admission, Pt most likely has delirium secondary to UTI. DDx includes:1) PNA: though no respiratory symptoms, no cough, normal O2 sat, 2) otitis externa: on rocephin for UTI anyhow 3) other causes..  Treat UTI as below. Will hold off on CNS imaging for the moment.  Urinary tract infection - urine culture with Klebsiella - Continue IV rocephin and plan for change to Po upon d/c  Otitis externa -continue with ciprodex otic drops  Nondiabetic hypoglycemia -monitor  Cardiac pacemaker in situ - stable. Chronic.  Dementia with behavioral disturbance (HCC) -continue namenda/aricept. Moderate to severe per dtr [name]   DVT prophylaxis: enoxaparin (LOVENOX) injection 40 mg Start: 06/27/23 1000     Code Status: Full Code- family would like [name] to try CPR/intubation Family Communication: called pt's dtr [name] Disposition Plan: return home   Objective: Vitals:   06/27/23 0741 06/28/23 1756 06/28/23 1956 06/29/23 0747  BP:  101/79 (!) 119/53 119/64  Pulse:  87 84 76  Resp: 17     Temp: 99 F (37.2 C)     TempSrc: Oral     SpO2: 98%   98%  Weight:      Height:        Intake/Output Summary (Last 24 hours) at 06/29/2023 0919 Last data filed at 06/29/2023 0416 Gross per 24 hour  Intake 320.43 ml  Output --  Net 320.43 ml    Filed Weights   06/26/23 1504 06/26/23 2213  Weight: 66.7 kg 67.5 kg    Examination:   General: Appearance:    elderly female in no acute distress   No tenderness to palpation in wrist/hands  Lungs:       respirations unlabored  Heart:    Normal heart rate.   MS:   All extremities are intact.   Neurologic:   Awake, alert-- much calmer and cooperative     Data Reviewed: I have personally reviewed following labs and imaging studies  CBC: Recent Labs  Lab 06/26/23 1831 06/28/23 0512  WBC 10.3 7.2  NEUTROABS 7.6  --   HGB 13.7 12.7  HCT 40.3 39.0  MCV 90.8 91.1  PLT 311 386   Basic Metabolic Panel: Recent Labs  Lab 06/26/23 1831 06/28/23 0512  NA 134* 137  K 5.0 3.9  CL 100 107  CO2 25 22  GLUCOSE 51* 85  BUN 12 6*  CREATININE 1.09* 1.00  CALCIUM 9.9 9.4   GFR: Estimated Creatinine Clearance: 44.1 mL/min (by C-G formula based on SCr of 1 mg/dL). Liver Function Tests: Recent Labs  Lab 06/26/23 1831  AST 12*  ALT 6  ALKPHOS 85  BILITOT 0.6  PROT 7.5  ALBUMIN 3.8   CBG: Recent Labs  Lab 06/26/23 1947  GLUCAP 94    Radiology Studies: No results found.  Scheduled Meds:  buPROPion  150 mg Oral Daily   busPIRone  10 mg Oral BID   ciprofloxacin-dexamethasone  4 drop Right EAR BID   donepezil  10 mg Oral QHS   enoxaparin (LOVENOX) injection  40 mg Subcutaneous Q24H  memantine  10 mg Oral BID   QUEtiapine  25 mg Oral QHS   Continuous Infusions:  cefTRIAXone (ROCEPHIN)  IV Stopped (06/28/23 1756)     LOS: 2 days   Time spent: 40 minutes  [name], DO  [facility]  06/29/2023, 9:19 AM

## 2023-06-29 NOTE — Progress Notes (Signed)
Occupational Therapy Treatment Patient Details Name: Wendy Watts MRN: [id number] DOB: [birth year] Today's Date: 06/29/2023   History of present illness Pt is a 77 y/o F presenting to ED on 11/11 with agitation/AMS. UA concerning for UTI. Pt also with recent R ear infection. Son in room today (11/14) reports to OT today that he is not sure that pt did not have COVID 2.5 weeks ago. PMH includes advanced dementia, HLD, PPM   OT comments  Wendy Watts is a 77 yo female with advanced dementia that is fighting a UTI and ear infection as well as recently perhaps having COVID per son and so she is more confused than her normal and has not been cooperative in getting up with therapies to try and ambulate. She is not following commands but is awake and talkative about what is on her mind. She will continue to benefit from acute OT to help family in determining next venue of care. I also asked Dr. Benjamine Mola to put in a pallative/hospice consult to help family navigate this heart wrenching disease. We will continue to follow.      If plan is discharge home, recommend the following:  Two people to help with walking and/or transfers;Two people to help with bathing/dressing/bathroom;Assistance with cooking/housework;Assistance with feeding;Help with stairs or ramp for entrance;Assist for transportation;Direct supervision/assist for financial management;Direct supervision/assist for medications management   Equipment Recommendations  Other (comment) (if home and non ambulatory then hospital bed, hoyer lift, and wheelchair)       Precautions / Restrictions Precautions Precautions: Fall Restrictions Weight Bearing Restrictions: No              ADL either performed or assessed with clinical judgement   ADL                   Upper Body Dressing : Total assistance;Bed level Upper Body Dressing Details (indicate cue type and reason): Pt's gown was wet so needed to be changed. It took alot of patience and  coaxing put finally able to after about 10 minutes doing it in staged (ie: got gown unsnapped and tied, let it just lay on her for a few minutes, then offered her to put arm in new gown but she resisted, waited a couple more minutes while I sat and talked with her, then she willingly let me put the dry gown on her).                        Extremity/Trunk Assessment Upper Extremity Assessment Upper Extremity Assessment: Overall WFL for tasks assessed            Vision   Additional Comments: unknown          Cognition Arousal: Alert Behavior During Therapy: Agitated Overall Cognitive Status: History of cognitive impairments - at baseline                                 General Comments: Pt does not follow one step commands, resistant to changing wet gown today, stating she wants to get up and go--but then does not move (even with son trying to coax her). Advanced dementia. Potential hearing impairment, says what and huh alot but then other times answers questions on first ask and actually better when no directly talking to her but to others.  Pertinent Vitals/ Pain       Pain Assessment Pain Assessment: PAINAD Breathing: normal Negative Vocalization: occasional moan/groan, low speech, negative/disapproving quality Facial Expression: sad, frightened, frown Body Language: tense, distressed pacing, fidgeting Consolability: no need to console PAINAD Score: 3         Frequency  Min 1X/week        Progress Toward Goals  OT Goals(current goals can now be found in the care plan section)  Progress towards OT goals: Not progressing toward goals - comment (due to dementia (increased confusion due to UTI and ear infection))  Acute Rehab OT Goals Patient Stated Goal: pt unable to state OT Goal Formulation: With family (would like for pt to be able to go home) Time For Goal Achievement: 07/11/23 Potential to Achieve Goals: Poor          AM-PAC OT "6 Clicks" Daily Activity     Outcome Measure   Help from another person eating meals?: Total Help from another person taking care of personal grooming?: Total Help from another person toileting, which includes using toliet, bedpan, or urinal?: Total Help from another person bathing (including washing, rinsing, drying)?: Total Help from another person to put on and taking off regular upper body clothing?: Total Help from another person to put on and taking off regular lower body clothing?: Total 6 Click Score: 6    End of Session    OT Visit Diagnosis: Other abnormalities of gait and mobility (R26.89);Other symptoms and signs involving cognitive function;Cognitive communication deficit (R41.841) Symptoms and signs involving cognitive functions:  (dementia)   Activity Tolerance Treatment limited secondary to agitation   Patient Left in bed;with call bell/phone within reach;with bed alarm set;with family/visitor present   Nurse Communication  (changed out pt's gown due to wet, but bed pads and sheets need to be checked)        Time: 4098-1191 OT Time Calculation (min): 27 min  Charges: OT General Charges $OT Visit: 1 Visit OT Treatments $Self Care/Home Management : 23-37 mins  Lindon Romp OT Acute Rehabilitation Services Office [phone]   Evette Georges 06/29/2023, 5:34 PM

## 2023-06-29 NOTE — Plan of Care (Signed)
  Problem: Education: Goal: Knowledge of General Education information will improve Description: Including pain rating scale, medication(s)/side effects and non-pharmacologic comfort measures Outcome: Progressing   Problem: Health Behavior/Discharge Planning: Goal: Ability to manage health-related needs will improve Outcome: Progressing   Problem: Clinical Measurements: Goal: Ability to maintain clinical measurements within normal limits will improve 06/29/2023 1112 by Earlie Raveling, RN Outcome: Progressing 06/29/2023 1112 by Earlie Raveling, RN Outcome: Progressing Goal: Will remain free from infection 06/29/2023 1112 by Earlie Raveling, RN Outcome: Progressing 06/29/2023 1112 by Earlie Raveling, RN Outcome: Progressing Goal: Diagnostic test results will improve 06/29/2023 1112 by Earlie Raveling, RN Outcome: Progressing 06/29/2023 1112 by Earlie Raveling, RN Outcome: Progressing Goal: Respiratory complications will improve 06/29/2023 1112 by Earlie Raveling, RN Outcome: Progressing 06/29/2023 1112 by Earlie Raveling, RN Outcome: Progressing Goal: Cardiovascular complication will be avoided 06/29/2023 1112 by Earlie Raveling, RN Outcome: Progressing 06/29/2023 1112 by Earlie Raveling, RN Outcome: Progressing   Problem: Activity: Goal: Risk for activity intolerance will decrease Outcome: Progressing   Problem: Nutrition: Goal: Adequate nutrition will be maintained Outcome: Progressing   Problem: Coping: Goal: Level of anxiety will decrease Outcome: Progressing   Problem: Elimination: Goal: Will not experience complications related to bowel motility Outcome: Progressing Goal: Will not experience complications related to urinary retention 06/29/2023 1112 by Earlie Raveling, RN Outcome: Progressing 06/29/2023 1112 by Earlie Raveling, RN Outcome: Progressing   Problem: Pain Management: Goal: General experience of comfort will improve Outcome: Progressing    Problem: Safety: Goal: Ability to remain free from injury will improve 06/29/2023 1112 by Earlie Raveling, RN Outcome: Progressing 06/29/2023 1112 by Earlie Raveling, RN Outcome: Progressing   Problem: Skin Integrity: Goal: Risk for impaired skin integrity will decrease 06/29/2023 1112 by Earlie Raveling, RN Outcome: Progressing 06/29/2023 1112 by Earlie Raveling, RN Outcome: Progressing

## 2023-06-29 NOTE — Plan of Care (Signed)

## 2023-06-30 ENCOUNTER — Other Ambulatory Visit (HOSPITAL_COMMUNITY): Payer: Self-pay

## 2023-06-30 ENCOUNTER — Inpatient Hospital Stay (HOSPITAL_COMMUNITY): Payer: PPO

## 2023-06-30 DIAGNOSIS — G9341 Metabolic encephalopathy: Secondary | ICD-10-CM | POA: Diagnosis not present

## 2023-06-30 MED ORDER — DICLOFENAC SODIUM 1 % EX GEL
2.0000 g | Freq: Four times a day (QID) | CUTANEOUS | Status: DC
  Filled 2023-06-30: qty 100

## 2023-06-30 MED ORDER — DICLOFENAC SODIUM 1 % EX GEL: 2.0000 g | Freq: Four times a day (QID) | CUTANEOUS | Status: DC

## 2023-06-30 MED ORDER — CEPHALEXIN 500 MG PO CAPS
500.0000 mg | ORAL_CAPSULE | Freq: Two times a day (BID) | ORAL | 0 refills | Status: DC
  Filled 2023-06-30: qty 4, 2d supply, fill #0

## 2023-06-30 NOTE — Discharge Instructions (Signed)
Can put OTC voltaren gel on wrists/areas that are painful

## 2023-06-30 NOTE — Discharge Summary (Signed)
Physician Discharge Summary  Wendy Watts YQM:[id number] DOB: 31-Mar-1946 DOA: 06/26/2023  PCP: Daisy Floro, MD  Admit date: 06/26/2023 Discharge date: 06/30/2023  Admitted From: home Discharge disposition: home   Recommendations for Outpatient Follow-Up:   Palliative care referral placed- continue code status discussions and GOC   Discharge Diagnosis:   Principal Problem:   Acute metabolic encephalopathy Active Problems:   Urinary tract infection   Otitis externa   Dementia with behavioral disturbance [facility])   Cardiac pacemaker in situ   Nondiabetic hypoglycemia    Discharge Condition: Improved.  Diet recommendation:   Regular.  Wound care: None.  Code status: Full.   History of Present Illness:   Wendy Watts is a 77 y.o. female with medical history significant of advanced dementia, HLD, PPM.   Pt in to ED at [facility] with AMS / agitation.  Pt increasingly confused and agitated at home for past 2-3 days per daughter.  Typically develops these symptoms in setting of UTI, though daughter doesn't report any specific changes to urinary behaviors, odors, etc.   Pt wears depends.  Has had some diarrhea in past day.   Pt oriented to self only at baseline.   In ED: Pt without SIRS, UA is indeed c/w UTI on ED workup: Large LE, positive, nitrites, many bacteria, and > 50 WBC   Pt started on rocephin.   CXR with question of R basilar atelectasis vs infiltrate: though pt satting 97% on RA, no reported cough, congestion etc.   Only other finding of significance was some erythema of R ear canal (otitis externa), though TM looked okay.   Hospital Course by Problem:   Acute metabolic encephalopathy On admission, Pt most likely has delirium secondary to UTI.   Urinary tract infection - urine culture with Klebsiella - change to PO abx and finish course   Otitis externa -treated   Nondiabetic hypoglycemia -monitor   Cardiac pacemaker in situ -  stable. Chronic.   Dementia with behavioral disturbance (HCC) -continue namenda/aricept. Moderate to severe per dtr [facility]    Medical Consultants:      Discharge Exam:   Vitals:   06/30/23 0541 06/30/23 0738  BP: 112/72 122/70  Pulse: 82 63  Resp: 18   Temp: 98.4 F (36.9 C) (!) 97.4 F (36.3 C)  SpO2: 97% 97%   Vitals:   06/28/23 1956 06/29/23 0747 06/30/23 0541 06/30/23 0738  BP: (!) 119/53 119/64 112/72 122/70  Pulse: 84 76 82 63  Resp:   18   Temp:   98.4 F (36.9 C) (!) 97.4 F (36.3 C)  TempSrc:   Oral Oral  SpO2:  98% 97% 97%  Weight:      Height:        General exam: Appears calm and comfortable.    The results of significant diagnostics from this hospitalization (including imaging, microbiology, ancillary and laboratory) are listed below for reference.     Procedures and Diagnostic Studies:   DG Chest Portable 1 View  Result Date: 06/26/2023 CLINICAL DATA:  Diarrhea and altered mental status. EXAM: PORTABLE CHEST 1 VIEW COMPARISON:  May 11, 2021 FINDINGS: There is stable single lead ventricular pacer positioning. The heart size and mediastinal contours are within normal limits. Mild atelectasis and/or infiltrate is seen within the lateral aspect of the left lung base. A small left pleural effusion is noted. No pneumothorax is identified. Multilevel degenerative changes are seen throughout the thoracic spine. IMPRESSION: 1. Mild left basilar  atelectasis and/or infiltrate. 2. Small left pleural effusion. Electronically Signed   By: Aram Candela M.D.   On: 06/26/2023 20:39     Labs:   Basic Metabolic Panel: Recent Labs  Lab 06/26/23 1831 06/28/23 0512  NA 134* 137  K 5.0 3.9  CL 100 107  CO2 25 22  GLUCOSE 51* 85  BUN 12 6*  CREATININE 1.09* 1.00  CALCIUM 9.9 9.4   GFR Estimated Creatinine Clearance: 44.1 mL/min (by C-G formula based on SCr of 1 mg/dL). Liver Function Tests: Recent Labs  Lab 06/26/23 1831  AST 12*  ALT 6   ALKPHOS 85  BILITOT 0.6  PROT 7.5  ALBUMIN 3.8   No results for input(s): "LIPASE", "AMYLASE" in the last 168 hours. No results for input(s): "AMMONIA" in the last 168 hours. Coagulation profile No results for input(s): "INR", "PROTIME" in the last 168 hours.  CBC: Recent Labs  Lab 06/26/23 1831 06/28/23 0512  WBC 10.3 7.2  NEUTROABS 7.6  --   HGB 13.7 12.7  HCT 40.3 39.0  MCV 90.8 91.1  PLT 311 386   Cardiac Enzymes: No results for input(s): "CKTOTAL", "CKMB", "CKMBINDEX", "TROPONINI" in the last 168 hours. BNP: Invalid input(s): "POCBNP" CBG: Recent Labs  Lab 06/26/23 1947  GLUCAP 94   D-Dimer No results for input(s): "DDIMER" in the last 72 hours. Hgb A1c No results for input(s): "HGBA1C" in the last 72 hours. Lipid Profile No results for input(s): "CHOL", "HDL", "LDLCALC", "TRIG", "CHOLHDL", "LDLDIRECT" in the last 72 hours. Thyroid function studies No results for input(s): "TSH", "T4TOTAL", "T3FREE", "THYROIDAB" in the last 72 hours.  Invalid input(s): "FREET3" Anemia work up No results for input(s): "VITAMINB12", "FOLATE", "FERRITIN", "TIBC", "IRON", "RETICCTPCT" in the last 72 hours. Microbiology Recent Results (from the past 240 hour(s))  Urine Culture     Status: Abnormal   Collection Time: 06/26/23  7:21 PM   Specimen: Urine, Catheterized  Result Value Ref Range Status   Specimen Description   Final    URINE, CATHETERIZED Performed at [facility], [address], [place], [place] [address]    Special Requests   Final    Normal Performed at [facility], [address], [place], [place] [address]    Culture >=100,000 COLONIES/mL KLEBSIELLA PNEUMONIAE (A)  Final   Report Status 06/29/2023 FINAL  Final   Organism ID, Bacteria KLEBSIELLA PNEUMONIAE (A)  Final      Susceptibility   Klebsiella pneumoniae - MIC*    AMPICILLIN RESISTANT Resistant     CEFAZOLIN <=4 SENSITIVE Sensitive     CEFEPIME <=0.12  SENSITIVE Sensitive     CEFTRIAXONE <=0.25 SENSITIVE Sensitive     CIPROFLOXACIN <=0.25 SENSITIVE Sensitive     GENTAMICIN <=1 SENSITIVE Sensitive     IMIPENEM <=0.25 SENSITIVE Sensitive     NITROFURANTOIN 32 SENSITIVE Sensitive     TRIMETH/SULFA <=20 SENSITIVE Sensitive     AMPICILLIN/SULBACTAM <=2 SENSITIVE Sensitive     PIP/TAZO <=4 SENSITIVE Sensitive ug/mL    * >=100,000 COLONIES/mL KLEBSIELLA PNEUMONIAE     Discharge Instructions:   Discharge Instructions     Diet - low sodium heart healthy   Complete by: As directed    Discharge instructions   Complete by: As directed    Referral made for palliative care as an outpatient Home health   Increase activity slowly   Complete by: As directed       Allergies as of 06/30/2023       Reactions  Allegra [fexofenadine Hcl] Other (See Comments)   "takes me to another world"   Ativan [lorazepam] Other (See Comments)   Hallucinations   Azithromycin Nausea And Vomiting, Hives   Cephalosporins    Other Reaction(s): unknown   Cetirizine Other (See Comments), Nausea And Vomiting   "takes me to another world"   Ciprofloxacin    Other Reaction(s): insomnia and hallucinations   Codeine Hives   Doxycycline Hyclate Nausea Only   Fexofenadine Nausea And Vomiting   Hydrocodone-acetaminophen    Other Reaction(s): hallucinations   Iodinated Contrast Media Hives   Lidocaine    Other Reaction(s): unknown   Penicillin G Hives   Penicillins Hives   Has patient had a PCN reaction causing immediate rash, facial/tongue/throat swelling, SOB or lightheadedness with hypotension: no Has patient had a PCN reaction causing severe rash involving mucus membranes or skin necrosis: no Has patient had a PCN reaction that required hospitalization: no Has patient had a PCN reaction occurring within the last 10 years: no If all of the above answers are "NO", then may proceed with Cephalosporin use.   Sertraline    Other Reaction(s): hallucinations    Sulfa Antibiotics Hives   Trazodone    Other Reaction(s): Dizzy   Trazodone And Nefazodone Nausea Only        Medication List     STOP taking these medications    ciprofloxacin-dexamethasone OTIC suspension Commonly known as: CIPRODEX       TAKE these medications    ALPRAZolam 0.25 MG tablet Commonly known as: XANAX Take 0.25 mg by mouth 2 (two) times daily as needed.   buPROPion 150 MG 24 hr tablet Commonly known as: WELLBUTRIN XL Take 150 mg by mouth daily.   busPIRone 10 MG tablet Commonly known as: BUSPAR Take 1 tablet (10 mg total) by mouth 2 (two) times daily. May also take 1 tablet (10 mg total) as needed (for worsening agitation.).   cephALEXin 500 MG capsule Commonly known as: KEFLEX Take 1 capsule (500 mg total) by mouth every 12 (twelve) hours.   diclofenac Sodium 1 % Gel Commonly known as: VOLTAREN Apply 2 g topically 4 (four) times daily.   donepezil 10 MG tablet Commonly known as: ARICEPT TAKE 1 TABLET(10 MG) BY MOUTH AT BEDTIME   memantine 10 MG tablet Commonly known as: Namenda Take 1 tablet (10 mg total) by mouth 2 (two) times daily.        Follow-up Information     [facility] Follow up.   Why: The palliative care will contact you for the first home visit Contact information: [address] [place] [address] [phone]        [facility] Follow up.   Why: The home health agency will contact you for the first home visit. Contact information: [address] [place] [address] [phone]                  Time coordinating discharge: 45 min  Signed:  [name] DO  [facility] 06/30/2023, 1:10 PM

## 2023-06-30 NOTE — Progress Notes (Signed)
PT Cancellation Note  Patient Details Name: Wendy Watts MRN: [id number] DOB: Jul 27, 1946   Cancelled Treatment:    Reason Eval/Treat Not Completed: Patient declined, no reason specified. Pt's daughter is present and prepared to discharge home. Pt's daughter assisted the pt to the bathroom and to the recliner earlier this afternoon. Pt's daughter expresses confidence in being able to assist the patient in mobilizing at home. PT will follow up if the pt remains admitted, however family are prepared to take the patient home at this time.   Arlyss Gandy 06/30/2023, 1:03 PM

## 2023-06-30 NOTE — Care Management Important Message (Signed)
Important Message  Patient Details  Name: Wendy Watts MRN: [id number] Date of Birth: 1946-04-26   Important Message Given:  Yes - Medicare IM     Dorena Bodo 06/30/2023, 3:19 PM

## 2023-06-30 NOTE — TOC Transition Note (Signed)
Transition of Care [facility]) - CM/SW Discharge Note   Patient Details  Name: Wendy Watts MRN: [id number] Date of Birth: 06/18/1946  Transition of [facility]) CM/SW Contact:  Kermit Balo, RN Phone Number: 06/30/2023, 10:41 AM   Clinical Narrative:     Pt is discharging home with family. She has walker at home. Wheelchair recommended but daughter states the patient wont use it and they prefer to stay with the walker. Family assists with ambulation. Daughter did agree to home health and asked to use [facility] as they have used them in the past. Referral sent to Amy with [facility]. Information on the AVS and [facility] will contact them for the first home visit. Daughter also interested in palliative care with [facility]. CM has sent the referral to [facility] with [facility]. They will contact the daughter for the first home visit. Family to provide transportation home.   Final next level of care: Home w Home Health Services Barriers to Discharge: No Barriers Identified   Patient Goals and CMS Choice CMS Medicare.gov Compare Post Acute Care list provided to:: Patient Represenative (must comment) Choice offered to / list presented to : Adult Children  Discharge Placement                         Discharge Plan and Services Additional resources added to the After Visit Summary for                            [facility] Arranged: PT [facility] Agency: [facility] Date [facility] Contacted: 06/30/23   Representative spoke with at [facility] Agency: Amy  Social Determinants of Health (SDOH) Interventions SDOH Screenings   Food Insecurity: No Food Insecurity (06/27/2023)  Housing: Low Risk  (06/27/2023)  Transportation Needs: No Transportation Needs (06/27/2023)  Utilities: Not At Risk (06/27/2023)  Tobacco Use: Low Risk  (06/26/2023)     Readmission Risk Interventions     No data to display

## 2023-06-30 NOTE — Progress Notes (Signed)
Occupational Therapy Treatment Patient Details Name: Wendy Watts MRN: [id number] DOB: 1945-08-25 Today's Date: 06/30/2023   History of present illness Pt is a 77 y/o F presenting to ED on 11/11 with agitation/AMS. UA concerning for UTI. Pt also with recent R ear infection. Son in room today (11/14) reports to OT today that he is not sure that pt did not have COVID 2.5 weeks ago. PMH includes advanced dementia, HLD, PPM   OT comments  Pt with increased agitation with encouragement for OOB to chair. She was able to complete bed mobility with supervision. Unable to transfer pt bed to chair for breakfast due to increasing agitation with both husband and OTs attempts. Total assist to change gown at bed level. Pt able to drink from straw while seated EOB. Repositioned pt in bed for eating, pt did not initiate self feeding, husband assisting.      If plan is discharge home, recommend the following:  Two people to help with walking and/or transfers;Two people to help with bathing/dressing/bathroom;Assistance with cooking/housework;Assistance with feeding;Help with stairs or ramp for entrance;Assist for transportation;Direct supervision/assist for financial management;Direct supervision/assist for medications management   Equipment Recommendations  Other (comment) (if non ambulatory w/c, w/c cushion, hospital bed)    Recommendations for Other Services      Precautions / Restrictions Precautions Precautions: Fall Restrictions Weight Bearing Restrictions: No       Mobility Bed Mobility Overal bed mobility: Needs Assistance Bed Mobility: Supine to Sit, Sit to Supine     Supine to sit: Supervision Sit to supine: Supervision   General bed mobility comments: cognition appears to be the primary limiting factor for pt to perform bed mobility upon command, but she is able to complete without physical assist    Transfers                   General transfer comment: Pt was unable to  stand due to increased agitation/ resistance to attempt to stand     Balance   Sitting-balance support: No upper extremity supported, Feet supported Sitting balance-Leahy Scale: Fair                                     ADL either performed or assessed with clinical judgement   ADL Overall ADL's : Needs assistance/impaired Eating/Feeding: Set up;Sitting Eating/Feeding Details (indicate cue type and reason): can drink with a cup with straw             Upper Body Dressing : Total assistance;Bed level Upper Body Dressing Details (indicate cue type and reason): changed wet gown                        Extremity/Trunk Assessment              Vision       Perception     Praxis      Cognition Arousal: Alert Behavior During Therapy: Agitated Overall Cognitive Status: History of cognitive impairments - at baseline                                 General Comments: focused attention, does not follow commands        Exercises      Shoulder Instructions       General Comments      Pertinent  Vitals/ Pain       Pain Assessment Pain Assessment: No/denies pain  Home Living                                          Prior Functioning/Environment              Frequency  Min 1X/week        Progress Toward Goals  OT Goals(current goals can now be found in the care plan section)  Progress towards OT goals: Not progressing toward goals - comment  Acute Rehab OT Goals OT Goal Formulation: With family Time For Goal Achievement: 07/11/23 Potential to Achieve Goals: Poor  Plan      Co-evaluation                 AM-PAC OT "6 Clicks" Daily Activity     Outcome Measure   Help from another person eating meals?: A Lot Help from another person taking care of personal grooming?: Total Help from another person toileting, which includes using toliet, bedpan, or urinal?: Total Help from another  person bathing (including washing, rinsing, drying)?: Total Help from another person to put on and taking off regular upper body clothing?: Total Help from another person to put on and taking off regular lower body clothing?: Total 6 Click Score: 7    End of Session    OT Visit Diagnosis: Other symptoms and signs involving cognitive function   Activity Tolerance     Patient Left in bed;with call bell/phone within reach;with bed alarm set;with family/visitor present   Nurse Communication          Time: 0910-0950 OT Time Calculation (min): 40 min  Charges: OT General Charges $OT Visit: 1 Visit OT Treatments $Self Care/Home Management : 38-52 mins  Berna Spare, OTR/L Acute Rehabilitation Services Office: [phone]   Evern Bio 06/30/2023, 10:22 AM

## 2023-07-11 DIAGNOSIS — E162 Hypoglycemia, unspecified: Secondary | ICD-10-CM | POA: Diagnosis not present

## 2023-07-11 DIAGNOSIS — G9341 Metabolic encephalopathy: Secondary | ICD-10-CM | POA: Diagnosis not present

## 2023-07-11 DIAGNOSIS — E785 Hyperlipidemia, unspecified: Secondary | ICD-10-CM | POA: Diagnosis not present

## 2023-07-11 DIAGNOSIS — F03C18 Unspecified dementia, severe, with other behavioral disturbance: Secondary | ICD-10-CM | POA: Diagnosis not present

## 2023-07-11 DIAGNOSIS — H6090 Unspecified otitis externa, unspecified ear: Secondary | ICD-10-CM | POA: Diagnosis not present

## 2023-07-11 DIAGNOSIS — N390 Urinary tract infection, site not specified: Secondary | ICD-10-CM | POA: Diagnosis not present

## 2023-07-11 DIAGNOSIS — Z950 Presence of cardiac pacemaker: Secondary | ICD-10-CM | POA: Diagnosis not present

## 2023-07-12 ENCOUNTER — Encounter (HOSPITAL_COMMUNITY): Payer: Self-pay | Admitting: Emergency Medicine

## 2023-07-12 ENCOUNTER — Other Ambulatory Visit: Payer: Self-pay

## 2023-07-12 ENCOUNTER — Emergency Department (HOSPITAL_COMMUNITY): Payer: PPO

## 2023-07-12 ENCOUNTER — Emergency Department (HOSPITAL_COMMUNITY)
Admission: EM | Admit: 2023-07-12 | Discharge: 2023-07-12 | Disposition: A | Discharge status: Home / Self Care | Payer: PPO | Attending: Emergency Medicine | Admitting: Emergency Medicine

## 2023-07-12 DIAGNOSIS — Z96642 Presence of left artificial hip joint: Secondary | ICD-10-CM | POA: Diagnosis not present

## 2023-07-12 DIAGNOSIS — F0390 Unspecified dementia without behavioral disturbance: Secondary | ICD-10-CM | POA: Diagnosis not present

## 2023-07-12 DIAGNOSIS — R609 Edema, unspecified: Secondary | ICD-10-CM | POA: Diagnosis not present

## 2023-07-12 DIAGNOSIS — M7989 Other specified soft tissue disorders: Secondary | ICD-10-CM | POA: Insufficient documentation

## 2023-07-12 DIAGNOSIS — M2012 Hallux valgus (acquired), left foot: Secondary | ICD-10-CM | POA: Diagnosis not present

## 2023-07-12 DIAGNOSIS — M19072 Primary osteoarthritis, left ankle and foot: Secondary | ICD-10-CM | POA: Diagnosis not present

## 2023-07-12 DIAGNOSIS — R2240 Localized swelling, mass and lump, unspecified lower limb: Secondary | ICD-10-CM | POA: Diagnosis not present

## 2023-07-12 DIAGNOSIS — M79605 Pain in left leg: Secondary | ICD-10-CM | POA: Diagnosis not present

## 2023-07-12 LAB — BASIC METABOLIC PANEL
Anion gap: 10 (ref 5–15)
BUN: 12 mg/dL (ref 8–23)
CO2: 22 mmol/L (ref 22–32)
Calcium: 10 mg/dL (ref 8.9–10.3)
Chloride: 102 mmol/L (ref 98–111)
Creatinine, Ser: 1.27 mg/dL — ABNORMAL HIGH (ref 0.44–1.00)
GFR, Estimated: 44 mL/min — ABNORMAL LOW (ref >60)
Glucose, Bld: 105 mg/dL — ABNORMAL HIGH (ref 70–99)
Potassium: 4.4 mmol/L (ref 3.5–5.1)
Sodium: 134 mmol/L — ABNORMAL LOW (ref 135–145)

## 2023-07-12 LAB — CBC WITH DIFFERENTIAL/PLATELET
Abs Immature Granulocytes: 0.06 10*3/uL (ref 0.00–0.07)
Basophils Absolute: 0.1 10*3/uL (ref 0.0–0.1)
Basophils Relative: 0 %
Eosinophils Absolute: 0.1 10*3/uL (ref 0.0–0.5)
Eosinophils Relative: 1 %
HCT: 40.8 % (ref 36.0–46.0)
Hemoglobin: 13.5 g/dL (ref 12.0–15.0)
Immature Granulocytes: 1 %
Lymphocytes Relative: 8 %
Lymphs Abs: 1 10*3/uL (ref 0.7–4.0)
MCH: 30.3 pg (ref 26.0–34.0)
MCHC: 33.1 g/dL (ref 30.0–36.0)
MCV: 91.7 fL (ref 80.0–100.0)
Monocytes Absolute: 1 10*3/uL (ref 0.1–1.0)
Monocytes Relative: 8 %
Neutro Abs: 10.6 10*3/uL — ABNORMAL HIGH (ref 1.7–7.7)
Neutrophils Relative %: 82 %
Platelets: 249 10*3/uL (ref 150–400)
RBC: 4.45 MIL/uL (ref 3.87–5.11)
RDW: 14 % (ref 11.5–15.5)
WBC: 12.7 10*3/uL — ABNORMAL HIGH (ref 4.0–10.5)
nRBC: 0 % (ref 0.0–0.2)

## 2023-07-12 MED ORDER — APIXABAN 5 MG PO TABS: 5.0000 mg | ORAL_TABLET | Freq: Once | ORAL | Status: DC

## 2023-07-12 MED ORDER — APIXABAN 5 MG PO TABS
10.0000 mg | ORAL_TABLET | Freq: Once | ORAL | Status: AC
  Administered 2023-07-12: 10 mg via ORAL
  Filled 2023-07-12: qty 2

## 2023-07-12 NOTE — Discharge Instructions (Addendum)
You have been seen today for your complaint of left leg swelling. Your imaging was reassuring and showed no abnormalities. Follow up with: Ultrasound.  I have ordered an ultrasound of your leg to be performed.  You should call the phone number listed to schedule this.  Alternatively, you may present to the emergency department tomorrow prior to 7 PM to have this performed in the emergency department Please seek immediate medical care if you develop any of the following symptoms: You have signs or symptoms that a blood clot has moved to the lungs. These may include: Shortness of breath. Chest pain. Fast or irregular heartbeats (palpitations). Light-headedness, dizziness, or fainting. Coughing up blood. You have signs or symptoms that your blood is too thin. These may include: Blood in your vomit, stool, or urine. A cut that will not stop bleeding. A menstrual period that is heavier than usual. A severe headache or confusion. At this time there does not appear to be the presence of an emergent medical condition, however there is always the potential for conditions to change. Please read and follow the below instructions.  Do not take your medicine if  develop an itchy rash, swelling in your mouth or lips, or difficulty breathing; call 911 and seek immediate emergency medical attention if this occurs.  You may review your lab tests and imaging results in their entirety on your MyChart account.  Please discuss all results of fully with your primary care provider and other specialist at your follow-up visit.  Note: Portions of this text may have been transcribed using voice recognition software. Every effort was made to ensure accuracy; however, inadvertent computerized transcription errors may still be present.

## 2023-07-12 NOTE — ED Provider Notes (Signed)
[facility] AT [facility] Provider Note   CSN: [id number] Arrival date & time: 07/12/23  1951     History  Chief Complaint  Patient presents with   Leg Swelling    Wendy Watts is a 77 y.o. female.  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  Was discontinued off anticoagulation approximately 1 year ago.  Has a history Watts significant Watts which was complicated by recent UTI.  Has been taking antibiotics for this and has been less combative but is still slightly altered.  No recent change to this.  Daughter was assisting the patient with ambulation at home today and noticed that she was having difficulty bearing weight on the left lower extremity.  She then checked her legs and found the left lower extremity to be more swollen than the right.  Unknown history Watts trauma.  HPI     Home Medications Prior to Admission medications   Medication Sig Start Date End Date Taking? Authorizing Provider  ALPRAZolam (XANAX) 0.25 MG tablet Take 0.25 mg by mouth 2 (two) times daily as needed. 08/02/22   [provider]  buPROPion (WELLBUTRIN XL) 150 MG 24 hr tablet Take 150 mg by mouth daily.    [provider]  busPIRone (BUSPAR) 10 MG tablet Take 1 tablet (10 mg total) by mouth 2 (two) times daily. May also take 1 tablet (10 mg total) as needed (for worsening agitation.). 03/08/23   Lomax, Amy, NP  cephALEXin (KEFLEX) 500 MG capsule Take 1 capsule (500 mg total) by mouth every 12 (twelve) hours. 06/30/23   [name], DO  diclofenac Sodium (VOLTAREN) 1 % GEL Apply 2 g topically 4 (four) times daily. 06/30/23   [name], DO  donepezil (ARICEPT) 10 MG tablet TAKE 1 TABLET(10 MG) BY MOUTH AT BEDTIME 03/08/23   Lomax, Amy, NP  memantine (NAMENDA)  10 MG tablet Take 1 tablet (10 mg total) by mouth 2 (two) times daily. 03/08/23   Lomax, Amy, NP      Allergies    Allegra [fexofenadine hcl], Ativan [lorazepam], Azithromycin, Cephalosporins, Cetirizine, Ciprofloxacin, Codeine, Doxycycline hyclate, Fexofenadine, Hydrocodone-acetaminophen, Iodinated contrast media, Lidocaine, Penicillin g, Penicillins, Sertraline, Sulfa antibiotics, Trazodone, and Trazodone and nefazodone    Review Watts Systems   Review Watts Systems  Musculoskeletal:  Positive for joint swelling.  All other systems reviewed and are negative.   Physical Exam Updated Vital Signs BP 110/67   Pulse 80   Temp 97.6 F (36.4 C) (Axillary)   Resp 18   SpO2 98%  Physical Exam Vitals and nursing note reviewed.  Constitutional:      General: She is not in acute distress.    Appearance: Normal appearance. She is normal weight. She is not ill-appearing.     Comments: Resting comfortably in bed  HENT:     Head: Normocephalic and atraumatic.  Pulmonary:     Effort: Pulmonary effort is normal. No respiratory distress.  Abdominal:     General: Abdomen is flat.  Musculoskeletal:        General: Normal range Watts motion.     Cervical back: Neck supple.     Comments: Swelling Watts the left foot, less swelling to the left calf when compared contralaterally.  Minimal overlying erythema.  DP pulses 1+ bilaterally.  Capillary refill  normal.  Bilateral hip strength grossly 5 out Watts 5 in flexion although does not follow commands  Skin:    General: Skin is warm and dry.  Neurological:     Mental Status: She is alert.     Comments: Alert, disoriented, near baseline  Psychiatric:        Mood and Affect: Mood normal.        Behavior: Behavior normal.     ED Results / Procedures / Treatments   Labs (all labs ordered are listed, but only abnormal results are displayed) Labs Reviewed  BASIC METABOLIC PANEL - Abnormal; Notable for the following components:      Result Value   Sodium 134  (*)    Glucose, Bld 105 (*)    Creatinine, Ser 1.27 (*)    GFR, Estimated 44 (*)    All other components within normal limits  CBC WITH DIFFERENTIAL/PLATELET - Abnormal; Notable for the following components:   WBC 12.7 (*)    Neutro Abs 10.6 (*)    All other components within normal limits    EKG None  Radiology DG Ankle Complete Left  Result Date: 07/12/2023 CLINICAL DATA:  Left foot swelling EXAM: LEFT ANKLE COMPLETE - 3+ VIEW COMPARISON:  None Available. FINDINGS: Normal alignment. No acute fracture or dislocation. Ankle mortise is aligned. No ankle effusion. Mild midfoot degenerative arthritis. There is extensive bimalleolar soft tissue swelling. IMPRESSION: 1. Extensive bimalleolar soft tissue swelling. No acute fracture or dislocation. Electronically Signed   By: Helyn Numbers M.D.   On: 07/12/2023 21:45   DG Foot Complete Left  Result Date: 07/12/2023 CLINICAL DATA:  Left foot swelling EXAM: LEFT FOOT - COMPLETE 3+ VIEW COMPARISON:  None Available. FINDINGS: Surgical changes Watts probable bunionectomy are identified with a residual K-wire within the first metatarsal head. Residual moderate hallux valgus deformity. No acute fracture or dislocation. Osseous structures are diffusely osteopenic. Mild degenerate arthritis Watts the first MTP joint. Mild midfoot degenerative arthritis. There is moderate to severe soft tissue swelling Watts the midfoot and hindfoot. Suspected ulcer within the plantar, lateral hindfoot. IMPRESSION: 1. Moderate to severe soft tissue swelling. Possible plantar, hindfoot ulcer. No acute fracture or dislocation. 2. Surgical changes Watts probable bunionectomy. Electronically Signed   By: Helyn Numbers M.D.   On: 07/12/2023 21:44    Procedures Procedures    Medications Ordered in ED Medications - No data to display  ED Course/ Medical Decision Making/ A&P Clinical Course as Watts 07/12/23 2157  Wed Jul 12, 2023  2141 Left leg swelling  Give eliquis and return for  OP DVT. [CC]    Clinical Course User Index [CC] Glyn Ade, MD                                 Medical Decision Making This patient presents to the ED for concern Watts left lower extremity swelling, this involves an extensive number Watts treatment options, and is a complaint that carries with it a high risk Watts complications and morbidity.  The differential diagnosis includes DVT, fracture, cellulitis, strain, sprain  My initial workup includes labs, imaging  Additional history obtained from: Nursing notes from this visit. Family daughter and husband at bedside provide history  I ordered, reviewed and interpreted labs which include: BMP, CBC.  Leukocytosis Watts 12.7.  Mild hyponatremia Watts 134, creatinine elevated to 1.27 with a GFR Watts 44  I ordered imaging studies including x-ray  left foot and ankle I independently visualized and interpreted imaging which showed no acute osseous abnormalities Watts the left foot or ankle, soft tissue swelling present.  Unable to perform DVT study due to timing I agree with the radiologist interpretation  Afebrile, hemodynamically stable.  77 year old female presenting to the emergency department for evaluation Watts left lower extremity swelling.  Unclear exactly when the swelling began due to patient's significant Watts, however daughter and husband at bedside believe this began earlier today.  No known history Watts trauma.  Patient does not contribute to her own history.  At this time there does not appear to be any evidence Watts an acute emergency medical condition and the patient appears stable for discharge with appropriate outpatient follow up. Diagnosis was discussed with patient who verbalizes understanding Watts care plan and is agreeable to discharge. I have discussed return precautions with patient and daughter who verbalizes understanding. Patient encouraged to follow-up with their PCP within 1 week. All questions answered.  Patient's case discussed with  Dr. Doran Durand who agrees with plan to discharge with follow-up.   Note: Portions Watts this report may have been transcribed using voice recognition software. Every effort was made to ensure accuracy; however, inadvertent computerized transcription errors may still be present.        Final Clinical Impression(s) / ED Diagnoses Final diagnoses:  Left leg swelling    Rx / DC Orders ED Discharge Orders          Ordered    LE Venous       Comments: IMPORTANT PATIENT INSTRUCTIONS:  You have been scheduled for an Outpatient Vascular Study at [facility].    If tomorrow is a Saturday, Sunday or holiday, please go to the [facility] Emergency Department Registration Desk at 11 am tomorrow morning and tell them you are there for a vascular study.   If tomorrow is a weekday (Monday-Friday), please go to [facility] Entr[facility] Registration at 11 am and tell them you are there for a vascular study.   07/12/23 2155              Michelle Piper, PA-C 07/12/23 2157    Glyn Ade, MD 07/12/23 2256

## 2023-07-12 NOTE — ED Triage Notes (Signed)
Pt arrived via EMS form home. Pts daughter reports she noticed L leg swelling today. Pts daughter reported to EMS her leg was not swollen 1 week ago. Pt has history of DVT in L leg and was recently taken off anticoagulant. Per EMS pt has history of dementia and recent UTI.

## 2023-07-13 ENCOUNTER — Other Ambulatory Visit: Payer: Self-pay

## 2023-07-13 ENCOUNTER — Ambulatory Visit (HOSPITAL_COMMUNITY)
Admission: RE | Admit: 2023-07-13 | Discharge: 2023-07-13 | Disposition: A | Discharge status: Home / Self Care | Payer: PPO | Source: Ambulatory Visit | Attending: Emergency Medicine | Admitting: Emergency Medicine

## 2023-07-13 ENCOUNTER — Emergency Department (HOSPITAL_COMMUNITY): Payer: PPO

## 2023-07-13 ENCOUNTER — Observation Stay (HOSPITAL_COMMUNITY)
Admission: EM | Admit: 2023-07-13 | Discharge: 2023-07-14 | Disposition: A | Discharge status: Home / Self Care | Payer: PPO | Attending: Internal Medicine | Admitting: Internal Medicine

## 2023-07-13 DIAGNOSIS — Z950 Presence of cardiac pacemaker: Secondary | ICD-10-CM | POA: Diagnosis not present

## 2023-07-13 DIAGNOSIS — F03911 Unspecified dementia, unspecified severity, with agitation: Secondary | ICD-10-CM | POA: Insufficient documentation

## 2023-07-13 DIAGNOSIS — R001 Bradycardia, unspecified: Secondary | ICD-10-CM | POA: Diagnosis not present

## 2023-07-13 DIAGNOSIS — Z96651 Presence of right artificial knee joint: Secondary | ICD-10-CM | POA: Diagnosis not present

## 2023-07-13 DIAGNOSIS — I824Y2 Acute embolism and thrombosis of unspecified deep veins of left proximal lower extremity: Secondary | ICD-10-CM | POA: Diagnosis not present

## 2023-07-13 DIAGNOSIS — R918 Other nonspecific abnormal finding of lung field: Secondary | ICD-10-CM | POA: Diagnosis not present

## 2023-07-13 DIAGNOSIS — M7989 Other specified soft tissue disorders: Secondary | ICD-10-CM | POA: Diagnosis not present

## 2023-07-13 DIAGNOSIS — I2699 Other pulmonary embolism without acute cor pulmonale: Secondary | ICD-10-CM | POA: Diagnosis present

## 2023-07-13 DIAGNOSIS — F03918 Unspecified dementia, unspecified severity, with other behavioral disturbance: Secondary | ICD-10-CM | POA: Diagnosis present

## 2023-07-13 DIAGNOSIS — R2242 Localized swelling, mass and lump, left lower limb: Secondary | ICD-10-CM | POA: Insufficient documentation

## 2023-07-13 DIAGNOSIS — R531 Weakness: Secondary | ICD-10-CM | POA: Diagnosis not present

## 2023-07-13 DIAGNOSIS — R0602 Shortness of breath: Secondary | ICD-10-CM | POA: Diagnosis not present

## 2023-07-13 DIAGNOSIS — F419 Anxiety disorder, unspecified: Secondary | ICD-10-CM | POA: Insufficient documentation

## 2023-07-13 DIAGNOSIS — E041 Nontoxic single thyroid nodule: Secondary | ICD-10-CM | POA: Diagnosis not present

## 2023-07-13 DIAGNOSIS — I82402 Acute embolism and thrombosis of unspecified deep veins of left lower extremity: Principal | ICD-10-CM | POA: Insufficient documentation

## 2023-07-13 DIAGNOSIS — I82492 Acute embolism and thrombosis of other specified deep vein of left lower extremity: Secondary | ICD-10-CM

## 2023-07-13 DIAGNOSIS — I82409 Acute embolism and thrombosis of unspecified deep veins of unspecified lower extremity: Secondary | ICD-10-CM | POA: Diagnosis present

## 2023-07-13 LAB — COMPREHENSIVE METABOLIC PANEL
ALT: 10 U/L (ref 0–44)
AST: 13 U/L — ABNORMAL LOW (ref 15–41)
Albumin: 3 g/dL — ABNORMAL LOW (ref 3.5–5.0)
Alkaline Phosphatase: 88 U/L (ref 38–126)
Anion gap: 11 (ref 5–15)
BUN: 10 mg/dL (ref 8–23)
CO2: 22 mmol/L (ref 22–32)
Calcium: 9.6 mg/dL (ref 8.9–10.3)
Chloride: 105 mmol/L (ref 98–111)
Creatinine, Ser: 1.13 mg/dL — ABNORMAL HIGH (ref 0.44–1.00)
GFR, Estimated: 50 mL/min — ABNORMAL LOW (ref >60)
Glucose, Bld: 91 mg/dL (ref 70–99)
Potassium: 4.2 mmol/L (ref 3.5–5.1)
Sodium: 138 mmol/L (ref 135–145)
Total Bilirubin: 0.9 mg/dL (ref <1.2)

## 2023-07-13 LAB — CBC
HCT: 38.6 % (ref 36.0–46.0)
Hemoglobin: 12.7 g/dL (ref 12.0–15.0)
MCH: 30.2 pg (ref 26.0–34.0)
MCHC: 32.9 g/dL (ref 30.0–36.0)
MCV: 91.9 fL (ref 80.0–100.0)
Platelets: 272 10*3/uL (ref 150–400)
RBC: 4.2 MIL/uL (ref 3.87–5.11)
RDW: 13.9 % (ref 11.5–15.5)
WBC: 9.2 10*3/uL (ref 4.0–10.5)
nRBC: 0 % (ref 0.0–0.2)

## 2023-07-13 LAB — TROPONIN I (HIGH SENSITIVITY): Troponin I (High Sensitivity): 7 ng/L (ref <18)

## 2023-07-13 LAB — BRAIN NATRIURETIC PEPTIDE: B Natriuretic Peptide: 28 pg/mL (ref 0.0–100.0)

## 2023-07-13 MED ORDER — HALOPERIDOL LACTATE 5 MG/ML IJ SOLN
5.0000 mg | Freq: Once | INTRAMUSCULAR | Status: AC
  Administered 2023-07-13: 5 mg via INTRAMUSCULAR
  Filled 2023-07-13: qty 1

## 2023-07-13 MED ORDER — DEXTROSE IN LACTATED RINGERS 5 % IV SOLN: INTRAVENOUS | Status: DC

## 2023-07-13 MED ORDER — IOHEXOL 350 MG/ML SOLN
75.0000 mL | Freq: Once | INTRAVENOUS | Status: AC | PRN
  Administered 2023-07-13: 75 mL via INTRAVENOUS

## 2023-07-13 MED ORDER — ALPRAZOLAM 0.25 MG PO TABS: 0.2500 mg | ORAL_TABLET | Freq: Two times a day (BID) | ORAL | Status: DC | PRN

## 2023-07-13 MED ORDER — APIXABAN 5 MG PO TABS: 5.0000 mg | ORAL_TABLET | Freq: Two times a day (BID) | ORAL | Status: DC

## 2023-07-13 MED ORDER — MEMANTINE HCL 10 MG PO TABS
10.0000 mg | ORAL_TABLET | Freq: Two times a day (BID) | ORAL | Status: DC
  Administered 2023-07-13 – 2023-07-14 (×2): 10 mg via ORAL
  Filled 2023-07-13 (×2): qty 1

## 2023-07-13 MED ORDER — BUSPIRONE HCL 5 MG PO TABS
15.0000 mg | ORAL_TABLET | Freq: Two times a day (BID) | ORAL | Status: DC
  Administered 2023-07-13 – 2023-07-14 (×2): 15 mg via ORAL
  Filled 2023-07-13: qty 1
  Filled 2023-07-13: qty 2

## 2023-07-13 MED ORDER — APIXABAN 5 MG PO TABS
10.0000 mg | ORAL_TABLET | Freq: Two times a day (BID) | ORAL | Status: DC
  Administered 2023-07-13 – 2023-07-14 (×2): 10 mg via ORAL
  Filled 2023-07-13 (×3): qty 2

## 2023-07-13 MED ORDER — DONEPEZIL HCL 10 MG PO TABS
10.0000 mg | ORAL_TABLET | Freq: Every day | ORAL | Status: DC
  Administered 2023-07-13: 10 mg via ORAL
  Filled 2023-07-13: qty 1

## 2023-07-13 MED ORDER — BUPROPION HCL ER (XL) 150 MG PO TB24
150.0000 mg | ORAL_TABLET | Freq: Every day | ORAL | Status: DC
  Administered 2023-07-14: 150 mg via ORAL
  Filled 2023-07-13: qty 1

## 2023-07-13 NOTE — ED Notes (Signed)
Tried to get leads on but pt was fight me and would not let me place. Will try again with RN

## 2023-07-13 NOTE — ED Notes (Signed)
ED TO INPATIENT HANDOFF REPORT  ED Nurse Name and Phone #: [phone]  S Name/Age/Gender Wendy Watts 77 y.o. female Room/Bed: 022C/022C  Code Status   Code Status: Full Code  Home/SNF/Other Home Patient oriented to: self Is this baseline? Yes   Triage Complete: Triage complete  Chief Complaint Generalized weakness [R53.1]  Triage Note Patient seen yesterday for left leg swelling, was instructed to come back today at 1100, to get a doppler study done.   Doppler positive for DVT in LLE.    Allergies Allergies  Allergen Reactions   Allegra [Fexofenadine Hcl] Other (See Comments)    "takes me to another world"   Ativan [Lorazepam] Other (See Comments)    Hallucinations   Azithromycin Nausea And Vomiting and Hives   Cephalosporins     Other Reaction(s): unknown   Cetirizine Other (See Comments) and Nausea And Vomiting    "takes me to another world"   Ciprofloxacin     Other Reaction(s): insomnia and hallucinations   Codeine Hives   Doxycycline Hyclate Nausea Only   Fexofenadine Nausea And Vomiting   Hydrocodone-Acetaminophen     Other Reaction(s): hallucinations   Lidocaine     Other Reaction(s): unknown   Penicillin G Hives   Penicillins Hives    Has patient had a PCN reaction causing immediate rash, facial/tongue/throat swelling, SOB or lightheadedness with hypotension: no Has patient had a PCN reaction causing severe rash involving mucus membranes or skin necrosis: no Has patient had a PCN reaction that required hospitalization: no Has patient had a PCN reaction occurring within the last 10 years: no If all of the above answers are "NO", then may proceed with Cephalosporin use.    Sertraline     Other Reaction(s): hallucinations   Sulfa Antibiotics Hives   Trazodone     Other Reaction(s): Dizzy   Trazodone And Nefazodone Nausea Only    Level of Care/Admitting Diagnosis ED Disposition     ED Disposition  Admit   Condition  --   Comment  Hospital  Area: [facility] [[id number]]  Level of Care: Telemetry Medical [104]  May admit patient to [facility] or [facility] if equivalent level of care is available:: No  Covid Evaluation: Confirmed COVID Negative  Diagnosis: Generalized weakness [295284]  Admitting Physician: Rometta Emery [[id number]]  Attending Physician: Rometta Emery [[id number]]  Certification:: I certify this patient will need inpatient services for at least 2 midnights  Expected Medical Readiness: 07/17/2023          B Medical/Surgery History Past Medical History:  Diagnosis Date   Anxiety    Dementia (HCC)    High cholesterol    Vertigo    Past Surgical History:  Procedure Laterality Date   HYSTERECTOMY     INTRAMEDULLARY (IM) NAIL INTERTROCHANTERIC Right 03/28/2021   Procedure: INTRAMEDULLARY (IM) NAIL INTERTROCHANTRIC;  Surgeon: Cammy Copa, MD;  Location: [facility] ORS;  Service: Orthopedics;  Laterality: Right;   PACEMAKER IMPLANT N/A 05/10/2021   Procedure: PACEMAKER IMPLANT;  Surgeon: Lanier Prude, MD;  Location: [facility] INVASIVE CV LAB;  Service: Cardiovascular;  Laterality: N/A;   PERIPHERAL VASCULAR THROMBECTOMY N/A 05/04/2021   Procedure: PERIPHERAL VASCULAR THROMBECTOMY - VENUS;  Surgeon: Nada Libman, MD;  Location: [facility] INVASIVE CV LAB;  Service: Cardiovascular;  Laterality: N/A;   TOTAL KNEE ARTHROPLASTY Right      A IV Location/Drains/Wounds Patient Lines/Drains/Airways Status     Active Line/Drains/Airways     Name Placement date Placement  time Site Days   Peripheral IV 07/13/23 22 G Anterior;Left Forearm 07/13/23  1714  Forearm  less than 1            Intake/Output Last 24 hours No intake or output data in the 24 hours ending 07/13/23 2245  Labs/Imaging Results for orders placed or performed during the hospital encounter of 07/13/23 (from the past 48 hour(s))  CBC     Status: None   Collection Time: 07/13/23  1:04 PM  Result Value Ref Range   WBC 9.2 4.0 - 10.5  K/uL   RBC 4.20 3.87 - 5.11 MIL/uL   Hemoglobin 12.7 12.0 - 15.0 g/dL   HCT 40.9 81.1 - 91.4 %   MCV 91.9 80.0 - 100.0 fL   MCH 30.2 26.0 - 34.0 pg   MCHC 32.9 30.0 - 36.0 g/dL   RDW 78.2 95.6 - 21.3 %   Platelets 272 150 - 400 K/uL   nRBC 0.0 0.0 - 0.2 %    Comment: Performed at [facility], [address], [place], [place] [address]  Comprehensive metabolic panel     Status: Abnormal   Collection Time: 07/13/23  1:04 PM  Result Value Ref Range   Sodium 138 135 - 145 mmol/L   Potassium 4.2 3.5 - 5.1 mmol/L   Chloride 105 98 - 111 mmol/L   CO2 22 22 - 32 mmol/L   Glucose, Bld 91 70 - 99 mg/dL    Comment: Glucose reference range applies only to samples taken after fasting for at least 8 hours.   BUN 10 8 - 23 mg/dL   Creatinine, Ser 8.46 (H) 0.44 - 1.00 mg/dL   Calcium 9.6 8.9 - 96.2 mg/dL   Total Protein RESULTS UNAVAILABLE DUE TO INTERFERING SUBSTANCE 6.5 - 8.1 g/dL   Albumin 3.0 (L) 3.5 - 5.0 g/dL   AST 13 (L) 15 - 41 U/L   ALT 10 0 - 44 U/L   Alkaline Phosphatase 88 38 - 126 U/L   Total Bilirubin 0.9 <1.2 mg/dL   GFR, Estimated 50 (L) >60 mL/min    Comment: (NOTE) Calculated using the CKD-EPI Creatinine Equation (2021)    Anion gap 11 5 - 15    Comment: Performed at [facility], [address], [place], [place] [address]  Troponin I (High Sensitivity)     Status: None   Collection Time: 07/13/23  1:04 PM  Result Value Ref Range   Troponin I (High Sensitivity) 7 <18 ng/L    Comment: (NOTE) Elevated high sensitivity troponin I (hsTnI) values and significant  changes across serial measurements may suggest ACS but many other  chronic and acute conditions are known to elevate hsTnI results.  Refer to the "Links" section for chest pain algorithms and additional  guidance. Performed at [facility], [address], [place], [place] [address]   Brain natriuretic peptide     Status: None   Collection Time: 07/13/23  5:05 PM  Result Value Ref Range   B  Natriuretic Peptide 28.0 0.0 - 100.0 pg/mL    Comment: Performed at [facility], [address], [place], [place] [address]   CT Angio Chest PE W and/or Wo Contrast  Result Date: 07/13/2023 CLINICAL DATA:  Left leg swelling. Left lower extremity DVT. Pulmonary embolism suspected, high probability. EXAM: CT ANGIOGRAPHY CHEST WITH CONTRAST TECHNIQUE: Multidetector CT imaging of the chest was performed using the standard protocol during bolus administration of intravenous contrast. Multiplanar  CT image reconstructions and MIPs were obtained to evaluate the vascular anatomy. RADIATION DOSE REDUCTION: This exam was performed according to the departmental dose-optimization program which includes automated exposure control, adjustment of the mA and/or kV according to patient size and/or use of iterative reconstruction technique. CONTRAST:  75mL OMNIPAQUE IOHEXOL 350 MG/ML SOLN COMPARISON:  One-view chest x-ray 06/26/2023 FINDINGS: Cardiovascular: The heart size is normal. The aortic arch and great vessel origins are within normal limits. No dissection is present. Pulmonary artery opacification is excellent. Filling defects are present within segmental branches of the left lower lobe. Pulmonary artery size is normal. There is no evidence for right heart strain. Mediastinum/Nodes: No enlarged mediastinal, hilar, or axillary lymph nodes. Thyroid gland, trachea, and esophagus demonstrate no significant findings. Heterogeneous right lobe of the thyroid is present. A dominant nodule measures 3.1 x 2.0 cm. Lungs/Pleura: Mild dependent atelectasis is present both lungs. A 3 mm nodule is present the left lung apex on image 33 of series 6. No other nodule or mass lesion is present. No significant pleural effusion or pneumothorax is present. Upper Abdomen: Limited imaging the abdomen is unremarkable. There is no significant adenopathy. No solid organ lesions are present. Musculoskeletal: No chest wall abnormality. No  acute or significant osseous findings. Review of the MIP images confirms the above findings. IMPRESSION: 1. Pulmonary emboli within segmental branches of the left lower lobe. 2. No evidence for right heart strain. 3. 3 mm nodule at the left lung apex. No follow-up needed if patient is low-risk.This recommendation follows the consensus statement: Guidelines for Management of Incidental Pulmonary Nodules Detected on CT Images: From the Fleischner Society 2017; Radiology 2017; 284:228-243. 4. Heterogeneous right lobe of the thyroid with a dominant nodule measuring 3.1 x 2.0 cm. Recommend thyroid US (ref: J Am Coll Radiol. 2015 Feb;12(2): 143-50). These results were called by telephone at the time of interpretation on 07/13/2023 at 6:17 pm to provider Dr. Doran Durand, who verbally acknowledged these results. Electronically Signed   By: Marin Roberts M.D.   On: 07/13/2023 18:18   LE Venous  Result Date: 07/13/2023  Lower Venous DVT Study Patient Name:  Wendy Watts  Date of Exam:   07/13/2023 Medical Rec #: [id number]      Accession #:    [id number] Date of Birth: 1945-12-03       Patient Gender: F Patient Age:   22 years Exam Location:  [facility] Procedure:      VAS [name] LOWER EXTREMITY VENOUS (DVT) Referring Phys: Lyn Hollingshead SCHUTT --------------------------------------------------------------------------------  Indications: Pain, and Swelling.  Risk Factors: DVT 04/27/21 extensive Left DVT in the CFV, FV, SFJ, FV, PFV, Popliteal, peroneal, posterial tibial, gastroc and SVT in the proximal GSV. Placed on Eliquis at time of DVT, however, has not been on anticoagulants in a year. . Limitations: Patient compliance/dementia. Comparison Study: Prior Left LEV 04/27/21 Performing Technologist: Sherren Kerns RVS  Examination Guidelines: A complete evaluation includes B-mode imaging, spectral Doppler, color Doppler, and power Doppler as needed of all accessible portions of each vessel. Bilateral testing is  considered an integral part of a complete examination. Limited examinations for reoccurring indications may be performed as noted. The reflux portion of the exam is performed with the patient in reverse Trendelenburg.  +-----+---------------+---------+-----------+----------+--------------+ RIGHTCompressibilityPhasicitySpontaneityPropertiesThrombus Aging +-----+---------------+---------+-----------+----------+--------------+ CFV  Full           Yes      Yes                                 +-----+---------------+---------+-----------+----------+--------------+   +---------+---------------+---------+-----------+----------+------------------+  LEFT     CompressibilityPhasicitySpontaneityPropertiesThrombus Aging     +---------+---------------+---------+-----------+----------+------------------+ CFV      None           No       No                   Acute              +---------+---------------+---------+-----------+----------+------------------+ SFJ      None           No       No                   Acute              +---------+---------------+---------+-----------+----------+------------------+ FV Prox  None           No       No                   Age Indeterminate  +---------+---------------+---------+-----------+----------+------------------+ FV Mid   None           No       No                   Age Indeterminate  +---------+---------------+---------+-----------+----------+------------------+ FV DistalNone           No       No                   Age Indeterminate  +---------+---------------+---------+-----------+----------+------------------+ PFV      None           No       No                   Age Indeterminate  +---------+---------------+---------+-----------+----------+------------------+ POP      None           No       No                   Age Indeterminate  +---------+---------------+---------+-----------+----------+------------------+ PTV       None           No       No                   Age Indeterminate  +---------+---------------+---------+-----------+----------+------------------+ PERO     None           No       No                   Age Indeterminate  +---------+---------------+---------+-----------+----------+------------------+ Gastroc                                               Not well                                                                 visualized         +---------+---------------+---------+-----------+----------+------------------+ GSV      None           No       No  acute proximal                                                           portion            +---------+---------------+---------+-----------+----------+------------------+ EIV                     No       No                   Acute              +---------+---------------+---------+-----------+----------+------------------+ Unable to visualize CIV or IVC secondary to patient's altered mental status    Summary: LEFT: - Findings consistent with acute deep vein thrombosis involving the left common femoral vein, SF junction, and EIV. - Findings consistent with acute superficial vein thrombosis involving the proximal left great saphenous vein.  - Findings consistent with age indeterminate deep vein thrombosis involving the left femoral vein, left proximal profunda vein, left popliteal vein, left posterior tibial veins, and left peroneal veins.  - No cystic structure found in the popliteal fossa.  RIGHT: - No evidence of common femoral vein obstruction.   *See table(s) above for measurements and observations. Electronically signed by Sherald Hess MD on 07/13/2023 at 12:37:25 PM.    Final    DG Ankle Complete Left  Result Date: 07/12/2023 CLINICAL DATA:  Left foot swelling EXAM: LEFT ANKLE COMPLETE - 3+ VIEW COMPARISON:  None Available. FINDINGS: Normal alignment. No acute fracture or dislocation.  Ankle mortise is aligned. No ankle effusion. Mild midfoot degenerative arthritis. There is extensive bimalleolar soft tissue swelling. IMPRESSION: 1. Extensive bimalleolar soft tissue swelling. No acute fracture or dislocation. Electronically Signed   By: Helyn Numbers M.D.   On: 07/12/2023 21:45   DG Foot Complete Left  Result Date: 07/12/2023 CLINICAL DATA:  Left foot swelling EXAM: LEFT FOOT - COMPLETE 3+ VIEW COMPARISON:  None Available. FINDINGS: Surgical changes of probable bunionectomy are identified with a residual K-wire within the first metatarsal head. Residual moderate hallux valgus deformity. No acute fracture or dislocation. Osseous structures are diffusely osteopenic. Mild degenerate arthritis of the first MTP joint. Mild midfoot degenerative arthritis. There is moderate to severe soft tissue swelling of the midfoot and hindfoot. Suspected ulcer within the plantar, lateral hindfoot. IMPRESSION: 1. Moderate to severe soft tissue swelling. Possible plantar, hindfoot ulcer. No acute fracture or dislocation. 2. Surgical changes of probable bunionectomy. Electronically Signed   By: Helyn Numbers M.D.   On: 07/12/2023 21:44    Pending Labs Unresulted Labs (From admission, onward)     Start     Ordered   07/14/23 0500  Comprehensive metabolic panel  Tomorrow morning,   R        07/13/23 2223   07/14/23 0500  CBC  Tomorrow morning,   R        07/13/23 2223            Vitals/Pain Today's Vitals   07/13/23 1945 07/13/23 1947 07/13/23 2210 07/13/23 2210  BP: 118/62  102/67   Pulse: 77  64   Resp: 14  16   Temp:  97.7 F (36.5 C)    TempSrc:  Axillary    SpO2: 100%  100%   PainSc:  0-No pain    Isolation Precautions No active isolations  Medications Medications  apixaban (ELIQUIS) tablet 10 mg (10 mg Oral Given 07/13/23 1943)    Followed by  apixaban (ELIQUIS) tablet 5 mg (has no administration in time range)  buPROPion (WELLBUTRIN XL) 24 hr tablet 150 mg (has no  administration in time range)  ALPRAZolam (XANAX) tablet 0.25 mg (has no administration in time range)  memantine (NAMENDA) tablet 10 mg (has no administration in time range)  donepezil (ARICEPT) tablet 10 mg (has no administration in time range)  busPIRone (BUSPAR) tablet 15 mg (has no administration in time range)  dextrose 5 % in lactated ringers infusion (has no administration in time range)  haloperidol lactate (HALDOL) injection 5 mg (5 mg Intramuscular Given 07/13/23 1526)  iohexol (OMNIPAQUE) 350 MG/ML injection 75 mL (75 mLs Intravenous Contrast Given 07/13/23 1806)    Mobility walks with person assist     Focused Assessments Pulmonary Assessment Handoff:  Lung sounds:   O2 Device: Room Air      R Recommendations: See Admitting Provider Note  Report given to:   Additional Notes: Patient requires full assistance with feeding/meals, she will not feed herself, requires assistance with ambulating, and she is currently unaware when she is soiled, would prefer to pick up her medications out of your hand for medications administration, do not place pills in her mouth. Please call with any additional questions.

## 2023-07-13 NOTE — ED Triage Notes (Signed)
Patient seen yesterday for left leg swelling, was instructed to come back today at 1100, to get a doppler study done.   Doppler positive for DVT in LLE.

## 2023-07-13 NOTE — H&P (Signed)
History and Physical    Patient: Wendy Watts NFA:[id number] DOB: [birth year] DOA: 07/13/2023 DOS: the patient was seen and examined on 07/13/2023 PCP: Daisy Floro, MD  Patient coming from: Home  Chief Complaint:  Chief Complaint  Patient presents with   DVT   HPI: Wendy Watts is a 77 y.o. female with medical history significant of dementia, prior DVT, history of previous hip fracture, anxiety disorder, hyperlipidemia and vertigo who was brought in from home with left lower extremity swelling and inability to put weight on her leg.  Patient was seen last night in the ER with diagnosis of DVT.  Started on Eliquis which she is to has so far tolerated.  Patient was being prepped to go home where she is recently had home health set up but has not began coming out.  She however could not put adequate weight on the leg and she was also becoming short of breath.  CT angiogram of the chest showed pulmonary embolism but no right heart strain.  Patient has been to a skilled facility twice before.  At this point there is fears of fall as patient was unable to walk.  With her dementia she is also at high risk for fall.  She is being admitted to the hospital for PT OT evaluation and possible placement.  Review of Systems: As mentioned in the history of present illness. All other systems reviewed and are negative. Past Medical History:  Diagnosis Date   Anxiety    Dementia (HCC)    High cholesterol    Vertigo    Past Surgical History:  Procedure Laterality Date   HYSTERECTOMY     INTRAMEDULLARY (IM) NAIL INTERTROCHANTERIC Right 03/28/2021   Procedure: INTRAMEDULLARY (IM) NAIL INTERTROCHANTRIC;  Surgeon: Cammy Copa, MD;  Location: [facility];  Service: Orthopedics;  Laterality: Right;   PACEMAKER IMPLANT N/A 05/10/2021   Procedure: PACEMAKER IMPLANT;  Surgeon: Lanier Prude, MD;  Location: [facility] INVASIVE CV LAB;  Service: Cardiovascular;  Laterality: N/A;   PERIPHERAL VASCULAR  THROMBECTOMY N/A 05/04/2021   Procedure: PERIPHERAL VASCULAR THROMBECTOMY - VENUS;  Surgeon: Nada Libman, MD;  Location: [facility] INVASIVE CV LAB;  Service: Cardiovascular;  Laterality: N/A;   TOTAL KNEE ARTHROPLASTY Right    Social History:  reports that she has never smoked. She has never used smokeless tobacco. She reports that she does not drink alcohol and does not use drugs.  Allergies  Allergen Reactions   Allegra [Fexofenadine Hcl] Other (See Comments)    "takes me to another world"   Ativan [Lorazepam] Other (See Comments)    Hallucinations   Azithromycin Nausea And Vomiting and Hives   Cephalosporins     Other Reaction(s): unknown   Cetirizine Other (See Comments) and Nausea And Vomiting    "takes me to another world"   Ciprofloxacin     Other Reaction(s): insomnia and hallucinations   Codeine Hives   Doxycycline Hyclate Nausea Only   Fexofenadine Nausea And Vomiting   Hydrocodone-Acetaminophen     Other Reaction(s): hallucinations   Lidocaine     Other Reaction(s): unknown   Penicillin G Hives   Penicillins Hives    Has patient had a PCN reaction causing immediate rash, facial/tongue/throat swelling, SOB or lightheadedness with hypotension: no Has patient had a PCN reaction causing severe rash involving mucus membranes or skin necrosis: no Has patient had a PCN reaction that required hospitalization: no Has patient had a PCN reaction occurring within the last 10 years: no  If all of the above answers are "NO", then may proceed with Cephalosporin use.    Sertraline     Other Reaction(s): hallucinations   Sulfa Antibiotics Hives   Trazodone     Other Reaction(s): Dizzy   Trazodone And Nefazodone Nausea Only    Family History  Problem Relation Age of Onset   Dementia Neg Hx    Alzheimer's disease Neg Hx     Prior to Admission medications   Medication Sig Start Date End Date Taking? Authorizing Provider  ALPRAZolam (XANAX) 0.25 MG tablet Take 0.25 mg by mouth 2  (two) times daily as needed. 08/02/22  Yes [provider]  buPROPion (WELLBUTRIN XL) 150 MG 24 hr tablet Take 150 mg by mouth daily.   Yes [provider]  busPIRone (BUSPAR) 15 MG tablet Take 15 mg by mouth 2 (two) times daily. 07/01/23  Yes [provider]  donepezil (ARICEPT) 10 MG tablet TAKE 1 TABLET(10 MG) BY MOUTH AT BEDTIME 03/08/23  Yes Lomax, Amy, NP  memantine (NAMENDA) 10 MG tablet Take 1 tablet (10 mg total) by mouth 2 (two) times daily. 03/08/23  Yes Lomax, Amy, NP    Physical Exam: Vitals:   07/13/23 1546 07/13/23 1700 07/13/23 1900 07/13/23 1947  BP: 111/61 101/68 105/63   Pulse: 81 98 80   Resp: 17 18 (!) 22   Temp:    97.7 F (36.5 C)  TempSrc:    Axillary  SpO2: 100% 100% 100%    Constitutional: Confused, weak NAD, calm, comfortable Eyes: PERRL, lids and conjunctivae normal ENMT: Mucous membranes are moist. Posterior pharynx clear of any exudate or lesions.Normal dentition.  Neck: normal, supple, no masses, no thyromegaly Respiratory: clear to auscultation bilaterally, no wheezing, no crackles. Normal respiratory effort. No accessory muscle use.  Cardiovascular: Regular rate and rhythm, no murmurs / rubs / gallops. No extremity edema. 2+ pedal pulses. No carotid bruits.  Abdomen: no tenderness, no masses palpated. No hepatosplenomegaly. Bowel sounds positive.  Musculoskeletal: Good range of motion, left lower extremity swelling and tenderness Skin: no rashes, lesions, ulcers. No induration Neurologic: CN 2-12 grossly intact. Sensation intact, DTR normal. Strength 5/5 in all 4.  Psychiatric: Confused, no agitation. Normal mood  Data Reviewed:  Vitals are stable, creatinine 1.13, albumin 3.0 AST 13, CBC all within normal.  Lower extremity Doppler ultrasound is positive for DVT this involved left common femoral vein SF junction and EIV also superficial vein thrombosis involving the proximal left greater saphenous vein also age-indeterminate  thrombosis with the left femoral vein and left proximal profunda vein left popliteal vein left posterior tibial veins and left peroneal veins.  CT angiogram of the chest shows pulmonary emboli within segmental branch of the left lower lobe no evidence of right heart strain there is a 3 mm nodule at the left lung apex.  Assessment and Plan:  #1 acute pulmonary embolism: Involving the left lung.  Patient has been initiated on Eliquis since yesterday.  She has extensive left lower extremity DVT leading to the swelling of the lower extremity.  At this point we will continue with the Eliquis she has tolerated it well.  This is her second episode of DVT on patient may therefore need to be on anticoagulation for life.  #2 extensive left lower extremity DVT: Continue Eliquis.  #3 generalized weakness and inability to walk: Secondary to DVT most likely.  Also her advanced dementia.  PT evaluation in the morning and disposition after that.  #4 dementia: Advanced.  Will continue with Namenda and Aricept.  #5 anxiety disorder: Continue alprazolam and BuSpar patient also on Wellbutrin.  Continue monitor    Advance Care Planning:   Code Status: Prior full code  Consults: None  Family Communication: Daughter at bedside  Severity of Illness: The appropriate patient status for this patient is INPATIENT. Inpatient status is judged to be reasonable and necessary in order to provide the required intensity of service to ensure the patient's safety. The patient's presenting symptoms, physical exam findings, and initial radiographic and laboratory data in the context of their chronic comorbidities is felt to place them at high risk for further clinical deterioration. Furthermore, it is not anticipated that the patient will be medically stable for discharge from the hospital within 2 midnights of admission.   * I certify that at the point of admission it is my clinical judgment that the patient will require  inpatient hospital care spanning beyond 2 midnights from the point of admission due to high intensity of service, high risk for further deterioration and high frequency of surveillance required.*  AuthorLonia Blood, MD 07/13/2023 8:28 PM  For on call review [email or web address].

## 2023-07-13 NOTE — ED Provider Notes (Signed)
  Physical Exam  BP 102/67 (BP Location: Right Arm)   Pulse 64   Temp 97.7 F (36.5 C) (Axillary) Comment (Src): patient refused oral temp, Dr. Jean Rosenthal notified  Resp 16   SpO2 100%   Physical Exam  Procedures  Procedures  ED Course / MDM   Clinical Course as of 07/13/23 2256  Thu Jul 13, 2023  1525 S- hx LLE DVT, dementia. Now having increasing dyspnea. Can't an IV, getting haldol for agitation -Fu workup [HJ]    Clinical Course User Index [HJ] Janyth Pupa, MD   Medical Decision Making Amount and/or Complexity of Data Reviewed Labs: ordered. Radiology: ordered.  Risk Prescription drug management. Decision regarding hospitalization.   CBC with no leukocytosis or anemia.  Metabolic panel with no gross metabolic or electrolyte abnormality.  BNP 28, troponin 7.  CTA PE with left lower lobe segmental pulmonary emboli.  Patient was seen in the ED last night and told to return in the morning for DVT ultrasound.  She was empirically started on Eliquis at that time.  Patient discussed with the hospitalist service initially, who recommended management at home given her low risk PESI score.  I talked with the daughter extensively, who reported that the patient seems like her left leg has been hurting her while walking.  I attempted to ambulate the patient, upon standing, she seemed to immediately fall back onto her buttocks on the bed.  She did not injure anything or hit her head during this episode. I had an extensive discussion with the daughter and we agreed that admission to the hospital is in the best interest of the patient.  At that point, hospitalist service was reengaged and they have agreed to admit the patient.       Janyth Pupa, MD 07/13/23 2256    Glyn Ade, MD 07/14/23 [id number]

## 2023-07-13 NOTE — ED Provider Notes (Signed)
[place] EMERGENCY DEPARTMENT AT [facility] Provider Note   CSN: [id number] Arrival date & time: 07/13/23  1159     History  Chief Complaint  Patient presents with   DVT    Wendy Watts is a 77 y.o. female.  77 year old female with a past medical history of dementia prior DVT presents here after she was found to have DVT on left lower extremity ultrasound performed earlier today.  Patient was seen in the ED yesterday.  Unfortunately, ultrasound was not available at that time.  Patient returned today for imaging and was subsequently found to have a left lower extremity DVT.  On interview, the patient's daughter provides the history as patient has significant cognitive deficits secondary to her dementia.  The patient's daughter notes that she has had more dyspnea on exertion over the last several days.  This is not typical for her.  Patient has not been complaining of any chest pain.  The history is provided by a caregiver. The history is limited by the condition of the patient.       Home Medications Prior to Admission medications   Medication Sig Start Date End Date Taking? Authorizing Provider  busPIRone (BUSPAR) 15 MG tablet Take 15 mg by mouth 2 (two) times daily. 07/01/23  Yes [provider]  ALPRAZolam (XANAX) 0.25 MG tablet Take 0.25 mg by mouth 2 (two) times daily as needed. 08/02/22   [provider]  buPROPion (WELLBUTRIN XL) 150 MG 24 hr tablet Take 150 mg by mouth daily.    [provider]  busPIRone (BUSPAR) 10 MG tablet Take 1 tablet (10 mg total) by mouth 2 (two) times daily. May also take 1 tablet (10 mg total) as needed (for worsening agitation.). 03/08/23   Lomax, Amy, NP  cephALEXin (KEFLEX) 500 MG capsule Take 1 capsule (500 mg total) by mouth every 12 (twelve) hours. 06/30/23   [name], DO  diclofenac Sodium (VOLTAREN) 1 % GEL Apply 2 g topically 4 (four) times daily. 06/30/23   [name], DO  donepezil  (ARICEPT) 10 MG tablet TAKE 1 TABLET(10 MG) BY MOUTH AT BEDTIME 03/08/23   Lomax, Amy, NP  memantine (NAMENDA) 10 MG tablet Take 1 tablet (10 mg total) by mouth 2 (two) times daily. 03/08/23   Lomax, Amy, NP      Allergies    Allegra [fexofenadine hcl], Ativan [lorazepam], Azithromycin, Cephalosporins, Cetirizine, Ciprofloxacin, Codeine, Doxycycline hyclate, Fexofenadine, Hydrocodone-acetaminophen, Iodinated contrast media, Lidocaine, Penicillin g, Penicillins, Sertraline, Sulfa antibiotics, Trazodone, and Trazodone and nefazodone    Review of Systems   As noted in HPI  Physical Exam Updated Vital Signs BP (!) 103/46 (BP Location: Right Arm)   Pulse 77   Resp 18   SpO2 100%  Physical Exam Vitals reviewed.  Constitutional:      General: She is not in acute distress.    Appearance: She is normal weight. She is not ill-appearing, toxic-appearing or diaphoretic.  Cardiovascular:     Rate and Rhythm: Normal rate and regular rhythm.     Pulses: Normal pulses.          Radial pulses are 2+ on the right side and 2+ on the left side.       Dorsalis pedis pulses are 2+ on the right side and 2+ on the left side.     Heart sounds: Normal heart sounds. No murmur heard.    No friction rub. No gallop.  Pulmonary:     Effort:  Pulmonary effort is normal. No respiratory distress.     Breath sounds: Normal breath sounds. No wheezing, rhonchi or rales.  Abdominal:     General: There is no distension.     Palpations: Abdomen is soft.     Tenderness: There is no abdominal tenderness. There is no guarding or rebound.  Musculoskeletal:     Comments: Distal left lower extremity larger when compared to right.  Skin:    General: Skin is warm and dry.  Neurological:     Mental Status: She is alert.     ED Results / Procedures / Treatments   Labs (all labs ordered are listed, but only abnormal results are displayed) Labs Reviewed - No data to display  EKG None  Radiology LE Venous  Result  Date: 07/13/2023  Lower Venous DVT Study Patient Name:  Wendy Watts  Date of Exam:   07/13/2023 Medical Rec #: [id number]      Accession #:    [id number] Date of Birth: [birth year]       Patient Gender: F Patient Age:   42 years Exam Location:  [facility] Procedure:      VAS [name] LOWER EXTREMITY VENOUS (DVT) Referring Phys: Lyn Hollingshead SCHUTT --------------------------------------------------------------------------------  Indications: Pain, and Swelling.  Risk Factors: DVT 04/27/21 extensive Left DVT in the CFV, FV, SFJ, FV, PFV, Popliteal, peroneal, posterial tibial, gastroc and SVT in the proximal GSV. Placed on Eliquis at time of DVT, however, has not been on anticoagulants in a year. . Limitations: Patient compliance/dementia. Comparison Study: Prior Left LEV 04/27/21 Performing Technologist: Sherren Kerns RVS  Examination Guidelines: A complete evaluation includes B-mode imaging, spectral Doppler, color Doppler, and power Doppler as needed of all accessible portions of each vessel. Bilateral testing is considered an integral part of a complete examination. Limited examinations for reoccurring indications may be performed as noted. The reflux portion of the exam is performed with the patient in reverse Trendelenburg.  +-----+---------------+---------+-----------+----------+--------------+ RIGHTCompressibilityPhasicitySpontaneityPropertiesThrombus Aging +-----+---------------+---------+-----------+----------+--------------+ CFV  Full           Yes      Yes                                 +-----+---------------+---------+-----------+----------+--------------+   +---------+---------------+---------+-----------+----------+------------------+ LEFT     CompressibilityPhasicitySpontaneityPropertiesThrombus Aging     +---------+---------------+---------+-----------+----------+------------------+ CFV      None           No       No                   Acute               +---------+---------------+---------+-----------+----------+------------------+ SFJ      None           No       No                   Acute              +---------+---------------+---------+-----------+----------+------------------+ FV Prox  None           No       No                   Age Indeterminate  +---------+---------------+---------+-----------+----------+------------------+ FV Mid   None           No       No  Age Indeterminate  +---------+---------------+---------+-----------+----------+------------------+ FV DistalNone           No       No                   Age Indeterminate  +---------+---------------+---------+-----------+----------+------------------+ PFV      None           No       No                   Age Indeterminate  +---------+---------------+---------+-----------+----------+------------------+ POP      None           No       No                   Age Indeterminate  +---------+---------------+---------+-----------+----------+------------------+ PTV      None           No       No                   Age Indeterminate  +---------+---------------+---------+-----------+----------+------------------+ PERO     None           No       No                   Age Indeterminate  +---------+---------------+---------+-----------+----------+------------------+ Gastroc                                               Not well                                                                 visualized         +---------+---------------+---------+-----------+----------+------------------+ GSV      None           No       No                   acute proximal                                                           portion            +---------+---------------+---------+-----------+----------+------------------+ EIV                     No       No                   Acute               +---------+---------------+---------+-----------+----------+------------------+ Unable to visualize CIV or IVC secondary to patient's altered mental status    Summary: LEFT: - Findings consistent with acute deep vein thrombosis involving the left common femoral vein, SF junction, and EIV. - Findings consistent with acute superficial vein thrombosis involving the proximal left great saphenous vein.  - Findings consistent with age indeterminate deep vein thrombosis involving the left femoral vein,  left proximal profunda vein, left popliteal vein, left posterior tibial veins, and left peroneal veins.  - No cystic structure found in the popliteal fossa.  RIGHT: - No evidence of common femoral vein obstruction.   *See table(s) above for measurements and observations. Electronically signed by Sherald Hess MD on 07/13/2023 at 12:37:25 PM.    Final    DG Ankle Complete Left  Result Date: 07/12/2023 CLINICAL DATA:  Left foot swelling EXAM: LEFT ANKLE COMPLETE - 3+ VIEW COMPARISON:  None Available. FINDINGS: Normal alignment. No acute fracture or dislocation. Ankle mortise is aligned. No ankle effusion. Mild midfoot degenerative arthritis. There is extensive bimalleolar soft tissue swelling. IMPRESSION: 1. Extensive bimalleolar soft tissue swelling. No acute fracture or dislocation. Electronically Signed   By: Helyn Numbers M.D.   On: 07/12/2023 21:45   DG Foot Complete Left  Result Date: 07/12/2023 CLINICAL DATA:  Left foot swelling EXAM: LEFT FOOT - COMPLETE 3+ VIEW COMPARISON:  None Available. FINDINGS: Surgical changes of probable bunionectomy are identified with a residual K-wire within the first metatarsal head. Residual moderate hallux valgus deformity. No acute fracture or dislocation. Osseous structures are diffusely osteopenic. Mild degenerate arthritis of the first MTP joint. Mild midfoot degenerative arthritis. There is moderate to severe soft tissue swelling of the midfoot and hindfoot. Suspected  ulcer within the plantar, lateral hindfoot. IMPRESSION: 1. Moderate to severe soft tissue swelling. Possible plantar, hindfoot ulcer. No acute fracture or dislocation. 2. Surgical changes of probable bunionectomy. Electronically Signed   By: Helyn Numbers M.D.   On: 07/12/2023 21:44    Procedures Procedures    Medications Ordered in ED Medications - No data to display  ED Course/ Medical Decision Making/ A&P Clinical Course as of 07/13/23 1658  Thu Jul 13, 2023  1525 S- hx LLE DVT, dementia. Now having increasing dyspnea. Can't an IV, getting haldol for agitation -Fu workup [HJ]    Clinical Course User Index [HJ] Janyth Pupa, MD                                 Medical Decision Making Amount and/or Complexity of Data Reviewed Independent Historian: caregiver Labs: ordered. Radiology: ordered. ECG/medicine tests: ordered and independent interpretation performed.  Risk Prescription drug management.   77 year old female presents here after she was found to have a DVT on lower extremity ultrasound performed this morning.  Vitals reassuring.  On exam, patient is in no acute distress.  She has 2+ peripheral pulses in bilateral upper and lower extremities.  Her left lower extremity is larger when compared to her right.  Patient's daughter states that she has become more short of breath with exertion over the last few days.  At baseline, patient has minimal ability to communicate secondary to her advanced dementia.  She is unable to provide any history.  However, has not been complaining of chest pain, only leg pain to her daughter.  Initial differential diagnosis includes pulmonary embolism, acute heart failure, electrolyte abnormality, anemia.  Will plan to obtain CTA chest to evaluate for PE.  Patient got a single dose of Eliquis yesterday.  However, if she has significant clot burden, she may benefit from hospital admission for IV heparin and thrombectomy.  Also obtain laboratory  workup, including CMP, CBC, BNP, troponins.  I independently reviewed the patient's ECG. Regular rhythm, appears sinus.  T wave inversions noted in anterior and septal leads.  Poor quality study.  Difficult determine  whether there are ST segment changes.  I was notified by nursing staff that patient was agitated.  They were unable to obtain IV access.  5 mg IM Haldol given to facilitate obtaining workup.  Care of this patient was transferred to Dr. Jean Rosenthal at the conclusion of my shift.  Please see her note for remainder of ED course and ultimate disposition.  Patient's presentation is most consistent with acute presentation with potential threat to life or bodily function.         Final Clinical Impression(s) / ED Diagnoses Final diagnoses:  Acute deep vein thrombosis (DVT) of left lower extremity, unspecified vein (HCC)    Rx / DC Orders ED Discharge Orders     None         Rolla Flatten, MD 07/13/23 1709    Benjiman Core, MD 07/14/23 1444

## 2023-07-14 ENCOUNTER — Encounter (HOSPITAL_COMMUNITY): Payer: Self-pay | Admitting: Internal Medicine

## 2023-07-14 ENCOUNTER — Other Ambulatory Visit (HOSPITAL_COMMUNITY): Payer: Self-pay

## 2023-07-14 DIAGNOSIS — I2699 Other pulmonary embolism without acute cor pulmonale: Secondary | ICD-10-CM | POA: Diagnosis not present

## 2023-07-14 DIAGNOSIS — R531 Weakness: Secondary | ICD-10-CM

## 2023-07-14 LAB — COMPREHENSIVE METABOLIC PANEL
ALT: 9 U/L (ref 0–44)
AST: 12 U/L — ABNORMAL LOW (ref 15–41)
Albumin: 2.6 g/dL — ABNORMAL LOW (ref 3.5–5.0)
Alkaline Phosphatase: 82 U/L (ref 38–126)
Anion gap: 9 (ref 5–15)
BUN: 9 mg/dL (ref 8–23)
CO2: 23 mmol/L (ref 22–32)
Calcium: 9.4 mg/dL (ref 8.9–10.3)
Chloride: 103 mmol/L (ref 98–111)
Creatinine, Ser: 1.14 mg/dL — ABNORMAL HIGH (ref 0.44–1.00)
GFR, Estimated: 50 mL/min — ABNORMAL LOW (ref >60)
Glucose, Bld: 87 mg/dL (ref 70–99)
Potassium: 3.6 mmol/L (ref 3.5–5.1)
Sodium: 135 mmol/L (ref 135–145)
Total Bilirubin: 0.8 mg/dL (ref <1.2)

## 2023-07-14 LAB — CBC
HCT: 36.5 % (ref 36.0–46.0)
Hemoglobin: 12.1 g/dL (ref 12.0–15.0)
MCH: 30.1 pg (ref 26.0–34.0)
MCHC: 33.2 g/dL (ref 30.0–36.0)
MCV: 90.8 fL (ref 80.0–100.0)
Platelets: 239 10*3/uL (ref 150–400)
RBC: 4.02 MIL/uL (ref 3.87–5.11)
RDW: 14 % (ref 11.5–15.5)
WBC: 7.3 10*3/uL (ref 4.0–10.5)
nRBC: 0 % (ref 0.0–0.2)

## 2023-07-14 LAB — TROPONIN I (HIGH SENSITIVITY): Troponin I (High Sensitivity): 7 ng/L (ref <18)

## 2023-07-14 MED ORDER — APIXABAN (ELIQUIS) VTE STARTER PACK (10MG AND 5MG)
ORAL_TABLET | ORAL | 0 refills | Status: AC
  Filled 2023-07-14: qty 74, 30d supply, fill #0

## 2023-07-14 NOTE — Discharge Instructions (Signed)
Information on my medicine - ELIQUIS (apixaban)  This medication education was reviewed with me or my healthcare representative as part of my discharge preparation.   Why was Eliquis prescribed for you? Eliquis was prescribed to treat blood clots that may have been found in the veins of your legs (deep vein thrombosis) or in your lungs (pulmonary embolism) and to reduce the risk of them occurring again.  What do You need to know about Eliquis ? The starting dose is 10 mg (two 5 mg tablets) taken TWICE daily for the FIRST SEVEN (7) DAYS, then the dose is reduced to ONE 5 mg tablet taken TWICE daily.  Eliquis may be taken with or without food.   Try to take the dose about the same time in the morning and in the evening. If you have difficulty swallowing the tablet whole please discuss with your pharmacist how to take the medication safely.  Take Eliquis exactly as prescribed and DO NOT stop taking Eliquis without talking to the doctor who prescribed the medication.  Stopping may increase your risk of developing a new blood clot.  Refill your prescription before you run out.  After discharge, you should have regular check-up appointments with your healthcare provider that is prescribing your Eliquis.    What do you do if you miss a dose? If a dose of ELIQUIS is not taken at the scheduled time, take it as soon as possible on the same day and twice-daily administration should be resumed. The dose should not be doubled to make up for a missed dose.  Important Safety Information A possible side effect of Eliquis is bleeding. You should call your healthcare provider right away if you experience any of the following: Bleeding from an injury or your nose that does not stop. Unusual colored urine (red or dark brown) or unusual colored stools (red or black). Unusual bruising for unknown reasons. A serious fall or if you hit your head (even if there is no bleeding).  Some medicines may  interact with Eliquis and might increase your risk of bleeding or clotting while on Eliquis. To help avoid this, consult your healthcare provider or pharmacist prior to using any new prescription or non-prescription medications, including herbals, vitamins, non-steroidal anti-inflammatory drugs (NSAIDs) and supplements.  This website has more information on Eliquis (apixaban): http://www.eliquis.com/eliquis/home  

## 2023-07-14 NOTE — Discharge Summary (Signed)
Physician Discharge Summary  Wendy Watts HYQ:[id number] DOB: 1946-08-11 DOA: 07/13/2023  PCP: Daisy Floro, MD  Admit date: 07/13/2023 Discharge date: 07/14/2023  Admitted From: home Disposition:  home  Recommendations for Outpatient Follow-up:  Follow up with PCP in 1-2 weeks  Home Health: PT, OT, SW Equipment/Devices: walker  Discharge Condition: stable CODE STATUS: Full code  HPI: Per admitting MD, Wendy Watts is a 77 y.o. female with medical history significant of dementia, prior DVT, history of previous hip fracture, anxiety disorder, hyperlipidemia and vertigo who was brought in from home with left lower extremity swelling and inability to put weight on her leg.  Patient was seen last night in the ER with diagnosis of DVT.  Started on Eliquis which she is to has so far tolerated.  Patient was being prepped to go home where she is recently had home health set up but has not began coming out.  She however could not put adequate weight on the leg and she was also becoming short of breath.  CT angiogram of the chest showed pulmonary embolism but no right heart strain.  Patient has been to a skilled facility twice before.  At this point there is fears of fall as patient was unable to walk.  With her dementia she is also at high risk for fall.  She is being admitted to the hospital for PT OT evaluation and possible placement.   Hospital Course / Discharge diagnoses: Principal Problem:   Generalized weakness Active Problems:   Dementia with behavioral disturbance (HCC)   Cardiac pacemaker in situ   DVT (deep venous thrombosis) (HCC)   Acute pulmonary embolism (HCC)   Principal problem Acute PE/DVT -likely provoked, patient was recently hospitalized and probably related to that.  CT angiogram on admission with PEs within the segmental branches of the left lower lobe.  There was no evidence of right heart strain, she was not hypoxic nor hypotensive.  Lower extremity  ultrasound showed acute DVT as well as age-indeterminate DVT on the left side.  She was placed on Eliquis, tolerating it well, and will continue likely lifelong given prior history of DVT  Active problems Advanced dementia-continue home medications.  Home health therapies will be maximized Anxiety-continue home medications Symptomatic sinus bradycardia-status post pacemaker September 2022  Sepsis ruled out   Discharge Instructions   Allergies as of 07/14/2023       Reactions   Allegra [fexofenadine Hcl] Other (See Comments)   "takes me to another world"   Ativan [lorazepam] Other (See Comments)   Hallucinations   Azithromycin Nausea And Vomiting, Hives   Cephalosporins    Other Reaction(s): unknown   Cetirizine Other (See Comments), Nausea And Vomiting   "takes me to another world"   Ciprofloxacin    Other Reaction(s): insomnia and hallucinations   Codeine Hives   Doxycycline Hyclate Nausea Only   Fexofenadine Nausea And Vomiting   Hydrocodone-acetaminophen    Other Reaction(s): hallucinations   Lidocaine    Other Reaction(s): unknown   Penicillin G Hives   Penicillins Hives   Has patient had a PCN reaction causing immediate rash, facial/tongue/throat swelling, SOB or lightheadedness with hypotension: no Has patient had a PCN reaction causing severe rash involving mucus membranes or skin necrosis: no Has patient had a PCN reaction that required hospitalization: no Has patient had a PCN reaction occurring within the last 10 years: no If all of the above answers are "NO", then may proceed with Cephalosporin use.  Sertraline    Other Reaction(s): hallucinations   Sulfa Antibiotics Hives   Trazodone    Other Reaction(s): Dizzy   Trazodone And Nefazodone Nausea Only        Medication List     TAKE these medications    ALPRAZolam 0.25 MG tablet Commonly known as: XANAX Take 0.25 mg by mouth 2 (two) times daily as needed.   Apixaban Starter Pack (10mg  and  5mg ) Commonly known as: ELIQUIS STARTER PACK Take as directed on package: start with two-5mg  tablets twice daily for 7 days. On day 8, switch to one-5mg  tablet twice daily.   buPROPion 150 MG 24 hr tablet Commonly known as: WELLBUTRIN XL Take 150 mg by mouth daily.   busPIRone 15 MG tablet Commonly known as: BUSPAR Take 15 mg by mouth 2 (two) times daily.   donepezil 10 MG tablet Commonly known as: ARICEPT TAKE 1 TABLET(10 MG) BY MOUTH AT BEDTIME   memantine 10 MG tablet Commonly known as: Namenda Take 1 tablet (10 mg total) by mouth 2 (two) times daily.        Follow-up Information     [facility] (Formerly Follow up.   Why: Iantha Fallen will provide Home Health services.  They will call you in the next 24-48 hours to restart services. Contact information: [address] [place] [address] [phone]                 Consultations: none  Procedures/Studies:  CT Angio Chest PE W and/or Wo Contrast  Result Date: 07/13/2023 CLINICAL DATA:  Left leg swelling. Left lower extremity DVT. Pulmonary embolism suspected, high probability. EXAM: CT ANGIOGRAPHY CHEST WITH CONTRAST TECHNIQUE: Multidetector CT imaging of the chest was performed using the standard protocol during bolus administration of intravenous contrast. Multiplanar CT image reconstructions and MIPs were obtained to evaluate the vascular anatomy. RADIATION DOSE REDUCTION: This exam was performed according to the departmental dose-optimization program which includes automated exposure control, adjustment of the mA and/or kV according to patient size and/or use of iterative reconstruction technique. CONTRAST:  75mL OMNIPAQUE IOHEXOL 350 MG/ML SOLN COMPARISON:  One-view chest x-ray 06/26/2023 FINDINGS: Cardiovascular: The heart size is normal. The aortic arch and great vessel origins are within normal limits. No dissection is present. Pulmonary artery opacification is excellent.  Filling defects are present within segmental branches of the left lower lobe. Pulmonary artery size is normal. There is no evidence for right heart strain. Mediastinum/Nodes: No enlarged mediastinal, hilar, or axillary lymph nodes. Thyroid gland, trachea, and esophagus demonstrate no significant findings. Heterogeneous right lobe of the thyroid is present. A dominant nodule measures 3.1 x 2.0 cm. Lungs/Pleura: Mild dependent atelectasis is present both lungs. A 3 mm nodule is present the left lung apex on image 33 of series 6. No other nodule or mass lesion is present. No significant pleural effusion or pneumothorax is present. Upper Abdomen: Limited imaging the abdomen is unremarkable. There is no significant adenopathy. No solid organ lesions are present. Musculoskeletal: No chest wall abnormality. No acute or significant osseous findings. Review of the MIP images confirms the above findings. IMPRESSION: 1. Pulmonary emboli within segmental branches of the left lower lobe. 2. No evidence for right heart strain. 3. 3 mm nodule at the left lung apex. No follow-up needed if patient is low-risk.This recommendation follows the consensus statement: Guidelines for Management of Incidental Pulmonary Nodules Detected on CT Images: From the Fleischner Society 2017; Radiology 2017; 284:228-243. 4. Heterogeneous right lobe of the thyroid  with a dominant nodule measuring 3.1 x 2.0 cm. Recommend thyroid US (ref: J Am Coll Radiol. 2015 Feb;12(2): 143-50). These results were called by telephone at the time of interpretation on 07/13/2023 at 6:17 pm to provider Dr. Doran Durand, who verbally acknowledged these results. Electronically Signed   By: Marin Roberts M.D.   On: 07/13/2023 18:18   LE Venous  Result Date: 07/13/2023  Lower Venous DVT Study Patient Name:  Wendy Watts  Date of Exam:   07/13/2023 Medical Rec #: [id number]      Accession #:    [id number] Date of Birth: 01-23-1946       Patient Gender: F Patient Age:    42 years Exam Location:  [facility] Procedure:      VAS [name] LOWER EXTREMITY VENOUS (DVT) Referring Phys: Lyn Hollingshead SCHUTT --------------------------------------------------------------------------------  Indications: Pain, and Swelling.  Risk Factors: DVT 04/27/21 extensive Left DVT in the CFV, FV, SFJ, FV, PFV, Popliteal, peroneal, posterial tibial, gastroc and SVT in the proximal GSV. Placed on Eliquis at time of DVT, however, has not been on anticoagulants in a year. . Limitations: Patient compliance/dementia. Comparison Study: Prior Left LEV 04/27/21 Performing Technologist: Sherren Kerns RVS  Examination Guidelines: A complete evaluation includes B-mode imaging, spectral Doppler, color Doppler, and power Doppler as needed of all accessible portions of each vessel. Bilateral testing is considered an integral part of a complete examination. Limited examinations for reoccurring indications may be performed as noted. The reflux portion of the exam is performed with the patient in reverse Trendelenburg.  +-----+---------------+---------+-----------+----------+--------------+ RIGHTCompressibilityPhasicitySpontaneityPropertiesThrombus Aging +-----+---------------+---------+-----------+----------+--------------+ CFV  Full           Yes      Yes                                 +-----+---------------+---------+-----------+----------+--------------+   +---------+---------------+---------+-----------+----------+------------------+ LEFT     CompressibilityPhasicitySpontaneityPropertiesThrombus Aging     +---------+---------------+---------+-----------+----------+------------------+ CFV      None           No       No                   Acute              +---------+---------------+---------+-----------+----------+------------------+ SFJ      None           No       No                   Acute              +---------+---------------+---------+-----------+----------+------------------+  FV Prox  None           No       No                   Age Indeterminate  +---------+---------------+---------+-----------+----------+------------------+ FV Mid   None           No       No                   Age Indeterminate  +---------+---------------+---------+-----------+----------+------------------+ FV DistalNone           No       No                   Age Indeterminate  +---------+---------------+---------+-----------+----------+------------------+ PFV      None  No       No                   Age Indeterminate  +---------+---------------+---------+-----------+----------+------------------+ POP      None           No       No                   Age Indeterminate  +---------+---------------+---------+-----------+----------+------------------+ PTV      None           No       No                   Age Indeterminate  +---------+---------------+---------+-----------+----------+------------------+ PERO     None           No       No                   Age Indeterminate  +---------+---------------+---------+-----------+----------+------------------+ Gastroc                                               Not well                                                                 visualized         +---------+---------------+---------+-----------+----------+------------------+ GSV      None           No       No                   acute proximal                                                           portion            +---------+---------------+---------+-----------+----------+------------------+ EIV                     No       No                   Acute              +---------+---------------+---------+-----------+----------+------------------+ Unable to visualize CIV or IVC secondary to patient's altered mental status    Summary: LEFT: - Findings consistent with acute deep vein thrombosis involving the left common femoral vein,  SF junction, and EIV. - Findings consistent with acute superficial vein thrombosis involving the proximal left great saphenous vein.  - Findings consistent with age indeterminate deep vein thrombosis involving the left femoral vein, left proximal profunda vein, left popliteal vein, left posterior tibial veins, and left peroneal veins.  - No cystic structure found in the popliteal fossa.  RIGHT: - No evidence of common femoral vein obstruction.   *See table(s) above for measurements and observations. Electronically signed by Sherald Hess MD on 07/13/2023 at 12:37:25 PM.    Final    DG  Ankle Complete Left  Result Date: 07/12/2023 CLINICAL DATA:  Left foot swelling EXAM: LEFT ANKLE COMPLETE - 3+ VIEW COMPARISON:  None Available. FINDINGS: Normal alignment. No acute fracture or dislocation. Ankle mortise is aligned. No ankle effusion. Mild midfoot degenerative arthritis. There is extensive bimalleolar soft tissue swelling. IMPRESSION: 1. Extensive bimalleolar soft tissue swelling. No acute fracture or dislocation. Electronically Signed   By: Helyn Numbers M.D.   On: 07/12/2023 21:45   DG Foot Complete Left  Result Date: 07/12/2023 CLINICAL DATA:  Left foot swelling EXAM: LEFT FOOT - COMPLETE 3+ VIEW COMPARISON:  None Available. FINDINGS: Surgical changes of probable bunionectomy are identified with a residual K-wire within the first metatarsal head. Residual moderate hallux valgus deformity. No acute fracture or dislocation. Osseous structures are diffusely osteopenic. Mild degenerate arthritis of the first MTP joint. Mild midfoot degenerative arthritis. There is moderate to severe soft tissue swelling of the midfoot and hindfoot. Suspected ulcer within the plantar, lateral hindfoot. IMPRESSION: 1. Moderate to severe soft tissue swelling. Possible plantar, hindfoot ulcer. No acute fracture or dislocation. 2. Surgical changes of probable bunionectomy. Electronically Signed   By: Helyn Numbers M.D.   On:  07/12/2023 21:44   DG Chest Portable 1 View  Result Date: 06/26/2023 CLINICAL DATA:  Diarrhea and altered mental status. EXAM: PORTABLE CHEST 1 VIEW COMPARISON:  May 11, 2021 FINDINGS: There is stable single lead ventricular pacer positioning. The heart size and mediastinal contours are within normal limits. Mild atelectasis and/or infiltrate is seen within the lateral aspect of the left lung base. A small left pleural effusion is noted. No pneumothorax is identified. Multilevel degenerative changes are seen throughout the thoracic spine. IMPRESSION: 1. Mild left basilar atelectasis and/or infiltrate. 2. Small left pleural effusion. Electronically Signed   By: Aram Candela M.D.   On: 06/26/2023 20:39     Subjective: -Unable to do ROS given underlying advanced dementia  Discharge Exam: BP 108/62 (BP Location: Right Arm)   Pulse 89   Temp 97.9 F (36.6 C)   Resp 16   Ht 5\' 6"  (1.676 m)   Wt 60.8 kg   SpO2 96%   BMI 21.63 kg/m   General: Pt is alert, awake, not in acute distress Cardiovascular: RRR, S1/S2 +, no rubs, no gallops Respiratory: CTA bilaterally, no wheezing, no rhonchi Abdominal: Soft, NT, ND, bowel sounds + Extremities: no edema, no cyanosis   The results of significant diagnostics from this hospitalization (including imaging, microbiology, ancillary and laboratory) are listed below for reference.     Microbiology: No results found for this or any previous visit (from the past 240 hour(s)).   Labs: Basic Metabolic Panel: Recent Labs  Lab 07/12/23 2023 07/13/23 1304 07/14/23 0109  NA 134* 138 135  K 4.4 4.2 3.6  CL 102 105 103  CO2 22 22 23   GLUCOSE 105* 91 87  BUN 12 10 9   CREATININE 1.27* 1.13* 1.14*  CALCIUM 10.0 9.6 9.4   Liver Function Tests: Recent Labs  Lab 07/13/23 1304 07/14/23 0109  AST 13* 12*  ALT 10 9  ALKPHOS 88 82  BILITOT 0.9 0.8  PROT RESULTS UNAVAILABLE DUE TO INTERFERING SUBSTANCE RESULTS UNAVAILABLE DUE TO  INTERFERING SUBSTANCE  ALBUMIN 3.0* 2.6*   CBC: Recent Labs  Lab 07/12/23 2023 07/13/23 1304 07/14/23 0109  WBC 12.7* 9.2 7.3  NEUTROABS 10.6*  --   --   HGB 13.5 12.7 12.1  HCT 40.8 38.6 36.5  MCV 91.7 91.9 90.8  PLT 249  272 239   CBG: No results for input(s): "GLUCAP" in the last 168 hours. Hgb A1c No results for input(s): "HGBA1C" in the last 72 hours. Lipid Profile No results for input(s): "CHOL", "HDL", "LDLCALC", "TRIG", "CHOLHDL", "LDLDIRECT" in the last 72 hours. Thyroid function studies No results for input(s): "TSH", "T4TOTAL", "T3FREE", "THYROIDAB" in the last 72 hours.  Invalid input(s): "FREET3" Urinalysis    Component Value Date/Time   COLORURINE YELLOW 06/26/2023 1831   APPEARANCEUR HAZY (A) 06/26/2023 1831   LABSPEC 1.035 (H) 06/26/2023 1831   PHURINE 6.0 06/26/2023 1831   GLUCOSEU NEGATIVE 06/26/2023 1831   HGBUR TRACE (A) 06/26/2023 1831   BILIRUBINUR NEGATIVE 06/26/2023 1831   KETONESUR 40 (A) 06/26/2023 1831   PROTEINUR 30 (A) 06/26/2023 1831   UROBILINOGEN 0.2 02/01/2010 1201   NITRITE POSITIVE (A) 06/26/2023 1831   LEUKOCYTESUR LARGE (A) 06/26/2023 1831    FURTHER DISCHARGE INSTRUCTIONS:   Get Medicines reviewed and adjusted: Please take all your medications with you for your next visit with your Primary MD   Laboratory/radiological data: Please request your Primary MD to go over all hospital tests and procedure/radiological results at the follow up, please ask your Primary MD to get all Hospital records sent to his/her office.   In some cases, they will be blood work, cultures and biopsy results pending at the time of your discharge. Please request that your primary care M.D. goes through all the records of your hospital data and follows up on these results.   Also Note the following: If you experience worsening of your admission symptoms, develop shortness of breath, life threatening emergency, suicidal or homicidal thoughts you must seek  medical attention immediately by calling 911 or calling your MD immediately  if symptoms less severe.   You must read complete instructions/literature along with all the possible adverse reactions/side effects for all the Medicines you take and that have been prescribed to you. Take any new Medicines after you have completely understood and accpet all the possible adverse reactions/side effects.    Do not drive when taking Pain medications or sleeping medications (Benzodaizepines)   Do not take more than prescribed Pain, Sleep and Anxiety Medications. It is not advisable to combine anxiety,sleep and pain medications without talking with your primary care practitioner   Special Instructions: If you have smoked or chewed Tobacco  in the last 2 yrs please stop smoking, stop any regular Alcohol  and or any Recreational drug use.   Wear Seat belts while driving.   Please note: You were cared for by a hospitalist during your hospital stay. Once you are discharged, your primary care physician will handle any further medical issues. Please note that NO REFILLS for any discharge medications will be authorized once you are discharged, as it is imperative that you return to your primary care physician (or establish a relationship with a primary care physician if you do not have one) for your post hospital discharge needs so that they can reassess your need for medications and monitor your lab values.  Time coordinating discharge: 35 minutes  SIGNED:  Pamella Pert, MD, PhD 07/14/2023, 11:15 AM

## 2023-07-14 NOTE — Care Management Obs Status (Cosign Needed)
MEDICARE OBSERVATION STATUS NOTIFICATION   Patient Details  Name: ASTI FAVREAU MRN: [id number] Date of Birth: [birth year]   Medicare Observation Status Notification Given:  Yes    Janae Bridgeman, RN 07/14/2023, 9:28 AM

## 2023-07-14 NOTE — Progress Notes (Signed)
    Durable Medical Equipment  (From admission, onward)           Start     Ordered   07/14/23 1115  For home use only DME lightweight manual wheelchair with seat cushion  Once       Comments: Patient suffers from pulmonary embolism, generalized weakness which impairs their ability to perform daily activities like toileting in the home.  A walker will not resolve  issue with performing activities of daily living. A wheelchair will allow patient to safely perform daily activities. Patient is not able to propel themselves in the home using a standard weight wheelchair due to general weakness. Patient can self propel in the lightweight wheelchair. Length of need 12 months . Accessories: elevating leg rests (ELRs), wheel locks, extensions and anti-tippers.  Please provide a lightweight wheelchair.   07/14/23 1115

## 2023-07-14 NOTE — Progress Notes (Addendum)
Transition of Care [facility]) - Inpatient Brief Assessment   Patient Details  Name: Wendy Watts MRN: [id number] Date of Birth: 1945-11-18  Transition of Care [facility]) CM/SW Contact:    Janae Bridgeman, RN Phone Number: 07/14/2023, 10:17 AM   Clinical Narrative: CM spoke with Utilization RN this morning and patient was changed to Observation status.  No family available at the bedside at this time.  I called and spoke with the patient's daughter by phone and I provided the daughter with Medicare Observation notice by phone and document provided at the bedside.  Code 44 completed.  Patient has RW at home and the daughter plans to order a Rolator from [place].  3:1 present at the home at the home per daughter.  Patient is active with [facility] for PT.  HH orders placed to be co-signed by the MD.  Iantha Fallen was notified of patient's potential discharge to home to restart services. MSW was added to the [facility] orders.  No other TOC needs at this time.  07/14/23 - Spoke with the daughter at the bedside and the patient's family plans to take her home by car today.  Lightweight WC is needed.  Daughter was agreeable to order through [employer].  Orders placed and [employer] requested to deliver to the patient's bedside today.  Daughter states that she has a Ramp at the home - otherwise will order one from [employer]   Transition of Care Asessment: Insurance and Status: (P) Insurance coverage has been reviewed Patient has primary care physician: (P) Yes Home environment has been reviewed: (P) from home with spouse Prior level of function:: (P) assistance from family due to patient's Dementia Prior/Current Home Services: (P) Current home services (Active with [facility] for PT) Social Determinants of Health Reivew: (P) SDOH reviewed interventions complete Readmission risk has been reviewed: (P) Yes Transition of care needs: (P) transition of care needs identified, TOC will continue to follow

## 2023-07-14 NOTE — Progress Notes (Addendum)
Lab called and informed nurse that they were unable to get the result for patient's total protein due to "interfering substance". Lab informed RN that they ran the test 3 times. Wendy Pigg, DO was notified. No new order.   Brittany Osier

## 2023-07-14 NOTE — Plan of Care (Signed)
  Problem: Clinical Measurements: Goal: Will remain free from infection Outcome: Progressing Goal: Diagnostic test results will improve Outcome: Progressing Goal: Respiratory complications will improve Outcome: Progressing Goal: Cardiovascular complication will be avoided Outcome: Progressing   Problem: Activity: Goal: Risk for activity intolerance will decrease Outcome: Progressing   Problem: Nutrition: Goal: Adequate nutrition will be maintained Outcome: Progressing   Problem: Coping: Goal: Level of anxiety will decrease Outcome: Progressing   Problem: Elimination: Goal: Will not experience complications related to bowel motility Outcome: Progressing Goal: Will not experience complications related to urinary retention Outcome: Progressing   Problem: Pain Management: Goal: General experience of comfort will improve Outcome: Progressing   Problem: Safety: Goal: Ability to remain free from injury will improve Outcome: Progressing   Problem: Skin Integrity: Goal: Risk for impaired skin integrity will decrease Outcome: Progressing   Problem: Education: Goal: Knowledge of General Education information will improve Description: Including pain rating scale, medication(s)/side effects and non-pharmacologic comfort measures Outcome: Not Progressing   Problem: Health Behavior/Discharge Planning: Goal: Ability to manage health-related needs will improve Outcome: Not Progressing   Problem: Clinical Measurements: Goal: Ability to maintain clinical measurements within normal limits will improve Outcome: Not Progressing  Patient has alzheimer dementia and is only oriented to self. Will continue to reinforce education.

## 2023-07-14 NOTE — Care Management CC44 (Cosign Needed)
Condition Code 44 Documentation Completed  Patient Details  Name: ANALIYAH BOULTON MRN: [id number] Date of Birth: 1946-05-10   Condition Code 44 given:  Yes Patient signature on Condition Code 44 notice:  Yes Documentation of 2 MD's agreement:  Yes Code 44 added to claim:  Yes    Janae Bridgeman, RN 07/14/2023, 9:28 AM

## 2023-07-14 NOTE — Evaluation (Signed)
Physical Therapy Evaluation Patient Details Name: Wendy Watts MRN: [id number] DOB: Dec 13, 1945 Today's Date: 07/14/2023  History of Present Illness  Pt is a 77 y/o F presenting to ED on 11/28 with 1 acute pulmonary embolism, and LLE DVT. Recent admission and d/c for UTI. PMH includes advanced dementia, HLD, PPM.  Clinical Impression  Pt admitted with above diagnosis. Family present and very supportive. They provide history and current concerns. Patient only received one HHPT visit before having to return to hospital with DVT/PE. Husband has been assisting pt up into a recliner at home, having her sit upright. Family participated with bed mobility and transfer training today. Pt agitated limiting ability to walk, suspect some LLE pain from DVT as well however she was able to bear weight without buckling. Pt sits down shortly after standing each time. Ultimately decided with family, a wheelchair would be safest option for mobility if returning home. Discussed using stand pivot transfer with gait belt (provided) and use of BSC instead of going into the bathroom, until pt moving with less resistance. All questions answered. Pt currently with functional limitations due to the deficits listed below (see PT Problem List). Pt will benefit from acute skilled PT to increase their independence and safety with mobility to allow discharge.           If plan is discharge home, recommend the following: A lot of help with walking and/or transfers;A lot of help with bathing/dressing/bathroom;Assistance with cooking/housework;Assistance with feeding;Direct supervision/assist for medications management;Direct supervision/assist for financial management;Assist for transportation;Help with stairs or ramp for entrance;Supervision due to cognitive status   Can travel by private vehicle        Equipment Recommendations Wheelchair (measurements PT) (light weight w/c)  Recommendations for Other Services        Functional Status Assessment Patient has had a recent decline in their functional status and demonstrates the ability to make significant improvements in function in a reasonable and predictable amount of time.     Precautions / Restrictions Precautions Precautions: Fall Restrictions Weight Bearing Restrictions: No      Mobility  Bed Mobility Overal bed mobility: Needs Assistance Bed Mobility: Supine to Sit, Sit to Supine     Supine to sit: Min assist Sit to supine: Min assist   General bed mobility comments: Min assist to facilitate and redirect due to confusion/agitation with guided movements to EOB.    Transfers Overall transfer level: Needs assistance Equipment used: 2 person hand held assist, Rollator (4 wheels) Transfers: Sit to/from Stand Sit to Stand: Mod assist, +2 physical assistance, +2 safety/equipment           General transfer comment: Mod assist for sit to stand multiple times from EOB. Pt quickly becomes agitated and sits back down. Requires redirection and hand over hand guidance to grasp rollator however this is shortly sustained. Able to bear weight through LEs without buckling but suspect it may still be painful preventing pt from ambualting at this point. +2 with gait belt to assist with balance and facilitation from family, participating throughout session.    Ambulation/Gait               General Gait Details: Unable due to agitation  Stairs            Wheelchair Mobility     Tilt Bed    Modified Rankin (Stroke Patients Only)       Balance Overall balance assessment: Needs assistance Sitting-balance support: No upper extremity supported, Feet supported Sitting  balance-Leahy Scale: Fair Sitting balance - Comments: Intermittent min assist as she tries to lie back down Postural control: Posterior lean Standing balance support: Bilateral upper extremity supported, Reliant on assistive device for balance Standing balance-Leahy  Scale: Poor Standing balance comment: reliance on external support                             Pertinent Vitals/Pain Pain Assessment Pain Assessment: PAINAD Breathing: normal Negative Vocalization: occasional moan/groan, low speech, negative/disapproving quality Facial Expression: smiling or inexpressive Body Language: relaxed Consolability: no need to console PAINAD Score: 1 Pain Intervention(s): Monitored during session, Repositioned    Home Living Family/patient expects to be discharged to:: Private residence Living Arrangements: Spouse/significant other;Children Available Help at Discharge: Family;Available 24 hours/day Type of Home: House Home Access: Stairs to enter Entrance Stairs-Rails: None Entrance Stairs-Number of Steps: 2   Home Layout: One level Home Equipment: Agricultural consultant (2 wheels);Shower seat;Toilet riser Additional Comments: daughter provides history    Prior Function Prior Level of Function : Needs assist             Mobility Comments: Husband has been assisting with transfer, sitting up in recliner since d/c. Agitated and resistive to mobilizing ADLs Comments: pt requires assistance for all ADL and IADLs     Extremity/Trunk Assessment   Upper Extremity Assessment Upper Extremity Assessment: Defer to OT evaluation    Lower Extremity Assessment Lower Extremity Assessment: Difficult to assess due to impaired cognition    Cervical / Trunk Assessment Cervical / Trunk Assessment: Kyphotic  Communication   Communication Communication: Hearing impairment Cueing Techniques: Verbal cues;Tactile cues  Cognition Arousal: Lethargic Behavior During Therapy: Agitated Overall Cognitive Status: History of cognitive impairments - at baseline                                 General Comments: Multimodal cues to facilitate desired movements.        General Comments General comments (skin integrity, edema, etc.): Spoke at  length with pt husband and daughter. They prefer to take pt home and we believe a w/c would be helpful rather than the less stable rollator for transferring out of bed with a pivot technique. Reviewed with family and gait belt provided. Discussed being out of bed during the day, orienting upright to prevent secondary complications.    Exercises     Assessment/Plan    PT Assessment Patient needs continued PT services  PT Problem List Decreased strength;Decreased activity tolerance;Decreased balance;Decreased mobility;Decreased cognition;Decreased knowledge of use of DME;Decreased safety awareness;Decreased knowledge of precautions;Decreased coordination       PT Treatment Interventions DME instruction;Gait training;Stair training;Functional mobility training;Therapeutic activities;Therapeutic exercise;Balance training;Neuromuscular re-education;Cognitive remediation;Patient/family education;Wheelchair mobility training    PT Goals (Current goals can be found in the Care Plan section)  Acute Rehab PT Goals Patient Stated Goal: family wish for pt to return home PT Goal Formulation: With family Time For Goal Achievement: 07/28/23 Potential to Achieve Goals: Fair    Frequency Min 1X/week     Co-evaluation               AM-PAC PT "6 Clicks" Mobility  Outcome Measure Help needed turning from your back to your side while in a flat bed without using bedrails?: A Lot Help needed moving from lying on your back to sitting on the side of a flat bed without using bedrails?: A Lot  Help needed moving to and from a bed to a chair (including a wheelchair)?: A Lot Help needed standing up from a chair using your arms (e.g., wheelchair or bedside chair)?: A Lot Help needed to walk in hospital room?: Total Help needed climbing 3-5 steps with a railing? : Total 6 Click Score: 10    End of Session Equipment Utilized During Treatment: Gait belt Activity Tolerance: Treatment limited secondary to  agitation Patient left: in bed;with call bell/phone within reach;with bed alarm set;with family/visitor present Nurse Communication: Mobility status PT Visit Diagnosis: Muscle weakness (generalized) (M62.81);Unsteadiness on feet (R26.81);Difficulty in walking, not elsewhere classified (R26.2);Other symptoms and signs involving the nervous system (R29.898)    Time: 1040-1105 PT Time Calculation (min) (ACUTE ONLY): 25 min   Charges:   PT Evaluation $PT Eval Moderate Complexity: 1 Mod PT Treatments $Therapeutic Activity: 8-22 mins PT General Charges $$ ACUTE PT VISIT: 1 Visit         Kathlyn Sacramento, PT, DPT [facility]  Rehabilitation Services Physical Therapist Office: [phone] Website: [email or web address]   Berton Mount 07/14/2023, 12:08 PM

## 2023-07-14 NOTE — Progress Notes (Signed)
Patient arrived to the floor and was transferred to the bed. Patient only oriented to self, VS stable and on RA. Patient was placed on tele monitor, tele unit was called and verified. Patient have her call bell within reach, bed alarm initiated. Daughter [name] was called to help with admission questions.  Odin Mariani

## 2023-07-24 DIAGNOSIS — R630 Anorexia: Secondary | ICD-10-CM | POA: Diagnosis not present

## 2023-07-24 DIAGNOSIS — Z09 Encounter for follow-up examination after completed treatment for conditions other than malignant neoplasm: Secondary | ICD-10-CM | POA: Diagnosis not present

## 2023-08-08 ENCOUNTER — Ambulatory Visit (INDEPENDENT_AMBULATORY_CARE_PROVIDER_SITE_OTHER): Payer: PPO

## 2023-08-08 DIAGNOSIS — R001 Bradycardia, unspecified: Secondary | ICD-10-CM | POA: Diagnosis not present

## 2023-08-08 LAB — CUP PACEART REMOTE DEVICE CHECK
Date Time Interrogation Session: 20241224071515
Implantable Lead Connection Status: 753985
Implantable Lead Implant Date: 20220926
Implantable Lead Location: 753860
Implantable Lead Model: 377171
Implantable Lead Serial Number: 8000535066
Implantable Pulse Generator Implant Date: 20220926
Pulse Gen Model: 407157
Pulse Gen Serial Number: 70243330

## 2023-08-13 DIAGNOSIS — I2699 Other pulmonary embolism without acute cor pulmonale: Secondary | ICD-10-CM | POA: Diagnosis not present

## 2023-08-13 DIAGNOSIS — R531 Weakness: Secondary | ICD-10-CM | POA: Diagnosis not present

## 2023-08-30 ENCOUNTER — Other Ambulatory Visit (HOSPITAL_COMMUNITY): Payer: Self-pay

## 2023-09-20 NOTE — Addendum Note (Signed)
Addended by: Geralyn Flash D on: 09/20/2023 12:18 PM   Modules accepted: Orders

## 2023-10-24 DIAGNOSIS — R634 Abnormal weight loss: Secondary | ICD-10-CM | POA: Diagnosis not present

## 2023-10-24 DIAGNOSIS — Z6824 Body mass index (BMI) 24.0-24.9, adult: Secondary | ICD-10-CM | POA: Diagnosis not present

## 2023-10-24 DIAGNOSIS — F411 Generalized anxiety disorder: Secondary | ICD-10-CM | POA: Diagnosis not present

## 2023-10-24 DIAGNOSIS — Z86718 Personal history of other venous thrombosis and embolism: Secondary | ICD-10-CM | POA: Diagnosis not present

## 2023-10-24 DIAGNOSIS — Z87440 Personal history of urinary (tract) infections: Secondary | ICD-10-CM | POA: Diagnosis not present

## 2023-10-24 DIAGNOSIS — L609 Nail disorder, unspecified: Secondary | ICD-10-CM | POA: Diagnosis not present

## 2023-10-24 DIAGNOSIS — G309 Alzheimer's disease, unspecified: Secondary | ICD-10-CM | POA: Diagnosis not present

## 2023-11-01 DIAGNOSIS — N390 Urinary tract infection, site not specified: Secondary | ICD-10-CM | POA: Diagnosis not present

## 2023-11-07 ENCOUNTER — Ambulatory Visit (INDEPENDENT_AMBULATORY_CARE_PROVIDER_SITE_OTHER): Payer: PPO

## 2023-11-07 DIAGNOSIS — R001 Bradycardia, unspecified: Secondary | ICD-10-CM | POA: Diagnosis not present

## 2023-11-07 LAB — CUP PACEART REMOTE DEVICE CHECK
Date Time Interrogation Session: 20250325085645
Implantable Lead Connection Status: 753985
Implantable Lead Implant Date: 20220926
Implantable Lead Location: 753860
Implantable Lead Model: 377171
Implantable Lead Serial Number: 8000535066
Implantable Pulse Generator Implant Date: 20220926
Pulse Gen Model: 407157
Pulse Gen Serial Number: 70243330

## 2023-11-11 ENCOUNTER — Encounter: Payer: Self-pay | Admitting: Cardiology

## 2023-11-11 DIAGNOSIS — R531 Weakness: Secondary | ICD-10-CM | POA: Diagnosis not present

## 2023-11-11 DIAGNOSIS — I2699 Other pulmonary embolism without acute cor pulmonale: Secondary | ICD-10-CM | POA: Diagnosis not present

## 2023-12-10 NOTE — Progress Notes (Unsigned)
  Electrophysiology Office Follow up Visit Note:    Date:  12/11/2023   ID:  Wendy Watts, DOB Jan 03, 1946, MRN [id number]  PCP:  Jimmey Mould, MD  [facility] Cardiologist:  None  [facility] Electrophysiologist:  Boyce Byes, MD    Interval History:     Wendy Watts is a 78 y.o. female who presents for a follow up visit.   She had a pacemaker implanted on May 10, 2021 for pauses.  She also has a history of DVT on Eliquis  and dementia.  She is with family today in clinic.  She has severe dementia and the history today is provided by family.  They report no problems with the pacemaker.      Past medical, surgical, social and family history were reviewed.  ROS:   Please see the history of present illness.    All other systems reviewed and are negative.  EKGs/Labs/Other Studies Reviewed:    The following studies were reviewed today:  December 11, 2023 in clinic device interrogation personally reviewed Battery and lead parameter stable 1 brief HVR episode in December 2024 lasting 4 seconds No programming changes made today        Physical Exam:    VS:  Pulse 74   Ht 5\' 6"  (1.676 m)   Wt 135 lb (61.2 kg)   SpO2 94%   BMI 21.79 kg/m    The patient declines blood pressure measurement during today's clinic appointment.   Wt Readings from Last 3 Encounters:  12/11/23 135 lb (61.2 kg)  07/14/23 134 lb 0.6 oz (60.8 kg)  06/26/23 148 lb 13 oz (67.5 kg)     GEN: no distress.  Elderly CARD: CIED pocket well-healed RESP: No IWOB.       ASSESSMENT:    1. Bradycardia   2. Syncope and collapse   3. Cardiac pacemaker in situ    PLAN:    In order of problems listed above:  #Symptomatic bradycardia #Permanent pacemaker in situ Device functioning appropriately.  Continue remote monitoring  Follow-up 1 year with APP   Signed, Harvie Liner, MD, [facility], [facility] 12/11/2023 3:02 PM    Electrophysiology [facility]

## 2023-12-11 ENCOUNTER — Encounter: Payer: Self-pay | Admitting: Cardiology

## 2023-12-11 ENCOUNTER — Ambulatory Visit: Attending: Cardiology | Admitting: Cardiology

## 2023-12-11 VITALS — HR 74 | Ht 66.0 in | Wt 135.0 lb

## 2023-12-11 DIAGNOSIS — R55 Syncope and collapse: Secondary | ICD-10-CM | POA: Diagnosis not present

## 2023-12-11 DIAGNOSIS — Z950 Presence of cardiac pacemaker: Secondary | ICD-10-CM

## 2023-12-11 DIAGNOSIS — R001 Bradycardia, unspecified: Secondary | ICD-10-CM

## 2023-12-11 LAB — CUP PACEART INCLINIC DEVICE CHECK
Date Time Interrogation Session: 20250428151002
Implantable Lead Connection Status: 753985
Implantable Lead Implant Date: 20220926
Implantable Lead Location: 753860
Implantable Lead Model: 377171
Implantable Lead Serial Number: 8000535066
Implantable Pulse Generator Implant Date: 20220926
Pulse Gen Model: 407157
Pulse Gen Serial Number: 70243330

## 2023-12-11 NOTE — Patient Instructions (Signed)

## 2023-12-12 DIAGNOSIS — R531 Weakness: Secondary | ICD-10-CM | POA: Diagnosis not present

## 2023-12-12 DIAGNOSIS — I2699 Other pulmonary embolism without acute cor pulmonale: Secondary | ICD-10-CM | POA: Diagnosis not present

## 2023-12-22 NOTE — Progress Notes (Signed)
 Remote pacemaker transmission.

## 2024-01-02 DIAGNOSIS — R4586 Emotional lability: Secondary | ICD-10-CM | POA: Diagnosis not present

## 2024-01-11 DIAGNOSIS — I2699 Other pulmonary embolism without acute cor pulmonale: Secondary | ICD-10-CM | POA: Diagnosis not present

## 2024-01-11 DIAGNOSIS — R531 Weakness: Secondary | ICD-10-CM | POA: Diagnosis not present

## 2024-02-06 ENCOUNTER — Ambulatory Visit (INDEPENDENT_AMBULATORY_CARE_PROVIDER_SITE_OTHER): Payer: PPO

## 2024-02-06 DIAGNOSIS — R001 Bradycardia, unspecified: Secondary | ICD-10-CM

## 2024-02-06 LAB — CUP PACEART REMOTE DEVICE CHECK
Date Time Interrogation Session: 20250624100701
Implantable Lead Connection Status: 753985
Implantable Lead Implant Date: 20220926
Implantable Lead Location: 753860
Implantable Lead Model: 377171
Implantable Lead Serial Number: 8000535066
Implantable Pulse Generator Implant Date: 20220926
Pulse Gen Model: 407157
Pulse Gen Serial Number: 70243330

## 2024-02-07 ENCOUNTER — Ambulatory Visit: Payer: Self-pay | Admitting: Cardiology

## 2024-02-11 DIAGNOSIS — I2699 Other pulmonary embolism without acute cor pulmonale: Secondary | ICD-10-CM | POA: Diagnosis not present

## 2024-02-11 DIAGNOSIS — R531 Weakness: Secondary | ICD-10-CM | POA: Diagnosis not present

## 2024-02-23 DIAGNOSIS — R350 Frequency of micturition: Secondary | ICD-10-CM | POA: Diagnosis not present

## 2024-02-29 ENCOUNTER — Telehealth: Payer: Self-pay | Admitting: Family Medicine

## 2024-02-29 NOTE — Telephone Encounter (Signed)
  Patient's daughter reschedule appointment reschedule due to daugther will be on vacation.

## 2024-03-06 ENCOUNTER — Other Ambulatory Visit: Payer: Self-pay | Admitting: *Deleted

## 2024-03-06 MED ORDER — DONEPEZIL HCL 10 MG PO TABS: ORAL_TABLET | ORAL | 1 refills | Status: AC

## 2024-03-06 NOTE — Telephone Encounter (Signed)
 Last seen on 03/08/23 Follow up scheduled on 09/25/24

## 2024-03-07 ENCOUNTER — Ambulatory Visit: Payer: PPO | Admitting: Family Medicine

## 2024-03-12 DIAGNOSIS — I2699 Other pulmonary embolism without acute cor pulmonale: Secondary | ICD-10-CM | POA: Diagnosis not present

## 2024-03-12 DIAGNOSIS — R531 Weakness: Secondary | ICD-10-CM | POA: Diagnosis not present

## 2024-04-12 DIAGNOSIS — R531 Weakness: Secondary | ICD-10-CM | POA: Diagnosis not present

## 2024-04-12 DIAGNOSIS — I2699 Other pulmonary embolism without acute cor pulmonale: Secondary | ICD-10-CM | POA: Diagnosis not present

## 2024-04-23 DIAGNOSIS — Z6823 Body mass index (BMI) 23.0-23.9, adult: Secondary | ICD-10-CM | POA: Diagnosis not present

## 2024-04-23 DIAGNOSIS — Z86718 Personal history of other venous thrombosis and embolism: Secondary | ICD-10-CM | POA: Diagnosis not present

## 2024-04-23 DIAGNOSIS — F418 Other specified anxiety disorders: Secondary | ICD-10-CM | POA: Diagnosis not present

## 2024-04-23 DIAGNOSIS — E559 Vitamin D deficiency, unspecified: Secondary | ICD-10-CM | POA: Diagnosis not present

## 2024-04-23 DIAGNOSIS — Z87440 Personal history of urinary (tract) infections: Secondary | ICD-10-CM | POA: Diagnosis not present

## 2024-04-23 DIAGNOSIS — G309 Alzheimer's disease, unspecified: Secondary | ICD-10-CM | POA: Diagnosis not present

## 2024-04-23 DIAGNOSIS — R451 Restlessness and agitation: Secondary | ICD-10-CM | POA: Diagnosis not present

## 2024-04-23 DIAGNOSIS — E785 Hyperlipidemia, unspecified: Secondary | ICD-10-CM | POA: Diagnosis not present

## 2024-05-07 ENCOUNTER — Ambulatory Visit (INDEPENDENT_AMBULATORY_CARE_PROVIDER_SITE_OTHER): Payer: PPO

## 2024-05-07 DIAGNOSIS — R001 Bradycardia, unspecified: Secondary | ICD-10-CM

## 2024-05-08 ENCOUNTER — Ambulatory Visit: Payer: Self-pay | Admitting: Cardiology

## 2024-05-08 LAB — CUP PACEART REMOTE DEVICE CHECK
Battery Voltage: 85
Date Time Interrogation Session: 20250923095415
Implantable Lead Connection Status: 753985
Implantable Lead Implant Date: 20220926
Implantable Lead Location: 753860
Implantable Lead Model: 377171
Implantable Lead Serial Number: 8000535066
Implantable Pulse Generator Implant Date: 20220926
Pulse Gen Model: 407157
Pulse Gen Serial Number: 70243330

## 2024-05-08 NOTE — Progress Notes (Signed)
 Remote PPM Transmission

## 2024-05-13 ENCOUNTER — Telehealth: Payer: Self-pay | Admitting: Family Medicine

## 2024-05-13 ENCOUNTER — Encounter: Payer: Self-pay | Admitting: Family Medicine

## 2024-05-13 DIAGNOSIS — I2699 Other pulmonary embolism without acute cor pulmonale: Secondary | ICD-10-CM | POA: Diagnosis not present

## 2024-05-13 DIAGNOSIS — R531 Weakness: Secondary | ICD-10-CM | POA: Diagnosis not present

## 2024-05-13 NOTE — Telephone Encounter (Signed)
 LVM and sent letter in mail informing pt of need to reschedule 09/25/24 appt - NP schedule change

## 2024-05-16 DIAGNOSIS — N390 Urinary tract infection, site not specified: Secondary | ICD-10-CM | POA: Diagnosis not present

## 2024-06-12 DIAGNOSIS — R531 Weakness: Secondary | ICD-10-CM | POA: Diagnosis not present

## 2024-06-12 DIAGNOSIS — I2699 Other pulmonary embolism without acute cor pulmonale: Secondary | ICD-10-CM | POA: Diagnosis not present

## 2024-07-13 DIAGNOSIS — R531 Weakness: Secondary | ICD-10-CM | POA: Diagnosis not present

## 2024-07-13 DIAGNOSIS — I2699 Other pulmonary embolism without acute cor pulmonale: Secondary | ICD-10-CM | POA: Diagnosis not present

## 2024-08-06 ENCOUNTER — Ambulatory Visit: Payer: PPO

## 2024-08-06 DIAGNOSIS — R001 Bradycardia, unspecified: Secondary | ICD-10-CM | POA: Diagnosis not present

## 2024-08-06 LAB — CUP PACEART REMOTE DEVICE CHECK
Date Time Interrogation Session: 20251223145014
Implantable Lead Connection Status: 753985
Implantable Lead Implant Date: 20220926
Implantable Lead Location: 753860
Implantable Lead Model: 377171
Implantable Lead Serial Number: 8000535066
Implantable Pulse Generator Implant Date: 20220926
Pulse Gen Model: 407157
Pulse Gen Serial Number: 70243330

## 2024-08-07 NOTE — Progress Notes (Signed)
 Remote PPM Transmission

## 2024-08-09 ENCOUNTER — Ambulatory Visit: Payer: Self-pay | Admitting: Student in an Organized Health Care Education/Training Program

## 2024-09-25 ENCOUNTER — Ambulatory Visit: Admitting: Family Medicine

## 2024-12-16 ENCOUNTER — Ambulatory Visit: Admitting: Family Medicine
